# Patient Record
Sex: Male | Born: 1985 | Race: Black or African American | Hispanic: No | Marital: Single | Smoking: Former smoker
Health system: Southern US, Community
[De-identification: ages and names within clinical notes are randomized; demographics above are authoritative.]

## PROBLEM LIST (undated history)

## (undated) DIAGNOSIS — Z992 Dependence on renal dialysis: Secondary | ICD-10-CM

## (undated) DIAGNOSIS — J810 Acute pulmonary edema: Secondary | ICD-10-CM

## (undated) DIAGNOSIS — J189 Pneumonia, unspecified organism: Secondary | ICD-10-CM

## (undated) DIAGNOSIS — G9341 Metabolic encephalopathy: Secondary | ICD-10-CM

## (undated) DIAGNOSIS — I10 Essential (primary) hypertension: Secondary | ICD-10-CM

## (undated) DIAGNOSIS — D631 Anemia in chronic kidney disease: Secondary | ICD-10-CM

## (undated) DIAGNOSIS — F329 Major depressive disorder, single episode, unspecified: Secondary | ICD-10-CM

## (undated) DIAGNOSIS — K5790 Diverticulosis of intestine, part unspecified, without perforation or abscess without bleeding: Secondary | ICD-10-CM

## (undated) DIAGNOSIS — N62 Hypertrophy of breast: Secondary | ICD-10-CM

## (undated) DIAGNOSIS — G9340 Encephalopathy, unspecified: Secondary | ICD-10-CM

## (undated) DIAGNOSIS — K219 Gastro-esophageal reflux disease without esophagitis: Secondary | ICD-10-CM

## (undated) DIAGNOSIS — R161 Splenomegaly, not elsewhere classified: Secondary | ICD-10-CM

## (undated) DIAGNOSIS — K449 Diaphragmatic hernia without obstruction or gangrene: Secondary | ICD-10-CM

## (undated) DIAGNOSIS — Z91199 Patient's noncompliance with other medical treatment and regimen due to unspecified reason: Secondary | ICD-10-CM

## (undated) DIAGNOSIS — I509 Heart failure, unspecified: Secondary | ICD-10-CM

## (undated) DIAGNOSIS — Z720 Tobacco use: Secondary | ICD-10-CM

## (undated) DIAGNOSIS — R0600 Dyspnea, unspecified: Secondary | ICD-10-CM

## (undated) DIAGNOSIS — J90 Pleural effusion, not elsewhere classified: Secondary | ICD-10-CM

## (undated) DIAGNOSIS — I5189 Other ill-defined heart diseases: Secondary | ICD-10-CM

## (undated) DIAGNOSIS — F1290 Cannabis use, unspecified, uncomplicated: Secondary | ICD-10-CM

## (undated) DIAGNOSIS — D649 Anemia, unspecified: Secondary | ICD-10-CM

## (undated) DIAGNOSIS — R0609 Other forms of dyspnea: Secondary | ICD-10-CM

## (undated) DIAGNOSIS — J9601 Acute respiratory failure with hypoxia: Secondary | ICD-10-CM

## (undated) DIAGNOSIS — T8859XA Other complications of anesthesia, initial encounter: Secondary | ICD-10-CM

## (undated) DIAGNOSIS — Z9981 Dependence on supplemental oxygen: Secondary | ICD-10-CM

## (undated) DIAGNOSIS — I5030 Unspecified diastolic (congestive) heart failure: Secondary | ICD-10-CM

## (undated) DIAGNOSIS — Z789 Other specified health status: Secondary | ICD-10-CM

## (undated) DIAGNOSIS — N186 End stage renal disease: Secondary | ICD-10-CM

## (undated) HISTORY — DX: Heart failure, unspecified: I50.9

## (undated) HISTORY — PX: AV FISTULA PLACEMENT: SHX1204

---

## 2020-09-16 ENCOUNTER — Emergency Department: Payer: BC Managed Care – PPO

## 2020-09-16 ENCOUNTER — Observation Stay
Admission: EM | Admit: 2020-09-16 | Discharge: 2020-09-16 | Disposition: A | Discharge status: Home / Self Care | Payer: BC Managed Care – PPO | Attending: Internal Medicine | Admitting: Internal Medicine

## 2020-09-16 ENCOUNTER — Encounter: Payer: Self-pay | Admitting: Emergency Medicine

## 2020-09-16 ENCOUNTER — Other Ambulatory Visit: Payer: Self-pay

## 2020-09-16 DIAGNOSIS — J9601 Acute respiratory failure with hypoxia: Secondary | ICD-10-CM

## 2020-09-16 DIAGNOSIS — I161 Hypertensive emergency: Secondary | ICD-10-CM | POA: Diagnosis not present

## 2020-09-16 DIAGNOSIS — R778 Other specified abnormalities of plasma proteins: Secondary | ICD-10-CM

## 2020-09-16 DIAGNOSIS — R7402 Elevation of levels of lactic acid dehydrogenase (LDH): Secondary | ICD-10-CM | POA: Insufficient documentation

## 2020-09-16 DIAGNOSIS — J9621 Acute and chronic respiratory failure with hypoxia: Secondary | ICD-10-CM | POA: Diagnosis not present

## 2020-09-16 DIAGNOSIS — R748 Abnormal levels of other serum enzymes: Secondary | ICD-10-CM | POA: Diagnosis not present

## 2020-09-16 DIAGNOSIS — I10 Essential (primary) hypertension: Secondary | ICD-10-CM

## 2020-09-16 DIAGNOSIS — I120 Hypertensive chronic kidney disease with stage 5 chronic kidney disease or end stage renal disease: Secondary | ICD-10-CM | POA: Insufficient documentation

## 2020-09-16 DIAGNOSIS — N186 End stage renal disease: Secondary | ICD-10-CM | POA: Insufficient documentation

## 2020-09-16 DIAGNOSIS — Z79899 Other long term (current) drug therapy: Secondary | ICD-10-CM | POA: Insufficient documentation

## 2020-09-16 DIAGNOSIS — Z20822 Contact with and (suspected) exposure to covid-19: Secondary | ICD-10-CM | POA: Diagnosis not present

## 2020-09-16 DIAGNOSIS — Z5329 Procedure and treatment not carried out because of patient's decision for other reasons: Secondary | ICD-10-CM | POA: Diagnosis not present

## 2020-09-16 DIAGNOSIS — Z992 Dependence on renal dialysis: Secondary | ICD-10-CM | POA: Insufficient documentation

## 2020-09-16 DIAGNOSIS — F17210 Nicotine dependence, cigarettes, uncomplicated: Secondary | ICD-10-CM | POA: Insufficient documentation

## 2020-09-16 DIAGNOSIS — R7989 Other specified abnormal findings of blood chemistry: Secondary | ICD-10-CM

## 2020-09-16 DIAGNOSIS — J810 Acute pulmonary edema: Secondary | ICD-10-CM | POA: Diagnosis not present

## 2020-09-16 DIAGNOSIS — I248 Other forms of acute ischemic heart disease: Secondary | ICD-10-CM | POA: Diagnosis not present

## 2020-09-16 DIAGNOSIS — R0603 Acute respiratory distress: Secondary | ICD-10-CM | POA: Diagnosis present

## 2020-09-16 HISTORY — DX: Essential (primary) hypertension: I10

## 2020-09-16 HISTORY — DX: Dependence on renal dialysis: N18.6

## 2020-09-16 HISTORY — DX: End stage renal disease: Z99.2

## 2020-09-16 LAB — BLOOD GAS, VENOUS
Acid-base deficit: 4.7 mmol/L — ABNORMAL HIGH (ref 0.0–2.0)
Bicarbonate: 22.1 mmol/L (ref 20.0–28.0)
Delivery systems: POSITIVE
FIO2: 100
O2 Saturation: 81.3 %
Patient temperature: 37
pCO2, Ven: 47 mmHg (ref 44.0–60.0)
pH, Ven: 7.28 (ref 7.250–7.430)
pO2, Ven: 52 mmHg — ABNORMAL HIGH (ref 32.0–45.0)

## 2020-09-16 LAB — COMPREHENSIVE METABOLIC PANEL
ALT: 11 U/L (ref 0–44)
AST: 21 U/L (ref 15–41)
Albumin: 4.1 g/dL (ref 3.5–5.0)
Alkaline Phosphatase: 54 U/L (ref 38–126)
Anion gap: 17 — ABNORMAL HIGH (ref 5–15)
BUN: 64 mg/dL — ABNORMAL HIGH (ref 6–20)
CO2: 22 mmol/L (ref 22–32)
Calcium: 8.5 mg/dL — ABNORMAL LOW (ref 8.9–10.3)
Chloride: 101 mmol/L (ref 98–111)
Creatinine, Ser: 14.23 mg/dL — ABNORMAL HIGH (ref 0.61–1.24)
GFR, Estimated: 4 mL/min — ABNORMAL LOW (ref >60)
Glucose, Bld: 105 mg/dL — ABNORMAL HIGH (ref 70–99)
Potassium: 4.3 mmol/L (ref 3.5–5.1)
Sodium: 140 mmol/L (ref 135–145)
Total Bilirubin: 0.9 mg/dL (ref 0.3–1.2)
Total Protein: 8.3 g/dL — ABNORMAL HIGH (ref 6.5–8.1)

## 2020-09-16 LAB — CBC WITH DIFFERENTIAL/PLATELET
Abs Immature Granulocytes: 0.05 10*3/uL (ref 0.00–0.07)
Basophils Absolute: 0.1 10*3/uL (ref 0.0–0.1)
Basophils Relative: 1 %
Eosinophils Absolute: 0.4 10*3/uL (ref 0.0–0.5)
Eosinophils Relative: 3 %
HCT: 37.8 % — ABNORMAL LOW (ref 39.0–52.0)
Hemoglobin: 12.2 g/dL — ABNORMAL LOW (ref 13.0–17.0)
Immature Granulocytes: 0 %
Lymphocytes Relative: 9 %
Lymphs Abs: 1.3 10*3/uL (ref 0.7–4.0)
MCH: 30.8 pg (ref 26.0–34.0)
MCHC: 32.3 g/dL (ref 30.0–36.0)
MCV: 95.5 fL (ref 80.0–100.0)
Monocytes Absolute: 0.9 10*3/uL (ref 0.1–1.0)
Monocytes Relative: 6 %
Neutro Abs: 11.1 10*3/uL — ABNORMAL HIGH (ref 1.7–7.7)
Neutrophils Relative %: 81 %
Platelets: 241 10*3/uL (ref 150–400)
RBC: 3.96 MIL/uL — ABNORMAL LOW (ref 4.22–5.81)
RDW: 14.3 % (ref 11.5–15.5)
WBC: 13.9 10*3/uL — ABNORMAL HIGH (ref 4.0–10.5)
nRBC: 0 % (ref 0.0–0.2)

## 2020-09-16 LAB — PROTIME-INR
INR: 1.1 (ref 0.8–1.2)
Prothrombin Time: 14.1 seconds (ref 11.4–15.2)

## 2020-09-16 LAB — BRAIN NATRIURETIC PEPTIDE: B Natriuretic Peptide: >4500 pg/mL — ABNORMAL HIGH (ref 0.0–100.0)

## 2020-09-16 LAB — LIPASE, BLOOD: Lipase: 36 U/L (ref 11–51)

## 2020-09-16 LAB — RESP PANEL BY RT-PCR (FLU A&B, COVID) ARPGX2
Influenza A by PCR: NEGATIVE
Influenza B by PCR: NEGATIVE
SARS Coronavirus 2 by RT PCR: NEGATIVE

## 2020-09-16 LAB — TROPONIN I (HIGH SENSITIVITY): Troponin I (High Sensitivity): 196 ng/L (ref <18)

## 2020-09-16 LAB — MAGNESIUM: Magnesium: 2.3 mg/dL (ref 1.7–2.4)

## 2020-09-16 LAB — LACTIC ACID, PLASMA: Lactic Acid, Venous: 2.5 mmol/L (ref 0.5–1.9)

## 2020-09-16 MED ORDER — NITROGLYCERIN IN D5W 200-5 MCG/ML-% IV SOLN
0.0000 ug/min | INTRAVENOUS | Status: DC
  Administered 2020-09-16: 100 ug/min via INTRAVENOUS
  Filled 2020-09-16: qty 250

## 2020-09-16 MED ORDER — AMLODIPINE BESYLATE 5 MG PO TABS: 10.0000 mg | ORAL_TABLET | Freq: Every day | ORAL | Status: DC

## 2020-09-16 MED ORDER — HYDRALAZINE HCL 50 MG PO TABS
100.0000 mg | ORAL_TABLET | Freq: Three times a day (TID) | ORAL | Status: DC
  Administered 2020-09-16: 100 mg via ORAL
  Filled 2020-09-16: qty 2

## 2020-09-16 MED ORDER — HEPARIN SODIUM (PORCINE) 5000 UNIT/ML IJ SOLN: 5000.0000 [IU] | Freq: Three times a day (TID) | INTRAMUSCULAR | Status: DC

## 2020-09-16 MED ORDER — HEPARIN SODIUM (PORCINE) 1000 UNIT/ML IJ SOLN
1000.0000 [IU] | Freq: Once | INTRAMUSCULAR | Status: AC
  Administered 2020-09-16: 1000 [IU] via INTRAVENOUS
  Filled 2020-09-16: qty 1

## 2020-09-16 MED ORDER — CARVEDILOL 6.25 MG PO TABS: 6.2500 mg | ORAL_TABLET | Freq: Two times a day (BID) | ORAL | Status: DC

## 2020-09-16 MED ORDER — ACETAMINOPHEN 650 MG RE SUPP: 650.0000 mg | Freq: Four times a day (QID) | RECTAL | Status: DC | PRN

## 2020-09-16 MED ORDER — ACETAMINOPHEN 325 MG PO TABS
650.0000 mg | ORAL_TABLET | Freq: Four times a day (QID) | ORAL | Status: DC | PRN
  Administered 2020-09-16: 650 mg via ORAL
  Filled 2020-09-16: qty 2

## 2020-09-16 MED ORDER — ONDANSETRON HCL 4 MG PO TABS: 4.0000 mg | ORAL_TABLET | Freq: Four times a day (QID) | ORAL | Status: DC | PRN

## 2020-09-16 MED ORDER — ONDANSETRON HCL 4 MG/2ML IJ SOLN: 4.0000 mg | Freq: Four times a day (QID) | INTRAMUSCULAR | Status: DC | PRN

## 2020-09-16 MED ORDER — CALCIUM ACETATE (PHOS BINDER) 667 MG PO CAPS
1334.0000 mg | ORAL_CAPSULE | Freq: Three times a day (TID) | ORAL | Status: DC
  Filled 2020-09-16 (×3): qty 2

## 2020-09-16 MED ORDER — LOSARTAN POTASSIUM 50 MG PO TABS: 100.0000 mg | ORAL_TABLET | Freq: Every day | ORAL | Status: DC

## 2020-09-16 MED ORDER — CHLORHEXIDINE GLUCONATE CLOTH 2 % EX PADS
6.0000 | MEDICATED_PAD | Freq: Every day | CUTANEOUS | Status: DC
  Filled 2020-09-16 (×2): qty 6

## 2020-09-16 MED ORDER — HEPARIN SODIUM (PORCINE) 1000 UNIT/ML DIALYSIS
1000.0000 [IU] | INTRAMUSCULAR | Status: DC | PRN
  Filled 2020-09-16: qty 4
  Filled 2020-09-16 (×2): qty 1

## 2020-09-16 MED ORDER — NICARDIPINE HCL IN NACL 20-0.86 MG/200ML-% IV SOLN
3.0000 mg/h | INTRAVENOUS | Status: DC
  Administered 2020-09-16: 5 mg/h via INTRAVENOUS
  Filled 2020-09-16 (×2): qty 200

## 2020-09-16 NOTE — Progress Notes (Signed)
Pre HD RN assessment 

## 2020-09-16 NOTE — Progress Notes (Signed)
Hd start 

## 2020-09-16 NOTE — ED Triage Notes (Addendum)
Pt presents to the ER in Respiratory Distress. Per EMS, pt reporting SOB prior to dialysis. Pt Room air 77%. Pt arrival diaphoretic, tachypneic and tachycardiac.

## 2020-09-16 NOTE — ED Notes (Signed)
To room to replace monitor and BP - pt asking when he is going upstairs, pt informed that there are no available beds.  PT states "then I'll just go home."  When pt reminded that he is on a medication by IV to control his BP, she states "I can go to another hospital for that."  Pt informed that most hospitals are in the same situation.  Pt requesting paper and pen.  Informed that I would provide for him as soon as I could.

## 2020-09-16 NOTE — ED Notes (Signed)
Report given to Jennifer, RN

## 2020-09-16 NOTE — H&P (Addendum)
History and Physical   Travis Long [id number] DOB: 1986-02-19 DOA: 09/16/2020  PCP: Vivi Barrack, MD  Outpatient Specialists: Dr. Serafina Royals, [facility] Patient coming from: home   I have personally briefly reviewed patient's old medical records in [place].  Chief Concern: Shortness of breath  HPI: Travis Long is a 35 y.o. male with medical history significant for ESRD, hypertension, presents to [facility] ED for chief concern of shortness of breath.   Patient reports that the shortness of breath and respiratory distress woke him up at approximately 2 AM.  He reports that he still managed to attempt to go to dialysis.  However at dialysis, he was in so much severe distress that they called 911.  Upon arrival, it was noted that patient had SPO2 of 77% on room air.  Per documentation, he was in distress, tripoding, panicking, very diaphoretic, and reporting chest pain at the time.  He was placed on nonrebreather with with improvement to 99%.  He was not able to initially communicate with ED provider.  ED provider initiated BiPAP. Per documentation, he improved significantly after BiPAP and no longer endorsing chest pain.  At bedside he was able to mumble his name and his age. He reports that he is compliant with dialysis.  Social history: lives with mother. He works Theatre manager a Neurosurgeon. He denies tobacco, etoh, recreational drug use.   Vaccinations: he is not vaccinated for covid 19  ROS: Unable to complete due to patient acuity.  ED Course: Discussed with ED provider.  Vitals in the emergency department was remarkable for 97.6, increased respiration rate, 29, heart rate 148, blood pressure 138/96, elevated blood pressure two 210/146, SPO2 100% on BiPAP.  Assessment/Plan  Active Problems:   Acute hypoxemic respiratory failure (HCC)    Acute hypoxemic respiratory failure Chest pain - Suspect secondary to volume overload in setting of end-stage renal disease  on hemodialysis.  However as patient endorsed chest pain, ACS cannot be excluded - Echo ordered -Continue BiPAP - Nephrology was consulted and patient will get dialysis same day -CBC, BMP in the a.m.  Patient left AGAINST MEDICAL ADVICE.  When I got to the room to evaluate and discussed with him regarding his risk of leaving [place], he was already gone.  Chart reviewed.   DVT prophylaxis: Heparin 5000 units every 8 hours Code Status: full code Diet: Renal/carb modified Family Communication: No Disposition Plan: Pending clinical course Consults called: Nephrology Admission status: stepdown, cardiac monitoring  Past Medical History:  Diagnosis Date   ESRD on hemodialysis ([place])    Hypertension    Past Surgical History:  Procedure Laterality Date   AV FISTULA PLACEMENT Left    Social History:  reports that he has been smoking cigarettes. He has never used smokeless tobacco. No history on file for alcohol use and drug use.  No Known Allergies History reviewed. No pertinent family history. Family history: Family history reviewed and not pertinent  Prior to Admission medications   Medication Sig Start Date End Date Taking? Authorizing Provider  amLODipine (NORVASC) 10 MG tablet Take 10 mg by mouth daily. 10/14/19   [provider]  calcium acetate (PHOSLO) 667 MG capsule Take 1,334 mg by mouth 3 (three) times daily. 06/01/20   [provider]  carvedilol (COREG) 6.25 MG tablet Take 6.25 mg by mouth in the morning and at bedtime. 06/22/20   [provider]  losartan (COZAAR) 100 MG tablet Take 100 mg by mouth daily. 08/26/20  [provider]   Physical Exam: Vitals:   09/16/20 0639 09/16/20 0654 09/16/20 0700 09/16/20 0719  BP: (!) 211/153 (!) 209/151 (!) 206/150 (!) 210/146  Pulse: (!) 112 (!) 110 (!) 108 (!) 105  Resp: (!) 40  (!) 42   Temp:      TempSrc:      SpO2: 98%  100% 100%  Weight:      Height:       Constitutional:  appears age-appropriate, NAD, calm, comfortable Eyes: PERRL, lids and conjunctivae normal ENMT: Mucous membranes are moist. Posterior pharynx clear of any exudate or lesions. Age-appropriate dentition. Hearing appropriate Neck: normal, supple, no masses, no thyromegaly Respiratory: clear to auscultation bilaterally, no wheezing, no crackles. Normal respiratory effort. No accessory muscle use.  Cardiovascular: Regular rate and rhythm, no murmurs / rubs / gallops. No extremity edema. 2+ pedal pulses. No carotid bruits.  Abdomen: no tenderness, no masses palpated, no hepatosplenomegaly. Bowel sounds positive.  Musculoskeletal: no clubbing / cyanosis. No joint deformity upper and lower extremities. Good ROM, no contractures, no atrophy. Normal muscle tone.  Skin: no rashes, lesions, ulcers. No induration Neurologic: Sensation intact. Strength 5/5 in all 4.  Psychiatric: Normal judgment and insight. Alert and oriented x 3. Normal mood.   EKG: independently reviewed, showing sinus tachycardia with rate of 123, QTc 471 ST depression in V5, V6  Chest x-ray on Admission: I personally reviewed and I agree with radiologist reading as below.  DG Chest Portable 1 View  Result Date: 09/16/2020 CLINICAL DATA:  Acute severe dyspnea. EXAM: PORTABLE CHEST 1 VIEW COMPARISON:  None. FINDINGS: Right chest wall dialysis catheter with tip overlying the right atrium. Poorly visualized likely enlarged cardiac silhouette. Increased interstitial markings. Airspace opacities within the lower lungs. At least small volume bilateral pleural effusions, left greater than right. No pneumothorax. No acute osseous abnormality. IMPRESSION: Findings suggestive of pulmonary edema/ARDS with at least small volume bilateral pleural effusions, left greater than right. Superimposed infection/inflammation not excluded. Electronically Signed   By: Iven Finn M.D.   On: 09/16/2020 06:37    Labs on Admission: I have personally reviewed  following labs  CBC: Recent Labs  Lab 09/16/20 0609  WBC 13.9*  NEUTROABS 11.1*  HGB 12.2*  HCT 37.8*  MCV 95.5  PLT [id number]   Basic Metabolic Panel: No results for input(s): NA, K, CL, CO2, GLUCOSE, BUN, CREATININE, CALCIUM, MG, PHOS in the last 168 hours.  GFR: CrCl cannot be calculated (No successful lab value found.).  Coagulation Profile: Recent Labs  Lab 09/16/20 0609  INR 1.1   CRITICAL CARE Performed by: Travis Long  Total critical care time: 35 minutes  Critical care time was exclusive of separately billable procedures and treating other patients.  Critical care was necessary to treat or prevent imminent or life-threatening deterioration.  Acute hypoxemic respiratory failure.  Circulatory failure.  Critical care was time spent personally by me on the following activities: development of treatment plan with patient and/or surrogate as well as nursing, discussions with consultants, evaluation of patient's response to treatment, examination of patient, obtaining history from patient or surrogate, ordering and performing treatments and interventions, ordering and review of laboratory studies, ordering and review of radiographic studies, pulse oximetry and re-evaluation of patient's condition.  Dr. Tobie Poet [facility]  If 7PM-7AM, please contact overnight-coverage provider If 7AM-7PM, please contact day coverage provider [email or web address]  09/16/2020, 7:49 AM

## 2020-09-16 NOTE — ED Notes (Signed)
Lab to bedside for blood work, pt refusing at this time, Dr. Tobie Poet aware.

## 2020-09-16 NOTE — ED Notes (Signed)
Pt removed bipap, states he would like to d/c so he can eat breakfast. Will consult MD and RT.

## 2020-09-16 NOTE — ED Notes (Signed)
MD and RT at bedside upon arrival.

## 2020-09-16 NOTE — ED Notes (Signed)
ED Provider at bedside. 

## 2020-09-16 NOTE — ED Notes (Signed)
AMA formed signed by pt and myself and RN Anderson Malta

## 2020-09-16 NOTE — ED Notes (Signed)
Dialysis MD at bedside, state he will be contacting Dr. Tobie Poet to reduce pt's nitro drip. Awaiting orders.

## 2020-09-16 NOTE — ED Provider Notes (Signed)
[facility] Emergency Department Provider Note  ____________________________________________   Event Date/Time   First MD Initiated Contact with Patient 09/16/20 [phone]     (approximate)  I have reviewed the triage vital signs and the nursing notes.   HISTORY  Chief Complaint Respiratory Distress  Level 5 caveat:  history/ROS limited by acute/critical illness  HPI Travis Long is a 35 y.o. male with end-stage renal disease on hemodialysis Mondays, Wednesdays, and Fridays who presents by EMS with severe respiratory distress.  He says that he has not missed any dialysis sessions.  He woke up this morning at about 2 AM and felt short of breath.  This worsened gradually until he got to dialysis and he was in enough distress of dialysis that they immediately called 911.  He was noted to be 77% SPO2 on room air when first responders arrived.  He was in severe distress, tripoding, panicking, very diaphoretic, and reporting chest pain.  They placed him on a nonrebreather which brought him up to 99% but he was still in severe distress upon arrival, unable to speak more than a few words, continue to be very diaphoretic and reporting chest pain and shortness of breath.  No recent fever and he denies missing any dialysis.     Past Medical History:  Diagnosis Date   ESRD on hemodialysis [facility])    Hypertension     Patient Active Problem List   Diagnosis Date Noted   Acute hypoxemic respiratory failure ([place]) 09/16/2020     Past Surgical History:  Procedure Laterality Date   AV FISTULA PLACEMENT Left     Prior to Admission medications   Medication Sig Start Date End Date Taking? Authorizing Provider  amLODipine (NORVASC) 10 MG tablet Take 10 mg by mouth daily. 10/14/19   [provider]  calcium acetate (PHOSLO) 667 MG capsule Take 1,334 mg by mouth 3 (three) times daily. 06/01/20   [provider]  carvedilol (COREG) 6.25 MG tablet Take 6.25 mg by  mouth in the morning and at bedtime. 06/22/20   [provider]  losartan (COZAAR) 100 MG tablet Take 100 mg by mouth daily. 08/26/20   [provider]    Allergies Patient has no known allergies.  History reviewed. No pertinent family history.  Social History Social History   Tobacco Use   Smoking status: Every Day    Pack years: 0.00    Types: Cigarettes   Smokeless tobacco: Never    Review of Systems Level 5 caveat:  history/ROS limited by acute/critical illness   ____________________________________________   PHYSICAL EXAM:  VITAL SIGNS: ED Triage Vitals  Enc Vitals Group     BP 09/16/20 0558 (!) 138/96     Pulse Rate 09/16/20 0558 (!) 148     Resp 09/16/20 0558 (!) 60     Temp 09/16/20 0613 97.6 F (36.4 C)     Temp Source 09/16/20 0613 Axillary     SpO2 09/16/20 0558 100 %     Weight 09/16/20 0611 90.7 kg (200 lb)     Height 09/16/20 0611 1.727 m ('5\' 8"'$ )     Head Circumference --      Peak Flow --      Pain Score --      Pain Loc --      Pain Edu? --      Excl. in [place]? --     Constitutional: Alert and oriented.  Severe respiratory distress. Eyes: Conjunctivae are normal.  Head:  Atraumatic. Nose: No congestion/rhinnorhea. Mouth/Throat: Patient is wearing a mask. Neck: No stridor.  No meningeal signs.   Cardiovascular: Tachycardia, regular rhythm. Good peripheral circulation. Respiratory: Tachypnea, coarse breath sounds throughout. Gastrointestinal: Soft and nontender. No distention.  Musculoskeletal: No lower extremity tenderness nor edema. No gross deformities of extremities. Neurologic:  No gross focal neurologic deficits are appreciated.  Moving all 4 extremities without any difficulty.  Difficult to participate in neurological exam given respiratory distress. Skin:  Skin is warm, heavily diaphoretic, and intact.   ____________________________________________   LABS (all labs ordered are listed, but only abnormal results are  displayed)  Labs Reviewed  BRAIN NATRIURETIC PEPTIDE - Abnormal; Notable for the following components:      Result Value   B Natriuretic Peptide >4,500.0 (*)    All other components within normal limits  CBC WITH DIFFERENTIAL/PLATELET - Abnormal; Notable for the following components:   WBC 13.9 (*)    RBC 3.96 (*)    Hemoglobin 12.2 (*)    HCT 37.8 (*)    Neutro Abs 11.1 (*)    All other components within normal limits  BLOOD GAS, VENOUS - Abnormal; Notable for the following components:   pO2, Ven 52.0 (*)    Acid-base deficit 4.7 (*)    All other components within normal limits  LACTIC ACID, PLASMA - Abnormal; Notable for the following components:   Lactic Acid, Venous 2.5 (*)    All other components within normal limits  COMPREHENSIVE METABOLIC PANEL - Abnormal; Notable for the following components:   Glucose, Bld 105 (*)    BUN 64 (*)    Creatinine, Ser 14.23 (*)    Calcium 8.5 (*)    Total Protein 8.3 (*)    GFR, Estimated 4 (*)    Anion gap 17 (*)    All other components within normal limits  TROPONIN I (HIGH SENSITIVITY) - Abnormal; Notable for the following components:   Troponin I (High Sensitivity) 196 (*)    All other components within normal limits  RESP PANEL BY RT-PCR (FLU A&B, COVID) ARPGX2  PROTIME-INR  LIPASE, BLOOD  MAGNESIUM  LACTIC ACID, PLASMA  HIV ANTIBODY (ROUTINE TESTING W REFLEX)  HEPATITIS B SURFACE ANTIBODY,QUALITATIVE  HEPATITIS B SURFACE ANTIGEN  TROPONIN I (HIGH SENSITIVITY)   ____________________________________________  EKG  ED ECG REPORT I, Hinda Kehr, the attending physician, personally viewed and interpreted this ECG.  Date: 09/16/2020 EKG Time: 6:05 AM Rate: 123 Rhythm: Sinus tachycardia QRS Axis: normal Intervals: normal ST/T Wave abnormalities: Some ST depression laterally with possible ST elevation in the anterior leads but this also could be J-point elevation. Narrative Interpretation: Probable demand ischemia secondary  to respiratory failure, does not meet STEMI criteria. ____________________________________________  RADIOLOGY I, Hinda Kehr, personally viewed and evaluated these images (plain radiographs) as part of my medical decision making, as well as reviewing the written report by the radiologist.  ED MD interpretation: Acute pulmonary edema  Official radiology report(s): DG Chest Portable 1 View  Result Date: 09/16/2020 CLINICAL DATA:  Acute severe dyspnea. EXAM: PORTABLE CHEST 1 VIEW COMPARISON:  None. FINDINGS: Right chest wall dialysis catheter with tip overlying the right atrium. Poorly visualized likely enlarged cardiac silhouette. Increased interstitial markings. Airspace opacities within the lower lungs. At least small volume bilateral pleural effusions, left greater than right. No pneumothorax. No acute osseous abnormality. IMPRESSION: Findings suggestive of pulmonary edema/ARDS with at least small volume bilateral pleural effusions, left greater than right. Superimposed infection/inflammation not excluded. Electronically Signed   By:  Iven Finn M.D.   On: 09/16/2020 06:37    ____________________________________________   PROCEDURES   Procedure(s) performed (including Critical Care):  .Critical Care  Date/Time: 09/16/2020 7:23 AM Performed by: Hinda Kehr, MD Authorized by: Hinda Kehr, MD   Critical care provider statement:    Critical care time (minutes):  60   Critical care time was exclusive of:  Separately billable procedures and treating other patients   Critical care was necessary to treat or prevent imminent or life-threatening deterioration of the following conditions:  Cardiac failure and respiratory failure   Critical care was time spent personally by me on the following activities:  Development of treatment plan with patient or surrogate, discussions with consultants, evaluation of patient's response to treatment, examination of patient, obtaining history from  patient or surrogate, ordering and performing treatments and interventions, ordering and review of laboratory studies, ordering and review of radiographic studies, pulse oximetry, re-evaluation of patient's condition and review of old charts .1-3 Lead EKG Interpretation  Date/Time: 09/16/2020 7:23 AM Performed by: Hinda Kehr, MD Authorized by: Hinda Kehr, MD     Interpretation: abnormal     ECG rate:  110   ECG rate assessment: tachycardic     Rhythm: sinus rhythm     Ectopy: none     Conduction: normal     ____________________________________________   INITIAL IMPRESSION / MDM / ASSESSMENT AND PLAN / ED COURSE  As part of my medical decision making, I reviewed the following data within the [place] notes reviewed and incorporated, Labs reviewed , EKG interpreted , Old chart reviewed, Radiograph reviewed , Discussed with admitting physician , Discussed with nephrologist (Dr. Juleen China), and Notes from prior ED visits   Differential diagnosis includes, but is not limited to, malignant hypertension, flash pulmonary edema, electrolyte or metabolic abnormality, acute infection, ACS, PE.  The patient is on the cardiac monitor to evaluate for evidence of arrhythmia and/or significant heart rate changes.  Anticipate elevated blood pressure but the initial measurement was normal.  We will recheck to make sure we have accurate settings.  Patient in severe distress upon arrival and I gave the order to put him on BiPAP.  He tolerated it well and his breathing has already improved, decreased work of breathing and he is also visibly trying to calm himself down and take slower breaths when encouraged to do so.  Doubt infectious process as well as ACS or PE.  Patient is presenting as classic flash pulmonary edema in the setting of end-stage renal disease.  Hold off until I see the chest x-ray before starting any diuretics and I will need to see an accurate blood pressure  before I start medication such as nitroglycerin, but I anticipate will be necessary.  COVID swab pending as well.  Patient is coming down significantly on BiPAP and does not need to be intubated at this time.     Clinical Course as of 09/16/20 [id number]  Fri Sep 16, 2020  [id number] Patient has improved substantially on BiPAP, he is no longer panicking, he is satting 99 to 100% and is starting to slow his breathing.  He is no longer reporting pain. [CF]  0627 Believe that the initial normal blood pressure that was obtained was erroneous.  We have rechecked multiple blood pressure since then and they are all in the 240/160 range which is consistent with his presentation.  I am going to initiate a nitroglycerin infusion at a rate of 100 mcg/min to try  to drop his preload. [CF]  0627 VBG generally reassuring other than hypoxia, no CO2 retention, essentially normal pH [CF]  0628 DG Chest Portable 1 View I personally reviewed the chest x-ray and I believe it is consistent with pulmonary edema.  I am awaiting the official word from radiology. [CF]  [id number] CBC with Differential(!) CBC essentially normal, mild leukocytosis of unclear clinical significance. [CF]  0708 Patient continues to do well on BiPAP.  Blood pressure coming down slowly on nitro drip, but is still elevated and we will continue to titrate.  COVID test negative.  Demand ischemia with positive troponin of 196.  BNP greater than 4500.  I am calling nephrology to let them know he will need urgent if not emergent dialysis.  Radiology confirms the pulmonary edema diagnosis I saw previously.   [CF]  0712 Lactic Acid, Venous(!!): 2.5 The patient is noted to have a lactate>2. With the current information available to me, I don't think the patient is in septic shock. The lactate>4, is related to respiratory distress/ respiratory failure  [CF]  [phone] I called by phone and discussed the case with Dr. Juleen China and he knows the need for urgent dialysis.  I then  consulted the hospitalist because I believe the patient is appropriate for the hospitalist service on stepdown level of care since the definitive treatment will be dialysis. [CF]  [id number] I discussed the case by phone with Dr. Rupert Stacks.  She agrees with the plan to admit to the hospitalist service. [CF]    Clinical Course User Index [CF] Hinda Kehr, MD     ____________________________________________  FINAL CLINICAL IMPRESSION(S) / ED DIAGNOSES  Final diagnoses:  Acute respiratory failure with hypoxemia ([place])  Acute pulmonary edema ([place])  Hypertensive emergency  ESRD on hemodialysis ([place])  Demand ischemia ([place])  Elevated troponin level  Elevated lactic acid level     MEDICATIONS GIVEN DURING THIS VISIT:  Medications  nitroGLYCERIN 50 mg in dextrose 5 % 250 mL (0.2 mg/mL) infusion (150 mcg/min Intravenous Rate/Dose Change 09/16/20 0720)  amLODipine (NORVASC) tablet 10 mg (has no administration in time range)  carvedilol (COREG) tablet 6.25 mg (has no administration in time range)  losartan (COZAAR) tablet 100 mg (has no administration in time range)  calcium acetate (PHOSLO) capsule 1,334 mg (has no administration in time range)  acetaminophen (TYLENOL) tablet 650 mg (has no administration in time range)    Or  acetaminophen (TYLENOL) suppository 650 mg (has no administration in time range)  ondansetron (ZOFRAN) tablet 4 mg (has no administration in time range)    Or  ondansetron (ZOFRAN) injection 4 mg (has no administration in time range)  heparin injection 5,000 Units (has no administration in time range)  Chlorhexidine Gluconate Cloth 2 % PADS 6 each (has no administration in time range)     ED Discharge Orders     None        Note:  This document was prepared using Dragon voice recognition software and may include unintentional dictation errors.   Hinda Kehr, MD 09/16/20 [phone]

## 2020-09-16 NOTE — ED Notes (Signed)
Attempted Blood draw, unsuccessful , Pt refused further blood draw.  RN Anderson Malta advised

## 2020-09-16 NOTE — Progress Notes (Signed)
Pt HD tx end, alert, diaphoretic, c/o of being hot and mild headache, 2.9L fluid removed. Tx ended 1 minute early d/t leg cramps. Report to primary ED RN, pt catheter ports locked w/ 1.11m heparin each. Pt  currently eating lunch and has no further c/o of leg cramps.

## 2020-09-16 NOTE — ED Notes (Signed)
Pt refused last set of vital signs

## 2020-09-16 NOTE — Progress Notes (Signed)
[facility]  ROUNDING NOTE   Subjective:   Travis Long is a 35 y.o. male with past medical history of hypertension and ESRD on dialysis. He presents to ED this morning after waking with shortness of breath. He presented to his dialysis center, who sent him to ED for this compliant.   Patient was seen this morning on Bipap. Unable to answer questions. HD RN at bedside to perform dialysis. Patient seen later with Bipap off. Denies missed dialysis treatments. Admits to not taking hypertensive medications as prescribed. Feels it wasn't working. Denies nausea, vomiting and diarrhea.    Objective:  Vital signs in last 24 hours:  Temp:  [97.6 F (36.4 C)-98.3 F (36.8 C)] 98.3 F (36.8 C) (06/24 1243) Pulse Rate:  [68-148] 68 (06/24 1330) Resp:  [17-60] 32 (06/24 1330) BP: (138-240)/(96-163) 184/106 (06/24 1332) SpO2:  [84 %-100 %] 84 % (06/24 1330) Weight:  [90.7 kg] 90.7 kg (06/24 0611)  Weight change:  Filed Weights   09/16/20 0611  Weight: 90.7 kg    Intake/Output: No intake/output data recorded.   Intake/Output this shift:  Total I/O In: 201.7 [I.V.:201.7] Out: 2934 [Other:2934]  Physical Exam: General: NAD,  sitting up in bed  Head: Normocephalic, atraumatic. Moist oral mucosal membranes  Eyes: Anicteric  Lungs:  Clear to auscultation, O2 4L  Heart: Regular rate and rhythm  Abdomen:  Soft, nontender  Extremities:  trace peripheral edema.  Neurologic: Nonfocal, moving all four extremities  Skin: No lesions  Access: Rt IJ permcath    Basic Metabolic Panel: Recent Labs  Lab 09/16/20 0658  NA 140  K 4.3  CL 101  CO2 22  GLUCOSE 105*  BUN 64*  CREATININE 14.23*  CALCIUM 8.5*  MG 2.3    Liver Function Tests: Recent Labs  Lab 09/16/20 0658  AST 21  ALT 11  ALKPHOS 54  BILITOT 0.9  PROT 8.3*  ALBUMIN 4.1   Recent Labs  Lab 09/16/20 0658  LIPASE 36   No results for input(s): AMMONIA in the last 168 hours.  CBC: Recent Labs  Lab  09/16/20 0609  WBC 13.9*  NEUTROABS 11.1*  HGB 12.2*  HCT 37.8*  MCV 95.5  PLT 241    Cardiac Enzymes: No results for input(s): CKTOTAL, CKMB, CKMBINDEX, TROPONINI in the last 168 hours.  BNP: Invalid input(s): POCBNP  CBG: No results for input(s): GLUCAP in the last 168 hours.  Microbiology: Results for orders placed or performed during the hospital encounter of 09/16/20  Resp Panel by RT-PCR (Flu A&B, Covid) Nasopharyngeal Swab     Status: None   Collection Time: 09/16/20  6:09 AM   Specimen: Nasopharyngeal Swab; Nasopharyngeal(NP) swabs in vial transport medium  Result Value Ref Range Status   SARS Coronavirus 2 by RT PCR NEGATIVE NEGATIVE Final    Comment: (NOTE) SARS-CoV-2 target nucleic acids are NOT DETECTED.  The SARS-CoV-2 RNA is generally detectable in upper respiratory specimens during the acute phase of infection. The lowest concentration of SARS-CoV-2 viral copies this assay can detect is 138 copies/mL. A negative result does not preclude SARS-Cov-2 infection and should not be used as the sole basis for treatment or other patient management decisions. A negative result may occur with  improper specimen collection/handling, submission of specimen other than nasopharyngeal swab, presence of viral mutation(s) within the areas targeted by this assay, and inadequate number of viral copies(<138 copies/mL). A negative result must be combined with clinical observations, patient history, and epidemiological information. The expected result  is Negative.  Fact Sheet for Patients:  EntrepreneurPulse.com.au  Fact Sheet for Healthcare Providers:  IncredibleEmployment.be  This test is no t yet approved or cleared by the [place] FDA and  has been authorized for detection and/or diagnosis of SARS-CoV-2 by FDA under an Emergency Use Authorization (EUA). This EUA will remain  in effect (meaning this test can be used) for the  duration of the COVID-19 declaration under Section 564(b)(1) of the Act, 21 U.S.C.section 360bbb-3(b)(1), unless the authorization is terminated  or revoked sooner.       Influenza A by PCR NEGATIVE NEGATIVE Final   Influenza B by PCR NEGATIVE NEGATIVE Final    Comment: (NOTE) The Xpert Xpress SARS-CoV-2/FLU/RSV plus assay is intended as an aid in the diagnosis of influenza from Nasopharyngeal swab specimens and should not be used as a sole basis for treatment. Nasal washings and aspirates are unacceptable for Xpert Xpress SARS-CoV-2/FLU/RSV testing.  Fact Sheet for Patients: EntrepreneurPulse.com.au  Fact Sheet for Healthcare Providers: IncredibleEmployment.be  This test is not yet approved or cleared by the [place] FDA and has been authorized for detection and/or diagnosis of SARS-CoV-2 by FDA under an Emergency Use Authorization (EUA). This EUA will remain in effect (meaning this test can be used) for the duration of the COVID-19 declaration under Section 564(b)(1) of the Act, 21 U.S.C. section 360bbb-3(b)(1), unless the authorization is terminated or revoked.  Performed at [facility], [address] [place], [place] [address]     Coagulation Studies: Recent Labs    09/16/20 0609  LABPROT 14.1  INR 1.1    Urinalysis: No results for input(s): COLORURINE, LABSPEC, PHURINE, GLUCOSEU, HGBUR, BILIRUBINUR, KETONESUR, PROTEINUR, UROBILINOGEN, NITRITE, LEUKOCYTESUR in the last 72 hours.  Invalid input(s): APPERANCEUR    Imaging: DG Chest Portable 1 View  Result Date: 09/16/2020 CLINICAL DATA:  Acute severe dyspnea. EXAM: PORTABLE CHEST 1 VIEW COMPARISON:  None. FINDINGS: Right chest wall dialysis catheter with tip overlying the right atrium. Poorly visualized likely enlarged cardiac silhouette. Increased interstitial markings. Airspace opacities within the lower lungs. At least small volume bilateral pleural  effusions, left greater than right. No pneumothorax. No acute osseous abnormality. IMPRESSION: Findings suggestive of pulmonary edema/ARDS with at least small volume bilateral pleural effusions, left greater than right. Superimposed infection/inflammation not excluded. Electronically Signed   By: Iven Finn M.D.   On: 09/16/2020 06:37     Medications:    niCARDipine 10 mg/hr (09/16/20 1315)    amLODipine  10 mg Oral Daily   calcium acetate  1,334 mg Oral TID with meals   carvedilol  6.25 mg Oral BID WC   Chlorhexidine Gluconate Cloth  6 each Topical Q0600   heparin  5,000 Units Subcutaneous Q8H   hydrALAZINE  100 mg Oral TID   losartan  100 mg Oral Daily   acetaminophen **OR** acetaminophen, heparin, ondansetron **OR** ondansetron (ZOFRAN) IV  Assessment/ Plan:  [name] is a 35 y.o.  male with past medical history of hypertension and ESRD on dialysis. He presents to ED this morning after waking with shortness of breath. He presented to his dialysis center, who sent him to ED for this compliant.    Hypertension with chronic kidney disease:Not controlled. Home regimen consists of Amlodipine, carvedilol, losartan and hydralazine.  Currently on amlodipine, carvedilol, hydralazine, and losartan.  Was on Nitroglycerin drip but complaining of headaches. Discontinued this and started nicardipine drip.  Will increase Hydralazine to '100mg'$  TID  2. End stage renal disease on dialysis  Received dialysis today UF 2.9 removed Complained of cramping at end of treatment.  Next treatment will be Monday       LOS: 0   6/24/20223:02 PM

## 2020-09-16 NOTE — ED Notes (Signed)
XR at bedside

## 2020-09-16 NOTE — ED Notes (Signed)
Pt Refused further medical care, Pt Alert and Oriented x 4 , pt advised of possible complications and risk factors regarding leaving against medical advice. Pt became upset and demanding to have his IV removed and be discharged. I advised the  admitted physician was coming to speak with him, he advised he no longer wanted to be treated at this facility, and that his ride was on the way. Pt was able to ambulat in the room and get dressed without any assistance. RN Anderson Malta advised of pt leaving against medical care.

## 2020-09-16 NOTE — Progress Notes (Signed)
Pre HD info 

## 2020-09-17 LAB — TROPONIN I (HIGH SENSITIVITY): Troponin I (High Sensitivity): 185 ng/L (ref <18)

## 2020-11-30 ENCOUNTER — Emergency Department: Payer: Medicare Other

## 2020-11-30 ENCOUNTER — Other Ambulatory Visit: Payer: Self-pay

## 2020-11-30 ENCOUNTER — Observation Stay: Payer: Medicare Other

## 2020-11-30 ENCOUNTER — Inpatient Hospital Stay
Admission: EM | Admit: 2020-11-30 | Discharge: 2020-12-02 | DRG: 291 | Disposition: A | Discharge status: Home / Self Care | Payer: Medicare Other | Attending: Internal Medicine | Admitting: Internal Medicine

## 2020-11-30 DIAGNOSIS — J9601 Acute respiratory failure with hypoxia: Secondary | ICD-10-CM | POA: Diagnosis present

## 2020-11-30 DIAGNOSIS — J918 Pleural effusion in other conditions classified elsewhere: Secondary | ICD-10-CM | POA: Diagnosis present

## 2020-11-30 DIAGNOSIS — I10 Essential (primary) hypertension: Secondary | ICD-10-CM | POA: Diagnosis present

## 2020-11-30 DIAGNOSIS — Z20822 Contact with and (suspected) exposure to covid-19: Secondary | ICD-10-CM | POA: Diagnosis present

## 2020-11-30 DIAGNOSIS — I5033 Acute on chronic diastolic (congestive) heart failure: Secondary | ICD-10-CM | POA: Diagnosis present

## 2020-11-30 DIAGNOSIS — Z833 Family history of diabetes mellitus: Secondary | ICD-10-CM

## 2020-11-30 DIAGNOSIS — Z992 Dependence on renal dialysis: Secondary | ICD-10-CM

## 2020-11-30 DIAGNOSIS — T465X6A Underdosing of other antihypertensive drugs, initial encounter: Secondary | ICD-10-CM | POA: Diagnosis present

## 2020-11-30 DIAGNOSIS — J810 Acute pulmonary edema: Secondary | ICD-10-CM

## 2020-11-30 DIAGNOSIS — N186 End stage renal disease: Secondary | ICD-10-CM | POA: Diagnosis present

## 2020-11-30 DIAGNOSIS — R0602 Shortness of breath: Secondary | ICD-10-CM

## 2020-11-30 DIAGNOSIS — D631 Anemia in chronic kidney disease: Secondary | ICD-10-CM | POA: Diagnosis present

## 2020-11-30 DIAGNOSIS — I160 Hypertensive urgency: Secondary | ICD-10-CM | POA: Diagnosis not present

## 2020-11-30 DIAGNOSIS — I132 Hypertensive heart and chronic kidney disease with heart failure and with stage 5 chronic kidney disease, or end stage renal disease: Secondary | ICD-10-CM | POA: Diagnosis not present

## 2020-11-30 DIAGNOSIS — Z91128 Patient's intentional underdosing of medication regimen for other reason: Secondary | ICD-10-CM

## 2020-11-30 DIAGNOSIS — Z720 Tobacco use: Secondary | ICD-10-CM | POA: Diagnosis present

## 2020-11-30 DIAGNOSIS — I161 Hypertensive emergency: Secondary | ICD-10-CM | POA: Diagnosis present

## 2020-11-30 DIAGNOSIS — Z79899 Other long term (current) drug therapy: Secondary | ICD-10-CM

## 2020-11-30 DIAGNOSIS — Z9889 Other specified postprocedural states: Secondary | ICD-10-CM

## 2020-11-30 DIAGNOSIS — Z8249 Family history of ischemic heart disease and other diseases of the circulatory system: Secondary | ICD-10-CM | POA: Diagnosis not present

## 2020-11-30 DIAGNOSIS — J90 Pleural effusion, not elsewhere classified: Secondary | ICD-10-CM | POA: Diagnosis not present

## 2020-11-30 DIAGNOSIS — F17210 Nicotine dependence, cigarettes, uncomplicated: Secondary | ICD-10-CM | POA: Diagnosis present

## 2020-11-30 LAB — CBC
HCT: 27.5 % — ABNORMAL LOW (ref 39.0–52.0)
Hemoglobin: 9.5 g/dL — ABNORMAL LOW (ref 13.0–17.0)
MCH: 34.3 pg — ABNORMAL HIGH (ref 26.0–34.0)
MCHC: 34.5 g/dL (ref 30.0–36.0)
MCV: 99.3 fL (ref 80.0–100.0)
Platelets: 222 10*3/uL (ref 150–400)
RBC: 2.77 MIL/uL — ABNORMAL LOW (ref 4.22–5.81)
RDW: 15.2 % (ref 11.5–15.5)
WBC: 8.1 10*3/uL (ref 4.0–10.5)
nRBC: 0 % (ref 0.0–0.2)

## 2020-11-30 LAB — BASIC METABOLIC PANEL
Anion gap: 15 (ref 5–15)
BUN: 42 mg/dL — ABNORMAL HIGH (ref 6–20)
CO2: 25 mmol/L (ref 22–32)
Calcium: 9.2 mg/dL (ref 8.9–10.3)
Chloride: 95 mmol/L — ABNORMAL LOW (ref 98–111)
Creatinine, Ser: 7.79 mg/dL — ABNORMAL HIGH (ref 0.61–1.24)
GFR, Estimated: 9 mL/min — ABNORMAL LOW (ref >60)
Glucose, Bld: 102 mg/dL — ABNORMAL HIGH (ref 70–99)
Potassium: 4.1 mmol/L (ref 3.5–5.1)
Sodium: 135 mmol/L (ref 135–145)

## 2020-11-30 LAB — BLOOD GAS, VENOUS
Acid-Base Excess: 7.4 mmol/L — ABNORMAL HIGH (ref 0.0–2.0)
Bicarbonate: 31.2 mmol/L — ABNORMAL HIGH (ref 20.0–28.0)
O2 Saturation: 79.2 %
Patient temperature: 37
pCO2, Ven: 40 mmHg — ABNORMAL LOW (ref 44.0–60.0)
pH, Ven: 7.5 — ABNORMAL HIGH (ref 7.250–7.430)
pO2, Ven: 39 mmHg (ref 32.0–45.0)

## 2020-11-30 LAB — RESP PANEL BY RT-PCR (FLU A&B, COVID) ARPGX2
Influenza A by PCR: NEGATIVE
Influenza B by PCR: NEGATIVE
SARS Coronavirus 2 by RT PCR: NEGATIVE

## 2020-11-30 MED ORDER — HYDRALAZINE HCL 20 MG/ML IJ SOLN
10.0000 mg | INTRAMUSCULAR | Status: DC | PRN
  Administered 2020-11-30 – 2020-12-01 (×4): 10 mg via INTRAVENOUS
  Filled 2020-11-30 (×4): qty 1

## 2020-11-30 MED ORDER — GUAIFENESIN ER 600 MG PO TB12: 600.0000 mg | ORAL_TABLET | Freq: Two times a day (BID) | ORAL | Status: DC | PRN

## 2020-11-30 MED ORDER — ACETAMINOPHEN 325 MG PO TABS
650.0000 mg | ORAL_TABLET | Freq: Four times a day (QID) | ORAL | Status: DC | PRN
Start: 2020-11-30 — End: 2020-12-02
  Administered 2020-11-30 – 2020-12-01 (×3): 650 mg via ORAL
  Filled 2020-11-30 (×4): qty 2

## 2020-11-30 MED ORDER — NITROGLYCERIN 2 % TD OINT
1.0000 [in_us] | TOPICAL_OINTMENT | Freq: Four times a day (QID) | TRANSDERMAL | Status: DC
  Administered 2020-11-30: 1 [in_us] via TOPICAL
  Filled 2020-11-30: qty 1

## 2020-11-30 MED ORDER — ALBUTEROL SULFATE HFA 108 (90 BASE) MCG/ACT IN AERS: 2.0000 | INHALATION_SPRAY | RESPIRATORY_TRACT | Status: DC | PRN

## 2020-11-30 MED ORDER — ENALAPRILAT 1.25 MG/ML IV SOLN
1.2500 mg | Freq: Once | INTRAVENOUS | Status: AC
  Administered 2020-11-30: 1.25 mg via INTRAVENOUS
  Filled 2020-11-30: qty 2

## 2020-11-30 MED ORDER — NITROGLYCERIN IN D5W 200-5 MCG/ML-% IV SOLN
30.0000 ug/min | INTRAVENOUS | Status: DC
  Administered 2020-11-30: 30 ug/min via INTRAVENOUS
  Filled 2020-11-30: qty 250

## 2020-11-30 MED ORDER — AMLODIPINE BESYLATE 10 MG PO TABS
10.0000 mg | ORAL_TABLET | Freq: Every day | ORAL | Status: DC
  Administered 2020-11-30 – 2020-12-02 (×3): 10 mg via ORAL
  Filled 2020-11-30 (×2): qty 1
  Filled 2020-11-30: qty 2

## 2020-11-30 MED ORDER — ALBUTEROL SULFATE (2.5 MG/3ML) 0.083% IN NEBU: 2.5000 mg | INHALATION_SOLUTION | RESPIRATORY_TRACT | Status: DC | PRN

## 2020-11-30 NOTE — ED Provider Notes (Signed)
[facility] Emergency Department Provider Note  ____________________________________________  Time seen: Approximately 12:01 PM  I have reviewed the triage vital signs and the nursing notes.   HISTORY  Chief Complaint Shortness of Breath    HPI [name] is a 35 y.o. male with a history of hypertension, end-stage renal disease on hemodialysis who was sent to the ED after his dialysis session this morning due to shortness of breath.  He had 3800 mL of ultrafiltration during dialysis this morning and still was 80% on room air afterward.  He was placed on 6 L nasal cannula, given 1 nitroglycerin tablet prior to arrival.  Has a history of uncontrolled hypertension and pleural effusions.  Symptoms are constant worsening and severe  Patient denies chest pain. Been on dialysis for over a year.  Does not make urine.   Past Medical History:  Diagnosis Date   ESRD on hemodialysis [facility])    Hypertension      Patient Active Problem List   Diagnosis Date Noted   Hypertensive urgency 11/30/2020   Acute hypoxemic respiratory failure ([place]) 09/16/2020     Past Surgical History:  Procedure Laterality Date   AV FISTULA PLACEMENT Left      Prior to Admission medications   Medication Sig Start Date End Date Taking? Authorizing Provider  amLODipine (NORVASC) 10 MG tablet Take 10 mg by mouth daily. 10/14/19   [provider]  calcium acetate (PHOSLO) 667 MG capsule Take 1,334 mg by mouth 3 (three) times daily. 06/01/20   [provider]  carvedilol (COREG) 6.25 MG tablet Take 6.25 mg by mouth in the morning and at bedtime. 06/22/20   [provider]  lidocaine-prilocaine (EMLA) cream Apply 1 application topically as needed (for dialysis).    [provider]  losartan (COZAAR) 100 MG tablet Take 100 mg by mouth daily. 08/26/20   [provider]     Allergies Patient has no known allergies.   No family history on  file.  Social History Social History   Tobacco Use   Smoking status: Every Day    Types: Cigarettes   Smokeless tobacco: Never    Review of Systems  Constitutional:   No fever or chills.  ENT:   No sore throat. No rhinorrhea. Cardiovascular:   No chest pain or syncope. Respiratory:   Positive shortness of breath without cough. Gastrointestinal:   Negative for abdominal pain, vomiting and diarrhea.  Musculoskeletal:   Negative for focal pain or swelling All other systems reviewed and are negative except as documented above in ROS and HPI.  ____________________________________________   PHYSICAL EXAM:  VITAL SIGNS: ED Triage Vitals  Enc Vitals Group     BP 11/30/20 1000 (!) 219/145     Pulse Rate 11/30/20 0957 (!) 108     Resp 11/30/20 0957 (!) 30     Temp 11/30/20 0957 98.3 F (36.8 C)     Temp Source 11/30/20 0957 Oral     SpO2 11/30/20 0957 100 %     Weight 11/30/20 0955 187 lb 14.4 oz (85.2 kg)     Height --      Head Circumference --      Peak Flow --      Pain Score 11/30/20 0955 0     Pain Loc --      Pain Edu? --      Excl. in [place]? --     Vital signs reviewed, nursing assessments reviewed.   Constitutional:  Alert and oriented.  Mild respiratory distress Eyes:   Conjunctivae are normal. EOMI. PERRL. ENT      Head:   Normocephalic and atraumatic.      Nose:   Normal      Mouth/Throat:   Normal      Neck:   No meningismus. Full ROM. Hematological/Lymphatic/Immunilogical:   No cervical lymphadenopathy. Cardiovascular:   RRR. Symmetric bilateral radial and DP pulses.  No murmurs. Cap refill less than 2 seconds. Respiratory:   Tachypnea.  Right basilar crackles.  Diminished left basilar breath sounds. Gastrointestinal:   Soft and nontender. Non distended. There is no CVA tenderness.  No rebound, rigidity, or guarding. Genitourinary:   deferred Musculoskeletal:   Normal range of motion in all extremities. No joint effusions.  No lower extremity tenderness.   No edema. Neurologic:   Normal speech and language.  Motor grossly intact. No acute focal neurologic deficits are appreciated.  Skin:    Skin is warm, dry and intact. No rash noted.  No petechiae, purpura, or bullae.  ____________________________________________    LABS (pertinent positives/negatives) (all labs ordered are listed, but only abnormal results are displayed) Labs Reviewed  CBC - Abnormal; Notable for the following components:      Result Value   RBC 2.77 (*)    Hemoglobin 9.5 (*)    HCT 27.5 (*)    MCH 34.3 (*)    All other components within normal limits  BASIC METABOLIC PANEL - Abnormal; Notable for the following components:   Chloride 95 (*)    Glucose, Bld 102 (*)    BUN 42 (*)    Creatinine, Ser 7.79 (*)    GFR, Estimated 9 (*)    All other components within normal limits  BLOOD GAS, VENOUS - Abnormal; Notable for the following components:   pH, Ven 7.50 (*)    pCO2, Ven 40 (*)    Bicarbonate 31.2 (*)    Acid-Base Excess 7.4 (*)    All other components within normal limits  RESP PANEL BY RT-PCR (FLU A&B, COVID) [id number]   ____________________________________________   EKG  Interpreted by me Sinus tachycardia rate 108.  Normal axis.  Prolonged QTC of 523 ms.  Normal QRS ST segments and T waves.  No ischemic changes.  ____________________________________________    [id number]  DG Chest Port 1 View  Result Date: 11/30/2020 CLINICAL DATA:  Shortness of breath for 1 week. EXAM: PORTABLE CHEST 1 VIEW COMPARISON:  09/16/2020 FINDINGS: The right IJ dialysis catheter is stable. The cardiac silhouette, mediastinal and hilar contours are within normal limits and stable. Moderate vascular congestion, interstitial pulmonary edema and bilateral pleural effusions, left larger than right. Overlying atelectasis is noted. IMPRESSION: Pulmonary edema and pleural effusions, left larger than right. Electronically Signed   By: Marijo Sanes M.D.   On: 11/30/2020 10:31     ____________________________________________   PROCEDURES .Critical Care  Date/Time: 11/30/2020 12:06 PM Performed by: Carrie Mew, MD Authorized by: Carrie Mew, MD   Critical care provider statement:    Critical care time (minutes):  33   Critical care time was exclusive of:  Separately billable procedures and treating other patients   Critical care was necessary to treat or prevent imminent or life-threatening deterioration of the following conditions:  Respiratory failure, renal failure and circulatory failure   Critical care was time spent personally by me on the following activities:  Development of treatment plan with patient or surrogate, discussions with consultants, evaluation of patient's response to treatment,  examination of patient, obtaining history from patient or surrogate, ordering and performing treatments and interventions, ordering and review of laboratory studies, ordering and review of radiographic studies, pulse oximetry, re-evaluation of patient's condition and review of old charts  ____________________________________________  DIFFERENTIAL DIAGNOSIS   Pulmonary edema, pleural effusion, pneumothorax, pulmonary embolism, pneumonia, hypertensive urgency  CLINICAL IMPRESSION / ASSESSMENT AND PLAN / ED COURSE  Medications ordered in the ED: Medications  hydrALAZINE (APRESOLINE) injection 10 mg (has no administration in time range)  nitroGLYCERIN 50 mg in dextrose 5 % 250 mL (0.2 mg/mL) infusion (has no administration in time range)  enalaprilat (VASOTEC) injection 1.25 mg (1.25 mg Intravenous Given 11/30/20 1025)    Pertinent labs & imaging results that were available during my care of the patient were reviewed by me and considered in my medical decision making (see chart for details).  Wladimir Aerni was evaluated in Emergency Department on 11/30/2020 for the symptoms described in the history of present illness. He was evaluated in the context of the  global COVID-19 pandemic, which necessitated consideration that the patient might be at risk for infection with the SARS-CoV-2 virus that causes COVID-19. Institutional protocols and algorithms that pertain to the evaluation of patients at risk for COVID-19 are in a state of rapid change based on information released by regulatory bodies including the CDC and federal and state organizations. These policies and algorithms were followed during the patient's care in the ED.   Patient presents with acute shortness of breath and hypoxia despite nearly 4 L of fluid removal and dialysis.  Chest x-ray viewed and interpreted by me, shows sizable left pleural effusion as well as some pulmonary edema.  Labs unremarkable.  Blood pressure severely elevated at 220/145.  Discussed with nephrology who will plan to do dialysis tomorrow.  In the meantime, needs thoracentesis and blood pressure management.  Start nitroglycerin drip, IV enalapril, plan to admit.      ____________________________________________   FINAL CLINICAL IMPRESSION(S) / ED DIAGNOSES    Final diagnoses:  Shortness of breath  Acute respiratory failure with hypoxia ([place])  ESRD on hemodialysis ([place])  Uncontrolled hypertension  Pleural effusion     ED Discharge Orders     None       Portions of this note were generated with dragon dictation software. Dictation errors may occur despite best attempts at proofreading.    Carrie Mew, MD 11/30/20 [phone]

## 2020-11-30 NOTE — Plan of Care (Signed)

## 2020-11-30 NOTE — Procedures (Signed)
PROCEDURE SUMMARY:  Successful US guided left thoracentesis. Yielded 1.2 Liters of dark bloody fluid. Pt tolerated procedure well. No immediate complications.  Specimen was not sent for labs. CXR ordered.  EBL < 5 mL  Hedy Jacob PA-C 11/30/2020 12:02 PM

## 2020-11-30 NOTE — H&P (Signed)
History and Physical    Travis Long [id number] DOB: 04-13-1985 DOA: 11/30/2020  Referring MD/NP/PA:   PCP: Pcp, No   Patient coming from:  The patient is coming from home.  At baseline, pt is independent for most of ADL.        Chief Complaint: SOB  HPI: Travis Long is a 35 y.o. male with medical history significant of ESRD-HD (MWF), hypertension, tobacco abuse, anemia, who presents with shortness of breath.  Patient states that he developed shortness of breath since yesterday, which has been progressively worsening today.  He has cough with streaks of bloody mucus production.  He has mild pressure-like chest pain which is located in the front chest, constant, nonradiating.  No fever or chills.  Denies nausea vomiting, diarrhea or abdominal pain.  No symptoms of UTI.  His blood pressure is significantly elevated at 238/120 in ED. patient states that he has not been taking his blood pressure medications recently.  ED Course: pt was found to have WBC 8.1, negative COVID PCR, potassium 4.1, bicarbonate 25, creatinine 7.79, BUN 42, temperature normal, heart rate 103, RR 32, oxygen saturation 100% on 4 L oxygen.  Chest x-ray showed pulmonary edema and bilateral pleural effusion (L>R).  Patient is admitted to progressive bed as inpatient.  Dr. Holley Raring of renal is consulted.  Review of Systems:   General: no fevers, chills, no body weight gain, has fatigue HEENT: no blurry vision, hearing changes or sore throat Respiratory: has dyspnea, coughing, no wheezing CV: has chest pain, no palpitations GI: no nausea, vomiting, abdominal pain, diarrhea, constipation GU: no dysuria, burning on urination, increased urinary frequency, hematuria  Ext: has mild leg edema Neuro: no unilateral weakness, numbness, or tingling, no vision change or hearing loss Skin: no rash, no skin tear. MSK: No muscle spasm, no deformity, no limitation of range of movement in spin Heme: No easy bruising.  Travel  history: No recent long distant travel.  Allergy: No Known Allergies  Past Medical History:  Diagnosis Date   ESRD on hemodialysis ([place])    Hypertension     Past Surgical History:  Procedure Laterality Date   AV FISTULA PLACEMENT Left     Social History:  reports that he has been smoking cigarettes. He has never used smokeless tobacco. He reports that he does not currently use alcohol. He reports that he does not use drugs.  Family History:  Family History  Problem Relation Age of Onset   Hypertension Mother    Diabetes Mellitus II Mother      Prior to Admission medications   Medication Sig Start Date End Date Taking? Authorizing Provider  amLODipine (NORVASC) 10 MG tablet Take 10 mg by mouth daily. 10/14/19   [provider]  calcium acetate (PHOSLO) 667 MG capsule Take 1,334 mg by mouth 3 (three) times daily. 06/01/20   [provider]  carvedilol (COREG) 6.25 MG tablet Take 6.25 mg by mouth in the morning and at bedtime. 06/22/20   [provider]  lidocaine-prilocaine (EMLA) cream Apply 1 application topically as needed (for dialysis).    [provider]  losartan (COZAAR) 100 MG tablet Take 100 mg by mouth daily. 08/26/20   [provider]    Physical Exam: Vitals:   11/30/20 1615 11/30/20 1630 11/30/20 1700 11/30/20 1813  BP:  (!) 184/116 (!) 174/111 (!) 159/93  Pulse: 92  (!) 106 (!) 101  Resp:      Temp:    97.9 F (36.6 C)  TempSrc:      SpO2:  93% 97% 96%  Weight:       General: Not in acute distress HEENT:       Eyes: PERRL, EOMI, no scleral icterus.       ENT: No discharge from the ears and nose, no pharynx injection, no tonsillar enlargement.        Neck: positive JVD, no bruit, no mass felt. Heme: No neck lymph node enlargement. Cardiac: S1/S2, RRR, No murmurs, No gallops or rubs. Respiratory: Has fine crackles bilaterally GI: Soft, nondistended, nontender, no rebound pain, no organomegaly, BS present. GU: No  hematuria Ext: Has trace leg edema bilaterally. 1+DP/PT pulse bilaterally. Musculoskeletal: No joint deformities, No joint redness or warmth, no limitation of ROM in spin. Skin: No rashes.  Neuro: Alert, oriented X3, cranial nerves II-XII grossly intact, moves all extremities normally.  Psych: Patient is not psychotic, no suicidal or hemocidal ideation.  Labs on Admission: I have personally reviewed following labs and imaging studies  CBC: Recent Labs  Lab 11/30/20 1001  WBC 8.1  HGB 9.5*  HCT 27.5*  MCV 99.3  PLT [id number]   Basic Metabolic Panel: Recent Labs  Lab 11/30/20 1001  NA 135  K 4.1  CL 95*  CO2 25  GLUCOSE 102*  BUN 42*  CREATININE 7.79*  CALCIUM 9.2   GFR: Estimated Creatinine Clearance: 14.3 mL/min (A) (by C-G formula based on SCr of 7.79 mg/dL (H)). Liver Function Tests: No results for input(s): AST, ALT, ALKPHOS, BILITOT, PROT, ALBUMIN in the last 168 hours. No results for input(s): LIPASE, AMYLASE in the last 168 hours. No results for input(s): AMMONIA in the last 168 hours. Coagulation Profile: No results for input(s): INR, PROTIME in the last 168 hours. Cardiac Enzymes: No results for input(s): CKTOTAL, CKMB, CKMBINDEX, TROPONINI in the last 168 hours. BNP (last 3 results) No results for input(s): PROBNP in the last 8760 hours. HbA1C: No results for input(s): HGBA1C in the last 72 hours. CBG: No results for input(s): GLUCAP in the last 168 hours. Lipid Profile: No results for input(s): CHOL, HDL, LDLCALC, TRIG, CHOLHDL, LDLDIRECT in the last 72 hours. Thyroid Function Tests: No results for input(s): TSH, T4TOTAL, FREET4, T3FREE, THYROIDAB in the last 72 hours. Anemia Panel: No results for input(s): VITAMINB12, FOLATE, FERRITIN, TIBC, IRON, RETICCTPCT in the last 72 hours. Urine analysis: No results found for: COLORURINE, APPEARANCEUR, LABSPEC, PHURINE, GLUCOSEU, HGBUR, BILIRUBINUR, KETONESUR, PROTEINUR, UROBILINOGEN, NITRITE, LEUKOCYTESUR Sepsis  Labs: '@LABRCNTIP'$ (procalcitonin:4,lacticidven:4) ) Recent Results (from the past 240 hour(s))  Resp Panel by RT-PCR (Flu A&B, Covid) Nasopharyngeal Swab     Status: None   Collection Time: 11/30/20  9:56 AM   Specimen: Nasopharyngeal Swab; Nasopharyngeal(NP) swabs in vial transport medium  Result Value Ref Range Status   SARS Coronavirus 2 by RT PCR NEGATIVE NEGATIVE Final    Comment: (NOTE) SARS-CoV-2 target nucleic acids are NOT DETECTED.  The SARS-CoV-2 RNA is generally detectable in upper respiratory specimens during the acute phase of infection. The lowest concentration of SARS-CoV-2 viral copies this assay can detect is 138 copies/mL. A negative result does not preclude SARS-Cov-2 infection and should not be used as the sole basis for treatment or other patient management decisions. A negative result may occur with  improper specimen collection/handling, submission of specimen other than nasopharyngeal swab, presence of viral mutation(s) within the areas targeted by this assay, and inadequate number of viral copies(<138 copies/mL). A negative result must be combined with clinical observations, patient history, and epidemiological  information. The expected result is Negative.  Fact Sheet for Patients:  EntrepreneurPulse.com.au  Fact Sheet for Healthcare Providers:  IncredibleEmployment.be  This test is no t yet approved or cleared by the [place] FDA and  has been authorized for detection and/or diagnosis of SARS-CoV-2 by FDA under an Emergency Use Authorization (EUA). This EUA will remain  in effect (meaning this test can be used) for the duration of the COVID-19 declaration under Section 564(b)(1) of the Act, 21 U.S.C.section 360bbb-3(b)(1), unless the authorization is terminated  or revoked sooner.       Influenza A by PCR NEGATIVE NEGATIVE Final   Influenza B by PCR NEGATIVE NEGATIVE Final    Comment: (NOTE) The Xpert Xpress  SARS-CoV-2/FLU/RSV plus assay is intended as an aid in the diagnosis of influenza from Nasopharyngeal swab specimens and should not be used as a sole basis for treatment. Nasal washings and aspirates are unacceptable for Xpert Xpress SARS-CoV-2/FLU/RSV testing.  Fact Sheet for Patients: EntrepreneurPulse.com.au  Fact Sheet for Healthcare Providers: IncredibleEmployment.be  This test is not yet approved or cleared by the [place] FDA and has been authorized for detection and/or diagnosis of SARS-CoV-2 by FDA under an Emergency Use Authorization (EUA). This EUA will remain in effect (meaning this test can be used) for the duration of the COVID-19 declaration under Section 564(b)(1) of the Act, 21 U.S.C. section 360bbb-3(b)(1), unless the authorization is terminated or revoked.  Performed at [facility], [address] [place], [place] [address]      Radiological Exams on Admission: DG Chest [facility] 1 View  Result Date: 11/30/2020 CLINICAL DATA:  Status post left thoracentesis. EXAM: PORTABLE CHEST 1 VIEW COMPARISON:  Chest x-ray earlier today at 1000 hours FINDINGS: Stable heart size and appearance of dialysis catheter. Reduction in left pleural fluid volume with residual fluid remaining which is likely partially loculated. Smaller right pleural effusion. Bilateral lower lobe atelectasis and probable component of pulmonary edema. No pneumothorax. IMPRESSION: Reduction in left pleural fluid volume with residual fluid remaining that is likely partially loculated. No pneumothorax. Small right pleural effusion. Probable component of pulmonary edema. Electronically Signed   By: Aletta Edouard M.D.   On: 11/30/2020 12:36   DG Chest Port 1 View  Result Date: 11/30/2020 CLINICAL DATA:  Shortness of breath for 1 week. EXAM: PORTABLE CHEST 1 VIEW COMPARISON:  09/16/2020 FINDINGS: The right IJ dialysis catheter is stable. The cardiac silhouette,  mediastinal and hilar contours are within normal limits and stable. Moderate vascular congestion, interstitial pulmonary edema and bilateral pleural effusions, left larger than right. Overlying atelectasis is noted. IMPRESSION: Pulmonary edema and pleural effusions, left larger than right. Electronically Signed   By: Marijo Sanes M.D.   On: 11/30/2020 10:31   US THORACENTESIS ASP PLEURAL SPACE W/IMG GUIDE  Result Date: 11/30/2020 INDICATION: Bilateral pleural effusions request received for therapeutic thoracentesis. EXAM: ULTRASOUND GUIDED LEFT THORACENTESIS MEDICATIONS: Local 1% lidocaine only. COMPLICATIONS: None immediate. PROCEDURE: An ultrasound guided thoracentesis was thoroughly discussed with the patient and questions answered. The benefits, risks, alternatives and complications were also discussed. The patient understands and wishes to proceed with the procedure. Written consent was obtained. Ultrasound was performed to localize and mark an adequate pocket of fluid in the left chest. The area was then prepped and draped in the normal sterile fashion. 1% Lidocaine was used for local anesthesia. Under ultrasound guidance a 19 gauge, 7-cm, Yueh catheter was introduced. Thoracentesis was performed. The catheter was removed and a dressing applied. FINDINGS: A  total of approximately 1.2 L of dark bloody fluid was removed. IMPRESSION: Successful ultrasound guided left thoracentesis yielding 1.2 L of pleural fluid. Read By: Tsosie Billing PA-C Electronically Signed   By: Aletta Edouard M.D.   On: 11/30/2020 12:09     EKG: I have personally reviewed.  Sinus rhythm, QTC 523, LAE, poor R wave progression, poor quality of EKG strips  Assessment/Plan Principal Problem:   Acute pulmonary edema (HCC) Active Problems:   Acute respiratory failure with hypoxia (HCC)   Hypertensive emergency   ESRD on hemodialysis ([place])   Hypertension   Anemia in ESRD (end-stage renal disease) ([place])   Tobacco abuse    Bilateral pleural effusion   Acute respiratory failure with hypoxia due to acute pulmonary edema: Chest x-ray showed pulmonary edema and bilateral pleural effusion.  His bilateral pleural effusion may have also contributed to shortness of breath.  -Admitted to progressive bed as inpatient -Nitroglycerin drip -As needed albuterol -Blood pressure control as below -Thoracentesis per IR  Hypertensive emergency and HTN: Initial blood pressure 238/120, most likely due to medication noncompliance.  Patient was given enalapril 1.25 mg in ED, blood pressure slightly improved to 209/138. -Started nitroglycerin drip -Resume home amlodipine 10 mg daily, coreg 6.25 mg twice daily and Cozaar 100 mg daily -As needed IV hydralazine  ESRD on hemodialysis (MWF) -Dr. Holley Raring of renal is consulted for dialysis  Anemia in ESRD (end-stage renal disease) ([place]): Hemoglobin 12.2 on 09/16/2020, he is hemoglobin is 9.5 today, no active bleeding -Follow-up with CBC  Tobacco abuse -Nicotine patch  Bilateral pleural effusion (L>R): IR did left thoracentesis today per ED physician's order, but did not send out fluid analysis test.  They will do right thoracentesis tomorrow -Ordered thoracentesis is again -f/u fluid analysis    DVT ppx: SCD Code Status: Full code Family Communication: not done, no family member is at bed side.   Disposition Plan:  Anticipate discharge back to previous environment Consults called: Dr. Holley Raring of renal Admission status and Level of care: Progressive Cardiac:   as inpt        Status is: Inpatient  Remains inpatient appropriate because:Inpatient level of care appropriate due to severity of illness  Dispo: The patient is from: Home              Anticipated d/c is to: Home              Patient currently is not medically stable to d/c.   Difficult to place patient No           Date of Service 11/30/2020    Ivor Costa [facility]   If 7PM-7AM, please contact  night-coverage [email or web address] 11/30/2020, 6:25 PM

## 2020-11-30 NOTE — ED Notes (Signed)
Pt being transported to 237 at this time by [employer]

## 2020-11-30 NOTE — ED Triage Notes (Signed)
Pt to ED [facility] from [facility] for [facility] x1 week. 80% on RA, 100% on 6L [place]. 3800 removed during dialysis today. Hypertensive 238/120, 1 nitro given PTA.  Denies pain.  No O2 use at home.

## 2020-12-01 ENCOUNTER — Inpatient Hospital Stay: Payer: Medicare Other

## 2020-12-01 LAB — HEPATITIS B SURFACE ANTIGEN: Hepatitis B Surface Ag: NONREACTIVE

## 2020-12-01 LAB — BODY FLUID CELL COUNT WITH DIFFERENTIAL
Eos, Fluid: 2 %
Lymphs, Fluid: 9 %
Monocyte-Macrophage-Serous Fluid: 66 %
Neutrophil Count, Fluid: 23 %
Total Nucleated Cell Count, Fluid: 5252 cu mm

## 2020-12-01 LAB — APTT: aPTT: 35 seconds (ref 24–36)

## 2020-12-01 LAB — BASIC METABOLIC PANEL
Anion gap: 17 — ABNORMAL HIGH (ref 5–15)
BUN: 67 mg/dL — ABNORMAL HIGH (ref 6–20)
CO2: 24 mmol/L (ref 22–32)
Calcium: 8.5 mg/dL — ABNORMAL LOW (ref 8.9–10.3)
Chloride: 91 mmol/L — ABNORMAL LOW (ref 98–111)
Creatinine, Ser: 11.34 mg/dL — ABNORMAL HIGH (ref 0.61–1.24)
GFR, Estimated: 6 mL/min — ABNORMAL LOW (ref >60)
Glucose, Bld: 108 mg/dL — ABNORMAL HIGH (ref 70–99)
Potassium: 4.3 mmol/L (ref 3.5–5.1)
Sodium: 132 mmol/L — ABNORMAL LOW (ref 135–145)

## 2020-12-01 LAB — LACTATE DEHYDROGENASE, PLEURAL OR PERITONEAL FLUID: LD, Fluid: 255 U/L — ABNORMAL HIGH (ref 3–23)

## 2020-12-01 LAB — PROTIME-INR
INR: 1.2 (ref 0.8–1.2)
Prothrombin Time: 15.6 seconds — ABNORMAL HIGH (ref 11.4–15.2)

## 2020-12-01 LAB — PROTEIN, PLEURAL OR PERITONEAL FLUID: Total protein, fluid: 4.4 g/dL

## 2020-12-01 LAB — HIV ANTIBODY (ROUTINE TESTING W REFLEX): HIV Screen 4th Generation wRfx: NONREACTIVE

## 2020-12-01 MED ORDER — SODIUM CHLORIDE 0.9 % IV SOLN: 100.0000 mL | INTRAVENOUS | Status: DC | PRN

## 2020-12-01 MED ORDER — CALCIUM ACETATE (PHOS BINDER) 667 MG PO CAPS
1334.0000 mg | ORAL_CAPSULE | Freq: Three times a day (TID) | ORAL | Status: DC
  Administered 2020-12-01 – 2020-12-02 (×4): 1334 mg via ORAL
  Filled 2020-12-01 (×6): qty 2

## 2020-12-01 MED ORDER — CARVEDILOL 6.25 MG PO TABS
6.2500 mg | ORAL_TABLET | Freq: Two times a day (BID) | ORAL | Status: DC
  Administered 2020-12-01 – 2020-12-02 (×3): 6.25 mg via ORAL
  Filled 2020-12-01 (×3): qty 1

## 2020-12-01 MED ORDER — CLONIDINE HCL 0.2 MG/24HR TD PTWK
0.2000 mg | MEDICATED_PATCH | TRANSDERMAL | Status: DC
  Administered 2020-12-01: 0.2 mg via TRANSDERMAL
  Filled 2020-12-01 (×2): qty 1

## 2020-12-01 MED ORDER — ONDANSETRON HCL 4 MG/2ML IJ SOLN: 4.0000 mg | Freq: Four times a day (QID) | INTRAMUSCULAR | Status: DC | PRN

## 2020-12-01 MED ORDER — OXYCODONE-ACETAMINOPHEN 5-325 MG PO TABS: 1.0000 | ORAL_TABLET | ORAL | Status: DC | PRN

## 2020-12-01 MED ORDER — LIDOCAINE-PRILOCAINE 2.5-2.5 % EX CREA
1.0000 "application " | TOPICAL_CREAM | CUTANEOUS | Status: DC | PRN
  Filled 2020-12-01: qty 5

## 2020-12-01 MED ORDER — ALTEPLASE 2 MG IJ SOLR: 2.0000 mg | Freq: Once | INTRAMUSCULAR | Status: DC | PRN

## 2020-12-01 MED ORDER — CHLORHEXIDINE GLUCONATE CLOTH 2 % EX PADS
6.0000 | MEDICATED_PAD | Freq: Every day | CUTANEOUS | Status: DC
  Administered 2020-12-01: 6 via TOPICAL

## 2020-12-01 MED ORDER — PENTAFLUOROPROP-TETRAFLUOROETH EX AERO
1.0000 "application " | INHALATION_SPRAY | CUTANEOUS | Status: DC | PRN
  Filled 2020-12-01: qty 30

## 2020-12-01 MED ORDER — HEPARIN SODIUM (PORCINE) 1000 UNIT/ML DIALYSIS
1000.0000 [IU] | INTRAMUSCULAR | Status: DC | PRN
  Filled 2020-12-01: qty 1

## 2020-12-01 MED ORDER — LOSARTAN POTASSIUM 50 MG PO TABS
100.0000 mg | ORAL_TABLET | Freq: Every day | ORAL | Status: DC
  Administered 2020-12-01 – 2020-12-02 (×2): 100 mg via ORAL
  Filled 2020-12-01 (×2): qty 2

## 2020-12-01 MED ORDER — MINOXIDIL 2.5 MG PO TABS
5.0000 mg | ORAL_TABLET | Freq: Every day | ORAL | Status: DC
  Administered 2020-12-01 – 2020-12-02 (×2): 5 mg via ORAL
  Filled 2020-12-01 (×2): qty 2

## 2020-12-01 MED ORDER — LIDOCAINE HCL (PF) 1 % IJ SOLN
5.0000 mL | INTRAMUSCULAR | Status: DC | PRN
  Filled 2020-12-01: qty 5

## 2020-12-01 NOTE — Progress Notes (Signed)
PROGRESS NOTE    Kwabena Achee  [id number] DOB: 08/06/1985 DOA: 11/30/2020 PCP: Pcp, No   Chief complaint shortness of breath. Brief Narrative:  Jovin Melka is a 35 y.o. male with medical history significant of ESRD-HD (MWF), hypertension, tobacco abuse, anemia, who presents with shortness of breath. Patient has not been compliant with his blood pressure medicines, he has not been taking them for a year.  He came back to the hospital with a severe volume overload and hypertension emergency.  He was emergently dialyzed yesterday and also removed 1.4 L of pleural fluid from the left.   Assessment & Plan:   Principal Problem:   Acute pulmonary edema (HCC) Active Problems:   Acute respiratory failure with hypoxia (HCC)   Hypertensive emergency   ESRD on hemodialysis ([place])   Hypertension   Anemia in ESRD (end-stage renal disease) (HCC)   Tobacco abuse   Bilateral pleural effusion  #1.  Acute respiratory failure with hypoxemia secondary to acute pulmonary edema. Acute on chronic diastolic congestive heart failure. Hypertension emergency. End-stage renal disease. Patient condition appears to be secondary to hypertensive emergency.  He has not been compliant with his blood pressure medicines.  He states that he has not been taking it for a year due to the fact that he does not believe the medicines are working.  He was initially placed on nitroglycerin drip.  He developed severe headaches. I will continue prior medicines of blood for blood pressure, also added clonidine and minoxidil. G, patient be dialyzed today and tomorrow.  Most likely will be discharged home tomorrow.   #2.  Bilateral pleural effusions. Patient had thoracentesis on both side.      DVT prophylaxis: SCDs Code Status: full Family Communication:  Disposition Plan:    Status is: Inpatient  Remains inpatient appropriate because:Inpatient level of care appropriate due to severity of illness  Dispo: The  patient is from: Home              Anticipated d/c is to: Home              Patient currently is not medically stable to d/c.   Difficult to place patient No        No intake/output data recorded. Total I/O In: 120 [P.O.:120] Out: -      Consultants:  nephrology  Procedures: HD  Antimicrobials: None  Subjective: Patient is complaining of severe headaches today, no nausea vomiting. Denies any abdominal pain or diarrhea. No short of breath today, off oxygen. No dysuria hematuria. No fever or chills.  Objective: Vitals:   12/01/20 0400 12/01/20 0600 12/01/20 0808 12/01/20 1105  BP: (!) 154/103 (!) 165/98 (!) 166/109   Pulse:   99   Resp: '15 19 17   '$ Temp: 98.1 F (36.7 C) 98 F (36.7 C) 98.1 F (36.7 C)   TempSrc: Oral  Oral   SpO2: 96%  100% 97%  Weight:      Height:        Intake/Output Summary (Last 24 hours) at 12/01/2020 1208 Last data filed at 12/01/2020 1016 Gross per 24 hour  Intake 120 ml  Output --  Net 120 ml   Filed Weights   11/30/20 0955 11/30/20 1845  Weight: 85.2 kg 85.2 kg    Examination:  General exam: Appears calm and comfortable  Respiratory system: Clear to auscultation. Respiratory effort normal. Cardiovascular system: S1 & S2 heard, RRR. No JVD, murmurs, rubs, gallops or clicks. No pedal edema. Gastrointestinal system: Abdomen is  nondistended, soft and nontender. No organomegaly or masses felt. Normal bowel sounds heard. Central nervous system: Alert and oriented. No focal neurological deficits. Extremities: Symmetric 5 x 5 power. Skin: No rashes, lesions or ulcers Psychiatry: Judgement and insight appear normal. Mood & affect appropriate.     Data Reviewed: I have personally reviewed following labs and imaging studies  CBC: Recent Labs  Lab 11/30/20 1001  WBC 8.1  HGB 9.5*  HCT 27.5*  MCV 99.3  PLT [id number]   Basic Metabolic Panel: Recent Labs  Lab 11/30/20 1001 12/01/20 0353  NA 135 132*  K 4.1 4.3  CL 95* 91*   CO2 25 24  GLUCOSE 102* 108*  BUN 42* 67*  CREATININE 7.79* 11.34*  CALCIUM 9.2 8.5*   GFR: Estimated Creatinine Clearance: 10 mL/min (A) (by C-G formula based on SCr of 11.34 mg/dL (H)). Liver Function Tests: No results for input(s): AST, ALT, ALKPHOS, BILITOT, PROT, ALBUMIN in the last 168 hours. No results for input(s): LIPASE, AMYLASE in the last 168 hours. No results for input(s): AMMONIA in the last 168 hours. Coagulation Profile: Recent Labs  Lab 12/01/20 0353  INR 1.2   Cardiac Enzymes: No results for input(s): CKTOTAL, CKMB, CKMBINDEX, TROPONINI in the last 168 hours. BNP (last 3 results) No results for input(s): PROBNP in the last 8760 hours. HbA1C: No results for input(s): HGBA1C in the last 72 hours. CBG: No results for input(s): GLUCAP in the last 168 hours. Lipid Profile: No results for input(s): CHOL, HDL, LDLCALC, TRIG, CHOLHDL, LDLDIRECT in the last 72 hours. Thyroid Function Tests: No results for input(s): TSH, T4TOTAL, FREET4, T3FREE, THYROIDAB in the last 72 hours. Anemia Panel: No results for input(s): VITAMINB12, FOLATE, FERRITIN, TIBC, IRON, RETICCTPCT in the last 72 hours. Sepsis Labs: No results for input(s): PROCALCITON, LATICACIDVEN in the last 168 hours.  Recent Results (from the past 240 hour(s))  Resp Panel by RT-PCR (Flu A&B, Covid) Nasopharyngeal Swab     Status: None   Collection Time: 11/30/20  9:56 AM   Specimen: Nasopharyngeal Swab; Nasopharyngeal(NP) swabs in vial transport medium  Result Value Ref Range Status   SARS Coronavirus 2 by RT PCR NEGATIVE NEGATIVE Final    Comment: (NOTE) SARS-CoV-2 target nucleic acids are NOT DETECTED.  The SARS-CoV-2 RNA is generally detectable in upper respiratory specimens during the acute phase of infection. The lowest concentration of SARS-CoV-2 viral copies this assay can detect is 138 copies/mL. A negative result does not preclude SARS-Cov-2 infection and should not be used as the sole basis  for treatment or other patient management decisions. A negative result may occur with  improper specimen collection/handling, submission of specimen other than nasopharyngeal swab, presence of viral mutation(s) within the areas targeted by this assay, and inadequate number of viral copies(<138 copies/mL). A negative result must be combined with clinical observations, patient history, and epidemiological information. The expected result is Negative.  Fact Sheet for Patients:  EntrepreneurPulse.com.au  Fact Sheet for Healthcare Providers:  IncredibleEmployment.be  This test is no t yet approved or cleared by the [place] FDA and  has been authorized for detection and/or diagnosis of SARS-CoV-2 by FDA under an Emergency Use Authorization (EUA). This EUA will remain  in effect (meaning this test can be used) for the duration of the COVID-19 declaration under Section 564(b)(1) of the Act, 21 U.S.C.section 360bbb-3(b)(1), unless the authorization is terminated  or revoked sooner.       Influenza A by PCR NEGATIVE NEGATIVE Final   Influenza  B by PCR NEGATIVE NEGATIVE Final    Comment: (NOTE) The Xpert Xpress SARS-CoV-2/FLU/RSV plus assay is intended as an aid in the diagnosis of influenza from Nasopharyngeal swab specimens and should not be used as a sole basis for treatment. Nasal washings and aspirates are unacceptable for Xpert Xpress SARS-CoV-2/FLU/RSV testing.  Fact Sheet for Patients: EntrepreneurPulse.com.au  Fact Sheet for Healthcare Providers: IncredibleEmployment.be  This test is not yet approved or cleared by the [place] FDA and has been authorized for detection and/or diagnosis of SARS-CoV-2 by FDA under an Emergency Use Authorization (EUA). This EUA will remain in effect (meaning this test can be used) for the duration of the COVID-19 declaration under Section 564(b)(1) of the Act, 21  U.S.C. section 360bbb-3(b)(1), unless the authorization is terminated or revoked.  Performed at [facility], [address], [place], [place] [address]          Radiology Studies: [facility] Chest [place] 1 View  Result Date: 12/01/2020 CLINICAL DATA:  Status post right thoracentesis. EXAM: PORTABLE CHEST 1 VIEW COMPARISON:  11/30/2020 FINDINGS: Small right pleural effusion. Moderate left pleural effusion persists. Patchy basilar collapse/consolidation noted bilaterally. No evidence for pneumothorax. Right IJ central line remains in place. Telemetry leads overlie the chest. IMPRESSION: No evidence for right-sided pneumothorax status post thoracentesis. Electronically Signed   By: Misty Stanley M.D.   On: 12/01/2020 12:04   DG Chest Port 1 View  Result Date: 11/30/2020 CLINICAL DATA:  Status post left thoracentesis. EXAM: PORTABLE CHEST 1 VIEW COMPARISON:  Chest x-ray earlier today at 1000 hours FINDINGS: Stable heart size and appearance of dialysis catheter. Reduction in left pleural fluid volume with residual fluid remaining which is likely partially loculated. Smaller right pleural effusion. Bilateral lower lobe atelectasis and probable component of pulmonary edema. No pneumothorax. IMPRESSION: Reduction in left pleural fluid volume with residual fluid remaining that is likely partially loculated. No pneumothorax. Small right pleural effusion. Probable component of pulmonary edema. Electronically Signed   By: Aletta Edouard M.D.   On: 11/30/2020 12:36   DG Chest Port 1 View  Result Date: 11/30/2020 CLINICAL DATA:  Shortness of breath for 1 week. EXAM: PORTABLE CHEST 1 VIEW COMPARISON:  09/16/2020 FINDINGS: The right IJ dialysis catheter is stable. The cardiac silhouette, mediastinal and hilar contours are within normal limits and stable. Moderate vascular congestion, interstitial pulmonary edema and bilateral pleural effusions, left larger than right. Overlying atelectasis is noted.  IMPRESSION: Pulmonary edema and pleural effusions, left larger than right. Electronically Signed   By: Marijo Sanes M.D.   On: 11/30/2020 10:31   US THORACENTESIS ASP PLEURAL SPACE W/IMG GUIDE  Result Date: 11/30/2020 INDICATION: Bilateral pleural effusions request received for therapeutic thoracentesis. EXAM: ULTRASOUND GUIDED LEFT THORACENTESIS MEDICATIONS: Local 1% lidocaine only. COMPLICATIONS: None immediate. PROCEDURE: An ultrasound guided thoracentesis was thoroughly discussed with the patient and questions answered. The benefits, risks, alternatives and complications were also discussed. The patient understands and wishes to proceed with the procedure. Written consent was obtained. Ultrasound was performed to localize and mark an adequate pocket of fluid in the left chest. The area was then prepped and draped in the normal sterile fashion. 1% Lidocaine was used for local anesthesia. Under ultrasound guidance a 19 gauge, 7-cm, Yueh catheter was introduced. Thoracentesis was performed. The catheter was removed and a dressing applied. FINDINGS: A total of approximately 1.2 L of dark bloody fluid was removed. IMPRESSION: Successful ultrasound guided left thoracentesis yielding 1.2 L of pleural fluid. Read By: Tsosie Billing  PA-C Electronically Signed   By: Aletta Edouard M.D.   On: 11/30/2020 12:09        Scheduled Meds:  amLODipine  10 mg Oral Daily   calcium acetate  1,334 mg Oral TID with meals   carvedilol  6.25 mg Oral BID WC   Chlorhexidine Gluconate Cloth  6 each Topical Q0600   cloNIDine  0.2 mg Transdermal Weekly   losartan  100 mg Oral Daily   minoxidil  5 mg Oral Daily   Continuous Infusions:  sodium chloride     sodium chloride       LOS: 1 day    Time spent: 27 minutes    Sharen Hones, MD [facility]   To contact the attending provider between 7A-7P or the covering provider during after hours 7P-7A, please log into the web site [email or web address] and access using  universal [place] password for that web site. If you do not have the password, please call the hospital operator.  12/01/2020, 12:08 PM

## 2020-12-01 NOTE — Progress Notes (Addendum)
[facility]  ROUNDING NOTE   Subjective:   Travis Long is a 35 y.o. male with past medical history of hypertension, anemia and end stage renal disease on dialysis.  He presents to the emergency room after receiving outpatient dialysis treatment with complaints of shortness of breath.  Patient is admitted for Shortness of breath [R06.02] Acute pulmonary edema (HCC) [J81.0] Pleural effusion [J90] S/P thoracentesis [Z98.890] Bilateral pleural effusion [J90] Pleural effusion on right [J90] Hypertensive urgency [I16.0] Acute respiratory failure with hypoxia ([place]) [J96.01] Uncontrolled hypertension [I10] ESRD on hemodialysis ([place]) [N18.6, Z99.2]  Patient known to our practice and receives dialysis at [facility] supervised by Dr Holley Raring.  He states he became short of breath yesterday, went to his treatment but progressively got worse after treatment.  Dialysis staff encouraged him to come to the emergency room to be checked out.  Reports having cough with streaks of blood.  Reported some chest pain that was described as constant, nonradiating yesterday.  Denies chest pain today. Denies nausea, vomiting and diarrhea. On ED arrival, BP 238/120, reports he has not taken his BP medications. Chest X ray shows pulmonary edema and bilateral effusion.   We have been consulted to manage dialysis and assist with severe hypertension during this admission.   Objective:  Vital signs in last 24 hours:  Temp:  [97.9 F (36.6 C)-98.5 F (36.9 C)] 98.5 F (36.9 C) (09/08 1400) Pulse Rate:  [58-106] 88 (09/08 1415) Resp:  [15-35] 25 (09/08 1415) BP: (123-184)/(65-116) 134/92 (09/08 1415) SpO2:  [90 %-100 %] 97 % (09/08 1232) Weight:  [85.2 kg] 85.2 kg (09/07 1845)  Weight change:  Filed Weights   11/30/20 0955 11/30/20 1845  Weight: 85.2 kg 85.2 kg    Intake/Output: No intake/output data recorded.   Intake/Output this shift:  Total I/O In: 480 [P.O.:480] Out: -   Physical  Exam: General: NAD, laying in bed  Head: Normocephalic, atraumatic. Moist oral mucosal membranes  Eyes: Anicteric  Lungs:  Clear to auscultation, normal effort  Heart: Regular rate and rhythm  Abdomen:  Soft, nontender  Extremities:  no peripheral edema.  Neurologic: Nonfocal, moving all four extremities  Skin: No lesions  Access: Rt IJ Permcath    Basic Metabolic Panel: Recent Labs  Lab 11/30/20 1001 12/01/20 0353  NA 135 132*  K 4.1 4.3  CL 95* 91*  CO2 25 24  GLUCOSE 102* 108*  BUN 42* 67*  CREATININE 7.79* 11.34*  CALCIUM 9.2 8.5*    Liver Function Tests: No results for input(s): AST, ALT, ALKPHOS, BILITOT, PROT, ALBUMIN in the last 168 hours. No results for input(s): LIPASE, AMYLASE in the last 168 hours. No results for input(s): AMMONIA in the last 168 hours.  CBC: Recent Labs  Lab 11/30/20 1001  WBC 8.1  HGB 9.5*  HCT 27.5*  MCV 99.3  PLT 222    Cardiac Enzymes: No results for input(s): CKTOTAL, CKMB, CKMBINDEX, TROPONINI in the last 168 hours.  BNP: Invalid input(s): POCBNP  CBG: No results for input(s): GLUCAP in the last 168 hours.  Microbiology: Results for orders placed or performed during the hospital encounter of 11/30/20  Resp Panel by RT-PCR (Flu A&B, Covid) Nasopharyngeal Swab     Status: None   Collection Time: 11/30/20  9:56 AM   Specimen: Nasopharyngeal Swab; Nasopharyngeal(NP) swabs in vial transport medium  Result Value Ref Range Status   SARS Coronavirus 2 by RT PCR NEGATIVE NEGATIVE Final    Comment: (NOTE) SARS-CoV-2 target nucleic acids  are NOT DETECTED.  The SARS-CoV-2 RNA is generally detectable in upper respiratory specimens during the acute phase of infection. The lowest concentration of SARS-CoV-2 viral copies this assay can detect is 138 copies/mL. A negative result does not preclude SARS-Cov-2 infection and should not be used as the sole basis for treatment or other patient management decisions. A negative result may  occur with  improper specimen collection/handling, submission of specimen other than nasopharyngeal swab, presence of viral mutation(s) within the areas targeted by this assay, and inadequate number of viral copies(<138 copies/mL). A negative result must be combined with clinical observations, patient history, and epidemiological information. The expected result is Negative.  Fact Sheet for Patients:  EntrepreneurPulse.com.au  Fact Sheet for Healthcare Providers:  IncredibleEmployment.be  This test is no t yet approved or cleared by the [place] FDA and  has been authorized for detection and/or diagnosis of SARS-CoV-2 by FDA under an Emergency Use Authorization (EUA). This EUA will remain  in effect (meaning this test can be used) for the duration of the COVID-19 declaration under Section 564(b)(1) of the Act, 21 U.S.C.section 360bbb-3(b)(1), unless the authorization is terminated  or revoked sooner.       Influenza A by PCR NEGATIVE NEGATIVE Final   Influenza B by PCR NEGATIVE NEGATIVE Final    Comment: (NOTE) The Xpert Xpress SARS-CoV-2/FLU/RSV plus assay is intended as an aid in the diagnosis of influenza from Nasopharyngeal swab specimens and should not be used as a sole basis for treatment. Nasal washings and aspirates are unacceptable for Xpert Xpress SARS-CoV-2/FLU/RSV testing.  Fact Sheet for Patients: EntrepreneurPulse.com.au  Fact Sheet for Healthcare Providers: IncredibleEmployment.be  This test is not yet approved or cleared by the [place] FDA and has been authorized for detection and/or diagnosis of SARS-CoV-2 by FDA under an Emergency Use Authorization (EUA). This EUA will remain in effect (meaning this test can be used) for the duration of the COVID-19 declaration under Section 564(b)(1) of the Act, 21 U.S.C. section 360bbb-3(b)(1), unless the authorization is terminated  or revoked.  Performed at [facility], [address] [place], [place] [address]     Coagulation Studies: Recent Labs    12/01/20 0353  LABPROT 15.6*  INR 1.2    Urinalysis: No results for input(s): COLORURINE, LABSPEC, PHURINE, GLUCOSEU, HGBUR, BILIRUBINUR, KETONESUR, PROTEINUR, UROBILINOGEN, NITRITE, LEUKOCYTESUR in the last 72 hours.  Invalid input(s): APPERANCEUR    Imaging: DG Chest Port 1 View  Result Date: 12/01/2020 CLINICAL DATA:  Status post right thoracentesis. EXAM: PORTABLE CHEST 1 VIEW COMPARISON:  11/30/2020 FINDINGS: Small right pleural effusion. Moderate left pleural effusion persists. Patchy basilar collapse/consolidation noted bilaterally. No evidence for pneumothorax. Right IJ central line remains in place. Telemetry leads overlie the chest. IMPRESSION: No evidence for right-sided pneumothorax status post thoracentesis. Electronically Signed   By: Misty Stanley M.D.   On: 12/01/2020 12:04   DG Chest Port 1 View  Result Date: 11/30/2020 CLINICAL DATA:  Status post left thoracentesis. EXAM: PORTABLE CHEST 1 VIEW COMPARISON:  Chest x-ray earlier today at 1000 hours FINDINGS: Stable heart size and appearance of dialysis catheter. Reduction in left pleural fluid volume with residual fluid remaining which is likely partially loculated. Smaller right pleural effusion. Bilateral lower lobe atelectasis and probable component of pulmonary edema. No pneumothorax. IMPRESSION: Reduction in left pleural fluid volume with residual fluid remaining that is likely partially loculated. No pneumothorax. Small right pleural effusion. Probable component of pulmonary edema. Electronically Signed   By: Aletta Edouard  M.D.   On: 11/30/2020 12:36   DG Chest Port 1 View  Result Date: 11/30/2020 CLINICAL DATA:  Shortness of breath for 1 week. EXAM: PORTABLE CHEST 1 VIEW COMPARISON:  09/16/2020 FINDINGS: The right IJ dialysis catheter is stable. The cardiac silhouette, mediastinal  and hilar contours are within normal limits and stable. Moderate vascular congestion, interstitial pulmonary edema and bilateral pleural effusions, left larger than right. Overlying atelectasis is noted. IMPRESSION: Pulmonary edema and pleural effusions, left larger than right. Electronically Signed   By: Marijo Sanes M.D.   On: 11/30/2020 10:31   US THORACENTESIS ASP PLEURAL SPACE W/IMG GUIDE  Result Date: 12/01/2020 INDICATION: Right pleural effusion request received for diagnostic and therapeutic paracentesis. EXAM: ULTRASOUND GUIDED RIGHT THORACENTESIS MEDICATIONS: Local 1% lidocaine only. COMPLICATIONS: None immediate. PROCEDURE: An ultrasound guided thoracentesis was thoroughly discussed with the patient and questions answered. The benefits, risks, alternatives and complications were also discussed. The patient understands and wishes to proceed with the procedure. Written consent was obtained. Ultrasound was performed to localize and mark an adequate pocket of fluid in the right chest. The area was then prepped and draped in the normal sterile fashion. 1% Lidocaine was used for local anesthesia. Under ultrasound guidance a 19 gauge, 7-cm, Yueh catheter was introduced. Thoracentesis was performed. The catheter was removed and a dressing applied. FINDINGS: A total of approximately 1.2 L of amber colored fluid was removed. Samples were sent to the laboratory as requested by the clinical team. IMPRESSION: Successful ultrasound guided right thoracentesis yielding 1.2 L of pleural fluid. Read By: Tsosie Billing PA-C Electronically Signed   By: Sandi Mariscal M.D.   On: 12/01/2020 11:13   US THORACENTESIS ASP PLEURAL SPACE W/IMG GUIDE  Result Date: 11/30/2020 INDICATION: Bilateral pleural effusions request received for therapeutic thoracentesis. EXAM: ULTRASOUND GUIDED LEFT THORACENTESIS MEDICATIONS: Local 1% lidocaine only. COMPLICATIONS: None immediate. PROCEDURE: An ultrasound guided thoracentesis was  thoroughly discussed with the patient and questions answered. The benefits, risks, alternatives and complications were also discussed. The patient understands and wishes to proceed with the procedure. Written consent was obtained. Ultrasound was performed to localize and mark an adequate pocket of fluid in the left chest. The area was then prepped and draped in the normal sterile fashion. 1% Lidocaine was used for local anesthesia. Under ultrasound guidance a 19 gauge, 7-cm, Yueh catheter was introduced. Thoracentesis was performed. The catheter was removed and a dressing applied. FINDINGS: A total of approximately 1.2 L of dark bloody fluid was removed. IMPRESSION: Successful ultrasound guided left thoracentesis yielding 1.2 L of pleural fluid. Read By: Tsosie Billing PA-C Electronically Signed   By: Aletta Edouard M.D.   On: 11/30/2020 12:09     Medications:    sodium chloride     sodium chloride      amLODipine  10 mg Oral Daily   calcium acetate  1,334 mg Oral TID with meals   carvedilol  6.25 mg Oral BID WC   Chlorhexidine Gluconate Cloth  6 each Topical Q0600   cloNIDine  0.2 mg Transdermal Weekly   losartan  100 mg Oral Daily   minoxidil  5 mg Oral Daily   sodium chloride, sodium chloride, acetaminophen, albuterol, alteplase, guaiFENesin, heparin, hydrALAZINE, lidocaine (PF), lidocaine-prilocaine, ondansetron (ZOFRAN) IV, oxyCODONE-acetaminophen, pentafluoroprop-tetrafluoroeth  Assessment/ Plan:  Mr. Corell Orloff is a 36 y.o.  male with past medical history of hypertension, anemia and end stage renal disease on dialysis.  He presents to the emergency room after receiving outpatient dialysis treatment with  complaints of shortness of breath.  Patient is admitted for Shortness of breath [R06.02] Acute pulmonary edema (HCC) [J81.0] Pleural effusion [J90] S/P thoracentesis [Z98.890] Bilateral pleural effusion [J90] Pleural effusion on right [J90] Hypertensive urgency [I16.0] Acute  respiratory failure with hypoxia (HCC) [J96.01] Uncontrolled hypertension [I10] ESRD on hemodialysis (HCC) [N18.6, Z99.2]  [facility]/MWF/Rt Permcath  End stage renal disease with fluid overload on dialysis: Will maintain outpatient schedule, if possible.  Thoracentesis yesterday removed 1.2L fluid. We will plan for dialysis today to help manage fluid volume and hypertension.  We will also plan to dialyze tomorrow, to maintain outpatient schedule.  Anemia of chronic kidney disease Lab Results  Component Value Date   HGB 9.5 (L) 11/30/2020  Hgb at goal, will monitor  3. Secondary Hyperparathyroidism:  Lab Results  Component Value Date   CALCIUM 8.5 (L) 12/01/2020   Will monitor bone minerals during admission  4. Hypertension with chronic kidney disease:  Not controlled. Home regimen consists of Amlodipine, carvedilol, and losartan. Currently on amlodipine, carvedilol, and losartan.  We will add a clonidine patch 0.2 mg for improved control.  Primary team to include minoxidil 5 mg daily.  Hydralazine available as needed.  Educated patient on medication adherence and adjustments to medications are common while attempting to reach the desired blood pressure.  We will continue to monitor and make adjustments as needed    LOS: 1 [place] 9/8/20222:28 PM

## 2020-12-01 NOTE — Progress Notes (Signed)
Tx initiated without complication, cvc dressing change was preformed, will attempt to pull 1.5kg.

## 2020-12-01 NOTE — Progress Notes (Signed)
Tolerated 3 hours of HD treatment, UF goal of 1.5L achieved, report given to primary nurse

## 2020-12-01 NOTE — Procedures (Signed)
PROCEDURE SUMMARY:  Successful US guided right thoracentesis. Yielded 1.2 Liters of amber colored fluid. Pt tolerated procedure well. No immediate complications.  Specimen was sent for labs. CXR ordered.  EBL < 5 mL  Tsosie Billing D PA-C 12/01/2020 11:10 AM

## 2020-12-02 LAB — CBC
HCT: 28.2 % — ABNORMAL LOW (ref 39.0–52.0)
Hemoglobin: 9.5 g/dL — ABNORMAL LOW (ref 13.0–17.0)
MCH: 33.9 pg (ref 26.0–34.0)
MCHC: 33.7 g/dL (ref 30.0–36.0)
MCV: 100.7 fL — ABNORMAL HIGH (ref 80.0–100.0)
Platelets: 214 10*3/uL (ref 150–400)
RBC: 2.8 MIL/uL — ABNORMAL LOW (ref 4.22–5.81)
RDW: 14.8 % (ref 11.5–15.5)
WBC: 7.1 10*3/uL (ref 4.0–10.5)
nRBC: 0 % (ref 0.0–0.2)

## 2020-12-02 LAB — RENAL FUNCTION PANEL
Albumin: 3.2 g/dL — ABNORMAL LOW (ref 3.5–5.0)
Anion gap: 10 (ref 5–15)
BUN: 34 mg/dL — ABNORMAL HIGH (ref 6–20)
CO2: 28 mmol/L (ref 22–32)
Calcium: 8.3 mg/dL — ABNORMAL LOW (ref 8.9–10.3)
Chloride: 94 mmol/L — ABNORMAL LOW (ref 98–111)
Creatinine, Ser: 6.56 mg/dL — ABNORMAL HIGH (ref 0.61–1.24)
GFR, Estimated: 11 mL/min — ABNORMAL LOW (ref >60)
Glucose, Bld: 130 mg/dL — ABNORMAL HIGH (ref 70–99)
Phosphorus: 4.2 mg/dL (ref 2.5–4.6)
Potassium: 3.6 mmol/L (ref 3.5–5.1)
Sodium: 132 mmol/L — ABNORMAL LOW (ref 135–145)

## 2020-12-02 LAB — CYTOLOGY - NON PAP

## 2020-12-02 LAB — HEPATITIS B SURFACE ANTIBODY, QUANTITATIVE: Hep B S AB Quant (Post): 12.1 m[IU]/mL (ref >9.9)

## 2020-12-02 MED ORDER — CALCIUM ACETATE (PHOS BINDER) 667 MG PO CAPS: 1334.0000 mg | ORAL_CAPSULE | Freq: Three times a day (TID) | ORAL | 0 refills | Status: DC

## 2020-12-02 MED ORDER — LOSARTAN POTASSIUM 100 MG PO TABS: 100.0000 mg | ORAL_TABLET | Freq: Every day | ORAL | 0 refills | Status: DC

## 2020-12-02 MED ORDER — AMLODIPINE BESYLATE 10 MG PO TABS: 10.0000 mg | ORAL_TABLET | Freq: Every day | ORAL | 0 refills | Status: AC

## 2020-12-02 MED ORDER — MINOXIDIL 2.5 MG PO TABS: 5.0000 mg | ORAL_TABLET | Freq: Every day | ORAL | 0 refills | Status: AC

## 2020-12-02 MED ORDER — CARVEDILOL 6.25 MG PO TABS: 6.2500 mg | ORAL_TABLET | Freq: Two times a day (BID) | ORAL | 0 refills | Status: DC

## 2020-12-02 MED ORDER — CLONIDINE 0.2 MG/24HR TD PTWK: 0.2000 mg | MEDICATED_PATCH | TRANSDERMAL | 0 refills | Status: DC

## 2020-12-02 NOTE — Progress Notes (Signed)
[facility]  ROUNDING NOTE   Subjective:   Travis Long is a 35 y.o. male with past medical history of hypertension, anemia and end stage renal disease on dialysis.  He presents to the emergency room after receiving outpatient dialysis treatment with complaints of shortness of breath.  Patient is admitted for Shortness of breath [R06.02] Acute pulmonary edema (HCC) [J81.0] Pleural effusion [J90] S/P thoracentesis [Z98.890] Bilateral pleural effusion [J90] Pleural effusion on right [J90] Hypertensive urgency [I16.0] Acute respiratory failure with hypoxia ([place]) [J96.01] Uncontrolled hypertension [I10] ESRD on hemodialysis ([place]) [N18.6, Z99.2]  Patient known to our practice and receives dialysis at [facility] supervised by Dr Holley Raring.    Patient seen and evaluated during dialysis   HEMODIALYSIS FLOWSHEET:  Blood Flow Rate (mL/min): 200 mL/min Arterial Pressure (mmHg): -80 mmHg Venous Pressure (mmHg): 70 mmHg Transmembrane Pressure (mmHg): 30 mmHg Ultrafiltration Rate (mL/min): 70 mL/min Dialysate Flow Rate (mL/min): 500 ml/min Conductivity: Machine : 13.7 Conductivity: Machine : 13.7 Dialysis Fluid Bolus: Normal Saline Bolus Amount (mL): 250 mL  States he feels well today No other complaints at this time  Objective:  Vital signs in last 24 hours:  Temp:  [97.6 F (36.4 C)-99.3 F (37.4 C)] 99.3 F (37.4 C) (09/09 1253) Pulse Rate:  [79-93] 88 (09/09 1253) Resp:  [18-30] 18 (09/09 1253) BP: (83-155)/(54-108) 99/66 (09/09 1400) SpO2:  [96 %-100 %] 99 % (09/09 1253) Weight:  [82 kg] 82 kg (09/09 0105)  Weight change: -3.221 kg Filed Weights   11/30/20 0955 11/30/20 1845 12/02/20 0105  Weight: 85.2 kg 85.2 kg 82 kg    Intake/Output: I/O last 3 completed shifts: In: 720 [P.O.:720] Out: 1500 [Other:1500]   Intake/Output this shift:  Total I/O In: 480 [P.O.:480] Out: 1163 [Other:1163]  Physical Exam: General: NAD, laying in bed  Head:  Normocephalic, atraumatic. Moist oral mucosal membranes  Eyes: Anicteric  Lungs:  Clear to auscultation, normal effort  Heart: Regular rate and rhythm  Abdomen:  Soft, nontender  Extremities:  no peripheral edema.  Neurologic: Nonfocal, moving all four extremities  Skin: No lesions  Access: Rt IJ Permcath    Basic Metabolic Panel: Recent Labs  Lab 11/30/20 1001 12/01/20 0353 12/02/20 0834  NA 135 132* 132*  K 4.1 4.3 3.6  CL 95* 91* 94*  CO2 '25 24 28  '$ GLUCOSE 102* 108* 130*  BUN 42* 67* 34*  CREATININE 7.79* 11.34* 6.56*  CALCIUM 9.2 8.5* 8.3*  PHOS  --   --  4.2     Liver Function Tests: Recent Labs  Lab 12/02/20 0834  ALBUMIN 3.2*   No results for input(s): LIPASE, AMYLASE in the last 168 hours. No results for input(s): AMMONIA in the last 168 hours.  CBC: Recent Labs  Lab 11/30/20 1001 12/02/20 0834  WBC 8.1 7.1  HGB 9.5* 9.5*  HCT 27.5* 28.2*  MCV 99.3 100.7*  PLT 222 214     Cardiac Enzymes: No results for input(s): CKTOTAL, CKMB, CKMBINDEX, TROPONINI in the last 168 hours.  BNP: Invalid input(s): POCBNP  CBG: No results for input(s): GLUCAP in the last 168 hours.  Microbiology: Results for orders placed or performed during the hospital encounter of 11/30/20  Resp Panel by RT-PCR (Flu A&B, Covid) Nasopharyngeal Swab     Status: None   Collection Time: 11/30/20  9:56 AM   Specimen: Nasopharyngeal Swab; Nasopharyngeal(NP) swabs in vial transport medium  Result Value Ref Range Status   SARS Coronavirus 2 by RT PCR NEGATIVE NEGATIVE Final  Comment: (NOTE) SARS-CoV-2 target nucleic acids are NOT DETECTED.  The SARS-CoV-2 RNA is generally detectable in upper respiratory specimens during the acute phase of infection. The lowest concentration of SARS-CoV-2 viral copies this assay can detect is 138 copies/mL. A negative result does not preclude SARS-Cov-2 infection and should not be used as the sole basis for treatment or other patient  management decisions. A negative result may occur with  improper specimen collection/handling, submission of specimen other than nasopharyngeal swab, presence of viral mutation(s) within the areas targeted by this assay, and inadequate number of viral copies(<138 copies/mL). A negative result must be combined with clinical observations, patient history, and epidemiological information. The expected result is Negative.  Fact Sheet for Patients:  EntrepreneurPulse.com.au  Fact Sheet for Healthcare Providers:  IncredibleEmployment.be  This test is no t yet approved or cleared by the [place] FDA and  has been authorized for detection and/or diagnosis of SARS-CoV-2 by FDA under an Emergency Use Authorization (EUA). This EUA will remain  in effect (meaning this test can be used) for the duration of the COVID-19 declaration under Section 564(b)(1) of the Act, 21 U.S.C.section 360bbb-3(b)(1), unless the authorization is terminated  or revoked sooner.       Influenza A by PCR NEGATIVE NEGATIVE Final   Influenza B by PCR NEGATIVE NEGATIVE Final    Comment: (NOTE) The Xpert Xpress SARS-CoV-2/FLU/RSV plus assay is intended as an aid in the diagnosis of influenza from Nasopharyngeal swab specimens and should not be used as a sole basis for treatment. Nasal washings and aspirates are unacceptable for Xpert Xpress SARS-CoV-2/FLU/RSV testing.  Fact Sheet for Patients: EntrepreneurPulse.com.au  Fact Sheet for Healthcare Providers: IncredibleEmployment.be  This test is not yet approved or cleared by the [place] FDA and has been authorized for detection and/or diagnosis of SARS-CoV-2 by FDA under an Emergency Use Authorization (EUA). This EUA will remain in effect (meaning this test can be used) for the duration of the COVID-19 declaration under Section 564(b)(1) of the Act, 21 U.S.C. section 360bbb-3(b)(1),  unless the authorization is terminated or revoked.  Performed at [facility], [address] [place], [place] [address]     Coagulation Studies: Recent Labs    12/01/20 0353  LABPROT 15.6*  INR 1.2     Urinalysis: No results for input(s): COLORURINE, LABSPEC, PHURINE, GLUCOSEU, HGBUR, BILIRUBINUR, KETONESUR, PROTEINUR, UROBILINOGEN, NITRITE, LEUKOCYTESUR in the last 72 hours.  Invalid input(s): APPERANCEUR    Imaging: DG Chest Port 1 View  Result Date: 12/01/2020 CLINICAL DATA:  Status post right thoracentesis. EXAM: PORTABLE CHEST 1 VIEW COMPARISON:  11/30/2020 FINDINGS: Small right pleural effusion. Moderate left pleural effusion persists. Patchy basilar collapse/consolidation noted bilaterally. No evidence for pneumothorax. Right IJ central line remains in place. Telemetry leads overlie the chest. IMPRESSION: No evidence for right-sided pneumothorax status post thoracentesis. Electronically Signed   By: Misty Stanley M.D.   On: 12/01/2020 12:04   US THORACENTESIS ASP PLEURAL SPACE W/IMG GUIDE  Result Date: 12/01/2020 INDICATION: Right pleural effusion request received for diagnostic and therapeutic paracentesis. EXAM: ULTRASOUND GUIDED RIGHT THORACENTESIS MEDICATIONS: Local 1% lidocaine only. COMPLICATIONS: None immediate. PROCEDURE: An ultrasound guided thoracentesis was thoroughly discussed with the patient and questions answered. The benefits, risks, alternatives and complications were also discussed. The patient understands and wishes to proceed with the procedure. Written consent was obtained. Ultrasound was performed to localize and mark an adequate pocket of fluid in the right chest. The area was then prepped and draped in the normal  sterile fashion. 1% Lidocaine was used for local anesthesia. Under ultrasound guidance a 19 gauge, 7-cm, Yueh catheter was introduced. Thoracentesis was performed. The catheter was removed and a dressing applied. FINDINGS: A total of  approximately 1.2 L of amber colored fluid was removed. Samples were sent to the laboratory as requested by the clinical team. IMPRESSION: Successful ultrasound guided right thoracentesis yielding 1.2 L of pleural fluid. Read By: Tsosie Billing PA-C Electronically Signed   By: Sandi Mariscal M.D.   On: 12/01/2020 11:13     Medications:      amLODipine  10 mg Oral Daily   calcium acetate  1,334 mg Oral TID with meals   carvedilol  6.25 mg Oral BID WC   Chlorhexidine Gluconate Cloth  6 each Topical Q0600   losartan  100 mg Oral Daily   minoxidil  5 mg Oral Daily   acetaminophen, albuterol, guaiFENesin, hydrALAZINE, ondansetron (ZOFRAN) IV, oxyCODONE-acetaminophen  Assessment/ Plan:  Travis Long is a 35 y.o.  male with past medical history of hypertension, anemia and end stage renal disease on dialysis.  He presents to the emergency room after receiving outpatient dialysis treatment with complaints of shortness of breath.  Patient is admitted for Shortness of breath [R06.02] Acute pulmonary edema (HCC) [J81.0] Pleural effusion [J90] S/P thoracentesis [Z98.890] Bilateral pleural effusion [J90] Pleural effusion on right [J90] Hypertensive urgency [I16.0] Acute respiratory failure with hypoxia (HCC) [J96.01] Uncontrolled hypertension [I10] ESRD on hemodialysis (HCC) [N18.6, Z99.2]  [facility]/MWF/Rt Permcath  End stage renal disease with fluid overload on dialysis: Received dialysis yesterday, UF goal 1.5L achieved. Received dialysis today, UF goal 1.5L decreased due to hypotension. UF 1.1L achieved. Next treatment scheduled for Monday. Cleared to discharge from renal stance and can resume outpatient treatment at clinic  Anemia of chronic kidney disease Lab Results  Component Value Date   HGB 9.5 (L) 12/02/2020  Hgb at goal  3. Secondary Hyperparathyroidism:  Lab Results  Component Value Date   CALCIUM 8.3 (L) 12/02/2020   PHOS 4.2 12/02/2020   Will monitor bone  minerals during admission Phosphorus at goal  4. Hypertension with chronic kidney disease:  Not controlled. Home regimen consists of Amlodipine, carvedilol, and losartan. Currently on amlodipine, carvedilol, and losartan.  Improved control with prescribed medications. BP 118/76 Clonidine patch 0.2 mg applied yesterday along with minoxidil 5 mg daily.  Hydralazine available as needed. We encouraged patient to adhere to medication regimen.      LOS: 2 [place] 9/9/20222:15 PM

## 2020-12-02 NOTE — Progress Notes (Signed)
BP dropped UF  off and NS given , RN and doctor aware.

## 2020-12-02 NOTE — Progress Notes (Signed)
Patient reporting lightheadedness with return from HD. Clonidine patch removed. BP down to 99/66; 118/76 rechecked while lying down. Pt chronically hypertensive previously.  Patient already with discharge orders and ride on their way. Provider notified of patient's condition and encouraged patient to stay. However, he did not want his ride to have to wait for him and was confident he could "sleep it off" at home. Provider d/c'd clonidine patch for home and verbally told the patient to take half of the minoxidil as ordered. No other changes or parameters given.  The patient does have a BP cuff at home and will take pressures before taking new meds. Pressures to be recorded as well as any medications skipped.  Remainder of discharge paperwork reviewed. Medications sent to home pharmacy. Patient to follow up with PCP. PIV removed and patient left via wheelchair to meet his ride downstairs.

## 2020-12-02 NOTE — Progress Notes (Signed)
Spoke with Verdis Frederickson in central telemetry at [facility] to transfer patient from room 237 to [facility].

## 2020-12-02 NOTE — Discharge Summary (Addendum)
Physician Discharge Summary  Patient ID: Travis Long MRN: [id number] DOB/AGE: 1985/05/06 35 y.o.  Admit date: 11/30/2020 Discharge date: 12/02/2020  Admission Diagnoses:  Discharge Diagnoses:  Principal Problem:   Acute pulmonary edema ([place]) Active Problems:   Acute respiratory failure with hypoxia ([place])   Hypertensive emergency   ESRD on hemodialysis (Travis Long)   Hypertension   Anemia in ESRD (end-stage renal disease) ([place])   Tobacco abuse   Bilateral pleural effusion   Discharged Condition: good  Hospital Course:  Travis Long is a 35 y.o. male with medical history significant of ESRD-HD (MWF), hypertension, tobacco abuse, anemia, who presents with shortness of breath. Patient has not been compliant with his blood pressure medicines, he has not been taking them for a year.  He came back to the hospital with a severe volume overload and hypertension emergency.  He was emergently dialyzed and also removed 1.4 L of pleural fluid from the left, 1.2 L on the right.  #1.  Acute respiratory failure with hypoxemia secondary to acute pulmonary edema. Acute on chronic diastolic congestive heart failure. Hypertension emergency. End-stage renal disease. Patient condition appears to be secondary to hypertensive emergency.  He has not been compliant with his blood pressure medicines.  He states that he has not been taking it for a year due to the fact that he does not believe the medicines are working.  His blood pressure did not decrease at all when he was taking his home medicines. After admission to the hospital, we continued prior medicines of blood for blood pressure, also added clonidine and minoxidil. Patient is also dialyzed x3 days.  Blood pressure is better, but still high.  At this point, we will continue home medicine as well as clonidine and minoxidil. I have asked  social work to set up a PCP follow-up in the near future. I have prescribed all blood pressure medicines and sent to the  pharmacy   #2.  Bilateral pleural effusions. Patient had thoracentesis on both side.  Patient has increased WBC in the pleural fluid, majority monocytes-macrophage.  Not consistent with infection.  Cytology is sent out, please to follow-up with results.   Addendum: Bp is lower, discontinued clonidine patch. I have also called pharmacy to cancel.    Consults: nephrology  Significant Diagnostic Studies:   Treatments: HD, clonidine, minoxidil  Discharge Exam: Blood pressure (!) 155/97, pulse 85, temperature 98.7 F (37.1 C), resp. rate 18, height '5\' 9"'$  (1.753 m), weight 82 kg, SpO2 100 %. General appearance: alert and cooperative Resp: clear to auscultation bilaterally Cardio: regular rate and rhythm, S1, S2 normal, no murmur, click, rub or gallop GI: soft, non-tender; bowel sounds normal; no masses,  no organomegaly Extremities: extremities normal, atraumatic, no cyanosis or edema  Disposition: Discharge disposition: 01-Home or Self Care       Discharge Instructions     Diet general   Complete by: As directed    Renal diet   Increase activity slowly   Complete by: As directed       Allergies as of 12/02/2020   No Known Allergies      Medication List     TAKE these medications    amLODipine 10 MG tablet Commonly known as: NORVASC Take 1 tablet (10 mg total) by mouth daily.   calcium acetate 667 MG capsule Commonly known as: PHOSLO Take 1,334 mg by mouth 3 (three) times daily.   carvedilol 6.25 MG tablet Commonly known as: COREG Take 1 tablet (6.25 mg total) by  mouth in the morning and at bedtime.   cloNIDine 0.2 mg/24hr patch Commonly known as: CATAPRES - Dosed in mg/24 hr Place 1 patch (0.2 mg total) onto the skin once a week. Start taking on: December 08, 2020   lidocaine-prilocaine cream Commonly known as: EMLA Apply 1 application topically as needed (for dialysis).   losartan 100 MG tablet Commonly known as: COZAAR Take 1 tablet (100 mg total)  by mouth daily.   minoxidil 2.5 MG tablet Commonly known as: LONITEN Take 2 tablets (5 mg total) by mouth daily. Start taking on: December 03, 2020        34 minutes Signed: Sharen Hones 12/02/2020, 10:19 AM

## 2020-12-02 NOTE — Progress Notes (Signed)
Patient requested to come off machine, stating he did not feel good with a remainder of 30 minutes left. Patient was rinsed back with NS with a completed net goal of 1163, RN and  doctor aware.

## 2020-12-13 LAB — MISC LABCORP TEST (SEND OUT): Labcorp test code: 9985

## 2021-03-06 ENCOUNTER — Inpatient Hospital Stay
Admission: EM | Admit: 2021-03-06 | Discharge: 2021-03-11 | DRG: 291 | Disposition: A | Discharge status: Other Acute Inpatient Hospital | Payer: Medicare Other | Source: Ambulatory Visit | Attending: Internal Medicine | Admitting: Internal Medicine

## 2021-03-06 ENCOUNTER — Emergency Department: Payer: Medicare Other

## 2021-03-06 ENCOUNTER — Other Ambulatory Visit: Payer: Self-pay

## 2021-03-06 ENCOUNTER — Inpatient Hospital Stay: Payer: Medicare Other

## 2021-03-06 ENCOUNTER — Encounter: Payer: Self-pay | Admitting: Emergency Medicine

## 2021-03-06 DIAGNOSIS — Z833 Family history of diabetes mellitus: Secondary | ICD-10-CM

## 2021-03-06 DIAGNOSIS — F430 Acute stress reaction: Secondary | ICD-10-CM | POA: Diagnosis not present

## 2021-03-06 DIAGNOSIS — F4320 Adjustment disorder, unspecified: Secondary | ICD-10-CM | POA: Diagnosis not present

## 2021-03-06 DIAGNOSIS — Z87891 Personal history of nicotine dependence: Secondary | ICD-10-CM

## 2021-03-06 DIAGNOSIS — I5033 Acute on chronic diastolic (congestive) heart failure: Secondary | ICD-10-CM | POA: Diagnosis present

## 2021-03-06 DIAGNOSIS — J9601 Acute respiratory failure with hypoxia: Secondary | ICD-10-CM | POA: Diagnosis present

## 2021-03-06 DIAGNOSIS — I132 Hypertensive heart and chronic kidney disease with heart failure and with stage 5 chronic kidney disease, or end stage renal disease: Secondary | ICD-10-CM | POA: Diagnosis not present

## 2021-03-06 DIAGNOSIS — D631 Anemia in chronic kidney disease: Secondary | ICD-10-CM | POA: Diagnosis present

## 2021-03-06 DIAGNOSIS — F4325 Adjustment disorder with mixed disturbance of emotions and conduct: Secondary | ICD-10-CM | POA: Diagnosis present

## 2021-03-06 DIAGNOSIS — N2581 Secondary hyperparathyroidism of renal origin: Secondary | ICD-10-CM | POA: Diagnosis present

## 2021-03-06 DIAGNOSIS — I161 Hypertensive emergency: Secondary | ICD-10-CM | POA: Diagnosis present

## 2021-03-06 DIAGNOSIS — Z91128 Patient's intentional underdosing of medication regimen for other reason: Secondary | ICD-10-CM

## 2021-03-06 DIAGNOSIS — J90 Pleural effusion, not elsewhere classified: Secondary | ICD-10-CM | POA: Diagnosis not present

## 2021-03-06 DIAGNOSIS — I509 Heart failure, unspecified: Secondary | ICD-10-CM

## 2021-03-06 DIAGNOSIS — J869 Pyothorax without fistula: Secondary | ICD-10-CM

## 2021-03-06 DIAGNOSIS — T465X6A Underdosing of other antihypertensive drugs, initial encounter: Secondary | ICD-10-CM | POA: Diagnosis present

## 2021-03-06 DIAGNOSIS — Z8249 Family history of ischemic heart disease and other diseases of the circulatory system: Secondary | ICD-10-CM | POA: Diagnosis not present

## 2021-03-06 DIAGNOSIS — J189 Pneumonia, unspecified organism: Secondary | ICD-10-CM | POA: Diagnosis present

## 2021-03-06 DIAGNOSIS — R0602 Shortness of breath: Secondary | ICD-10-CM | POA: Diagnosis present

## 2021-03-06 DIAGNOSIS — J948 Other specified pleural conditions: Secondary | ICD-10-CM | POA: Diagnosis not present

## 2021-03-06 DIAGNOSIS — D649 Anemia, unspecified: Secondary | ICD-10-CM | POA: Diagnosis not present

## 2021-03-06 DIAGNOSIS — J918 Pleural effusion in other conditions classified elsewhere: Secondary | ICD-10-CM | POA: Diagnosis present

## 2021-03-06 DIAGNOSIS — Z992 Dependence on renal dialysis: Secondary | ICD-10-CM

## 2021-03-06 DIAGNOSIS — N186 End stage renal disease: Secondary | ICD-10-CM | POA: Diagnosis present

## 2021-03-06 DIAGNOSIS — Z720 Tobacco use: Secondary | ICD-10-CM | POA: Diagnosis present

## 2021-03-06 DIAGNOSIS — I160 Hypertensive urgency: Secondary | ICD-10-CM | POA: Diagnosis present

## 2021-03-06 DIAGNOSIS — Z79899 Other long term (current) drug therapy: Secondary | ICD-10-CM

## 2021-03-06 DIAGNOSIS — T82898A Other specified complication of vascular prosthetic devices, implants and grafts, initial encounter: Secondary | ICD-10-CM | POA: Diagnosis not present

## 2021-03-06 DIAGNOSIS — G9340 Encephalopathy, unspecified: Secondary | ICD-10-CM | POA: Diagnosis not present

## 2021-03-06 DIAGNOSIS — Z20822 Contact with and (suspected) exposure to covid-19: Secondary | ICD-10-CM | POA: Diagnosis present

## 2021-03-06 DIAGNOSIS — R0902 Hypoxemia: Secondary | ICD-10-CM

## 2021-03-06 DIAGNOSIS — J9819 Other pulmonary collapse: Secondary | ICD-10-CM | POA: Diagnosis not present

## 2021-03-06 DIAGNOSIS — Z91199 Patient's noncompliance with other medical treatment and regimen due to unspecified reason: Secondary | ICD-10-CM

## 2021-03-06 DIAGNOSIS — I5031 Acute diastolic (congestive) heart failure: Secondary | ICD-10-CM | POA: Diagnosis not present

## 2021-03-06 LAB — BODY FLUID CELL COUNT WITH DIFFERENTIAL
Eos, Fluid: 4 %
Lymphs, Fluid: 64 %
Monocyte-Macrophage-Serous Fluid: 11 %
Neutrophil Count, Fluid: 20 %
Other Cells, Fluid: 1 %
Total Nucleated Cell Count, Fluid: 421 cu mm

## 2021-03-06 LAB — COMPREHENSIVE METABOLIC PANEL
ALT: 10 U/L (ref 0–44)
AST: 18 U/L (ref 15–41)
Albumin: 3.8 g/dL (ref 3.5–5.0)
Alkaline Phosphatase: 57 U/L (ref 38–126)
Anion gap: 14 (ref 5–15)
BUN: 74 mg/dL — ABNORMAL HIGH (ref 6–20)
CO2: 23 mmol/L (ref 22–32)
Calcium: 8.8 mg/dL — ABNORMAL LOW (ref 8.9–10.3)
Chloride: 96 mmol/L — ABNORMAL LOW (ref 98–111)
Creatinine, Ser: 18.63 mg/dL — ABNORMAL HIGH (ref 0.61–1.24)
GFR, Estimated: 3 mL/min — ABNORMAL LOW (ref >60)
Glucose, Bld: 137 mg/dL — ABNORMAL HIGH (ref 70–99)
Potassium: 4.7 mmol/L (ref 3.5–5.1)
Sodium: 133 mmol/L — ABNORMAL LOW (ref 135–145)
Total Bilirubin: 0.9 mg/dL (ref 0.3–1.2)
Total Protein: 8.9 g/dL — ABNORMAL HIGH (ref 6.5–8.1)

## 2021-03-06 LAB — CBC WITH DIFFERENTIAL/PLATELET
Abs Immature Granulocytes: 0.04 10*3/uL (ref 0.00–0.07)
Basophils Absolute: 0 10*3/uL (ref 0.0–0.1)
Basophils Relative: 0 %
Eosinophils Absolute: 0.6 10*3/uL — ABNORMAL HIGH (ref 0.0–0.5)
Eosinophils Relative: 6 %
HCT: 21.6 % — ABNORMAL LOW (ref 39.0–52.0)
Hemoglobin: 6.9 g/dL — ABNORMAL LOW (ref 13.0–17.0)
Immature Granulocytes: 0 %
Lymphocytes Relative: 9 %
Lymphs Abs: 0.9 10*3/uL (ref 0.7–4.0)
MCH: 29.2 pg (ref 26.0–34.0)
MCHC: 31.9 g/dL (ref 30.0–36.0)
MCV: 91.5 fL (ref 80.0–100.0)
Monocytes Absolute: 0.6 10*3/uL (ref 0.1–1.0)
Monocytes Relative: 6 %
Neutro Abs: 7.3 10*3/uL (ref 1.7–7.7)
Neutrophils Relative %: 79 %
Platelets: 213 10*3/uL (ref 150–400)
RBC: 2.36 MIL/uL — ABNORMAL LOW (ref 4.22–5.81)
RDW: 14.8 % (ref 11.5–15.5)
WBC: 9.5 10*3/uL (ref 4.0–10.5)
nRBC: 0 % (ref 0.0–0.2)

## 2021-03-06 LAB — ALBUMIN, PLEURAL OR PERITONEAL FLUID: Albumin, Fluid: 2.1 g/dL

## 2021-03-06 LAB — LACTATE DEHYDROGENASE, PLEURAL OR PERITONEAL FLUID: LD, Fluid: 170 U/L — ABNORMAL HIGH (ref 3–23)

## 2021-03-06 LAB — RESP PANEL BY RT-PCR (FLU A&B, COVID) ARPGX2
Influenza A by PCR: NEGATIVE
Influenza B by PCR: NEGATIVE
SARS Coronavirus 2 by RT PCR: NEGATIVE

## 2021-03-06 LAB — ABO/RH: ABO/RH(D): O POS

## 2021-03-06 LAB — PREPARE RBC (CROSSMATCH)

## 2021-03-06 LAB — TROPONIN I (HIGH SENSITIVITY)
Troponin I (High Sensitivity): 79 ng/L — ABNORMAL HIGH (ref <18)
Troponin I (High Sensitivity): 97 ng/L — ABNORMAL HIGH (ref <18)

## 2021-03-06 LAB — HEPATITIS B SURFACE ANTIGEN: Hepatitis B Surface Ag: NONREACTIVE

## 2021-03-06 LAB — PROTEIN, PLEURAL OR PERITONEAL FLUID: Total protein, fluid: 4.5 g/dL

## 2021-03-06 LAB — GLUCOSE, PLEURAL OR PERITONEAL FLUID: Glucose, Fluid: 70 mg/dL

## 2021-03-06 LAB — BRAIN NATRIURETIC PEPTIDE: B Natriuretic Peptide: 1079.2 pg/mL — ABNORMAL HIGH (ref 0.0–100.0)

## 2021-03-06 MED ORDER — ONDANSETRON HCL 4 MG PO TABS: 4.0000 mg | ORAL_TABLET | Freq: Four times a day (QID) | ORAL | Status: DC | PRN

## 2021-03-06 MED ORDER — SODIUM CHLORIDE 0.9% FLUSH
3.0000 mL | Freq: Two times a day (BID) | INTRAVENOUS | Status: DC
  Administered 2021-03-06 – 2021-03-09 (×6): 3 mL via INTRAVENOUS

## 2021-03-06 MED ORDER — SODIUM CHLORIDE 0.9% IV SOLUTION
Freq: Once | INTRAVENOUS | Status: DC
  Filled 2021-03-06: qty 250

## 2021-03-06 MED ORDER — SODIUM CHLORIDE 0.9% FLUSH: 3.0000 mL | INTRAVENOUS | Status: DC | PRN

## 2021-03-06 MED ORDER — MINOXIDIL 2.5 MG PO TABS
5.0000 mg | ORAL_TABLET | Freq: Every day | ORAL | Status: DC
  Administered 2021-03-06 – 2021-03-09 (×2): 5 mg via ORAL
  Filled 2021-03-06 (×7): qty 2

## 2021-03-06 MED ORDER — CLONIDINE HCL 0.1 MG PO TABS
0.2000 mg | ORAL_TABLET | Freq: Once | ORAL | Status: AC
  Administered 2021-03-06: 0.2 mg via ORAL
  Filled 2021-03-06: qty 2

## 2021-03-06 MED ORDER — SODIUM CHLORIDE 0.9 % IV SOLN
2.0000 g | Freq: Every day | INTRAVENOUS | Status: AC
  Administered 2021-03-06: 2 g via INTRAVENOUS
  Filled 2021-03-06: qty 20

## 2021-03-06 MED ORDER — PENTAFLUOROPROP-TETRAFLUOROETH EX AERO
1.0000 "application " | INHALATION_SPRAY | CUTANEOUS | Status: DC | PRN
  Filled 2021-03-06: qty 30

## 2021-03-06 MED ORDER — ACETAMINOPHEN 325 MG PO TABS: 650.0000 mg | ORAL_TABLET | Freq: Four times a day (QID) | ORAL | Status: DC | PRN

## 2021-03-06 MED ORDER — SODIUM CHLORIDE 0.9 % IV SOLN
3.0000 g | Freq: Two times a day (BID) | INTRAVENOUS | Status: DC
  Administered 2021-03-06 – 2021-03-10 (×8): 3 g via INTRAVENOUS
  Filled 2021-03-06 (×3): qty 3
  Filled 2021-03-06: qty 8
  Filled 2021-03-06: qty 3
  Filled 2021-03-06 (×2): qty 8
  Filled 2021-03-06 (×3): qty 3

## 2021-03-06 MED ORDER — HEPARIN SODIUM (PORCINE) 1000 UNIT/ML IJ SOLN
INTRAMUSCULAR | Status: AC
  Administered 2021-03-06: 3200 [IU]
  Filled 2021-03-06: qty 10

## 2021-03-06 MED ORDER — ACETAMINOPHEN 650 MG RE SUPP
650.0000 mg | Freq: Four times a day (QID) | RECTAL | Status: DC | PRN
  Filled 2021-03-06: qty 1

## 2021-03-06 MED ORDER — CLONIDINE HCL 0.1 MG PO TABS
ORAL_TABLET | ORAL | Status: AC
  Filled 2021-03-06: qty 2

## 2021-03-06 MED ORDER — LABETALOL HCL 5 MG/ML IV SOLN
10.0000 mg | Freq: Once | INTRAVENOUS | Status: AC
  Administered 2021-03-06: 10 mg via INTRAVENOUS
  Filled 2021-03-06: qty 4

## 2021-03-06 MED ORDER — SODIUM CHLORIDE 0.9 % IV SOLN
250.0000 mL | INTRAVENOUS | Status: DC | PRN
  Administered 2021-03-09 – 2021-03-10 (×3): 250 mL via INTRAVENOUS

## 2021-03-06 MED ORDER — LIDOCAINE HCL (PF) 1 % IJ SOLN
5.0000 mL | INTRAMUSCULAR | Status: DC | PRN
  Filled 2021-03-06: qty 5

## 2021-03-06 MED ORDER — LIDOCAINE-PRILOCAINE 2.5-2.5 % EX CREA: 1.0000 "application " | TOPICAL_CREAM | CUTANEOUS | Status: DC | PRN

## 2021-03-06 MED ORDER — ALTEPLASE 2 MG IJ SOLR
2.0000 mg | Freq: Once | INTRAMUSCULAR | Status: DC | PRN
  Filled 2021-03-06: qty 2

## 2021-03-06 MED ORDER — SODIUM CHLORIDE 0.9 % IV SOLN
500.0000 mg | Freq: Every day | INTRAVENOUS | Status: AC
  Administered 2021-03-06: 500 mg via INTRAVENOUS
  Filled 2021-03-06: qty 5

## 2021-03-06 MED ORDER — LOSARTAN POTASSIUM 50 MG PO TABS
100.0000 mg | ORAL_TABLET | Freq: Every day | ORAL | Status: DC
  Administered 2021-03-06 – 2021-03-09 (×3): 100 mg via ORAL
  Filled 2021-03-06 (×4): qty 2

## 2021-03-06 MED ORDER — CLONIDINE HCL 0.1 MG PO TABS
0.2000 mg | ORAL_TABLET | Freq: Once | ORAL | Status: AC
  Administered 2021-03-06: 0.2 mg via ORAL

## 2021-03-06 MED ORDER — SODIUM CHLORIDE 0.9 % IV SOLN: 100.0000 mL | INTRAVENOUS | Status: DC | PRN

## 2021-03-06 MED ORDER — CHLORHEXIDINE GLUCONATE CLOTH 2 % EX PADS
6.0000 | MEDICATED_PAD | Freq: Every day | CUTANEOUS | Status: DC
  Filled 2021-03-06 (×2): qty 6

## 2021-03-06 MED ORDER — CALCIUM ACETATE (PHOS BINDER) 667 MG PO CAPS
1334.0000 mg | ORAL_CAPSULE | Freq: Three times a day (TID) | ORAL | Status: DC
  Administered 2021-03-07 – 2021-03-09 (×5): 1334 mg via ORAL
  Filled 2021-03-06 (×12): qty 2

## 2021-03-06 MED ORDER — AMLODIPINE BESYLATE 10 MG PO TABS
10.0000 mg | ORAL_TABLET | Freq: Every day | ORAL | Status: DC
  Administered 2021-03-06 – 2021-03-09 (×3): 10 mg via ORAL
  Filled 2021-03-06: qty 1
  Filled 2021-03-06: qty 2
  Filled 2021-03-06 (×2): qty 1

## 2021-03-06 MED ORDER — HYDROMORPHONE HCL 1 MG/ML IJ SOLN
0.5000 mg | Freq: Once | INTRAMUSCULAR | Status: AC
  Administered 2021-03-06: 0.5 mg via INTRAVENOUS
  Filled 2021-03-06: qty 1

## 2021-03-06 MED ORDER — ONDANSETRON HCL 4 MG/2ML IJ SOLN
4.0000 mg | Freq: Four times a day (QID) | INTRAMUSCULAR | Status: DC | PRN
  Administered 2021-03-08 – 2021-03-09 (×2): 4 mg via INTRAVENOUS
  Filled 2021-03-06 (×2): qty 2

## 2021-03-06 MED ORDER — CARVEDILOL 6.25 MG PO TABS
6.2500 mg | ORAL_TABLET | Freq: Two times a day (BID) | ORAL | Status: DC
  Administered 2021-03-06 – 2021-03-07 (×2): 6.25 mg via ORAL
  Filled 2021-03-06 (×3): qty 1

## 2021-03-06 NOTE — ED Notes (Signed)
Pt sats on RA - 86-87% - pt piaced on 3 LNC

## 2021-03-06 NOTE — Progress Notes (Signed)
[facility]  ROUNDING NOTE   Subjective:   Travis Long is a 35 y.o. male with a past medical history significant for hypertension and end-stage renal disease on dialysis.  Patient presents to the emergency department from his outpatient dialysis clinic with complaints of worsening shortness of breath and elevated blood pressure.  He has been admitted for Hypertensive urgency [I16.0]  Patient is known to our clinic and receives outpatient dialysis treatments at [facility], supervised by Dr. Holley Raring.  He reports his last dialysis treatment on this past Friday, but missed a treatment last week.  Patient states he has been noncompliant with blood pressure medications.  Reports shortness of breath was initially on exertion but now occurs during rest also.  He denies chest pain and lower extremity edema.  He states he continues to maintain adequate dietary and fluid restrictions due to dialysis.  Denies nausea, vomiting, and diarrhea.  Also denies cough.  Blood pressure on arrival to the emergency department was 207/129.  Other notable labs on arrival include sodium 133, potassium 4.7, glucose 137, BUN 74, creatinine 18.63 with GFR 3, BNP greater than 1,000 and hemoglobin 6.9.  CT chest shows lower left lobe consolidation consistent with acute pneumonia, small right pleural  effusion and scattered small mediastinal lymph nodes.  We have been consulted to manage dialysis needs during this admission.  Objective:  Vital signs in last 24 hours:  Temp:  [97.9 F (36.6 C)] 97.9 F (36.6 C) (12/12 0634) Pulse Rate:  [88-112] 92 (12/12 1300) Resp:  [18-37] 36 (12/12 1300) BP: (150-221)/(22-155) 150/108 (12/12 1300) SpO2:  [92 %-100 %] 94 % (12/12 1300) Weight:  [81.6 kg] 81.6 kg (12/12 0636)  Weight change:  Filed Weights   03/06/21 0636  Weight: 81.6 kg    Intake/Output: No intake/output data recorded.   Intake/Output this shift:  No intake/output data  recorded.  Physical Exam: General: NAD, appears uncomfortable, ill-appearing  Head: Normocephalic, atraumatic. Moist oral mucosal membranes  Eyes: Anicteric  Lungs:  Basilar crackles, normal effort, 2 L nasal cannula  Heart: Regular rate and rhythm  Abdomen:  Soft, nontender, nondistended  Extremities: No peripheral edema.  Neurologic: Nonfocal, moving all four extremities  Skin: No lesions  Access: Right chest PermCath, LUE aVF    Basic Metabolic Panel: Recent Labs  Lab 03/06/21 0643  NA 133*  K 4.7  CL 96*  CO2 23  GLUCOSE 137*  BUN 74*  CREATININE 18.63*  CALCIUM 8.8*    Liver Function Tests: Recent Labs  Lab 03/06/21 0643  AST 18  ALT 10  ALKPHOS 57  BILITOT 0.9  PROT 8.9*  ALBUMIN 3.8   No results for input(s): LIPASE, AMYLASE in the last 168 hours. No results for input(s): AMMONIA in the last 168 hours.  CBC: Recent Labs  Lab 03/06/21 0643  WBC 9.5  NEUTROABS 7.3  HGB 6.9*  HCT 21.6*  MCV 91.5  PLT 213    Cardiac Enzymes: No results for input(s): CKTOTAL, CKMB, CKMBINDEX, TROPONINI in the last 168 hours.  BNP: Invalid input(s): POCBNP  CBG: No results for input(s): GLUCAP in the last 168 hours.  Microbiology: Results for orders placed or performed during the hospital encounter of 03/06/21  Resp Panel by RT-PCR (Flu A&B, Covid) Nasopharyngeal Swab     Status: None   Collection Time: 03/06/21  6:45 AM   Specimen: Nasopharyngeal Swab; Nasopharyngeal(NP) swabs in vial transport medium  Result Value Ref Range Status   SARS Coronavirus 2 by RT  PCR NEGATIVE NEGATIVE Final    Comment: (NOTE) SARS-CoV-2 target nucleic acids are NOT DETECTED.  The SARS-CoV-2 RNA is generally detectable in upper respiratory specimens during the acute phase of infection. The lowest concentration of SARS-CoV-2 viral copies this assay can detect is 138 copies/mL. A negative result does not preclude SARS-Cov-2 infection and should not be used as the sole basis for  treatment or other patient management decisions. A negative result may occur with  improper specimen collection/handling, submission of specimen other than nasopharyngeal swab, presence of viral mutation(s) within the areas targeted by this assay, and inadequate number of viral copies(<138 copies/mL). A negative result must be combined with clinical observations, patient history, and epidemiological information. The expected result is Negative.  Fact Sheet for Patients:  EntrepreneurPulse.com.au  Fact Sheet for Healthcare Providers:  IncredibleEmployment.be  This test is no t yet approved or cleared by the [place] FDA and  has been authorized for detection and/or diagnosis of SARS-CoV-2 by FDA under an Emergency Use Authorization (EUA). This EUA will remain  in effect (meaning this test can be used) for the duration of the COVID-19 declaration under Section 564(b)(1) of the Act, 21 U.S.C.section 360bbb-3(b)(1), unless the authorization is terminated  or revoked sooner.       Influenza A by PCR NEGATIVE NEGATIVE Final   Influenza B by PCR NEGATIVE NEGATIVE Final    Comment: (NOTE) The Xpert Xpress SARS-CoV-2/FLU/RSV plus assay is intended as an aid in the diagnosis of influenza from Nasopharyngeal swab specimens and should not be used as a sole basis for treatment. Nasal washings and aspirates are unacceptable for Xpert Xpress SARS-CoV-2/FLU/RSV testing.  Fact Sheet for Patients: EntrepreneurPulse.com.au  Fact Sheet for Healthcare Providers: IncredibleEmployment.be  This test is not yet approved or cleared by the [place] FDA and has been authorized for detection and/or diagnosis of SARS-CoV-2 by FDA under an Emergency Use Authorization (EUA). This EUA will remain in effect (meaning this test can be used) for the duration of the COVID-19 declaration under Section 564(b)(1) of the Act, 21  U.S.C. section 360bbb-3(b)(1), unless the authorization is terminated or revoked.  Performed at [facility], [address] [place], [place] [address]   Body fluid culture w Gram Stain     Status: None (Preliminary result)   Collection Time: 03/06/21  8:40 AM   Specimen: PATH Cytology Pleural fluid  Result Value Ref Range Status   Specimen Description   Final    PLEURAL Performed at [facility], [address], [place], [place] [address]    Special Requests   Final    NONE Performed at [facility], [address] [place], [place] [address]    Gram Stain   Final    RARE WBC PRESENT, PREDOMINANTLY MONONUCLEAR NO ORGANISMS SEEN Performed at [facility], [place] [address], [place], [place] [address]    Culture PENDING  Incomplete   Report Status PENDING  Incomplete    Coagulation Studies: No results for input(s): LABPROT, INR in the last 72 hours.  Urinalysis: No results for input(s): COLORURINE, LABSPEC, PHURINE, GLUCOSEU, HGBUR, BILIRUBINUR, KETONESUR, PROTEINUR, UROBILINOGEN, NITRITE, LEUKOCYTESUR in the last 72 hours.  Invalid input(s): APPERANCEUR    Imaging: CT CHEST WO CONTRAST  Result Date: 03/06/2021 CLINICAL DATA:  Pneumonia, left pleural effusion and status post left thoracentesis with removal of 750 mL of fluid. Post thoracentesis chest x-ray shows a small amount new air in the pleural space towards the apex. EXAM: CT CHEST  WITHOUT CONTRAST TECHNIQUE: Multidetector CT imaging of the chest was performed following the standard protocol without IV contrast. COMPARISON:  Chest x-ray earlier today. FINDINGS: Cardiovascular: The heart size is within normal limits. No pericardial fluid identified. The thoracic aorta and central pulmonary arteries are normal in caliber. A tunneled dialysis catheter is present inserted via the lower right internal jugular vein and extending to the SVC/RA junction. Mediastinum/Nodes: Multiple scattered  mediastinal lymph nodes are visualized. The largest in the right lower paratracheal region measure approximately 12 mm in maximum short axis diameter. Lymph nodes are most likely reactive given acute pulmonary findings. The thyroid gland appears unremarkable. Lungs/Pleura: There is consolidation of the left lower lobe likely consistent with acute pneumonia. Associated loculated parapneumonic pleural fluid on the left may be consistent with empyema. A small amount of air is present at the apex posteriorly in the pleural space likely introduced at the time of the thoracentesis procedure. This does not represent significant pneumothorax. There is a small right pleural effusion. Potential component of subtle pneumonia at the right lung base. Upper Abdomen: No acute abnormality. Musculoskeletal: No chest wall mass or suspicious bone lesions identified. IMPRESSION: 1. Left lower lobe consolidation consistent with acute pneumonia. Associated loculated parapneumonic pleural fluid may be consistent with developing empyema. A small amount of pleural air at the apex may have been introduced at the time of the thoracentesis and does not represent a significant pneumothorax. 2. Small right pleural effusion with possible component of subtle pneumonia at the right lung base. 3. Multiple scattered small mediastinal lymph nodes which may be reactive given pulmonary findings. Electronically Signed   By: Aletta Edouard M.D.   On: 03/06/2021 10:02   DG Chest Port 1 View  Result Date: 03/06/2021 CLINICAL DATA:  Post thoracentesis on the left. EXAM: PORTABLE CHEST 1 VIEW COMPARISON:  Same day chest radiograph. FINDINGS: No substantial change in size of a left pleural effusion, which is likely loculated. New small area of gas within the superior aspect of the left pleural effusion with air-fluid level. similar small right pleural effusion. Similar left lung opacification. Cardiac silhouette is largely obscured. Similar positioning  of a right IJ dialysis catheter. IMPRESSION: 1. No substantial change in size of a left pleural effusion, which is likely loculated. New small area of gas within the superior aspect of the left pleural effusion with air-fluid level, probably introduced during interval thoracentesis. Recommend follow-up chest radiograph two ensure stability/resolution and exclude developing pneumothorax. 2. Similar left lung opacification, probably compressive atelectasis. Pneumonia is not excluded. 3. Similar small right pleural effusion. Findings discussed with the patient's nurse [place] who will inform the physician. Electronically Signed   By: Margaretha Sheffield M.D.   On: 03/06/2021 09:15   DG Chest Portable 1 View  Result Date: 03/06/2021 CLINICAL DATA:  Chest pain EXAM: PORTABLE CHEST 1 VIEW COMPARISON:  12/01/2020 FINDINGS: Increased opacification of the left hemithorax likely due to large pleural effusion and associated atelectasis. Small right pleural effusion is also present with adjacent atelectasis. Cardiomediastinal contours are obscured. Right dialysis catheter is present. IMPRESSION: Increased opacification of left hemithorax likely due to large pleural effusion and associated atelectasis. There is also a small right pleural effusion with adjacent atelectasis. Electronically Signed   By: Macy Mis M.D.   On: 03/06/2021 07:54   US THORACENTESIS ASP PLEURAL SPACE W/IMG GUIDE  Result Date: 03/06/2021 INDICATION: Patient with shortness of breath and left pleural effusion request received for thoracentesis. EXAM: ULTRASOUND GUIDED LEFT THORACENTESIS MEDICATIONS: Local  1% lidocaine only. COMPLICATIONS: None immediate. PROCEDURE: An ultrasound guided thoracentesis was thoroughly discussed with the patient and questions answered. The benefits, risks, alternatives and complications were also discussed. The patient understands and wishes to proceed with the procedure. Written consent was obtained. Ultrasound was  performed to localize and mark an adequate pocket of fluid in the left chest. The area was then prepped and draped in the normal sterile fashion. 1% Lidocaine was used for local anesthesia. Under ultrasound guidance a 19 gauge, 7-cm, Yueh catheter was introduced. Thoracentesis was performed. The catheter was removed and a dressing applied. Procedure was stopped early secondary to chest pain with remaining small left pleural effusion seen postprocedure. FINDINGS: A total of approximately 750 mL of amber colored fluid was removed. Samples were sent to the laboratory as requested by the clinical team. IMPRESSION: Successful ultrasound guided left thoracentesis yielding 750 mL of pleural fluid. This exam was performed by Tsosie Billing PA-C, and was supervised and interpreted by Dr. Kathlene Cote. Electronically Signed   By: Aletta Edouard M.D.   On: 03/06/2021 09:39     Medications:    sodium chloride     sodium chloride     sodium chloride     ampicillin-sulbactam (UNASYN) IV      sodium chloride   Intravenous Once   amLODipine  10 mg Oral Daily   calcium acetate  1,334 mg Oral TID WC   carvedilol  6.25 mg Oral BID WC   Chlorhexidine Gluconate Cloth  6 each Topical Q0600   losartan  100 mg Oral Daily   minoxidil  5 mg Oral Daily   sodium chloride flush  3 mL Intravenous Q12H   sodium chloride, sodium chloride, sodium chloride, acetaminophen **OR** acetaminophen, alteplase, lidocaine (PF), lidocaine-prilocaine, ondansetron **OR** ondansetron (ZOFRAN) IV, pentafluoroprop-tetrafluoroeth, sodium chloride flush  Assessment/ Plan:  [name] is a 35 y.o.  male with a past medical history significant for hypertension and end-stage renal disease on dialysis.  Patient presents to the emergency department from his outpatient dialysis clinic with complaints of worsening shortness of breath and elevated blood pressure.  He has been admitted for Hypertensive urgency [I16.0]  [facility] /MWF/Rt  Permcath-LUE AVF/ 82kg  End stage renal disease on dialysis: Will maintain outpatient scheduling, if possible.  Plan to dialyze patient today with minimal UF.  Next treatment scheduled for Wednesday.  2. Anemia of chronic kidney disease Lab Results  Component Value Date   HGB 6.9 (L) 03/06/2021  Hemoglobin below target, patient ordered 1 unit packed red blood cells by primary team. Will order EPO with dialysis treatments.  3. Secondary Hyperparathyroidism:   Lab Results  Component Value Date   CALCIUM 8.8 (L) 03/06/2021   PHOS 4.2 12/02/2020    Calcium and phosphorus within acceptable range.  We will continue to monitor bone mineral metabolism during this admission.  4.  Hypertension with chronic renal disease.  Home regimen includes amlodipine, carvedilol, losartan, and minoxidil.  Currently receiving amlodipine these medications along with IV labetalol and a one-time dose of clonidine 0.2 mg.  BP currently 140/105.  5.  Community-acquired pneumonia, seen on chest CT.  Patient received thoracentesis which 750 mL of amber-colored pleural fluid removed.  Patient started on empirical antibiotic therapy, Unasyn azithromycin, ceftriaxone.    LOS: 0   12/12/20222:15 PM

## 2021-03-06 NOTE — ED Notes (Signed)
Attending Agbata notified of pt VS, no new orders at this time, will continue to monitor.

## 2021-03-06 NOTE — Consult Note (Addendum)
Pulmonary Medicine          Date: 03/06/2021,   MRN# [id number] Travis Long 15-Feb-1986     AdmissionWeight: 81.6 kg                 CurrentWeight: 81.6 kg      CHIEF COMPLAINT:   Bilateral pleural effusion   HISTORY OF PRESENT ILLNESS   This is a 68 yr af am male, asked to see for recurrent vs persistent  bilateral effusions. He has ESRD on dialysis. Radiographic data noted below. S/p thoracentesis as recent as 9/22. At present he has had a thoracentesis here, feels less sob.   The does he have empyema or a complicated effusion warranting an acute placement of chest tube. W/u in progress.   PAST MEDICAL HISTORY   Past Medical History:  Diagnosis Date   ESRD on hemodialysis ([place])    Hypertension      SURGICAL HISTORY   Past Surgical History:  Procedure Laterality Date   AV FISTULA PLACEMENT Left      FAMILY HISTORY   Family History  Problem Relation Age of Onset   Hypertension Mother    Diabetes Mellitus II Mother      SOCIAL HISTORY   Social History   Tobacco Use   Smoking status: Former    Types: Cigarettes   Smokeless tobacco: Never  Substance Use Topics   Alcohol use: Not Currently   Drug use: Never     MEDICATIONS    Home Medication:  Current Outpatient Rx   Order #: [id number] Class: Normal   Order #: [id number] Class: Normal   Order #: [id number] Class: Normal   Order #: [id number] Class: Historical Med   Order #: [id number] Class: Normal   Order #: [id number] Class: Normal    Current Medication:  Current Facility-Administered Medications:    0.9 %  sodium chloride infusion (Manually program via Guardrails IV Fluids), , Intravenous, Once, Agbata, Tochukwu, MD   0.9 %  sodium chloride infusion, 250 mL, Intravenous, PRN, Agbata, Tochukwu, MD   acetaminophen (TYLENOL) tablet 650 mg, 650 mg, Oral, Q6H PRN **OR** acetaminophen (TYLENOL) suppository 650 mg, 650 mg, Rectal, Q6H PRN, Agbata, Tochukwu, MD   amLODipine (NORVASC) tablet  10 mg, 10 mg, Oral, Daily, Agbata, Tochukwu, MD, 10 mg at 03/06/21 1021   Ampicillin-Sulbactam (UNASYN) 3 g in sodium chloride 0.9 % 100 mL IVPB, 3 g, Intravenous, Q12H, Agbata, Tochukwu, MD   azithromycin (ZITHROMAX) 500 mg in sodium chloride 0.9 % 250 mL IVPB, 500 mg, Intravenous, Daily, Benita Gutter, RPH, Last Rate: 250 mL/hr at 03/06/21 1229, 500 mg at 03/06/21 1229   calcium acetate (PHOSLO) capsule 1,334 mg, 1,334 mg, Oral, TID WC, Agbata, Tochukwu, MD   carvedilol (COREG) tablet 6.25 mg, 6.25 mg, Oral, BID WC, Agbata, Tochukwu, MD, 6.25 mg at 03/06/21 1021   Chlorhexidine Gluconate Cloth 2 % PADS 6 each, 6 each, Topical, Q0600, Breeze, Shantelle, NP   losartan (COZAAR) tablet 100 mg, 100 mg, Oral, Daily, Agbata, Tochukwu, MD, 100 mg at 03/06/21 1021   minoxidil (LONITEN) tablet 5 mg, 5 mg, Oral, Daily, Agbata, Tochukwu, MD, 5 mg at 03/06/21 1023   ondansetron (ZOFRAN) tablet 4 mg, 4 mg, Oral, Q6H PRN **OR** ondansetron (ZOFRAN) injection 4 mg, 4 mg, Intravenous, Q6H PRN, Agbata, Tochukwu, MD   sodium chloride flush (NS) 0.9 % injection 3 mL, 3 mL, Intravenous, Q12H, Agbata, Tochukwu, MD, 3 mL at 03/06/21 1024   sodium chloride flush (NS) 0.9 % injection 3 mL,  3 mL, Intravenous, PRN, Agbata, Tochukwu, MD  Current Outpatient Medications:    amLODipine (NORVASC) 10 MG tablet, Take 1 tablet (10 mg total) by mouth daily., Disp: 30 tablet, Rfl: 0   calcium acetate (PHOSLO) 667 MG capsule, Take 2 capsules (1,334 mg total) by mouth 3 (three) times daily., Disp: 100 capsule, Rfl: 0   carvedilol (COREG) 6.25 MG tablet, Take 1 tablet (6.25 mg total) by mouth in the morning and at bedtime., Disp: 60 tablet, Rfl: 0   lidocaine-prilocaine (EMLA) cream, Apply 1 application topically as needed (for dialysis). (Patient not taking: Reported on 11/30/2020), Disp: , Rfl:    losartan (COZAAR) 100 MG tablet, Take 1 tablet (100 mg total) by mouth daily., Disp: 60 tablet, Rfl: 0   minoxidil (LONITEN) 2.5 MG  tablet, Take 2 tablets (5 mg total) by mouth daily., Disp: 30 tablet, Rfl: 0    ALLERGIES   Patient has no known allergies.     REVIEW OF SYSTEMS    Review of Systems:  Gen:  Denies  fever, sweats, chills weigh loss  HEENT: Denies blurred vision, double vision, ear pain, eye pain, hearing loss, nose bleeds, sore throat Cardiac:  No dizziness, chest pain or heaviness, chest tightness,edema Resp:   Denies cough or sputum porduction, shortness of breath,wheezing, hemoptysis,  Gi: Denies swallowing difficulty, stomach pain, nausea or vomiting, diarrhea, constipation, bowel incontinence Gu:  Denies bladder incontinence, burning urine Ext:   Denies Joint pain, stiffness or swelling Skin: Denies  skin rash, easy bruising or bleeding or hives Endoc:  Denies polyuria, polydipsia , polyphagia or weight change Psych:   Denies depression, insomnia or hallucinations   Other:  All other systems negative   VS: BP (!) 167/155   Pulse 88   Temp 97.9 F (36.6 C) (Oral)   Resp (!) 25   Ht 5\' 9"  (1.753 m)   Wt 81.6 kg   SpO2 99%   BMI 26.58 kg/m      PHYSICAL EXAM    GENERAL:NAD, no fevers, chills, no weakness no fatigue HEAD: Normocephalic, atraumatic.  EYES: Pupils equal, round, reactive to light. Extraocular muscles intact. No scleral icterus.  MOUTH: Moist mucosal membrane. Dentition intact. No abscess noted.  EAR, NOSE, THROAT: Clear without exudates. No external lesions.  NECK: Supple. No thyromegaly. No nodules. No JVD.  PULMONARY: basal dullness left > right  CARDIOVASCULAR: S1 and S2. Regular rate and rhythm. No murmurs, rubs, or gallops. No edema. Pedal pulses 2+ bilaterally.  GASTROINTESTINAL: Soft, nontender, nondistended. No masses. Positive bowel sounds. No hepatosplenomegaly.  MUSCULOSKELETAL: No swelling, clubbing, or edema. Range of motion full in all extremities.  NEUROLOGIC: Cranial nerves II through XII are intact. No gross focal neurological deficits.  Sensation intact. Reflexes intact.  SKIN: No ulceration, lesions, rashes, or cyanosis. Skin warm and dry. Turgor intact.  PSYCHIATRIC: Mood, affect within normal limits. The patient is awake, alert and oriented x 3. Insight, judgment intact.       IMAGING    CT CHEST WO CONTRAST  Result Date: 03/06/2021 CLINICAL DATA:  Pneumonia, left pleural effusion and status post left thoracentesis with removal of 750 mL of fluid. Post thoracentesis chest x-ray shows a small amount new air in the pleural space towards the apex. EXAM: CT CHEST WITHOUT CONTRAST TECHNIQUE: Multidetector CT imaging of the chest was performed following the standard protocol without IV contrast. COMPARISON:  Chest x-ray earlier today. FINDINGS: Cardiovascular: The heart size is within normal limits. No pericardial fluid identified. The thoracic aorta  and central pulmonary arteries are normal in caliber. A tunneled dialysis catheter is present inserted via the lower right internal jugular vein and extending to the SVC/RA junction. Mediastinum/Nodes: Multiple scattered mediastinal lymph nodes are visualized. The largest in the right lower paratracheal region measure approximately 12 mm in maximum short axis diameter. Lymph nodes are most likely reactive given acute pulmonary findings. The thyroid gland appears unremarkable. Lungs/Pleura: There is consolidation of the left lower lobe likely consistent with acute pneumonia. Associated loculated parapneumonic pleural fluid on the left may be consistent with empyema. A small amount of air is present at the apex posteriorly in the pleural space likely introduced at the time of the thoracentesis procedure. This does not represent significant pneumothorax. There is a small right pleural effusion. Potential component of subtle pneumonia at the right lung base. Upper Abdomen: No acute abnormality. Musculoskeletal: No chest wall mass or suspicious bone lesions identified. IMPRESSION: 1. Left lower  lobe consolidation consistent with acute pneumonia. Associated loculated parapneumonic pleural fluid may be consistent with developing empyema. A small amount of pleural air at the apex may have been introduced at the time of the thoracentesis and does not represent a significant pneumothorax. 2. Small right pleural effusion with possible component of subtle pneumonia at the right lung base. 3. Multiple scattered small mediastinal lymph nodes which may be reactive given pulmonary findings. Electronically Signed   By: Aletta Edouard M.D.   On: 03/06/2021 10:02   DG Chest Port 1 View  Result Date: 03/06/2021 CLINICAL DATA:  Post thoracentesis on the left. EXAM: PORTABLE CHEST 1 VIEW COMPARISON:  Same day chest radiograph. FINDINGS: No substantial change in size of a left pleural effusion, which is likely loculated. New small area of gas within the superior aspect of the left pleural effusion with air-fluid level. similar small right pleural effusion. Similar left lung opacification. Cardiac silhouette is largely obscured. Similar positioning of a right IJ dialysis catheter. IMPRESSION: 1. No substantial change in size of a left pleural effusion, which is likely loculated. New small area of gas within the superior aspect of the left pleural effusion with air-fluid level, probably introduced during interval thoracentesis. Recommend follow-up chest radiograph two ensure stability/resolution and exclude developing pneumothorax. 2. Similar left lung opacification, probably compressive atelectasis. Pneumonia is not excluded. 3. Similar small right pleural effusion. Findings discussed with the patient's nurse [place] who will inform the physician. Electronically Signed   By: Margaretha Sheffield M.D.   On: 03/06/2021 09:15   DG Chest Portable 1 View  Result Date: 03/06/2021 CLINICAL DATA:  Chest pain EXAM: PORTABLE CHEST 1 VIEW COMPARISON:  12/01/2020 FINDINGS: Increased opacification of the left hemithorax likely due  to large pleural effusion and associated atelectasis. Small right pleural effusion is also present with adjacent atelectasis. Cardiomediastinal contours are obscured. Right dialysis catheter is present. IMPRESSION: Increased opacification of left hemithorax likely due to large pleural effusion and associated atelectasis. There is also a small right pleural effusion with adjacent atelectasis. Electronically Signed   By: Macy Mis M.D.   On: 03/06/2021 07:54   US THORACENTESIS ASP PLEURAL SPACE W/IMG GUIDE  Result Date: 03/06/2021 INDICATION: Patient with shortness of breath and left pleural effusion request received for thoracentesis. EXAM: ULTRASOUND GUIDED LEFT THORACENTESIS MEDICATIONS: Local 1% lidocaine only. COMPLICATIONS: None immediate. PROCEDURE: An ultrasound guided thoracentesis was thoroughly discussed with the patient and questions answered. The benefits, risks, alternatives and complications were also discussed. The patient understands and wishes to proceed with the procedure. Written  consent was obtained. Ultrasound was performed to localize and mark an adequate pocket of fluid in the left chest. The area was then prepped and draped in the normal sterile fashion. 1% Lidocaine was used for local anesthesia. Under ultrasound guidance a 19 gauge, 7-cm, Yueh catheter was introduced. Thoracentesis was performed. The catheter was removed and a dressing applied. Procedure was stopped early secondary to chest pain with remaining small left pleural effusion seen postprocedure. FINDINGS: A total of approximately 750 mL of amber colored fluid was removed. Samples were sent to the laboratory as requested by the clinical team. IMPRESSION: Successful ultrasound guided left thoracentesis yielding 750 mL of pleural fluid. This exam was performed by Tsosie Billing PA-C, and was supervised and interpreted by Dr. Kathlene Cote. Electronically Signed   By: Aletta Edouard M.D.   On: 03/06/2021 09:39    Pleural  effusion: predominant lymphycytic, neut 28. Wbc present, glucose 170, ldh , t. Prot 4.5.   Color amber  ASSESSMENT/PLAN   Bilateral effusion. C/w his hx of chf/renal failure and fluid over load. The parameters of the fluid does not point to an empyema. However, he may have a pneumonia with reactive mediastinal nodes. . Agree to treating as such. The chemistries suggest a pseudo exudate vs an exudute (present 9//22). The former may be due to hx of diuresis. Not complicated effusion LDH < 200 But chronically loculated. I doubt  chest tube and tpa will fully resolve the issue. I would recommend speaking with t-surg for possible thorascopic debridement and releasing what look like a trap lung.  - will hold on the chest tube, ans clarify course of action over night - send pleural effusion for gm stain and culture - treat for hcap and anearobic coverage - adust antibitics per above data - continue his home regimen - dilaysis, ( take off extra fluid) - further oders per above     Thank you for allowing me to participate in the care of this patient.   Patient/Family are satisfied with care plan and all questions have been answered.  This document was prepared using Dragon voice recognition software and may include unintentional dictation errors.     Wallene Huh, M.D.  Division of [place]

## 2021-03-06 NOTE — ED Notes (Signed)
BBID [id number] applied to this pt's L wrist below ID band at this time. Blood consent form signed, withnessed, and placed in this pt's chart at this time.

## 2021-03-06 NOTE — Progress Notes (Signed)
pt had Blood transfusion during tx. (See Blood administration flowsheet)  Pt had high BP throughout HD tx but Pt is asymptomatic. High BP was relayed to Dr Candiss Norse and he ordered to give 0.2 clonidine. (See MAR). Hd completed, tolerated well. Pt asymptomatic and has no complaints.

## 2021-03-06 NOTE — Procedures (Addendum)
PROCEDURE SUMMARY:  Successful US guided left thoracentesis. Yielded 750 mL of amber colored fluid. Pt tolerated procedure well. No immediate complications. Procedure was stopped early secondary to chest pain. Small remaining effusion is seen post procedure.  Specimen was sent for labs. CXR ordered.  EBL < 1 mL  Rockney Ghee 03/06/2021 8:56 AM  Post procedure CXR and CT reviewed with Dr. Kathlene Cote today and no concern for PTX, air likely secondary to procedure and CT concerning for pneumonia and possible empyema. Await full CT report. These findings were discussed with the primary physician.

## 2021-03-06 NOTE — H&P (Addendum)
History and Physical    Travis Long SWN:[id number] DOB: 04/22/1985 DOA: 03/06/2021  PCP: Pcp, No   Patient coming from: Home  I have personally briefly reviewed respiratory viral panel is negative old medical records in [place]  Chief Complaint: Shortness of breath  HPI: Travis Long is a 35 y.o. male with medical history significant for end-stage renal disease on hemodialysis (dialysis days are M/W/F) and hypertension who presents to the ER by EMS for evaluation of worsening shortness of breath for 2 weeks. Patient states that he has been compliant to his dialysis sessions but missed one treatment last week.  Shortness of breath was initially with exertion but now because at rest.  He denies having any chest pain or lower extremity swelling.  Patient states that he has been noncompliant with his blood pressure medications because he had been unable to climb the stairs due to shortness of breath. His dry weight is about 82 kg and he denies any significant weight gain. Upon arrival to the ER his blood pressure was 207/129 and he received a dose of IV labetalol. He denies having any headache, no blurred vision, no abdominal pain, no nausea, no vomiting, no dizziness, no lightheadedness, no fever, no chills, no cough, no blurred vision no focal deficit. Sodium 133, potassium 4.7, chloride 96, bicarb 23, glucose 137, BUN 14, creatinine 18, calcium 8.8, alkaline phosphatase 57, albumin 3.8, AST 18, ALT 10, total protein 8.9, BNP 1079, troponin 79, white count 9.5, hemoglobin 6.9 down from 9.5, hematocrit 21.6, MCV 91.5, RDW 14.8, platelet count 213 Respiratory viral panel is negative Chest x-ray reviewed by me shows increased opacification of left hemithorax likely due to large pleural effusion and associated atelectasis.  There is a small right pleural effusion. Twelve-lead EKG reviewed by me shows sinus tachycardia with nonspecific T wave changes.    ED Course: Patient is a  35 year old male who was sent to the ED from the dialysis center for evaluation of worsening shortness of breath. Patient was noted to be hypertensive and admits to not taking his medications for the last 2 days due to the exertional shortness of breath and inability to climb up the stairs. Imaging showed increased opacification of left hemithorax likely due to a large pleural effusion. Patient is status post ultrasound-guided thoracentesis and will be admitted to the hospital for further evaluation.   Review of Systems: As per HPI otherwise all other systems reviewed and negative.    Past Medical History:  Diagnosis Date   ESRD on hemodialysis ([place])    Hypertension     Past Surgical History:  Procedure Laterality Date   AV FISTULA PLACEMENT Left      reports that he has quit smoking. His smoking use included cigarettes. He has never used smokeless tobacco. He reports that he does not currently use alcohol. He reports that he does not use drugs.  No Known Allergies  Family History  Problem Relation Age of Onset   Hypertension Mother    Diabetes Mellitus II Mother       Prior to Admission medications   Medication Sig Start Date End Date Taking? Authorizing Provider  amLODipine (NORVASC) 10 MG tablet Take 1 tablet (10 mg total) by mouth daily. 12/02/20   Sharen Hones, MD  calcium acetate (PHOSLO) 667 MG capsule Take 2 capsules (1,334 mg total) by mouth 3 (three) times daily. 12/02/20   Sharen Hones, MD  carvedilol (COREG) 6.25 MG tablet Take 1 tablet (6.25 mg total) by mouth  in the morning and at bedtime. 12/02/20   Sharen Hones, MD  lidocaine-prilocaine (EMLA) cream Apply 1 application topically as needed (for dialysis). Patient not taking: Reported on 11/30/2020    [provider]  losartan (COZAAR) 100 MG tablet Take 1 tablet (100 mg total) by mouth daily. 12/02/20   Sharen Hones, MD  minoxidil (LONITEN) 2.5 MG tablet Take 2 tablets (5 mg total) by mouth daily. 12/03/20   Sharen Hones, MD    Physical Exam: Vitals:   03/06/21 0800 03/06/21 0822 03/06/21 0849 03/06/21 0956  BP: (!) 190/114 (!) 192/22 (!) 166/111 (!) 201/131  Pulse: 95 91 90 95  Resp: (!) 21   18  Temp:      TempSrc:      SpO2: 96% 95% 97% 92%  Weight:      Height:         Vitals:   03/06/21 0800 03/06/21 0822 03/06/21 0849 03/06/21 0956  BP: (!) 190/114 (!) 192/22 (!) 166/111 (!) 201/131  Pulse: 95 91 90 95  Resp: (!) 21   18  Temp:      TempSrc:      SpO2: 96% 95% 97% 92%  Weight:      Height:          Constitutional: Alert and oriented x 3 .  Appears to be in mild respiratory distress.  Has pursed breathing HEENT:      Head: Normocephalic and atraumatic.         Eyes: PERLA, EOMI, Conjunctivae are normal. Sclera is non-icteric.       Mouth/Throat: Mucous membranes are moist.       Neck: Supple with no signs of meningismus. Cardiovascular: Regular rate and rhythm. No murmurs, gallops, or rubs. 2+ symmetrical distal pulses are present . No JVD. No LE edema Respiratory: Respiratory effort normal .  Decreased breath sounds over left lung zones. No wheezes or rhonchi.  Gastrointestinal: Soft, non tender, and non distended with positive bowel sounds.  Genitourinary: No CVA tenderness. Musculoskeletal: Nontender with normal range of motion in all extremities. No cyanosis, or erythema of extremities. Neurologic:  Face is symmetric. Moving all extremities. No gross focal neurologic deficits . Skin: Skin is warm, dry.  No rash or ulcers Psychiatric: Mood and affect are normal    Labs on Admission: I have personally reviewed following labs and imaging studies  CBC: Recent Labs  Lab 03/06/21 0643  WBC 9.5  NEUTROABS 7.3  HGB 6.9*  HCT 21.6*  MCV 91.5  PLT 833   Basic Metabolic Panel: Recent Labs  Lab 03/06/21 0643  NA 133*  K 4.7  CL 96*  CO2 23  GLUCOSE 137*  BUN 74*  CREATININE 18.63*  CALCIUM 8.8*   GFR: Estimated Creatinine Clearance: 5.6 mL/min (A) (by C-G  formula based on SCr of 18.63 mg/dL (H)). Liver Function Tests: Recent Labs  Lab 03/06/21 0643  AST 18  ALT 10  ALKPHOS 57  BILITOT 0.9  PROT 8.9*  ALBUMIN 3.8   No results for input(s): LIPASE, AMYLASE in the last 168 hours. No results for input(s): AMMONIA in the last 168 hours. Coagulation Profile: No results for input(s): INR, PROTIME in the last 168 hours. Cardiac Enzymes: No results for input(s): CKTOTAL, CKMB, CKMBINDEX, TROPONINI in the last 168 hours. BNP (last 3 results) No results for input(s): PROBNP in the last 8760 hours. HbA1C: No results for input(s): HGBA1C in the last 72 hours. CBG: No results for input(s): GLUCAP in the  last 168 hours. Lipid Profile: No results for input(s): CHOL, HDL, LDLCALC, TRIG, CHOLHDL, LDLDIRECT in the last 72 hours. Thyroid Function Tests: No results for input(s): TSH, T4TOTAL, FREET4, T3FREE, THYROIDAB in the last 72 hours. Anemia Panel: No results for input(s): VITAMINB12, FOLATE, FERRITIN, TIBC, IRON, RETICCTPCT in the last 72 hours. Urine analysis: No results found for: COLORURINE, APPEARANCEUR, LABSPEC, [place], GLUCOSEU, [place], [place], [place], PROTEINUR, UROBILINOGEN, NITRITE, LEUKOCYTESUR  Radiological Exams on Admission: CT CHEST WO CONTRAST  Result Date: 03/06/2021 CLINICAL DATA:  Pneumonia, left pleural effusion and status post left thoracentesis with removal of 750 mL of fluid. Post thoracentesis chest x-ray shows a small amount new air in the pleural space towards the apex. EXAM: CT CHEST WITHOUT CONTRAST TECHNIQUE: Multidetector CT imaging of the chest was performed following the standard protocol without IV contrast. COMPARISON:  Chest x-ray earlier today. FINDINGS: Cardiovascular: The heart size is within normal limits. No pericardial fluid identified. The thoracic aorta and central pulmonary arteries are normal in caliber. A tunneled dialysis catheter is present inserted via the lower right internal jugular vein  and extending to the SVC/RA junction. Mediastinum/Nodes: Multiple scattered mediastinal lymph nodes are visualized. The largest in the right lower paratracheal region measure approximately 12 mm in maximum short axis diameter. Lymph nodes are most likely reactive given acute pulmonary findings. The thyroid gland appears unremarkable. Lungs/Pleura: There is consolidation of the left lower lobe likely consistent with acute pneumonia. Associated loculated parapneumonic pleural fluid on the left may be consistent with empyema. A small amount of air is present at the apex posteriorly in the pleural space likely introduced at the time of the thoracentesis procedure. This does not represent significant pneumothorax. There is a small right pleural effusion. Potential component of subtle pneumonia at the right lung base. Upper Abdomen: No acute abnormality. Musculoskeletal: No chest wall mass or suspicious bone lesions identified. IMPRESSION: 1. Left lower lobe consolidation consistent with acute pneumonia. Associated loculated parapneumonic pleural fluid may be consistent with developing empyema. A small amount of pleural air at the apex may have been introduced at the time of the thoracentesis and does not represent a significant pneumothorax. 2. Small right pleural effusion with possible component of subtle pneumonia at the right lung base. 3. Multiple scattered small mediastinal lymph nodes which may be reactive given pulmonary findings. Electronically Signed   By: Aletta Edouard M.D.   On: 03/06/2021 10:02   DG Chest Port 1 View  Result Date: 03/06/2021 CLINICAL DATA:  Post thoracentesis on the left. EXAM: PORTABLE CHEST 1 VIEW COMPARISON:  Same day chest radiograph. FINDINGS: No substantial change in size of a left pleural effusion, which is likely loculated. New small area of gas within the superior aspect of the left pleural effusion with air-fluid level. similar small right pleural effusion. Similar left lung  opacification. Cardiac silhouette is largely obscured. Similar positioning of a right IJ dialysis catheter. IMPRESSION: 1. No substantial change in size of a left pleural effusion, which is likely loculated. New small area of gas within the superior aspect of the left pleural effusion with air-fluid level, probably introduced during interval thoracentesis. Recommend follow-up chest radiograph two ensure stability/resolution and exclude developing pneumothorax. 2. Similar left lung opacification, probably compressive atelectasis. Pneumonia is not excluded. 3. Similar small right pleural effusion. Findings discussed with the patient's nurse [place] who will inform the physician. Electronically Signed   By: Margaretha Sheffield M.D.   On: 03/06/2021 09:15   DG Chest Portable 1 View  Result  Date: 03/06/2021 CLINICAL DATA:  Chest pain EXAM: PORTABLE CHEST 1 VIEW COMPARISON:  12/01/2020 FINDINGS: Increased opacification of the left hemithorax likely due to large pleural effusion and associated atelectasis. Small right pleural effusion is also present with adjacent atelectasis. Cardiomediastinal contours are obscured. Right dialysis catheter is present. IMPRESSION: Increased opacification of left hemithorax likely due to large pleural effusion and associated atelectasis. There is also a small right pleural effusion with adjacent atelectasis. Electronically Signed   By: Macy Mis M.D.   On: 03/06/2021 07:54   US THORACENTESIS ASP PLEURAL SPACE W/IMG GUIDE  Result Date: 03/06/2021 INDICATION: Patient with shortness of breath and left pleural effusion request received for thoracentesis. EXAM: ULTRASOUND GUIDED LEFT THORACENTESIS MEDICATIONS: Local 1% lidocaine only. COMPLICATIONS: None immediate. PROCEDURE: An ultrasound guided thoracentesis was thoroughly discussed with the patient and questions answered. The benefits, risks, alternatives and complications were also discussed. The patient understands and wishes to  proceed with the procedure. Written consent was obtained. Ultrasound was performed to localize and mark an adequate pocket of fluid in the left chest. The area was then prepped and draped in the normal sterile fashion. 1% Lidocaine was used for local anesthesia. Under ultrasound guidance a 19 gauge, 7-cm, Yueh catheter was introduced. Thoracentesis was performed. The catheter was removed and a dressing applied. Procedure was stopped early secondary to chest pain with remaining small left pleural effusion seen postprocedure. FINDINGS: A total of approximately 750 mL of amber colored fluid was removed. Samples were sent to the laboratory as requested by the clinical team. IMPRESSION: Successful ultrasound guided left thoracentesis yielding 750 mL of pleural fluid. This exam was performed by Tsosie Billing PA-C, and was supervised and interpreted by Dr. Kathlene Cote. Electronically Signed   By: Aletta Edouard M.D.   On: 03/06/2021 09:39     Assessment/Plan Principal Problem:   Acute CHF (congestive heart failure) (HCC) Active Problems:   ESRD on hemodialysis (HCC)   Anemia in ESRD (end-stage renal disease) (HCC)   Tobacco abuse   Pleural effusion on left   Hypertensive urgency   CAP (community acquired pneumonia)     Patient is a 35 year old male who presents to the ER for evaluation of worsening shortness of breath and is found to have significantly elevated blood pressure    Acute CHF Unclear etiology ??  Diastolic dysfunction related to hypertensive heart disease Obtain 2D echocardiogram to assess LVEF Patient does not void and so will not benefit from diuretics Nephrology consult for renal replacement therapy Optimize blood pressure control      Hypertensive urgency Secondary to medication noncompliance Patient received 2 doses of IV labetalol with some improvement in his blood pressure Will resume amlodipine, carvedilol, losartan and minoxidil.     Community-acquired  pneumonia Patient noted to have a left pleural effusion on chest x-ray and is status postthoracentesis with drainage of about 750 cc of pleural fluid CT scan of the chest shows left lower lobe consolidation consistent with acute pneumonia.  Associated loculated parapneumonic pleural fluid may be consistent with developing empyema. Will start patient empirically on antibiotic therapy for presumed community-acquired pneumonia. We will request pulmonology consult.      Anemia and end-stage renal disease Symptomatic as evidenced by worsening shortness of breath Noted to have a hemoglobin of 6.9 g/dl compared to 9.5g/dl 3 months ago Will transfuse 1 unit of packed RBC during dialysis   DVT prophylaxis: SCD  Code Status: full code  Family Communication: Greater than 50% of time was spent discussing  patient's condition and plan of care with him at the bedside.  All questions and concerns have been addressed.  He verbalizes understanding and agrees to plan. Disposition Plan: Back to previous home environment Consults called: Nephrology  Status:At the time of admission, it appears that the appropriate admission status for this patient is inpatient. This is judged to be reasonable and necessary to provide the required intensity of service to ensure the patient's safety given the presenting symptoms, physical exam findings, and initial radiographic and laboratory data in the context of their comorbid conditions. Patient requires inpatient status due to high intensity of service, high risk for further deterioration and high frequency of surveillance required.     Collier Bullock MD [facility]     03/06/2021, 10:38 AM

## 2021-03-06 NOTE — ED Triage Notes (Signed)
Pt arrives via [facility] from [facility] dialysis waiting room.  [facility] called EMS d/t pt [facility].  Pt has dialysis M/W/F and missed last Wednesday.  Per EMS, BP 207/129, HR 110, Sat 96 RA, pt on 5 cardiac meds and has not had them in 3 days d/t them being upstairs and he can not get upstairs d/t [facility], lives at home alone.

## 2021-03-06 NOTE — Consult Note (Signed)
Pharmacy Antibiotic Note  Travis Long is a 35 y.o. male with medical history including ESRD on HD and HTN admitted on 03/06/2021 with pneumonia complicated by parapneumonic pleural effusion / empyema / fluid overload in setting of missed dialysis. IR performed thoracentesis 12/12 and removed 750 mL amber colored fluid. Cytology shows 421 WBC mostly lymphocyte predominant. Light's criteria met for exudative pleural effusion. Pharmacy has been consulted for Unasyn dosing.  Plan:  Unasyn 3 g IV q12h  Height: _0  (175.3 cm) Weight: 81.6 kg (180 lb) IBW/kg (Calculated) : 70.7  Temp (24hrs), Avg:97.9 F (36.6 C), Min:97.9 F (36.6 C), Max:97.9 F (36.6 C)  Recent Labs  Lab 03/06/21 0643  WBC 9.5  CREATININE 18.63*    Estimated Creatinine Clearance: 5.6 mL/min (A) (by C-G formula based on SCr of 18.63 mg/dL (H)).    No Known Allergies  Antimicrobials this admission: Ceftriaxone 12/12 x 1 Azithromycin 12/12 x 1 Unasyn 12/12 >>   Dose adjustments this admission: N/A  Microbiology results: 12/12 Pleural fluid: pending 12/12 MRSA PCR: pending  Thank you for allowing pharmacy to be a part of this patient's care.  Benita Gutter 03/06/2021 12:47 PM

## 2021-03-06 NOTE — ED Provider Notes (Signed)
CXR reviewed, remarkable for large L sided pleural effusion. Suspect recurrent effusion 2/2 ESRD, hypervolemia. Pt also noted to have acute on chronic anemia. Dr. Candiss Norse with Nephrology consulted for HD and likely transfusion with session. WIll c/s IR for thoracentesis as well, admit to medicine.    Duffy Bruce, MD 03/06/21 [phone]

## 2021-03-06 NOTE — ED Provider Notes (Signed)
[facility]  ____________________________________________   Event Date/Time   First MD Initiated Contact with Patient 03/06/21 [phone]     (approximate)  I have reviewed the triage vital signs and the nursing notes.   HISTORY  Chief Complaint Shortness of Breath    HPI Ismar Besecker is a 35 y.o. male past medical history of end-stage renal disease on dialysis Monday Wednesday Friday, anemia, hypertension, history of pleural effusion who presents with shortness of breath.  Patient notes that for the last 2 weeks he has had increasing exertional dyspnea.  He has had significant difficulty getting up the stairs and has not been taking his blood pressure medications because they are in his room which is 17 stairs from the bottom floor.  He denies significant chest pain.  Has had a mild cough no fevers chills.  He was in dialysis today when he was short of breath and they sent him to the emergency department.  He did not get dialysis today.  Patient tells me that he is currently at his dry weight which is about 82 kg.         Past Medical History:  Diagnosis Date   ESRD on hemodialysis [facility])    Hypertension     Patient Active Problem List   Diagnosis Date Noted   Hypertensive emergency 11/30/2020   ESRD on hemodialysis [facility])    Hypertension    Acute pulmonary edema (HCC)    Anemia in ESRD (end-stage renal disease) (HCC)    Tobacco abuse    Bilateral pleural effusion    Acute respiratory failure with hypoxia ([place]) 09/16/2020    Past Surgical History:  Procedure Laterality Date   AV FISTULA PLACEMENT Left     Prior to Admission medications   Medication Sig Start Date End Date Taking? Authorizing Provider  amLODipine (NORVASC) 10 MG tablet Take 1 tablet (10 mg total) by mouth daily. 12/02/20   Sharen Hones, MD  calcium acetate (PHOSLO) 667 MG capsule Take 2 capsules (1,334 mg total) by mouth 3 (three) times daily. 12/02/20   Sharen Hones, MD  carvedilol  (COREG) 6.25 MG tablet Take 1 tablet (6.25 mg total) by mouth in the morning and at bedtime. 12/02/20   Sharen Hones, MD  lidocaine-prilocaine (EMLA) cream Apply 1 application topically as needed (for dialysis). Patient not taking: Reported on 11/30/2020    [provider]  losartan (COZAAR) 100 MG tablet Take 1 tablet (100 mg total) by mouth daily. 12/02/20   Sharen Hones, MD  minoxidil (LONITEN) 2.5 MG tablet Take 2 tablets (5 mg total) by mouth daily. 12/03/20   Sharen Hones, MD    Allergies Patient has no known allergies.  Family History  Problem Relation Age of Onset   Hypertension Mother    Diabetes Mellitus II Mother     Social History Social History   Tobacco Use   Smoking status: Former    Types: Cigarettes   Smokeless tobacco: Never  Substance Use Topics   Alcohol use: Not Currently   Drug use: Never    Review of Systems   Review of Systems  Constitutional:  Negative for chills and fever.  Respiratory:  Positive for shortness of breath. Negative for chest tightness.   Cardiovascular:  Positive for leg swelling. Negative for chest pain.  Gastrointestinal:  Negative for abdominal pain, nausea and vomiting.  All other systems reviewed and are negative.  Physical Exam Updated Vital Signs BP (!) 198/124   Pulse 89  Temp 97.9 F (36.6 C) (Oral)   Resp (!) 21   Ht 5\' 9"  (1.753 m)   Wt 81.6 kg   SpO2 100%   BMI 26.58 kg/m   Physical Exam Vitals and nursing note reviewed.  Constitutional:      General: He is not in acute distress.    Appearance: Normal appearance.  HENT:     Head: Normocephalic and atraumatic.  Eyes:     General: No scleral icterus.    Conjunctiva/sclera: Conjunctivae normal.  Cardiovascular:     Rate and Rhythm: Normal rate and regular rhythm.  Pulmonary:     Effort: Pulmonary effort is normal. No respiratory distress.     Breath sounds: Normal breath sounds. No wheezing.  Abdominal:     Palpations: Abdomen is soft.      Tenderness: There is no abdominal tenderness. There is no guarding.  Musculoskeletal:        General: No deformity or signs of injury.     Cervical back: Normal range of motion.     Right lower leg: No edema.     Left lower leg: No edema.  Skin:    Coloration: Skin is not jaundiced or pale.  Neurological:     General: No focal deficit present.     Mental Status: He is alert and oriented to person, place, and time. Mental status is at baseline.  Psychiatric:        Mood and Affect: Mood normal.        Behavior: Behavior normal.     LABS (all labs ordered are listed, but only abnormal results are displayed)  Labs Reviewed  CBC WITH DIFFERENTIAL/PLATELET - Abnormal; Notable for the following components:      Result Value   RBC 2.36 (*)    Hemoglobin 6.9 (*)    HCT 21.6 (*)    Eosinophils Absolute 0.6 (*)    All other components within normal limits  RESP PANEL BY RT-PCR (FLU A&B, COVID) ARPGX2  BRAIN NATRIURETIC PEPTIDE  COMPREHENSIVE METABOLIC PANEL  TROPONIN I (HIGH SENSITIVITY)   ____________________________________________  EKG  Normal sinus sinus tachycardia, LVH, normal axis, normal intervals, no acute ischemic changes ____________________________________________  RADIOLOGY Almeta Monas, personally viewed and evaluated these images (plain radiographs) as part of my medical decision making, as well as reviewing the written report by the radiologist.  ED MD interpretation:  n/a    ____________________________________________   PROCEDURES  Procedure(s) performed (including Critical Care):  Procedures   ____________________________________________   INITIAL IMPRESSION / [place] / ED COURSE     Patient is a 35 year old male history of end-stage renal disease who presents with progressive exertional dyspnea.  Was at dialysis this morning and was ultimately sent to the emergency department so did not get dialyzed.  Otherwise has not  missed any dialysis sessions.  He has no chest pain or infectious symptoms.  Has not been compliant with his antihypertensives because has had difficulty getting up the stairs.  He is significantly hypertensive on arrival, blood pressure around 220/120 he overall appears well is not distressed does not appear grossly volume overloaded.  Will check chest x-ray and blood work and EKG and give a dose of labetalol now.  I anticipate admission.  His EKG shows LVH but no acute ischemic changes.  At the time of signout he is pending his labs and reassessment.   ____________________________________________   FINAL CLINICAL IMPRESSION(S) / ED DIAGNOSES  Final diagnoses:  Shortness of breath  ED Discharge Orders     None        Note:  This document was prepared using Dragon voice recognition software and may include unintentional dictation errors.    Rada Hay, MD 03/06/21 [phone]

## 2021-03-06 NOTE — ED Notes (Signed)
Pt given warm blanket. X-ray at bedside at this time.

## 2021-03-06 NOTE — Progress Notes (Signed)
BFR decreased  from 400 to 300 then to 250 d/t to arterial pressure bottoming down. Despite BFR at 250 machine keeps alarming so lines were reversed and Arterial pressures improved. BFR maintained at 300

## 2021-03-06 NOTE — Progress Notes (Signed)
Patient refusing telemetry; on-call Provider, A. Cox, MD, notified via secure chat of patient's refusal. Barbaraann Faster, RN 11:24 PM 03/06/2021

## 2021-03-06 NOTE — ED Notes (Signed)
Pt to HD

## 2021-03-07 ENCOUNTER — Inpatient Hospital Stay: Payer: Medicare Other

## 2021-03-07 ENCOUNTER — Inpatient Hospital Stay
Admit: 2021-03-07 | Discharge: 2021-03-07 | Disposition: A | Discharge status: Home / Self Care | Payer: Medicare Other | Attending: Internal Medicine | Admitting: Internal Medicine

## 2021-03-07 DIAGNOSIS — J189 Pneumonia, unspecified organism: Secondary | ICD-10-CM

## 2021-03-07 DIAGNOSIS — J90 Pleural effusion, not elsewhere classified: Secondary | ICD-10-CM

## 2021-03-07 DIAGNOSIS — I160 Hypertensive urgency: Secondary | ICD-10-CM

## 2021-03-07 DIAGNOSIS — R0602 Shortness of breath: Secondary | ICD-10-CM

## 2021-03-07 LAB — TYPE AND SCREEN
ABO/RH(D): O POS
Antibody Screen: NEGATIVE
Unit division: 0

## 2021-03-07 LAB — CYTOLOGY - NON PAP

## 2021-03-07 LAB — HEPATITIS B SURFACE ANTIBODY, QUANTITATIVE: Hep B S AB Quant (Post): 15.7 m[IU]/mL (ref >9.9)

## 2021-03-07 LAB — CBC
HCT: 21.3 % — ABNORMAL LOW (ref 39.0–52.0)
Hemoglobin: 7.2 g/dL — ABNORMAL LOW (ref 13.0–17.0)
MCH: 30.1 pg (ref 26.0–34.0)
MCHC: 33.8 g/dL (ref 30.0–36.0)
MCV: 89.1 fL (ref 80.0–100.0)
Platelets: 222 10*3/uL (ref 150–400)
RBC: 2.39 MIL/uL — ABNORMAL LOW (ref 4.22–5.81)
RDW: 14.7 % (ref 11.5–15.5)
WBC: 8.6 10*3/uL (ref 4.0–10.5)
nRBC: 0 % (ref 0.0–0.2)

## 2021-03-07 LAB — BPAM RBC
Blood Product Expiration Date: 202301022359
ISSUE DATE / TIME: 202212121634
Unit Type and Rh: 5100

## 2021-03-07 LAB — BASIC METABOLIC PANEL
Anion gap: 9 (ref 5–15)
BUN: 47 mg/dL — ABNORMAL HIGH (ref 6–20)
CO2: 28 mmol/L (ref 22–32)
Calcium: 8.4 mg/dL — ABNORMAL LOW (ref 8.9–10.3)
Chloride: 98 mmol/L (ref 98–111)
Creatinine, Ser: 12.44 mg/dL — ABNORMAL HIGH (ref 0.61–1.24)
GFR, Estimated: 5 mL/min — ABNORMAL LOW (ref >60)
Glucose, Bld: 94 mg/dL (ref 70–99)
Potassium: 5.1 mmol/L (ref 3.5–5.1)
Sodium: 135 mmol/L (ref 135–145)

## 2021-03-07 LAB — MRSA NEXT GEN BY PCR, NASAL: MRSA by PCR Next Gen: NOT DETECTED

## 2021-03-07 LAB — HEPATITIS B SURFACE ANTIBODY,QUALITATIVE: Hep B S Ab: REACTIVE — AB

## 2021-03-07 LAB — PROTIME-INR
INR: 1.2 (ref 0.8–1.2)
Prothrombin Time: 14.9 seconds (ref 11.4–15.2)

## 2021-03-07 MED ORDER — LORAZEPAM 2 MG/ML IJ SOLN: 0.5000 mg | Freq: Once | INTRAMUSCULAR | Status: DC | PRN

## 2021-03-07 MED ORDER — CARVEDILOL 12.5 MG PO TABS
12.5000 mg | ORAL_TABLET | Freq: Two times a day (BID) | ORAL | Status: DC
  Administered 2021-03-07 – 2021-03-09 (×4): 12.5 mg via ORAL
  Filled 2021-03-07 (×4): qty 1

## 2021-03-07 MED ORDER — HYDRALAZINE HCL 10 MG PO TABS
10.0000 mg | ORAL_TABLET | Freq: Three times a day (TID) | ORAL | Status: DC
  Administered 2021-03-07 – 2021-03-08 (×5): 10 mg via ORAL
  Filled 2021-03-07 (×9): qty 1

## 2021-03-07 MED ORDER — SODIUM CHLORIDE (PF) 0.9 % IJ SOLN
Freq: Once | INTRAMUSCULAR | Status: DC
  Filled 2021-03-07: qty 10

## 2021-03-07 MED ORDER — HYDROXYZINE HCL 10 MG PO TABS
10.0000 mg | ORAL_TABLET | Freq: Three times a day (TID) | ORAL | Status: DC | PRN
  Filled 2021-03-07: qty 1

## 2021-03-07 MED ORDER — LORAZEPAM 1 MG PO TABS: 0.5000 mg | ORAL_TABLET | Freq: Once | ORAL | Status: DC | PRN

## 2021-03-07 NOTE — Progress Notes (Signed)
[facility]  ROUNDING NOTE   Subjective:   Travis Long is a 35 y.o. male with a past medical history significant for hypertension and end-stage renal disease on dialysis.  Patient presents to the emergency department from his outpatient dialysis clinic with complaints of worsening shortness of breath and elevated blood pressure.  He has been admitted for Shortness of breath [R06.02] Pleural effusion [J90] Pleural effusion, left [J90] Hypertensive urgency [I16.0]  Patient is known to our clinic and receives outpatient dialysis treatments at [facility] supervised by Dr. Holley Raring.    Patient seen sitting at bedside, fully clothed, states he is cold. Patient voices frustration with medical treatment and medical team.  Feels that his concerns were not being heard.  States he is only currently willing to continue dialysis and treatments that remove the fluid from his "back ".  He states that he will rely on God to treat his high blood pressure.   Objective:  Vital signs in last 24 hours:  Temp:  [97.9 F (36.6 C)-99.6 F (37.6 C)] 98.6 F (37 C) (12/13 0805) Pulse Rate:  [85-104] 99 (12/13 0805) Resp:  [13-36] 16 (12/13 0805) BP: (140-198)/(89-155) 192/126 (12/13 0805) SpO2:  [93 %-100 %] 98 % (12/13 0805)  Weight change:  Filed Weights   03/06/21 0636  Weight: 81.6 kg    Intake/Output: I/O last 3 completed shifts: In: 627.5 [P.O.:240; Blood:283; IV Piggyback:104.5] Out: 938 [Other:791]   Intake/Output this shift:  No intake/output data recorded.  Physical Exam: General: NAD, irritable  Head: Normocephalic, atraumatic. Moist oral mucosal membranes  Eyes: Anicteric  Lungs:  Basilar crackles, normal effort, 2 L nasal cannula, cough  Heart: Regular rate and rhythm  Abdomen:  Soft, nontender, nondistended  Extremities: No peripheral edema.  Neurologic: Nonfocal, moving all four extremities  Skin: No lesions  Access: Right chest PermCath, LUE aVF     Basic Metabolic Panel: Recent Labs  Lab 03/06/21 0643 03/07/21 0624  NA 133* 135  K 4.7 5.1  CL 96* 98  CO2 23 28  GLUCOSE 137* 94  BUN 74* 47*  CREATININE 18.63* 12.44*  CALCIUM 8.8* 8.4*     Liver Function Tests: Recent Labs  Lab 03/06/21 0643  AST 18  ALT 10  ALKPHOS 57  BILITOT 0.9  PROT 8.9*  ALBUMIN 3.8    No results for input(s): LIPASE, AMYLASE in the last 168 hours. No results for input(s): AMMONIA in the last 168 hours.  CBC: Recent Labs  Lab 03/06/21 0643 03/07/21 0624  WBC 9.5 8.6  NEUTROABS 7.3  --   HGB 6.9* 7.2*  HCT 21.6* 21.3*  MCV 91.5 89.1  PLT 213 222     Cardiac Enzymes: No results for input(s): CKTOTAL, CKMB, CKMBINDEX, TROPONINI in the last 168 hours.  BNP: Invalid input(s): POCBNP  CBG: No results for input(s): GLUCAP in the last 168 hours.  Microbiology: Results for orders placed or performed during the hospital encounter of 03/06/21  Resp Panel by RT-PCR (Flu A&B, Covid) Nasopharyngeal Swab     Status: None   Collection Time: 03/06/21  6:45 AM   Specimen: Nasopharyngeal Swab; Nasopharyngeal(NP) swabs in vial transport medium  Result Value Ref Range Status   SARS Coronavirus 2 by RT PCR NEGATIVE NEGATIVE Final    Comment: (NOTE) SARS-CoV-2 target nucleic acids are NOT DETECTED.  The SARS-CoV-2 RNA is generally detectable in upper respiratory specimens during the acute phase of infection. The lowest concentration of SARS-CoV-2 viral copies this assay can detect  is 138 copies/mL. A negative result does not preclude SARS-Cov-2 infection and should not be used as the sole basis for treatment or other patient management decisions. A negative result may occur with  improper specimen collection/handling, submission of specimen other than nasopharyngeal swab, presence of viral mutation(s) within the areas targeted by this assay, and inadequate number of viral copies(<138 copies/mL). A negative result must be combined  with clinical observations, patient history, and epidemiological information. The expected result is Negative.  Fact Sheet for Patients:  EntrepreneurPulse.com.au  Fact Sheet for Healthcare Providers:  IncredibleEmployment.be  This test is no t yet approved or cleared by the [place] FDA and  has been authorized for detection and/or diagnosis of SARS-CoV-2 by FDA under an Emergency Use Authorization (EUA). This EUA will remain  in effect (meaning this test can be used) for the duration of the COVID-19 declaration under Section 564(b)(1) of the Act, 21 U.S.C.section 360bbb-3(b)(1), unless the authorization is terminated  or revoked sooner.       Influenza A by PCR NEGATIVE NEGATIVE Final   Influenza B by PCR NEGATIVE NEGATIVE Final    Comment: (NOTE) The Xpert Xpress SARS-CoV-2/FLU/RSV plus assay is intended as an aid in the diagnosis of influenza from Nasopharyngeal swab specimens and should not be used as a sole basis for treatment. Nasal washings and aspirates are unacceptable for Xpert Xpress SARS-CoV-2/FLU/RSV testing.  Fact Sheet for Patients: EntrepreneurPulse.com.au  Fact Sheet for Healthcare Providers: IncredibleEmployment.be  This test is not yet approved or cleared by the [place] FDA and has been authorized for detection and/or diagnosis of SARS-CoV-2 by FDA under an Emergency Use Authorization (EUA). This EUA will remain in effect (meaning this test can be used) for the duration of the COVID-19 declaration under Section 564(b)(1) of the Act, 21 U.S.C. section 360bbb-3(b)(1), unless the authorization is terminated or revoked.  Performed at [facility], [address] [place], [place] [address]   Body fluid culture w Gram Stain     Status: None (Preliminary result)   Collection Time: 03/06/21  8:40 AM   Specimen: PATH Cytology Pleural fluid  Result Value Ref Range  Status   Specimen Description   Final    PLEURAL Performed at [facility], [address] [place], [place] [address]    Special Requests   Final    NONE Performed at [facility], [address] [place], [place] [address]    Gram Stain   Final    RARE WBC PRESENT, PREDOMINANTLY MONONUCLEAR NO ORGANISMS SEEN    Culture   Final    NO GROWTH < 24 HOURS Performed at [facility], [place] [address] [place], [place] [address]    Report Status PENDING  Incomplete  MRSA Next Gen by PCR, Nasal     Status: None   Collection Time: 03/07/21  5:31 AM   Specimen: Nasal Mucosa; Nasal Swab  Result Value Ref Range Status   MRSA by PCR Next Gen NOT DETECTED NOT DETECTED Final    Comment: (NOTE) The GeneXpert MRSA Assay (FDA approved for NASAL specimens only), is one component of a comprehensive MRSA colonization surveillance program. It is not intended to diagnose MRSA infection nor to guide or monitor treatment for MRSA infections. Test performance is not FDA approved in patients less than 50 years old. Performed at [facility], [address] [place], [place] [address]     Coagulation Studies: Recent Labs    03/07/21 5027  LABPROT  14.9  INR 1.2    Urinalysis: No results for input(s): COLORURINE, LABSPEC, PHURINE, GLUCOSEU, HGBUR, BILIRUBINUR, KETONESUR, PROTEINUR, UROBILINOGEN, NITRITE, LEUKOCYTESUR in the last 72 hours.  Invalid input(s): APPERANCEUR    Imaging: CT CHEST WO CONTRAST  Result Date: 03/06/2021 CLINICAL DATA:  Pneumonia, left pleural effusion and status post left thoracentesis with removal of 750 mL of fluid. Post thoracentesis chest x-ray shows a small amount new air in the pleural space towards the apex. EXAM: CT CHEST WITHOUT CONTRAST TECHNIQUE: Multidetector CT imaging of the chest was performed following the standard protocol without IV contrast. COMPARISON:  Chest x-ray earlier today. FINDINGS: Cardiovascular: The heart  size is within normal limits. No pericardial fluid identified. The thoracic aorta and central pulmonary arteries are normal in caliber. A tunneled dialysis catheter is present inserted via the lower right internal jugular vein and extending to the SVC/RA junction. Mediastinum/Nodes: Multiple scattered mediastinal lymph nodes are visualized. The largest in the right lower paratracheal region measure approximately 12 mm in maximum short axis diameter. Lymph nodes are most likely reactive given acute pulmonary findings. The thyroid gland appears unremarkable. Lungs/Pleura: There is consolidation of the left lower lobe likely consistent with acute pneumonia. Associated loculated parapneumonic pleural fluid on the left may be consistent with empyema. A small amount of air is present at the apex posteriorly in the pleural space likely introduced at the time of the thoracentesis procedure. This does not represent significant pneumothorax. There is a small right pleural effusion. Potential component of subtle pneumonia at the right lung base. Upper Abdomen: No acute abnormality. Musculoskeletal: No chest wall mass or suspicious bone lesions identified. IMPRESSION: 1. Left lower lobe consolidation consistent with acute pneumonia. Associated loculated parapneumonic pleural fluid may be consistent with developing empyema. A small amount of pleural air at the apex may have been introduced at the time of the thoracentesis and does not represent a significant pneumothorax. 2. Small right pleural effusion with possible component of subtle pneumonia at the right lung base. 3. Multiple scattered small mediastinal lymph nodes which may be reactive given pulmonary findings. Electronically Signed   By: Aletta Edouard M.D.   On: 03/06/2021 10:02   DG Chest Port 1 View  Result Date: 03/06/2021 CLINICAL DATA:  Post thoracentesis on the left. EXAM: PORTABLE CHEST 1 VIEW COMPARISON:  Same day chest radiograph. FINDINGS: No substantial  change in size of a left pleural effusion, which is likely loculated. New small area of gas within the superior aspect of the left pleural effusion with air-fluid level. similar small right pleural effusion. Similar left lung opacification. Cardiac silhouette is largely obscured. Similar positioning of a right IJ dialysis catheter. IMPRESSION: 1. No substantial change in size of a left pleural effusion, which is likely loculated. New small area of gas within the superior aspect of the left pleural effusion with air-fluid level, probably introduced during interval thoracentesis. Recommend follow-up chest radiograph two ensure stability/resolution and exclude developing pneumothorax. 2. Similar left lung opacification, probably compressive atelectasis. Pneumonia is not excluded. 3. Similar small right pleural effusion. Findings discussed with the patient's nurse [place] who will inform the physician. Electronically Signed   By: Margaretha Sheffield M.D.   On: 03/06/2021 09:15   DG Chest Portable 1 View  Result Date: 03/06/2021 CLINICAL DATA:  Chest pain EXAM: PORTABLE CHEST 1 VIEW COMPARISON:  12/01/2020 FINDINGS: Increased opacification of the left hemithorax likely due to large pleural effusion and associated atelectasis. Small right pleural effusion is also present with adjacent atelectasis.  Cardiomediastinal contours are obscured. Right dialysis catheter is present. IMPRESSION: Increased opacification of left hemithorax likely due to large pleural effusion and associated atelectasis. There is also a small right pleural effusion with adjacent atelectasis. Electronically Signed   By: Macy Mis M.D.   On: 03/06/2021 07:54   US THORACENTESIS ASP PLEURAL SPACE W/IMG GUIDE  Result Date: 03/06/2021 INDICATION: Patient with shortness of breath and left pleural effusion request received for thoracentesis. EXAM: ULTRASOUND GUIDED LEFT THORACENTESIS MEDICATIONS: Local 1% lidocaine only. COMPLICATIONS: None  immediate. PROCEDURE: An ultrasound guided thoracentesis was thoroughly discussed with the patient and questions answered. The benefits, risks, alternatives and complications were also discussed. The patient understands and wishes to proceed with the procedure. Written consent was obtained. Ultrasound was performed to localize and mark an adequate pocket of fluid in the left chest. The area was then prepped and draped in the normal sterile fashion. 1% Lidocaine was used for local anesthesia. Under ultrasound guidance a 19 gauge, 7-cm, Yueh catheter was introduced. Thoracentesis was performed. The catheter was removed and a dressing applied. Procedure was stopped early secondary to chest pain with remaining small left pleural effusion seen postprocedure. FINDINGS: A total of approximately 750 mL of amber colored fluid was removed. Samples were sent to the laboratory as requested by the clinical team. IMPRESSION: Successful ultrasound guided left thoracentesis yielding 750 mL of pleural fluid. This exam was performed by Tsosie Billing PA-C, and was supervised and interpreted by Dr. Kathlene Cote. Electronically Signed   By: Aletta Edouard M.D.   On: 03/06/2021 09:39     Medications:    sodium chloride     sodium chloride     sodium chloride     ampicillin-sulbactam (UNASYN) IV 200 mL/hr at 03/06/21 2337    sodium chloride   Intravenous Once   amLODipine  10 mg Oral Daily   calcium acetate  1,334 mg Oral TID WC   carvedilol  6.25 mg Oral BID WC   Chlorhexidine Gluconate Cloth  6 each Topical Q0600   losartan  100 mg Oral Daily   minoxidil  5 mg Oral Daily   sodium chloride flush  3 mL Intravenous Q12H   sodium chloride, sodium chloride, sodium chloride, acetaminophen **OR** acetaminophen, alteplase, lidocaine (PF), lidocaine-prilocaine, ondansetron **OR** ondansetron (ZOFRAN) IV, pentafluoroprop-tetrafluoroeth, sodium chloride flush  Assessment/ Plan:  [name] is a 35 y.o.  male with a past  medical history significant for hypertension and end-stage renal disease on dialysis.  Patient presents to the emergency department from his outpatient dialysis clinic with complaints of worsening shortness of breath and elevated blood pressure.  He has been admitted for Shortness of breath [R06.02] Pleural effusion [J90] Pleural effusion, left [J90] Hypertensive urgency [I16.0]  [facility]/MWF/Rt Permcath-LUE AVF/ 82kg  End stage renal disease on dialysis: Will maintain outpatient scheduling, if possible.  Received dialysis yesterday, tolerated well.  UF goal 791 mL removed.  Next treatment scheduled for Wednesday.  2. Anemia of chronic kidney disease Lab Results  Component Value Date   HGB 7.2 (L) 03/07/2021  Patient received 1 unit blood transfusion during dialysis yesterday. Will order EPO with dialysis treatments.  3. Secondary Hyperparathyroidism:   Lab Results  Component Value Date   CALCIUM 8.4 (L) 03/07/2021   PHOS 4.2 12/02/2020    We will continue to monitor bone mineral metabolism during this admission.  4.  Hypertension with chronic renal disease.  Home regimen includes amlodipine, carvedilol, losartan, and minoxidil.  Currently receiving amlodipine these  medications along with IV labetalol. Blood pressure remains elevated at 192/126.  5.  Community-acquired pneumonia, seen on chest CT.  Patient received thoracentesis which 750 mL of amber-colored pleural fluid removed on 03/06/21. Empirical antibiotic therapy, Unasyn azithromycin, ceftriaxone.    LOS: 1   12/13/202211:16 AM

## 2021-03-07 NOTE — Progress Notes (Signed)
Patient is very eager to know the plan. He did refuse a.m. meds but did take them later in the day after talking with the [facility]. He refused the ECHO. BP did come down some after taking a.m. meds but is still high. The MD ordered hydralazine but patient refused to take the hydralazine. Patient wants to see the MD. I explained that she is waiting on the pulmonary Dr to do his consult. Patient does not want to see another Dr. He thinks we "are going around the situation" and trying to fix other things that don't need fixing. The only thing he wants addressed is having the fluid drained like he did yesterday during the thoracentesis. I have sent a message to Dr Fletcher Anon to see if he can see this patient today

## 2021-03-07 NOTE — Care Management (Signed)
Amanda Morris dialysis liaison notified of admission.    

## 2021-03-07 NOTE — Progress Notes (Signed)
Patient is very upset about being here. He will not let nursing or MD explain his medical issues. He says, "I came here to fix a flat but you all want to work on my engine". He does not understand that the fluid is in his chest. He thinks the fluid from the thoracentesis done yesterday was pulled from "his back". He is refusing to take any meds including unasyn. Dr Ouida Sills is aware. Patient refusing to wear telemetry. He said that he wants them to "fix what he came here for".

## 2021-03-07 NOTE — Progress Notes (Signed)
PROGRESS NOTE  Travis Long    DOB: 04-19-1985, 35 y.o.  GUR:[id number]  PCP: Pcp, No   Code Status: Full Code   DOA: 03/06/2021   LOS: 1  Brief Narrative of Current Hospitalization  Travis Long is a 36 y.o. male with a PMH significant for HTN, ESRD on HD MWF. They presented from home to the ED on 03/06/2021 with shortness of breath x 14 days. In the ED, it was found that they had not been adherent with blood pressure medications or dialysis as he missed 1 session last week. They were treated with scheduled dialysis and IV blood pressure medication.  Chest x-ray showed left-sided large pleural effusion with atelectasis.  Thoracentesis was performed.  Pulmonology and nephrology were consulted. Patient was admitted to medicine service for further workup and management of shortness of breath as outlined in detail below.  03/07/21 -stable on room air  Assessment & Plan  Principal Problem:   Acute CHF (congestive heart failure) (HCC) Active Problems:   ESRD on hemodialysis ([place])   Anemia in ESRD (end-stage renal disease) (HCC)   Tobacco abuse   Pleural effusion on left   Hypertensive urgency   CAP (community acquired pneumonia)  Shortness of breath  hypertensive emergency- s/p thoracentesis which removed 750 mLs.  Currently on room air.  Patient refuses to describe his current symptoms or status of his breathing -Repeat chest x-ray to evaluate effusion -Pulmonology following, appreciate recommendations -Continue antibiotics -Follow-up thoracentesis labs - echo pending  ESRD-electrolytes stable -Nephrology managing, appreciate your care -RFP on dialysis days  Poorly controlled HTN- -Continue home medications  - increased carvedilol  - adding on hydralazine   DVT prophylaxis: SCDs Start: 03/06/21 0945   Diet:  Diet Orders (From admission, onward)     Start     Ordered   03/07/21 0001  Diet NPO time specified Except for: Sips with Meds  Diet effective midnight        Question:  Except for  Answer:  Travis Long with Meds   03/06/21 1133            Subjective 03/07/21    Pt complains that we are not doing anything for him. States that we have to take more "fluid out of my back". Patient becomes aggressive both verbally and in physical posturing toward me when I state that the fluid was taken from his chest cavity, not his back. He essentially refused to talk with me after that point so I was not able to obtain further history.   Disposition Plan & Communication  Patient status: Inpatient  Admitted From: Home Disposition: Home Anticipated discharge date: TBD  Family Communication: none  Consults, Procedures, Significant Events  Consultants:  IR Pulmonology   Procedures/significant events:  Thoracentesis 12/12  Antimicrobials:  Anti-infectives (From admission, onward)    Start     Dose/Rate Route Frequency Ordered Stop   03/06/21 2200  Ampicillin-Sulbactam (UNASYN) 3 g in sodium chloride 0.9 % 100 mL IVPB        3 g 200 mL/hr over 30 Minutes Intravenous Every 12 hours 03/06/21 1245     03/06/21 1045  cefTRIAXone (ROCEPHIN) 2 g in sodium chloride 0.9 % 100 mL IVPB        2 g 200 mL/hr over 30 Minutes Intravenous Daily 03/06/21 1040 03/06/21 1211   03/06/21 1045  azithromycin (ZITHROMAX) 500 mg in sodium chloride 0.9 % 250 mL IVPB        500 mg 250 mL/hr over 60 Minutes  Intravenous Daily 03/06/21 1040 03/06/21 1330       Objective   Vitals:   03/06/21 1930 03/06/21 2140 03/06/21 2203 03/07/21 0526  BP: (!) 184/100 (!) 169/108 (!) 172/101 (!) 151/108  Pulse:  (!) 101 (!) 101 94  Resp: (!) 30 (!) 24 (!) 24 (!) 24  Temp:  99.6 F (37.6 C)  98.6 F (37 C)  TempSrc:  Oral  Oral  SpO2: 97% 93% 93% 100%  Weight:      Height:        Intake/Output Summary (Last 24 hours) at 03/07/2021 0742 Last data filed at 03/07/2021 0258 Gross per 24 hour  Intake 627.5 ml  Output 791 ml  Net -163.5 ml   Filed Weights   03/06/21 0636  Weight:  81.6 kg    Patient BMI: Body mass index is 26.58 kg/m.   Physical Exam:  General: awake, alert, NAD Respiratory: normal respiratory effort. Nervous: Alert and conversational Extremities: moves all equally Skin: dry, intact, normal temperature, normal color. No rashes, lesions or ulcers on exposed skin Psychiatry: agitated and aggressive  Labs   I have personally reviewed following labs and imaging studies Admission on 03/06/2021  Component Date Value Ref Range Status   B Natriuretic Peptide 03/06/2021 1,079.2 (H)  0.0 - 100.0 pg/mL Final   Sodium 03/06/2021 133 (L)  135 - 145 mmol/L Final   Potassium 03/06/2021 4.7  3.5 - 5.1 mmol/L Final   Chloride 03/06/2021 96 (L)  98 - 111 mmol/L Final   CO2 03/06/2021 23  22 - 32 mmol/L Final   Glucose, Bld 03/06/2021 137 (H)  70 - 99 mg/dL Final   BUN 03/06/2021 74 (H)  6 - 20 mg/dL Final   Creatinine, Ser 03/06/2021 18.63 (H)  0.61 - 1.24 mg/dL Final   Calcium 03/06/2021 8.8 (L)  8.9 - 10.3 mg/dL Final   Total Protein 03/06/2021 8.9 (H)  6.5 - 8.1 g/dL Final   Albumin 03/06/2021 3.8  3.5 - 5.0 g/dL Final   AST 03/06/2021 18  15 - 41 U/L Final   ALT 03/06/2021 10  0 - 44 U/L Final   Alkaline Phosphatase 03/06/2021 57  38 - 126 U/L Final   Total Bilirubin 03/06/2021 0.9  0.3 - 1.2 mg/dL Final   GFR, Estimated 03/06/2021 3 (L)  >60 mL/min Final   Anion gap 03/06/2021 14  5 - 15 Final   Troponin I (High Sensitivity) 03/06/2021 79 (H)  <18 ng/L Final   WBC 03/06/2021 9.5  4.0 - 10.5 K/uL Final   RBC 03/06/2021 2.36 (L)  4.22 - 5.81 MIL/uL Final   Hemoglobin 03/06/2021 6.9 (L)  13.0 - 17.0 g/dL Final   HCT 03/06/2021 21.6 (L)  39.0 - 52.0 % Final   MCV 03/06/2021 91.5  80.0 - 100.0 fL Final   MCH 03/06/2021 29.2  26.0 - 34.0 pg Final   MCHC 03/06/2021 31.9  30.0 - 36.0 g/dL Final   RDW 03/06/2021 14.8  11.5 - 15.5 % Final   Platelets 03/06/2021 213  150 - 400 K/uL Final   nRBC 03/06/2021 0.0  0.0 - 0.2 % Final   Neutrophils Relative %  03/06/2021 79  % Final   Neutro Abs 03/06/2021 7.3  1.7 - 7.7 K/uL Final   Lymphocytes Relative 03/06/2021 9  % Final   Lymphs Abs 03/06/2021 0.9  0.7 - 4.0 K/uL Final   Monocytes Relative 03/06/2021 6  % Final   Monocytes Absolute 03/06/2021 0.6  0.1 -  1.0 K/uL Final   Eosinophils Relative 03/06/2021 6  % Final   Eosinophils Absolute 03/06/2021 0.6 (H)  0.0 - 0.5 K/uL Final   Basophils Relative 03/06/2021 0  % Final   Basophils Absolute 03/06/2021 0.0  0.0 - 0.1 K/uL Final   Immature Granulocytes 03/06/2021 0  % Final   Abs Immature Granulocytes 03/06/2021 0.04  0.00 - 0.07 K/uL Final   SARS Coronavirus 2 by RT PCR 03/06/2021 NEGATIVE  NEGATIVE Final   Influenza A by PCR 03/06/2021 NEGATIVE  NEGATIVE Final   Influenza B by PCR 03/06/2021 NEGATIVE  NEGATIVE Final   ABO/RH(D) 03/06/2021 O POS   Final   Antibody Screen 03/06/2021 NEG   Final   Sample Expiration 03/06/2021 03/09/2021,2359   Final   Unit Number 03/06/2021 [id number]   Final   Blood Component Type 03/06/2021 RBC LR PHER2   Final   Unit division 03/06/2021 00   Final   Status of Unit 03/06/2021 ISSUED   Final   Transfusion Status 03/06/2021 OK TO TRANSFUSE   Final   Crossmatch Result 03/06/2021    Final                   Value:Compatible Performed at [facility], [place], [place] [address]    Troponin I (High Sensitivity) 03/06/2021 97 (H)  <18 ng/L Final   Fluid Type-FCT 03/06/2021 PLEURAL   Corrected   Color, Fluid 03/06/2021 ORANGE (A)  YELLOW Final   Appearance, Fluid 03/06/2021 CLOUDY (A)  CLEAR Final   Total Nucleated Cell Count, Fluid 03/06/2021 421  cu mm Final   Neutrophil Count, Fluid 03/06/2021 20  % Final   Lymphs, Fluid 03/06/2021 64  % Final   Monocyte-Macrophage-Serous Fluid 03/06/2021 11  % Final   Eos, Fluid 03/06/2021 4  % Final   Other Cells, Fluid 03/06/2021 1 BASOPHIL  % Final   Total protein, fluid 03/06/2021 4.5  g/dL Final   Fluid Type-FTP 03/06/2021 PLEURAL    Corrected   Glucose, Fluid 03/06/2021 70  mg/dL Final   Fluid Type-FGLU 03/06/2021 PLEURAL   Corrected   Specimen Description 03/06/2021    Final                   Value:PLEURAL Performed at [facility], [address], [place], [place] [address]    Special Requests 03/06/2021    Final                   Value:NONE Performed at [facility], [address] [place], [place] [address]    Gram Stain 03/06/2021    Final                   Value:RARE WBC PRESENT, PREDOMINANTLY MONONUCLEAR NO ORGANISMS SEEN Performed at [facility], [place] [address] [place], [place] [address]    Culture 03/06/2021 PENDING   Incomplete   Report Status 03/06/2021 PENDING   Incomplete   LD, Fluid 03/06/2021 170 (H)  3 - 23 U/L Final   Fluid Type-FLDH 03/06/2021 PLEURAL   Corrected   Albumin, Fluid 03/06/2021 2.1  g/dL Final   Fluid Type-FALB 03/06/2021 PLEURAL   Corrected   Order Confirmation 03/06/2021    Final                   Value:ORDER PROCESSED BY BLOOD BANK Performed at [facility], [address] [place], [place] [address]    MRSA by  PCR Next Gen 03/07/2021 NOT DETECTED  NOT DETECTED Final   Hepatitis B Surface Ag 03/06/2021 NON REACTIVE  NON REACTIVE Final   Hep B S Ab 03/06/2021 Reactive (A)  NON REACTIVE Final   ABO/RH(D) 03/06/2021    Final                   Value:O POS Performed at [facility], [address] [place], [place] [address]    Frank / TIME 03/06/2021 [id number]   Final   Blood Product Unit Number 03/06/2021 [id number]   Final   PRODUCT CODE 03/06/2021 [id number]   Final   Unit Type and Rh 03/06/2021 5100   Final   Blood Product Expiration Date 03/06/2021 [id number]   Final   WBC 03/07/2021 8.6  4.0 - 10.5 K/uL Final   RBC 03/07/2021 2.39 (L)  4.22 - 5.81 MIL/uL Final   Hemoglobin 03/07/2021 7.2 (L)  13.0 - 17.0 g/dL Final   HCT 03/07/2021 21.3 (L)  39.0 - 52.0 % Final   MCV 03/07/2021 89.1  80.0 - 100.0 fL Final    MCH 03/07/2021 30.1  26.0 - 34.0 pg Final   MCHC 03/07/2021 33.8  30.0 - 36.0 g/dL Final   RDW 03/07/2021 14.7  11.5 - 15.5 % Final   Platelets 03/07/2021 222  150 - 400 K/uL Final   nRBC 03/07/2021 0.0  0.0 - 0.2 % Final   Sodium 03/07/2021 135  135 - 145 mmol/L Final   Potassium 03/07/2021 5.1  3.5 - 5.1 mmol/L Final   Chloride 03/07/2021 98  98 - 111 mmol/L Final   CO2 03/07/2021 28  22 - 32 mmol/L Final   Glucose, Bld 03/07/2021 94  70 - 99 mg/dL Final   BUN 03/07/2021 47 (H)  6 - 20 mg/dL Final   Creatinine, Ser 03/07/2021 12.44 (H)  0.61 - 1.24 mg/dL Final   Calcium 03/07/2021 8.4 (L)  8.9 - 10.3 mg/dL Final   GFR, Estimated 03/07/2021 5 (L)  >60 mL/min Final   Anion gap 03/07/2021 9  5 - 15 Final   Prothrombin Time 03/07/2021 14.9  11.4 - 15.2 seconds Final   INR 03/07/2021 1.2  0.8 - 1.2 Final    Imaging Studies  CT CHEST WO CONTRAST  Result Date: 03/06/2021 CLINICAL DATA:  Pneumonia, left pleural effusion and status post left thoracentesis with removal of 750 mL of fluid. Post thoracentesis chest x-ray shows a small amount new air in the pleural space towards the apex. EXAM: CT CHEST WITHOUT CONTRAST TECHNIQUE: Multidetector CT imaging of the chest was performed following the standard protocol without IV contrast. COMPARISON:  Chest x-ray earlier today. FINDINGS: Cardiovascular: The heart size is within normal limits. No pericardial fluid identified. The thoracic aorta and central pulmonary arteries are normal in caliber. A tunneled dialysis catheter is present inserted via the lower right internal jugular vein and extending to the SVC/RA junction. Mediastinum/Nodes: Multiple scattered mediastinal lymph nodes are visualized. The largest in the right lower paratracheal region measure approximately 12 mm in maximum short axis diameter. Lymph nodes are most likely reactive given acute pulmonary findings. The thyroid gland appears unremarkable. Lungs/Pleura: There is consolidation of the  left lower lobe likely consistent with acute pneumonia. Associated loculated parapneumonic pleural fluid on the left may be consistent with empyema. A small amount of air is present at the apex posteriorly in the pleural space likely introduced at the time of the thoracentesis procedure. This does not represent significant pneumothorax.  There is a small right pleural effusion. Potential component of subtle pneumonia at the right lung base. Upper Abdomen: No acute abnormality. Musculoskeletal: No chest wall mass or suspicious bone lesions identified. IMPRESSION: 1. Left lower lobe consolidation consistent with acute pneumonia. Associated loculated parapneumonic pleural fluid may be consistent with developing empyema. A small amount of pleural air at the apex may have been introduced at the time of the thoracentesis and does not represent a significant pneumothorax. 2. Small right pleural effusion with possible component of subtle pneumonia at the right lung base. 3. Multiple scattered small mediastinal lymph nodes which may be reactive given pulmonary findings. Electronically Signed   By: Aletta Edouard M.D.   On: 03/06/2021 10:02   DG Chest Port 1 View  Result Date: 03/06/2021 CLINICAL DATA:  Post thoracentesis on the left. EXAM: PORTABLE CHEST 1 VIEW COMPARISON:  Same day chest radiograph. FINDINGS: No substantial change in size of a left pleural effusion, which is likely loculated. New small area of gas within the superior aspect of the left pleural effusion with air-fluid level. similar small right pleural effusion. Similar left lung opacification. Cardiac silhouette is largely obscured. Similar positioning of a right IJ dialysis catheter. IMPRESSION: 1. No substantial change in size of a left pleural effusion, which is likely loculated. New small area of gas within the superior aspect of the left pleural effusion with air-fluid level, probably introduced during interval thoracentesis. Recommend follow-up  chest radiograph two ensure stability/resolution and exclude developing pneumothorax. 2. Similar left lung opacification, probably compressive atelectasis. Pneumonia is not excluded. 3. Similar small right pleural effusion. Findings discussed with the patient's nurse [place] who will inform the physician. Electronically Signed   By: Margaretha [place] M.D.   On: 03/06/2021 09:15   DG Chest Portable 1 View  Result Date: 03/06/2021 CLINICAL DATA:  Chest pain EXAM: PORTABLE CHEST 1 VIEW COMPARISON:  12/01/2020 FINDINGS: Increased opacification of the left hemithorax likely due to large pleural effusion and associated atelectasis. Small right pleural effusion is also present with adjacent atelectasis. Cardiomediastinal contours are obscured. Right dialysis catheter is present. IMPRESSION: Increased opacification of left hemithorax likely due to large pleural effusion and associated atelectasis. There is also a small right pleural effusion with adjacent atelectasis. Electronically Signed   By: Macy Mis M.D.   On: 03/06/2021 07:54   US THORACENTESIS ASP PLEURAL SPACE W/IMG GUIDE  Result Date: 03/06/2021 INDICATION: Patient with shortness of breath and left pleural effusion request received for thoracentesis. EXAM: ULTRASOUND GUIDED LEFT THORACENTESIS MEDICATIONS: Local 1% lidocaine only. COMPLICATIONS: None immediate. PROCEDURE: An ultrasound guided thoracentesis was thoroughly discussed with the patient and questions answered. The benefits, risks, alternatives and complications were also discussed. The patient understands and wishes to proceed with the procedure. Written consent was obtained. Ultrasound was performed to localize and mark an adequate pocket of fluid in the left chest. The area was then prepped and draped in the normal sterile fashion. 1% Lidocaine was used for local anesthesia. Under ultrasound guidance a 19 gauge, 7-cm, Yueh catheter was introduced. Thoracentesis was performed. The catheter  was removed and a dressing applied. Procedure was stopped early secondary to chest pain with remaining small left pleural effusion seen postprocedure. FINDINGS: A total of approximately 750 mL of amber colored fluid was removed. Samples were sent to the laboratory as requested by the clinical team. IMPRESSION: Successful ultrasound guided left thoracentesis yielding 750 mL of pleural fluid. This exam was performed by Tsosie Billing PA-C, and was supervised and  interpreted by Dr. Kathlene Cote. Electronically Signed   By: Aletta Edouard M.D.   On: 03/06/2021 09:39    Medications   Scheduled Meds:  sodium chloride   Intravenous Once   amLODipine  10 mg Oral Daily   calcium acetate  1,334 mg Oral TID WC   carvedilol  6.25 mg Oral BID WC   Chlorhexidine Gluconate Cloth  6 each Topical Q0600   losartan  100 mg Oral Daily   minoxidil  5 mg Oral Daily   sodium chloride flush  3 mL Intravenous Q12H   No recently discontinued medications to reconcile  LOS: 1 day   Time spent: >5min  Fumiye Lubben L Maciej Schweitzer, DO [facility] 03/07/2021, 7:42 AM   Available by Epic secure chat 7AM-7PM. If 7PM-7AM, please contact night-coverage Refer to [email or web address] to contact the [facility] Attending or Consulting provider for this pt

## 2021-03-07 NOTE — Progress Notes (Signed)
Order received from Dr Ouida Sills to change diet to renal with fluid restriction.

## 2021-03-07 NOTE — TOC Initial Note (Signed)
Transition of Care [facility]) - Initial/Assessment Note    Patient Details  Name: Travis Long MRN: [id number] Date of Birth: Mar 20, 1986  [facility] CM/SW Contact:    Beverly Sessions, RN Phone Number: 03/07/2021, 3:41 PM  Clinical Narrative:                 Admitted PIR:JJOAC CHF Admitted from:Home PCP: No PCP.  Patient agreeable to new PCP appointment.  Request in [place], not with a Development worker, international aid. New patient appointment made with Dr Shan Levans at [facility].  3/20. This is the earliest appointment I was able to obtain    Patient states that he transports himself from home in [place] to HD in [place]  Expected Discharge Plan: Home/Self Care Barriers to Discharge: Continued Medical Work up   Patient Goals and CMS Choice        Expected Discharge Plan and Services Expected Discharge Plan: Home/Self Care                                              Prior Living Arrangements/Services   Lives with:: Self Patient language and need for interpreter reviewed:: Yes Do you feel safe going back to the place where you live?: Yes            Criminal Activity/Legal Involvement Pertinent to Current Situation/Hospitalization: No - Comment as needed  Activities of Daily Living      Permission Sought/Granted                  Emotional Assessment       Orientation: : Oriented to Self, Oriented to Place, Oriented to  Time, Oriented to Situation Alcohol / Substance Use: Not Applicable Psych Involvement: No (comment)  Admission diagnosis:  Shortness of breath [R06.02] Pleural effusion [J90] Pleural effusion, left [J90] Hypertensive urgency [I16.0] Patient Active Problem List   Diagnosis Date Noted   SOB (shortness of breath)    Hypertensive urgency 03/06/2021   Acute CHF (congestive heart failure) ([place]) 03/06/2021   CAP (community acquired pneumonia) 03/06/2021   Hypertensive emergency 11/30/2020   ESRD on hemodialysis ([place])     Hypertension    Acute pulmonary edema ([place])    Anemia in ESRD (end-stage renal disease) ([place])    Tobacco abuse    Pleural effusion on left    Acute respiratory failure with hypoxia ([place]) 09/16/2020   PCP:  Pcp, No Pharmacy:   [id number] [address] [place], [place] [address] Phone: [phone] Fax: [phone]     Social Determinants of Health (SDOH) Interventions    Readmission Risk Interventions No flowsheet data found.

## 2021-03-07 NOTE — Consult Note (Signed)
[facility] Consultation      Date: 03/07/2021,   MRN# [id number] Travis Long 1986/03/10    CHIEF COMPLAINT:   SOB, abnormal CT Chest   HISTORY OF PRESENT ILLNESS    35 y.o. male with medical history significant for end-stage renal disease on hemodialysis (dialysis days are M/W/F) and hypertension who presents to the ER by EMS for evaluation of worsening shortness of breath for 2 weeks.  Chest x-ray reviewed by me shows increased opacification of left hemithorax likely due to large pleural effusion and associated atelectasis.  01/2020 thoracentesis at [facility] reported as straw colored fluid 11/2020 Thoracentesis evidence of exudative process 12/12 thoracentesis evidence of transudative process  Ct chest reviewed in detail with patient 12 /12/22 Extensive  left sided opacity with effusions loculated with rind of inflammation  Patient states that he has been compliant to his dialysis sessions but missed one treatment last week.    Shortness of breath was initially with exertion but now because at rest.   He denies having any chest pain or lower extremity swelling.   Patient states that he has been noncompliant with his blood pressure medications because he had been unable to climb the stairs due to shortness of breath.  His dry weight is about 82 kg and he denies any significant weight gain. Upon arrival to the ER his blood pressure was 207/129 and he received a dose of IV labetalol.  Respiratory viral panel is negative  No evidence of infection, no fevers, no cough, NL WBC  CBC    Component Value Date/Time   WBC 8.6 03/07/2021 0624   RBC 2.39 (L) 03/07/2021 0624   HGB 7.2 (L) 03/07/2021 0624   HCT 21.3 (L) 03/07/2021 0624   PLT 222 03/07/2021 0624   MCV 89.1 03/07/2021 0624   MCH 30.1 03/07/2021 0624   MCHC 33.8 03/07/2021 0624   RDW 14.7 03/07/2021 0624   LYMPHSABS 0.9 03/06/2021 0643   MONOABS 0.6 03/06/2021 0643   EOSABS 0.6 (H)  03/06/2021 0643   BASOSABS 0.0 03/06/2021 0643     PAST MEDICAL HISTORY   Past Medical History:  Diagnosis Date   ESRD on hemodialysis ([place])    Hypertension      SURGICAL HISTORY   Past Surgical History:  Procedure Laterality Date   AV FISTULA PLACEMENT Left      FAMILY HISTORY   Family History  Problem Relation Age of Onset   Hypertension Mother    Diabetes Mellitus II Mother      SOCIAL HISTORY   Social History   Tobacco Use   Smoking status: Former    Types: Cigarettes   Smokeless tobacco: Never  Substance Use Topics   Alcohol use: Not Currently   Drug use: Never     MEDICATIONS    Home Medication:    Current Medication:  Current Facility-Administered Medications:    0.9 %  sodium chloride infusion (Manually program via Guardrails IV Fluids), , Intravenous, Once, Agbata, Tochukwu, MD   0.9 %  sodium chloride infusion, 250 mL, Intravenous, PRN, Agbata, Tochukwu, MD   0.9 %  sodium chloride infusion, 100 mL, Intravenous, PRN, Breeze, Shantelle, NP   0.9 %  sodium chloride infusion, 100 mL, Intravenous, PRN, Gwyneth Revels, Shantelle, NP   acetaminophen (TYLENOL) tablet 650 mg, 650 mg, Oral, Q6H PRN **OR** acetaminophen (TYLENOL) suppository 650 mg, 650 mg, Rectal, Q6H PRN, Agbata, Tochukwu, MD   alteplase (CATHFLO ACTIVASE) injection 2 mg, 2 mg, Intracatheter, Once PRN, Breeze,  [place], NP   alteplase (tPA) 10mg  in NS 77mL for Dr.Oaks (intrapleural administration/[facility]), , Intrapleural, Once, Flora Lipps, MD   amLODipine (NORVASC) tablet 10 mg, 10 mg, Oral, Daily, Agbata, Tochukwu, MD, 10 mg at 03/07/21 1226   Ampicillin-Sulbactam (UNASYN) 3 g in sodium chloride 0.9 % 100 mL IVPB, 3 g, Intravenous, Q12H, Agbata, Tochukwu, MD, Last Rate: 200 mL/hr at 03/07/21 1318, 3 g at 03/07/21 1318   calcium acetate (PHOSLO) capsule 1,334 mg, 1,334 mg, Oral, TID WC, Agbata, Tochukwu, MD, 1,334 mg at 03/07/21 1224   carvedilol (COREG) tablet 12.5 mg, 12.5 mg, Oral, BID WC,  Richarda Osmond, MD   Chlorhexidine Gluconate Cloth 2 % PADS 6 each, 6 each, Topical, Q0600, Breeze, Shantelle, NP   hydrALAZINE (APRESOLINE) tablet 10 mg, 10 mg, Oral, Q8H, Richarda Osmond, MD   hydrOXYzine (ATARAX) tablet 10 mg, 10 mg, Oral, TID PRN, Richarda Osmond, MD   lidocaine (PF) (XYLOCAINE) 1 % injection 5 mL, 5 mL, Intradermal, PRN, Breeze, Shantelle, NP   lidocaine-prilocaine (EMLA) cream 1 application, 1 application, Topical, PRN, Breeze, Shantelle, NP   LORazepam (ATIVAN) tablet 0.5 mg, 0.5 mg, Oral, Once PRN **OR** LORazepam (ATIVAN) injection 0.5 mg, 0.5 mg, Intramuscular, Once PRN, Richarda Osmond, MD   losartan (COZAAR) tablet 100 mg, 100 mg, Oral, Daily, Agbata, Tochukwu, MD, 100 mg at 03/07/21 1224   minoxidil (LONITEN) tablet 5 mg, 5 mg, Oral, Daily, Agbata, Tochukwu, MD, 5 mg at 03/06/21 1023   ondansetron (ZOFRAN) tablet 4 mg, 4 mg, Oral, Q6H PRN **OR** ondansetron (ZOFRAN) injection 4 mg, 4 mg, Intravenous, Q6H PRN, Agbata, Tochukwu, MD   pentafluoroprop-tetrafluoroeth (GEBAUERS) aerosol 1 application, 1 application, Topical, PRN, Breeze, Shantelle, NP   sodium chloride flush (NS) 0.9 % injection 3 mL, 3 mL, Intravenous, Q12H, Agbata, Tochukwu, MD, 3 mL at 03/07/21 1322   sodium chloride flush (NS) 0.9 % injection 3 mL, 3 mL, Intravenous, PRN, Agbata, Tochukwu, MD    ALLERGIES   Patient has no known allergies.     REVIEW OF SYSTEMS    Review of Systems:  Gen:  Denies  fever, sweats, chills weigh loss  HEENT: Denies blurred vision, double vision, ear pain, eye pain, hearing loss, nose bleeds, sore throat Cardiac:  No dizziness, chest pain or heaviness, chest tightness,edema Resp:   +shortness of breath,+DOE Gi: Denies swallowing difficulty, stomach pain, nausea or vomiting, diarrhea, constipation, bowel incontinence Gu:  Denies bladder incontinence, burning urine Ext:   Denies Joint pain, stiffness or swelling Skin: Denies  skin rash, easy  bruising or bleeding or hives Endoc:  Denies polyuria, polydipsia , polyphagia or weight change Psych:   Denies depression, insomnia or hallucinations   Other:  All other systems negative   VS: BP (!) 178/103 (BP Location: Right Arm)    Pulse 93    Temp 98.5 F (36.9 C)    Resp 16    Ht 5\' 9"  (1.753 m)    Wt 81.6 kg    SpO2 100%    BMI 26.58 kg/m      PHYSICAL EXAM  General Appearance: No distress  EYES PERRLA, EOM intact.   NECK Supple, No JVD Pulmonary: no air movement on left lung CardiovascularNormal S1,S2.  No m/r/g.   Abdomen: Benign, Soft, non-tender. Skin:   warm, no rashes, no ecchymosis  Extremities: normal, no cyanosis, clubbing. Neuro:without focal findings,  speech normal  PSYCHIATRIC: Mood, affect within normal limits.   ALL OTHER ROS ARE NEGATIVE  IMAGING    CT CHEST WO CONTRAST  Result Date: 03/06/2021 CLINICAL DATA:  Pneumonia, left pleural effusion and status post left thoracentesis with removal of 750 mL of fluid. Post thoracentesis chest x-ray shows a small amount new air in the pleural space towards the apex. EXAM: CT CHEST WITHOUT CONTRAST TECHNIQUE: Multidetector CT imaging of the chest was performed following the standard protocol without IV contrast. COMPARISON:  Chest x-ray earlier today. FINDINGS: Cardiovascular: The heart size is within normal limits. No pericardial fluid identified. The thoracic aorta and central pulmonary arteries are normal in caliber. A tunneled dialysis catheter is present inserted via the lower right internal jugular vein and extending to the SVC/RA junction. Mediastinum/Nodes: Multiple scattered mediastinal lymph nodes are visualized. The largest in the right lower paratracheal region measure approximately 12 mm in maximum short axis diameter. Lymph nodes are most likely reactive given acute pulmonary findings. The thyroid gland appears unremarkable. Lungs/Pleura: There is consolidation of the left lower lobe likely  consistent with acute pneumonia. Associated loculated parapneumonic pleural fluid on the left may be consistent with empyema. A small amount of air is present at the apex posteriorly in the pleural space likely introduced at the time of the thoracentesis procedure. This does not represent significant pneumothorax. There is a small right pleural effusion. Potential component of subtle pneumonia at the right lung base. Upper Abdomen: No acute abnormality. Musculoskeletal: No chest wall mass or suspicious bone lesions identified. IMPRESSION: 1. Left lower lobe consolidation consistent with acute pneumonia. Associated loculated parapneumonic pleural fluid may be consistent with developing empyema. A small amount of pleural air at the apex may have been introduced at the time of the thoracentesis and does not represent a significant pneumothorax. 2. Small right pleural effusion with possible component of subtle pneumonia at the right lung base. 3. Multiple scattered small mediastinal lymph nodes which may be reactive given pulmonary findings. Electronically Signed   By: Aletta Edouard M.D.   On: 03/06/2021 10:02   DG Chest Port 1 View  Result Date: 03/07/2021 CLINICAL DATA:  Lucas's/pneumonia EXAM: PORTABLE CHEST 1 VIEW COMPARISON:  Previous studies including the examination of 03/06/2022 FINDINGS: Transverse diameter of heart is increased. Moderate to large left pleural effusion is present. There is small pocket of air in the left apical region with possible slight decrease. There is no evidence of right pneumothorax. Central pulmonary vessels are prominent. There is prominence of interstitial markings in the right parahilar region and right lower lung fields, possibly suggesting interstitial edema. Evaluation of left lung for infiltrates or pulmonary edema is limited due to overlying large left pleural effusion. Tip of dialysis catheter is seen in the right atrium close to its junction with superior vena cava.  IMPRESSION: Moderate to large partly loculated left pleural effusion with no significant interval change. Small right pleural effusion. There are linear densities in the left mid and both lower lung fields suggesting atelectasis/pneumonia. Cardiomegaly. Central pulmonary vessels are prominent suggesting CHF. Electronically Signed   By: Elmer Picker M.D.   On: 03/07/2021 13:42   DG Chest Port 1 View  Result Date: 03/06/2021 CLINICAL DATA:  Post thoracentesis on the left. EXAM: PORTABLE CHEST 1 VIEW COMPARISON:  Same day chest radiograph. FINDINGS: No substantial change in size of a left pleural effusion, which is likely loculated. New small area of gas within the superior aspect of the left pleural effusion with air-fluid level. similar small right pleural effusion. Similar left lung opacification. Cardiac silhouette is largely obscured. Similar  positioning of a right IJ dialysis catheter. IMPRESSION: 1. No substantial change in size of a left pleural effusion, which is likely loculated. New small area of gas within the superior aspect of the left pleural effusion with air-fluid level, probably introduced during interval thoracentesis. Recommend follow-up chest radiograph two ensure stability/resolution and exclude developing pneumothorax. 2. Similar left lung opacification, probably compressive atelectasis. Pneumonia is not excluded. 3. Similar small right pleural effusion. Findings discussed with the patient's nurse Cameron Park who will inform the physician. Electronically Signed   By: Margaretha Sheffield M.D.   On: 03/06/2021 09:15   DG Chest Portable 1 View  Result Date: 03/06/2021 CLINICAL DATA:  Chest pain EXAM: PORTABLE CHEST 1 VIEW COMPARISON:  12/01/2020 FINDINGS: Increased opacification of the left hemithorax likely due to large pleural effusion and associated atelectasis. Small right pleural effusion is also present with adjacent atelectasis. Cardiomediastinal contours are obscured. Right dialysis  catheter is present. IMPRESSION: Increased opacification of left hemithorax likely due to large pleural effusion and associated atelectasis. There is also a small right pleural effusion with adjacent atelectasis. Electronically Signed   By: Macy Mis M.D.   On: 03/06/2021 07:54   US THORACENTESIS ASP PLEURAL SPACE W/IMG GUIDE  Result Date: 03/06/2021 INDICATION: Patient with shortness of breath and left pleural effusion request received for thoracentesis. EXAM: ULTRASOUND GUIDED LEFT THORACENTESIS MEDICATIONS: Local 1% lidocaine only. COMPLICATIONS: None immediate. PROCEDURE: An ultrasound guided thoracentesis was thoroughly discussed with the patient and questions answered. The benefits, risks, alternatives and complications were also discussed. The patient understands and wishes to proceed with the procedure. Written consent was obtained. Ultrasound was performed to localize and mark an adequate pocket of fluid in the left chest. The area was then prepped and draped in the normal sterile fashion. 1% Lidocaine was used for local anesthesia. Under ultrasound guidance a 19 gauge, 7-cm, Yueh catheter was introduced. Thoracentesis was performed. The catheter was removed and a dressing applied. Procedure was stopped early secondary to chest pain with remaining small left pleural effusion seen postprocedure. FINDINGS: A total of approximately 750 mL of amber colored fluid was removed. Samples were sent to the laboratory as requested by the clinical team. IMPRESSION: Successful ultrasound guided left thoracentesis yielding 750 mL of pleural fluid. This exam was performed by Tsosie Billing PA-C, and was supervised and interpreted by Dr. Kathlene Cote. Electronically Signed   By: Aletta Edouard M.D.   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  [facility]  Medical Director [place] Director [place] Department

## 2021-03-08 ENCOUNTER — Encounter: Payer: Self-pay | Admitting: Internal Medicine

## 2021-03-08 ENCOUNTER — Inpatient Hospital Stay: Payer: Medicare Other

## 2021-03-08 DIAGNOSIS — Z992 Dependence on renal dialysis: Secondary | ICD-10-CM

## 2021-03-08 DIAGNOSIS — N186 End stage renal disease: Secondary | ICD-10-CM

## 2021-03-08 DIAGNOSIS — I509 Heart failure, unspecified: Secondary | ICD-10-CM

## 2021-03-08 DIAGNOSIS — Z91199 Patient's noncompliance with other medical treatment and regimen due to unspecified reason: Secondary | ICD-10-CM

## 2021-03-08 DIAGNOSIS — D631 Anemia in chronic kidney disease: Secondary | ICD-10-CM

## 2021-03-08 LAB — RENAL FUNCTION PANEL
Albumin: 3 g/dL — ABNORMAL LOW (ref 3.5–5.0)
Anion gap: 13 (ref 5–15)
BUN: 41 mg/dL — ABNORMAL HIGH (ref 6–20)
CO2: 26 mmol/L (ref 22–32)
Calcium: 8 mg/dL — ABNORMAL LOW (ref 8.9–10.3)
Chloride: 95 mmol/L — ABNORMAL LOW (ref 98–111)
Creatinine, Ser: 9.65 mg/dL — ABNORMAL HIGH (ref 0.61–1.24)
GFR, Estimated: 7 mL/min — ABNORMAL LOW (ref >60)
Glucose, Bld: 86 mg/dL (ref 70–99)
Phosphorus: 3.8 mg/dL (ref 2.5–4.6)
Potassium: 3.8 mmol/L (ref 3.5–5.1)
Sodium: 134 mmol/L — ABNORMAL LOW (ref 135–145)

## 2021-03-08 LAB — CBC
HCT: 21.1 % — ABNORMAL LOW (ref 39.0–52.0)
Hemoglobin: 6.9 g/dL — ABNORMAL LOW (ref 13.0–17.0)
MCH: 29.5 pg (ref 26.0–34.0)
MCHC: 32.7 g/dL (ref 30.0–36.0)
MCV: 90.2 fL (ref 80.0–100.0)
Platelets: 215 10*3/uL (ref 150–400)
RBC: 2.34 MIL/uL — ABNORMAL LOW (ref 4.22–5.81)
RDW: 14.6 % (ref 11.5–15.5)
WBC: 7.2 10*3/uL (ref 4.0–10.5)
nRBC: 0 % (ref 0.0–0.2)

## 2021-03-08 MED ORDER — HYDROCODONE-ACETAMINOPHEN 5-325 MG PO TABS
1.0000 | ORAL_TABLET | ORAL | Status: DC | PRN
  Administered 2021-03-08 – 2021-03-09 (×6): 2 via ORAL
  Filled 2021-03-08 (×6): qty 2

## 2021-03-08 MED ORDER — MIDAZOLAM HCL 2 MG/2ML IJ SOLN
INTRAMUSCULAR | Status: AC | PRN
  Administered 2021-03-08 (×2): 1 mg via INTRAVENOUS

## 2021-03-08 MED ORDER — HEPARIN SODIUM (PORCINE) 1000 UNIT/ML IJ SOLN
3200.0000 [IU] | Freq: Once | INTRAMUSCULAR | Status: AC
Start: 2021-03-08 — End: 2021-03-08

## 2021-03-08 MED ORDER — FENTANYL CITRATE (PF) 100 MCG/2ML IJ SOLN
INTRAMUSCULAR | Status: AC | PRN
  Administered 2021-03-08 (×2): 50 ug via INTRAVENOUS

## 2021-03-08 MED ORDER — SODIUM CHLORIDE (PF) 0.9 % IJ SOLN
Freq: Once | INTRAMUSCULAR | Status: AC
  Filled 2021-03-08: qty 10

## 2021-03-08 MED ORDER — HEPARIN SODIUM (PORCINE) 1000 UNIT/ML IJ SOLN
INTRAMUSCULAR | Status: AC
  Administered 2021-03-08: 3200 [IU]
  Filled 2021-03-08: qty 10

## 2021-03-08 MED ORDER — EPOETIN ALFA 4000 UNIT/ML IJ SOLN
INTRAMUSCULAR | Status: AC
  Filled 2021-03-08: qty 1

## 2021-03-08 MED ORDER — EPOETIN ALFA 10000 UNIT/ML IJ SOLN
4000.0000 [IU] | Freq: Once | INTRAMUSCULAR | Status: AC
  Administered 2021-03-08: 4000 [IU] via INTRAVENOUS

## 2021-03-08 MED ORDER — FENTANYL CITRATE (PF) 100 MCG/2ML IJ SOLN
INTRAMUSCULAR | Status: AC
  Filled 2021-03-08: qty 2

## 2021-03-08 MED ORDER — MIDAZOLAM HCL 2 MG/2ML IJ SOLN
INTRAMUSCULAR | Status: AC
  Filled 2021-03-08: qty 2

## 2021-03-08 NOTE — Assessment & Plan Note (Signed)
Present on admission, due to noncompliance with antihypertensives.  Home regimen includes amlodipine, Coreg, losartan, minoxidil. BP improved with dialysis. -- Continue antihypertensives -- Monitor BP closely

## 2021-03-08 NOTE — Progress Notes (Addendum)
[facility] Consultation     Date: 03/08/2021,   MRN# [id number] Travis Long Oct 25, 1985 Code Status:         CHIEF COMPLAINT:  SOB    SYNOPSIS  35 y.o. male with medical history significant for end-stage renal disease on hemodialysis (dialysis days are M/W/F) and hypertension who presents to the ER by EMS for evaluation of worsening shortness of breath for 2 weeks.   Chest x-ray reviewed by me shows increased opacification of left hemithorax likely due to large pleural effusion and associated atelectasis.   01/2020 thoracentesis at [facility] reported as straw colored fluid 11/2020 Thoracentesis evidence of exudative process 12/12 thoracentesis evidence of transudative process 12/14 PIGTAIL CHEST TUBE PLACED BY CT IR, 850 Cc's removed, BLOODY FLUID 12/14 40 MG TPA given through PIG-TAIL CHEST TUBE, AFTER CLAMPING TUBES   Plan for 2 hrs of patient rolling around in bed, while tubes clamped Instructions provided to bedside nurse to un-clamp after 2 hrs of rolling around in bed.  Patient tolerated procedure well    MEDICATIONS    Home Medication:    Current Medication:   Current Facility-Administered Medications:    0.9 %  sodium chloride infusion (Manually program via Guardrails IV Fluids), , Intravenous, Once, Agbata, Tochukwu, MD   0.9 %  sodium chloride infusion, 250 mL, Intravenous, PRN, Agbata, Tochukwu, MD   acetaminophen (TYLENOL) tablet 650 mg, 650 mg, Oral, Q6H PRN **OR** acetaminophen (TYLENOL) suppository 650 mg, 650 mg, Rectal, Q6H PRN, Agbata, Tochukwu, MD   amLODipine (NORVASC) tablet 10 mg, 10 mg, Oral, Daily, Agbata, Tochukwu, MD, 10 mg at 03/07/21 1226   Ampicillin-Sulbactam (UNASYN) 3 g in sodium chloride 0.9 % 100 mL IVPB, 3 g, Intravenous, Q12H, Agbata, Tochukwu, MD, Stopped at 03/07/21 2208   calcium acetate (PHOSLO) capsule 1,334 mg, 1,334 mg, Oral, TID WC, Agbata, Tochukwu, MD, 1,334 mg at 03/08/21 1753   carvedilol  (COREG) tablet 12.5 mg, 12.5 mg, Oral, BID WC, Doristine Mango L, MD, 12.5 mg at 03/08/21 1752   Chlorhexidine Gluconate Cloth 2 % PADS 6 each, 6 each, Topical, Q0600, Breeze, Shantelle, NP   epoetin alfa (EPOGEN) 4000 UNIT/ML injection, , , ,    fentaNYL (SUBLIMAZE) 100 MCG/2ML injection, , , ,    hydrALAZINE (APRESOLINE) tablet 10 mg, 10 mg, Oral, Q8H, Richarda Osmond, MD, 10 mg at 03/08/21 1752   HYDROcodone-acetaminophen (NORCO/VICODIN) 5-325 MG per tablet 1-2 tablet, 1-2 tablet, Oral, Q4H PRN, Arne Cleveland, MD, 2 tablet at 03/08/21 1816   hydrOXYzine (ATARAX) tablet 10 mg, 10 mg, Oral, TID PRN, Richarda Osmond, MD   LORazepam (ATIVAN) tablet 0.5 mg, 0.5 mg, Oral, Once PRN **OR** LORazepam (ATIVAN) injection 0.5 mg, 0.5 mg, Intramuscular, Once PRN, Richarda Osmond, MD   losartan (COZAAR) tablet 100 mg, 100 mg, Oral, Daily, Agbata, Tochukwu, MD, 100 mg at 03/07/21 1224   minoxidil (LONITEN) tablet 5 mg, 5 mg, Oral, Daily, Agbata, Tochukwu, MD, 5 mg at 03/06/21 1023   ondansetron (ZOFRAN) tablet 4 mg, 4 mg, Oral, Q6H PRN **OR** ondansetron (ZOFRAN) injection 4 mg, 4 mg, Intravenous, Q6H PRN, Agbata, Tochukwu, MD   sodium chloride flush (NS) 0.9 % injection 3 mL, 3 mL, Intravenous, Q12H, Agbata, Tochukwu, MD, 3 mL at 03/07/21 2122   sodium chloride flush (NS) 0.9 % injection 3 mL, 3 mL, Intravenous, PRN, Agbata, Tochukwu, MD     ALLERGIES   Patient has no known allergies.     REVIEW OF SYSTEMS  VS: BP (!) 195/124    Pulse (!) 116    Temp 98.6 F (37 C) (Oral)    Resp (!) 22    Ht 5\' 9"  (1.753 m)    Wt 87.6 kg Comment: bedscale   SpO2 90%    BMI 28.52 kg/m      Review of Systems:  Gen:  Denies  fever, sweats, chills weight loss  HEENT: Denies blurred vision, double vision, ear pain, eye pain, hearing loss, nose bleeds, sore throat Cardiac:  No dizziness, chest pain or heaviness, chest tightness,edema, No JVD Resp:   No cough, -sputum production, +shortness of  breath,-wheezing, -hemoptysis,  Other:  All other systems negative  PHYSICAL EXAM     Physical Examination:   General Appearance: No distress  EYES PERRLA, EOM intact.   NECK Supple, No JVD Pulmonary: normal breath sounds, No wheezing.  Chest tube in place(left side) CardiovascularNormal S1,S2.  No m/r/g.   PSYCHIATRIC: Mood, affect within normal limits.   ALL OTHER ROS ARE NEGATIVE    LABS    Recent Labs    03/06/21 0643 03/07/21 0624 03/08/21 0930  HGB 6.9* 7.2* 6.9*  HCT 21.6* 21.3* 21.1*  MCV 91.5 89.1 90.2  WBC 9.5 8.6 7.2  BUN 74* 47* 41*  CREATININE 18.63* 12.44* 9.65*  GLUCOSE 137* 94 86  CALCIUM 8.8* 8.4* 8.0*  INR  --  1.2  --   ,    No results for input(s): PH in the last 72 hours.  Invalid input(s): PCO2, PO2, BASEEXCESS, BASEDEFICITE, TFT    CULTURE RESULTS   Recent Results (from the past 240 hour(s))  Resp Panel by RT-PCR (Flu A&B, Covid) Nasopharyngeal Swab     Status: None   Collection Time: 03/06/21  6:45 AM   Specimen: Nasopharyngeal Swab; Nasopharyngeal(NP) swabs in vial transport medium  Result Value Ref Range Status   SARS Coronavirus 2 by RT PCR NEGATIVE NEGATIVE Final    Comment: (NOTE) SARS-CoV-2 target nucleic acids are NOT DETECTED.  The SARS-CoV-2 RNA is generally detectable in upper respiratory specimens during the acute phase of infection. The lowest concentration of SARS-CoV-2 viral copies this assay can detect is 138 copies/mL. A negative result does not preclude SARS-Cov-2 infection and should not be used as the sole basis for treatment or other patient management decisions. A negative result may occur with  improper specimen collection/handling, submission of specimen other than nasopharyngeal swab, presence of viral mutation(s) within the areas targeted by this assay, and inadequate number of viral copies(<138 copies/mL). A negative result must be combined with clinical observations, patient history, and  epidemiological information. The expected result is Negative.  Fact Sheet for Patients:  EntrepreneurPulse.com.au  Fact Sheet for Healthcare Providers:  IncredibleEmployment.be  This test is no t yet approved or cleared by the [place] FDA and  has been authorized for detection and/or diagnosis of SARS-CoV-2 by FDA under an Emergency Use Authorization (EUA). This EUA will remain  in effect (meaning this test can be used) for the duration of the COVID-19 declaration under Section 564(b)(1) of the Act, 21 U.S.C.section 360bbb-3(b)(1), unless the authorization is terminated  or revoked sooner.       Influenza A by PCR NEGATIVE NEGATIVE Final   Influenza B by PCR NEGATIVE NEGATIVE Final    Comment: (NOTE) The Xpert Xpress SARS-CoV-2/FLU/RSV plus assay is intended as an aid in the diagnosis of influenza from Nasopharyngeal swab specimens and should not be used as a sole basis for treatment. Nasal washings  and aspirates are unacceptable for Xpert Xpress SARS-CoV-2/FLU/RSV testing.  Fact Sheet for Patients: EntrepreneurPulse.com.au  Fact Sheet for Healthcare Providers: IncredibleEmployment.be  This test is not yet approved or cleared by the [place] FDA and has been authorized for detection and/or diagnosis of SARS-CoV-2 by FDA under an Emergency Use Authorization (EUA). This EUA will remain in effect (meaning this test can be used) for the duration of the COVID-19 declaration under Section 564(b)(1) of the Act, 21 U.S.C. section 360bbb-3(b)(1), unless the authorization is terminated or revoked.  Performed at [facility], [address] [place], [place] [address]   Body fluid culture w Gram Stain     Status: None (Preliminary result)   Collection Time: 03/06/21  8:40 AM   Specimen: PATH Cytology Pleural fluid  Result Value Ref Range Status   Specimen Description   Final     PLEURAL Performed at [facility], [address], [place], [place] [address]    Special Requests   Final    NONE Performed at [facility], [address] [place], [place] [address]    Gram Stain   Final    RARE WBC PRESENT, PREDOMINANTLY MONONUCLEAR NO ORGANISMS SEEN    Culture   Final    NO GROWTH 2 DAYS Performed at [facility], [place] [address], [place], [place] [address]    Report Status PENDING  Incomplete  MRSA Next Gen by PCR, Nasal     Status: None   Collection Time: 03/07/21  5:31 AM   Specimen: Nasal Mucosa; Nasal Swab  Result Value Ref Range Status   MRSA by PCR Next Gen NOT DETECTED NOT DETECTED Final    Comment: (NOTE) The GeneXpert MRSA Assay (FDA approved for NASAL specimens only), is one component of a comprehensive MRSA colonization surveillance program. It is not intended to diagnose MRSA infection nor to guide or monitor treatment for MRSA infections. Test performance is not FDA approved in patients less than 77 years old. Performed at [facility], 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 All questions  answered   Corrin Parker, M.D.  [facility]  Medical Director [place] Director [facility] Cardio-Pulmonary Department

## 2021-03-08 NOTE — Hospital Course (Addendum)
Travis Long is a 35 y.o. male with a PMH significant for HTN, ESRD on HD MWF who presented from home to the ED on 03/06/2021 with shortness of breath x 14 days. In the ED, it was found that they had not been adherent with blood pressure medications or dialysis as he missed 1 session last week. They were treated with scheduled dialysis and IV blood pressure medication.  Chest x-ray showed left-sided large pleural effusion with atelectasis.  Thoracentesis was performed.    Pulmonology and nephrology consulted. Patient was admitted to medicine service for further workup and management of shortness of breath as outlined in detail below.

## 2021-03-08 NOTE — Assessment & Plan Note (Signed)
Status postthoracentesis on 03/06/2021 with 750 cc fluid removed.  Fluid studies consistent with mixed exudative and transudative processes.  Suspect due to ESRD, uncontrolled hypertension and cardiomyopathy. --Pulmonology following. --IR consulted for chest tube placement today. --Plan is for 3 days tPA administration due to loculations. --Continue empiric antibiotics as outlined

## 2021-03-08 NOTE — Progress Notes (Signed)
Patient removed HD dressing and refused to have it replaced stating that he is sleeping and doesn't want to be disturbed.  He also refused blood work.   Will continue to monitor.   Travis Long  03/08/2021  6:44 AM

## 2021-03-08 NOTE — Progress Notes (Signed)
[facility]  ROUNDING NOTE   Subjective:   Travis Long is a 35 y.o. male with a past medical history significant for hypertension and end-stage renal disease on dialysis.  Patient presents to the emergency department from his outpatient dialysis clinic with complaints of worsening shortness of breath and elevated blood pressure.  He has been admitted for Shortness of breath [R06.02] Pleural effusion [J90] Pleural effusion, left [J90] Hypertensive urgency [I16.0]  Patient is known to our clinic and receives outpatient dialysis treatments at [facility], supervised by Dr. Holley Raring.    Patient seen and evaluated during dialysis   HEMODIALYSIS FLOWSHEET:  Blood Flow Rate (mL/min): 300 mL/min Arterial Pressure (mmHg): -130 mmHg Venous Pressure (mmHg): 100 mmHg Transmembrane Pressure (mmHg): 80 mmHg Ultrafiltration Rate (mL/min): 330 mL/min Dialysate Flow Rate (mL/min): 500 ml/min Conductivity: Machine : 13.5 Conductivity: Machine : 13.5 Dialysis Fluid Bolus: Normal Saline Bolus Amount (mL): 250 mL  Feels dialysis isn't making him feel as well as it used to Continues to be agreeable to procedures today to manage pneumonia   Objective:  Vital signs in last 24 hours:  Temp:  [98.4 F (36.9 C)-98.6 F (37 C)] 98.5 F (36.9 C) (12/14 0806) Pulse Rate:  [92-111] 106 (12/14 1138) Resp:  [16-45] 40 (12/14 1138) BP: (143-184)/(84-117) 181/98 (12/14 1138) SpO2:  [96 %-100 %] 100 % (12/14 1138) Weight:  [88.6 kg] 88.6 kg (12/14 0806)  Weight change:  Filed Weights   03/06/21 0636 03/08/21 0806  Weight: 81.6 kg 88.6 kg    Intake/Output: I/O last 3 completed shifts: In: 361.6 [P.O.:240; IV Piggyback:121.6] Out: -    Intake/Output this shift:  Total I/O In: 2.3 [IV Piggyback:2.3] Out: -   Physical Exam: General: NAD, resting quietly  Head: Normocephalic, atraumatic. Moist oral mucosal membranes  Eyes: Anicteric  Lungs:  Basilar crackles and coarse,  normal effort, 2 L nasal cannula, cough  Heart: Regular rate and rhythm  Abdomen:  Soft, nontender, nondistended  Extremities: No peripheral edema.  Neurologic: Nonfocal, moving all four extremities  Skin: No lesions  Access: Right chest PermCath, LUE aVF    Basic Metabolic Panel: Recent Labs  Lab 03/06/21 0643 03/07/21 0624 03/08/21 0930  NA 133* 135 134*  K 4.7 5.1 3.8  CL 96* 98 95*  CO2 23 28 26   GLUCOSE 137* 94 86  BUN 74* 47* 41*  CREATININE 18.63* 12.44* 9.65*  CALCIUM 8.8* 8.4* 8.0*  PHOS  --   --  3.8     Liver Function Tests: Recent Labs  Lab 03/06/21 0643 03/08/21 0930  AST 18  --   ALT 10  --   ALKPHOS 57  --   BILITOT 0.9  --   PROT 8.9*  --   ALBUMIN 3.8 3.0*    No results for input(s): LIPASE, AMYLASE in the last 168 hours. No results for input(s): AMMONIA in the last 168 hours.  CBC: Recent Labs  Lab 03/06/21 0643 03/07/21 0624 03/08/21 0930  WBC 9.5 8.6 7.2  NEUTROABS 7.3  --   --   HGB 6.9* 7.2* 6.9*  HCT 21.6* 21.3* 21.1*  MCV 91.5 89.1 90.2  PLT 213 222 215     Cardiac Enzymes: No results for input(s): CKTOTAL, CKMB, CKMBINDEX, TROPONINI in the last 168 hours.  BNP: Invalid input(s): POCBNP  CBG: No results for input(s): GLUCAP in the last 168 hours.  Microbiology: Results for orders placed or performed during the hospital encounter of 03/06/21  Resp Panel by RT-PCR (Flu  A&B, Covid) Nasopharyngeal Swab     Status: None   Collection Time: 03/06/21  6:45 AM   Specimen: Nasopharyngeal Swab; Nasopharyngeal(NP) swabs in vial transport medium  Result Value Ref Range Status   SARS Coronavirus 2 by RT PCR NEGATIVE NEGATIVE Final    Comment: (NOTE) SARS-CoV-2 target nucleic acids are NOT DETECTED.  The SARS-CoV-2 RNA is generally detectable in upper respiratory specimens during the acute phase of infection. The lowest concentration of SARS-CoV-2 viral copies this assay can detect is 138 copies/mL. A negative result does not  preclude SARS-Cov-2 infection and should not be used as the sole basis for treatment or other patient management decisions. A negative result may occur with  improper specimen collection/handling, submission of specimen other than nasopharyngeal swab, presence of viral mutation(s) within the areas targeted by this assay, and inadequate number of viral copies(<138 copies/mL). A negative result must be combined with clinical observations, patient history, and epidemiological information. The expected result is Negative.  Fact Sheet for Patients:  EntrepreneurPulse.com.au  Fact Sheet for Healthcare Providers:  IncredibleEmployment.be  This test is no t yet approved or cleared by the [place] FDA and  has been authorized for detection and/or diagnosis of SARS-CoV-2 by FDA under an Emergency Use Authorization (EUA). This EUA will remain  in effect (meaning this test can be used) for the duration of the COVID-19 declaration under Section 564(b)(1) of the Act, 21 U.S.C.section 360bbb-3(b)(1), unless the authorization is terminated  or revoked sooner.       Influenza A by PCR NEGATIVE NEGATIVE Final   Influenza B by PCR NEGATIVE NEGATIVE Final    Comment: (NOTE) The Xpert Xpress SARS-CoV-2/FLU/RSV plus assay is intended as an aid in the diagnosis of influenza from Nasopharyngeal swab specimens and should not be used as a sole basis for treatment. Nasal washings and aspirates are unacceptable for Xpert Xpress SARS-CoV-2/FLU/RSV testing.  Fact Sheet for Patients: EntrepreneurPulse.com.au  Fact Sheet for Healthcare Providers: IncredibleEmployment.be  This test is not yet approved or cleared by the [place] FDA and has been authorized for detection and/or diagnosis of SARS-CoV-2 by FDA under an Emergency Use Authorization (EUA). This EUA will remain in effect (meaning this test can be used) for the  duration of the COVID-19 declaration under Section 564(b)(1) of the Act, 21 U.S.C. section 360bbb-3(b)(1), unless the authorization is terminated or revoked.  Performed at [facility], [address] [place], [place] [address]   Body fluid culture w Gram Stain     Status: None (Preliminary result)   Collection Time: 03/06/21  8:40 AM   Specimen: PATH Cytology Pleural fluid  Result Value Ref Range Status   Specimen Description   Final    PLEURAL Performed at [facility], [address], [place], [place] [address]    Special Requests   Final    NONE Performed at [facility], [address] [place], [place] [address]    Gram Stain   Final    RARE WBC PRESENT, PREDOMINANTLY MONONUCLEAR NO ORGANISMS SEEN    Culture   Final    NO GROWTH 2 DAYS Performed at [facility], [place] [address] [place], [place] [address]    Report Status PENDING  Incomplete  MRSA Next Gen by PCR, Nasal     Status: None   Collection Time: 03/07/21  5:31 AM   Specimen: Nasal Mucosa; Nasal Swab  Result Value Ref Range Status   MRSA by PCR Next Gen NOT DETECTED  NOT DETECTED Final    Comment: (NOTE) The GeneXpert MRSA Assay (FDA approved for NASAL specimens only), is one component of a comprehensive MRSA colonization surveillance program. It is not intended to diagnose MRSA infection nor to guide or monitor treatment for MRSA infections. Test performance is not FDA approved in patients less than 9 years old. Performed at [facility], [address] [place], [place] [address]     Coagulation Studies: Recent Labs    03/07/21 0624  LABPROT 14.9  INR 1.2     Urinalysis: No results for input(s): COLORURINE, LABSPEC, PHURINE, GLUCOSEU, HGBUR, BILIRUBINUR, KETONESUR, PROTEINUR, UROBILINOGEN, NITRITE, LEUKOCYTESUR in the last 72 hours.  Invalid input(s): APPERANCEUR    Imaging: DG Chest Port 1 View  Result Date: 03/07/2021 CLINICAL DATA:   Lucas's/pneumonia EXAM: PORTABLE CHEST 1 VIEW COMPARISON:  Previous studies including the examination of 03/06/2022 FINDINGS: Transverse diameter of heart is increased. Moderate to large left pleural effusion is present. There is small pocket of air in the left apical region with possible slight decrease. There is no evidence of right pneumothorax. Central pulmonary vessels are prominent. There is prominence of interstitial markings in the right parahilar region and right lower lung fields, possibly suggesting interstitial edema. Evaluation of left lung for infiltrates or pulmonary edema is limited due to overlying large left pleural effusion. Tip of dialysis catheter is seen in the right atrium close to its junction with superior vena cava. IMPRESSION: Moderate to large partly loculated left pleural effusion with no significant interval change. Small right pleural effusion. There are linear densities in the left mid and both lower lung fields suggesting atelectasis/pneumonia. Cardiomegaly. Central pulmonary vessels are prominent suggesting CHF. Electronically Signed   By: Elmer Picker M.D.   On: 03/07/2021 13:42     Medications:    sodium chloride     sodium chloride     sodium chloride     ampicillin-sulbactam (UNASYN) IV Stopped (03/07/21 2208)    sodium chloride   Intravenous Once   alteplase (tPA) 10mg  in NS 15mL for Dr.Oaks (intrapleural administration/[facility])   Intrapleural Once   amLODipine  10 mg Oral Daily   calcium acetate  1,334 mg Oral TID WC   carvedilol  12.5 mg Oral BID WC   Chlorhexidine Gluconate Cloth  6 each Topical Q0600   epoetin alfa       hydrALAZINE  10 mg Oral Q8H   losartan  100 mg Oral Daily   minoxidil  5 mg Oral Daily   sodium chloride flush  3 mL Intravenous Q12H   sodium chloride, sodium chloride, sodium chloride, acetaminophen **OR** acetaminophen, alteplase, hydrOXYzine, lidocaine (PF), lidocaine-prilocaine, LORazepam **OR** LORazepam, ondansetron **OR**  ondansetron (ZOFRAN) IV, pentafluoroprop-tetrafluoroeth, sodium chloride flush  Assessment/ Plan:  [name] is a 35 y.o.  male with a past medical history significant for hypertension and end-stage renal disease on dialysis.  Patient presents to the emergency department from his outpatient dialysis clinic with complaints of worsening shortness of breath and elevated blood pressure.  He has been admitted for Shortness of breath [R06.02] Pleural effusion [J90] Pleural effusion, left [J90] Hypertensive urgency [I16.0]  [facility]/MWF/Rt Permcath-LUE AVF/ 82kg  End stage renal disease on dialysis: Will maintain outpatient scheduling, if possible.  Receiving dialysis, UF goal 0.5 L as tolerated.  Next treatment scheduled for Friday.  2. Anemia of chronic kidney disease Lab Results  Component Value Date   HGB 6.9 (L) 03/08/2021  Hemoglobin remains decreased to 6.9.  Will  defer blood transfusion to primary team. Will order low-dose EPO 4000 units IV with dialysis treatments.  3. Secondary Hyperparathyroidism:   Lab Results  Component Value Date   CALCIUM 8.0 (L) 03/08/2021   PHOS 3.8 03/08/2021    calcium and phosphorus within acceptable range  4.  Hypertension with chronic renal disease.  Home regimen includes amlodipine, carvedilol, losartan, and minoxidil.  Currently receiving amlodipine these medications along with IV labetalol. Blood pressure improved to 155/94 during dialysis  5.  Community-acquired pneumonia, seen on chest CT.  Patient received thoracentesis which 750 mL of amber-colored pleural fluid removed on 03/06/21. Empirical antibiotic therapy, Unasyn azithromycin, ceftriaxone.  Plans for CT-guided chest tube and tPA for 3 days to treat inflammation and loculations.  Appreciate pulmonary recs    LOS: 2   12/14/202211:47 AM

## 2021-03-08 NOTE — Assessment & Plan Note (Signed)
Hemodialysis MWF. Nephrology following. --Dialysis today

## 2021-03-08 NOTE — Progress Notes (Signed)
Progress Note    Travis Long   NAT:[id number]  DOB: [birth year]  DOA: 03/06/2021     2 Date of Service: 03/08/2021   Brief narrative Travis Long is a 35 y.o. male with a PMH significant for HTN, ESRD on HD MWF who presented from home to the ED on 03/06/2021 with shortness of breath x 14 days. In the ED, it was found that they had not been adherent with blood pressure medications or dialysis as he missed 1 session last week. They were treated with scheduled dialysis and IV blood pressure medication.  Chest x-ray showed left-sided large pleural effusion with atelectasis.  Thoracentesis was performed.  P  ulmonology and nephrology were consulted. Patient was admitted to medicine service for further workup and management of shortness of breath as outlined in detail below.    Assessment and Plan * Acute CHF (congestive heart failure) (HCC) Presented with shortness of breath, pleural effusions on chest x-ray in the setting of noncompliance with antihypertensives resulting in hypertensive urgency. -- Volume removal by dialysis -- Pleural effusion management as below -- Follow-up pending echo -- Strict I/O's and daily weights  CAP (community acquired pneumonia) Continue empiric antibiotics (Unasyn). Pleural effusion management as below per pulmonology.  SOB (shortness of breath) Appears due to volume overload in the setting of renal failure and CHF, pleural effusions. Pneumonia may also be contributing. Management as outlined.  Hypertensive urgency Present on admission, due to noncompliance with antihypertensives.  Home regimen includes amlodipine, Coreg, losartan, minoxidil. BP improved with dialysis. -- Continue antihypertensives -- Monitor BP closely  Pleural effusion on left Status postthoracentesis on 03/06/2021 with 750 cc fluid removed.  Fluid studies consistent with mixed exudative and transudative processes.  Suspect due to ESRD, uncontrolled hypertension and  cardiomyopathy. --Pulmonology following. --IR consulted for chest tube placement today. --Plan is for 3 days tPA administration due to loculations. --Continue empiric antibiotics as outlined   ESRD on hemodialysis ([place]) Hemodialysis MWF. Nephrology following. --Dialysis today  Anemia in ESRD (end-stage renal disease) ([place]) Hemoglobin declined to 6.9 on a.m. labs today. No evidence of bleeding. Suspect some dilutional component, having dialysis today with volume removal. -- Repeat H&H at 3 PM -- We will transfuse 1 unit PRBCs if still less than 7 -- Started on EPO with dialysis per nephrology  Tobacco abuse Counseled extensively on the importance of cessation  Medically noncompliant Presented with hypertensive urgency, not taking antihypertensives.  Intermittently declines recommended and routine care during hospital admission.     Subjective:  Patient seen at bedside after dialysis today.  He denies any shortness of breath, chest pain, cough fevers chills or other complaints.  He is n.p.o. awaiting chest tube placement, reports being hungry and asking if he can have food after the procedure.    Objective Vitals:   03/08/21 1146 03/08/21 1149 03/08/21 1200 03/08/21 1243  BP: (!) 155/94  (!) 163/105   Pulse: (!) 107  (!) 104   Resp: 20  (!) 22 20  Temp: 98.4 F (36.9 C)     TempSrc: Oral     SpO2:   100%   Weight:  87.6 kg    Height:       87.6 kg  Vital signs were reviewed and unremarkable except for: Mildly tachycardic BP improved but still elevated after dialysis   Exam General exam: awake, alert, no acute distress HEENT: moist mucus membranes, hearing grossly normal  Respiratory system: CTAB diminished bases left greater than right, no wheezes, rales or rhonchi,  normal respiratory effort, on room air. Cardiovascular system: normal S1/S2, RRR, no peripheral edema.   Central nervous system: A&O x3. no gross focal neurologic deficits, normal  speech Extremities: moves all, no edema, normal tone Psychiatry: normal mood, flat affect, judgement and insight appear normal    Labs / Other Information My review of labs, imaging, notes and other tests is significant for Hemoglobin down from 7.2-6.9,  Sodium 134    Chest x-ray on 12/13: Moderate to large partly loculated left pleural effusion with no significant interval change. Small right pleural effusion. There are linear densities in the left mid and both lower lung fields suggesting atelectasis/pneumonia. Cardiomegaly. Central pulmonary vessels are prominent suggesting CHF.   Disposition Plan: Status is: Inpatient  Remains inpatient appropriate because: Severity of illness, chest tube plan for placement today with for tPA administration for loculated pleural effusion.        Time spent: 30 minutes [facility] 03/08/2021, 3:06 PM

## 2021-03-08 NOTE — Assessment & Plan Note (Signed)
Counseled extensively on the importance of cessation

## 2021-03-08 NOTE — Assessment & Plan Note (Signed)
Appears due to volume overload in the setting of renal failure and CHF, pleural effusions. Pneumonia may also be contributing. Management as outlined.

## 2021-03-08 NOTE — Progress Notes (Signed)
Patient completed 3hours of dialysis with 1liter net removal.patient refused to have catheter limbs dressed with gauze post treatment.Patient requested oxygen to be set at 4liters due to feeling short of breath, despite saturations at 97-100% on 2liters. Patient resting comfortable, playing on cell phone post dialysis. Epogen started with treatment today.

## 2021-03-08 NOTE — Procedures (Signed)
°  Procedure: CT guided R chest tube placement 41f pigtail to PleurEvac -20cmH2O EBL:   minimal Complications:  none immediate  See full dictation in [facility].  Dillard Cannon MD Main # [phone] Pager  [phone] Mobile [phone]

## 2021-03-08 NOTE — H&P (View-Only) (Signed)
[facility] Consult Note  MRN : [id number]  Travis Long is a 35 y.o. (08/15/1985) male who presents with chief complaint of  Chief Complaint  Patient presents with   Shortness of Breath   History of Present Illness:  Travis Long is a 35 year old male with medical history significant for end-stage renal disease on hemodialysis (dialysis days are M/W/F) and hypertension who presents to the ER by EMS for evaluation of worsening shortness of breath for 2 weeks.  Patient states that he has been compliant to his dialysis sessions but missed one treatment last week.  Shortness of breath was initially with exertion but now because at rest.  He denies having any chest pain or lower extremity swelling.  Patient states that he has been noncompliant with his blood pressure medications because he had been unable to climb the stairs due to shortness of breath.  Chest x-ray reviewed by me shows increased opacification of left hemithorax likely due to large pleural effusion and associated atelectasis.  There is a small right pleural effusion.  Patient has been followed by nephrology.  In the setting of possible high output heart failure related to the patient's left upper extremity dialysis access vascular surgery was consulted by Dr. Mortimer Fries for possible intervention.  Current Facility-Administered Medications  Medication Dose Route Frequency Provider Last Rate Last Admin   0.9 %  sodium chloride infusion (Manually program via Guardrails IV Fluids)   Intravenous Once Agbata, Tochukwu, MD       0.9 %  sodium chloride infusion  250 mL Intravenous PRN Agbata, Tochukwu, MD       acetaminophen (TYLENOL) tablet 650 mg  650 mg Oral Q6H PRN Agbata, Tochukwu, MD       Or   acetaminophen (TYLENOL) suppository 650 mg  650 mg Rectal Q6H PRN Agbata, Tochukwu, MD       alteplase (tPA) 10mg  in NS 44mL for Dr.Oaks (intrapleural administration/[facility])   Intrapleural Once Flora Lipps, MD        amLODipine (NORVASC) tablet 10 mg  10 mg Oral Daily Agbata, Tochukwu, MD   10 mg at 03/07/21 1226   Ampicillin-Sulbactam (UNASYN) 3 g in sodium chloride 0.9 % 100 mL IVPB  3 g Intravenous Q12H Agbata, Tochukwu, MD   Stopped at 03/07/21 2208   calcium acetate (PHOSLO) capsule 1,334 mg  1,334 mg Oral TID WC Agbata, Tochukwu, MD   1,334 mg at 03/07/21 1612   carvedilol (COREG) tablet 12.5 mg  12.5 mg Oral BID WC Doristine Mango L, MD   12.5 mg at 03/07/21 1612   Chlorhexidine Gluconate Cloth 2 % PADS 6 each  6 each Topical Q0600 Colon Flattery, NP       epoetin alfa (EPOGEN) 4000 UNIT/ML injection            hydrALAZINE (APRESOLINE) tablet 10 mg  10 mg Oral Q8H Doristine Mango L, MD   10 mg at 03/08/21 0557   hydrOXYzine (ATARAX) tablet 10 mg  10 mg Oral TID PRN Richarda Osmond, MD       LORazepam (ATIVAN) tablet 0.5 mg  0.5 mg Oral Once PRN Richarda Osmond, MD       Or   LORazepam (ATIVAN) injection 0.5 mg  0.5 mg Intramuscular Once PRN Richarda Osmond, MD       losartan (COZAAR) tablet 100 mg  100 mg Oral Daily Agbata, Tochukwu, MD   100 mg at 03/07/21 1224   minoxidil (LONITEN) tablet  5 mg  5 mg Oral Daily Agbata, Tochukwu, MD   5 mg at 03/06/21 1023   ondansetron (ZOFRAN) tablet 4 mg  4 mg Oral Q6H PRN Agbata, Tochukwu, MD       Or   ondansetron (ZOFRAN) injection 4 mg  4 mg Intravenous Q6H PRN Agbata, Tochukwu, MD       sodium chloride flush (NS) 0.9 % injection 3 mL  3 mL Intravenous Q12H Agbata, Tochukwu, MD   3 mL at 03/07/21 2122   sodium chloride flush (NS) 0.9 % injection 3 mL  3 mL Intravenous PRN Agbata, Tochukwu, MD       Past Medical History:  Diagnosis Date   ESRD on hemodialysis ([place])    Hypertension    Past Surgical History:  Procedure Laterality Date   AV FISTULA PLACEMENT Left    Social History Social History   Tobacco Use   Smoking status: Former    Types: Cigarettes   Smokeless tobacco: Never  Substance Use Topics   Alcohol use: Not  Currently   Drug use: Never   Family History Family History  Problem Relation Age of Onset   Hypertension Mother    Diabetes Mellitus II Mother   Denies family history of peripheral artery disease, venous disease or renal disease.  No Known Allergies  REVIEW OF SYSTEMS (Negative unless checked)  Constitutional: [] Weight loss  [] Fever  [] Chills Cardiac: [] Chest pain   [] Chest pressure   [] Palpitations   [] Shortness of breath when laying flat   [] Shortness of breath at rest   [x] Shortness of breath with exertion. Vascular:  [] Pain in legs with walking   [] Pain in legs at rest   [] Pain in legs when laying flat   [] Claudication   [] Pain in feet when walking  [] Pain in feet at rest  [] Pain in feet when laying flat   [] History of DVT   [] Phlebitis   [] Swelling in legs   [] Varicose veins   [] Non-healing ulcers Pulmonary:   [] Uses home oxygen   [] Productive cough   [] Hemoptysis   [] Wheeze  [] COPD   [] Asthma Neurologic:  [] Dizziness  [] Blackouts   [] Seizures   [] History of stroke   [] History of TIA  [] Aphasia   [] Temporary blindness   [] Dysphagia   [] Weakness or numbness in arms   [] Weakness or numbness in legs Musculoskeletal:  [] Arthritis   [] Joint swelling   [] Joint pain   [] Low back pain Hematologic:  [] Easy bruising  [] Easy bleeding   [] Hypercoagulable state   [x] Anemic  [] Hepatitis Gastrointestinal:  [] Blood in stool   [] Vomiting blood  [] Gastroesophageal reflux/heartburn   [] Difficulty swallowing. Genitourinary:  [x] Chronic kidney disease   [] Difficult urination  [] Frequent urination  [] Burning with urination   [] Blood in urine Skin:  [] Rashes   [] Ulcers   [] Wounds Psychological:  [] History of anxiety   []  History of major depression.  Physical Examination  Vitals:   03/08/21 1146 03/08/21 1149 03/08/21 1200 03/08/21 1243  BP: (!) 155/94  (!) 163/105   Pulse: (!) 107  (!) 104   Resp: 20  (!) 22 20  Temp: 98.4 F (36.9 C)     TempSrc: Oral     SpO2:   100%   Weight:  87.6 kg     Height:       Body mass index is 28.52 kg/m. Gen:  WD/WN, NAD Head: [place]/AT, No temporalis wasting. Prominent temp pulse not noted. Ear/Nose/Throat: Hearing grossly intact, nares w/o erythema or drainage, oropharynx w/o Erythema/Exudate Eyes: Sclera  non-icteric, conjunctiva clear Neck: Trachea midline.  No JVD.  Pulmonary:  Good air movement, respirations not labored, equal bilaterally.  Cardiac: RRR, normal S1, S2. Vascular:  Vessel Right Left  Radial Palpable Palpable  Ulnar Palpable Palpable   Left upper extremity: Extremity is soft.  Extremity is warm.  Fistula with good bruit and thrill.  Palpable radial pulse.  Gastrointestinal: soft, non-tender/non-distended. No guarding/reflex.  Musculoskeletal: M/S 5/5 throughout.  Extremities without ischemic changes.  No deformity or atrophy. No edema. Neurologic: Sensation grossly intact in extremities.  Symmetrical.  Speech is fluent. Motor exam as listed above. Psychiatric: Judgment intact, Mood & affect appropriate for pt's clinical situation. Dermatologic: No rashes or ulcers noted.  No cellulitis or open wounds. Lymph : No Cervical, Axillary, or Inguinal lymphadenopathy.  CBC Lab Results  Component Value Date   WBC 7.2 03/08/2021   HGB 6.9 (L) 03/08/2021   HCT 21.1 (L) 03/08/2021   MCV 90.2 03/08/2021   PLT 215 03/08/2021   BMET    Component Value Date/Time   NA 134 (L) 03/08/2021 0930   K 3.8 03/08/2021 0930   CL 95 (L) 03/08/2021 0930   CO2 26 03/08/2021 0930   GLUCOSE 86 03/08/2021 0930   BUN 41 (H) 03/08/2021 0930   CREATININE 9.65 (H) 03/08/2021 0930   CALCIUM 8.0 (L) 03/08/2021 0930   GFRNONAA 7 (L) 03/08/2021 0930   Estimated Creatinine Clearance: 11.9 mL/min (A) (by C-G formula based on SCr of 9.65 mg/dL (H)).  COAG Lab Results  Component Value Date   INR 1.2 03/07/2021   INR 1.2 12/01/2020   INR 1.1 09/16/2020   Radiology CT CHEST WO CONTRAST  Result Date: 03/06/2021 CLINICAL DATA:  Pneumonia, left  pleural effusion and status post left thoracentesis with removal of 750 mL of fluid. Post thoracentesis chest x-ray shows a small amount new air in the pleural space towards the apex. EXAM: CT CHEST WITHOUT CONTRAST TECHNIQUE: Multidetector CT imaging of the chest was performed following the standard protocol without IV contrast. COMPARISON:  Chest x-ray earlier today. FINDINGS: Cardiovascular: The heart size is within normal limits. No pericardial fluid identified. The thoracic aorta and central pulmonary arteries are normal in caliber. A tunneled dialysis catheter is present inserted via the lower right internal jugular vein and extending to the SVC/RA junction. Mediastinum/Nodes: Multiple scattered mediastinal lymph nodes are visualized. The largest in the right lower paratracheal region measure approximately 12 mm in maximum short axis diameter. Lymph nodes are most likely reactive given acute pulmonary findings. The thyroid gland appears unremarkable. Lungs/Pleura: There is consolidation of the left lower lobe likely consistent with acute pneumonia. Associated loculated parapneumonic pleural fluid on the left may be consistent with empyema. A small amount of air is present at the apex posteriorly in the pleural space likely introduced at the time of the thoracentesis procedure. This does not represent significant pneumothorax. There is a small right pleural effusion. Potential component of subtle pneumonia at the right lung base. Upper Abdomen: No acute abnormality. Musculoskeletal: No chest wall mass or suspicious bone lesions identified. IMPRESSION: 1. Left lower lobe consolidation consistent with acute pneumonia. Associated loculated parapneumonic pleural fluid may be consistent with developing empyema. A small amount of pleural air at the apex may have been introduced at the time of the thoracentesis and does not represent a significant pneumothorax. 2. Small right pleural effusion with possible component  of subtle pneumonia at the right lung base. 3. Multiple scattered small mediastinal lymph nodes which may be  reactive given pulmonary findings. Electronically Signed   By: Aletta Edouard M.D.   On: 03/06/2021 10:02   DG Chest Port 1 View  Result Date: 03/07/2021 CLINICAL DATA:  Lucas's/pneumonia EXAM: PORTABLE CHEST 1 VIEW COMPARISON:  Previous studies including the examination of 03/06/2022 FINDINGS: Transverse diameter of heart is increased. Moderate to large left pleural effusion is present. There is small pocket of air in the left apical region with possible slight decrease. There is no evidence of right pneumothorax. Central pulmonary vessels are prominent. There is prominence of interstitial markings in the right parahilar region and right lower lung fields, possibly suggesting interstitial edema. Evaluation of left lung for infiltrates or pulmonary edema is limited due to overlying large left pleural effusion. Tip of dialysis catheter is seen in the right atrium close to its junction with superior vena cava. IMPRESSION: Moderate to large partly loculated left pleural effusion with no significant interval change. Small right pleural effusion. There are linear densities in the left mid and both lower lung fields suggesting atelectasis/pneumonia. Cardiomegaly. Central pulmonary vessels are prominent suggesting CHF. Electronically Signed   By: Elmer Picker M.D.   On: 03/07/2021 13:42   DG Chest Port 1 View  Result Date: 03/06/2021 CLINICAL DATA:  Post thoracentesis on the left. EXAM: PORTABLE CHEST 1 VIEW COMPARISON:  Same day chest radiograph. FINDINGS: No substantial change in size of a left pleural effusion, which is likely loculated. New small area of gas within the superior aspect of the left pleural effusion with air-fluid level. similar small right pleural effusion. Similar left lung opacification. Cardiac silhouette is largely obscured. Similar positioning of a right IJ dialysis  catheter. IMPRESSION: 1. No substantial change in size of a left pleural effusion, which is likely loculated. New small area of gas within the superior aspect of the left pleural effusion with air-fluid level, probably introduced during interval thoracentesis. Recommend follow-up chest radiograph two ensure stability/resolution and exclude developing pneumothorax. 2. Similar left lung opacification, probably compressive atelectasis. Pneumonia is not excluded. 3. Similar small right pleural effusion. Findings discussed with the patient's nurse [place] who will inform the physician. Electronically Signed   By: Margaretha Sheffield M.D.   On: 03/06/2021 09:15   DG Chest Portable 1 View  Result Date: 03/06/2021 CLINICAL DATA:  Chest pain EXAM: PORTABLE CHEST 1 VIEW COMPARISON:  12/01/2020 FINDINGS: Increased opacification of the left hemithorax likely due to large pleural effusion and associated atelectasis. Small right pleural effusion is also present with adjacent atelectasis. Cardiomediastinal contours are obscured. Right dialysis catheter is present. IMPRESSION: Increased opacification of left hemithorax likely due to large pleural effusion and associated atelectasis. There is also a small right pleural effusion with adjacent atelectasis. Electronically Signed   By: Macy Mis M.D.   On: 03/06/2021 07:54   US THORACENTESIS ASP PLEURAL SPACE W/IMG GUIDE  Result Date: 03/06/2021 INDICATION: Patient with shortness of breath and left pleural effusion request received for thoracentesis. EXAM: ULTRASOUND GUIDED LEFT THORACENTESIS MEDICATIONS: Local 1% lidocaine only. COMPLICATIONS: None immediate. PROCEDURE: An ultrasound guided thoracentesis was thoroughly discussed with the patient and questions answered. The benefits, risks, alternatives and complications were also discussed. The patient understands and wishes to proceed with the procedure. Written consent was obtained. Ultrasound was performed to localize and  mark an adequate pocket of fluid in the left chest. The area was then prepped and draped in the normal sterile fashion. 1% Lidocaine was used for local anesthesia. Under ultrasound guidance a 19 gauge, 7-cm, Yueh catheter was  introduced. Thoracentesis was performed. The catheter was removed and a dressing applied. Procedure was stopped early secondary to chest pain with remaining small left pleural effusion seen postprocedure. FINDINGS: A total of approximately 750 mL of amber colored fluid was removed. Samples were sent to the laboratory as requested by the clinical team. IMPRESSION: Successful ultrasound guided left thoracentesis yielding 750 mL of pleural fluid. This exam was performed by Tsosie Billing PA-C, and was supervised and interpreted by Dr. Kathlene Cote. Electronically Signed   By: Aletta Edouard M.D.   On: 03/06/2021 09:39    Assessment/Plan Sanchez Pfalzgraf is a 35 year old male with medical history significant for end-stage renal disease on hemodialysis (dialysis days are M/W/F) and hypertension who presents to the ER by EMS for evaluation of worsening shortness of breath for 2 weeks.  1.  End-stage renal disease: Patient has a fistula creation to the left upper extremity.  There is some question of the possibility of high-output heart failure related to his access.  Recommend undergoing a left upper extremity fistulogram with possible intervention and attempt to assess his anatomy.  Procedure, risk and benefits were explained to the patient.  All questions were answered.  He wishes to proceed.  We will plan on this tomorrow.  He does understand that if his access is responsible for his heart failure we will need to ligated in the operating room.  2.  Pleural effusion: Seen by interventional radiology Patient will need a chest tube  3.  Tobacco abuse: We had a discussion for approximately three minutes regarding the absolute need for smoking cessation due to the deleterious nature of tobacco on  the vascular system. We discussed the tobacco use would diminish patency of any intervention, and likely significantly worsen progressio of disease.   Discussed with Dr. Mayme Genta, PA-C 03/08/2021 3:05 PM  This note was created with Dragon medical transcription system.  Any error is purely unintentional.

## 2021-03-08 NOTE — Assessment & Plan Note (Signed)
Continue empiric antibiotics (Unasyn). Pleural effusion management as below per pulmonology.

## 2021-03-08 NOTE — Progress Notes (Signed)
Diet ordered and dinner order placed.

## 2021-03-08 NOTE — Consult Note (Signed)
Travis Long  MRN : [id number]  Travis Long is a 35 y.o. (Nov 30, 1985) male who presents with chief complaint of  Chief Complaint  Patient presents with   Shortness of Breath   History of Present Illness:  Travis Long is a 35 year old male with medical history significant for end-stage renal disease on hemodialysis (dialysis days are M/W/F) and hypertension who presents to the ER by EMS for evaluation of worsening shortness of breath for 2 weeks.  Patient states that he has been compliant to his dialysis sessions but missed one treatment last week.  Shortness of breath was initially with exertion but now because at rest.  He denies having any chest pain or lower extremity swelling.  Patient states that he has been noncompliant with his blood pressure medications because he had been unable to climb the stairs due to shortness of breath.  Chest x-ray reviewed by me shows increased opacification of left hemithorax likely due to large pleural effusion and associated atelectasis.  There is a small right pleural effusion.  Patient has been followed by nephrology.  In the setting of possible high output heart failure related to the patient's left upper extremity dialysis access vascular surgery was consulted by Dr. Mortimer Fries for possible intervention.  Current Facility-Administered Medications  Medication Dose Route Frequency Provider Last Rate Last Admin   0.9 %  sodium chloride infusion (Manually program via Guardrails IV Fluids)   Intravenous Once Agbata, Tochukwu, MD       0.9 %  sodium chloride infusion  250 mL Intravenous PRN Agbata, Tochukwu, MD       acetaminophen (TYLENOL) tablet 650 mg  650 mg Oral Q6H PRN Agbata, Tochukwu, MD       Or   acetaminophen (TYLENOL) suppository 650 mg  650 mg Rectal Q6H PRN Agbata, Tochukwu, MD       alteplase (tPA) 10mg  in NS 40mL for Dr.Oaks (intrapleural administration/[facility])   Intrapleural Once Flora Lipps, MD        amLODipine (NORVASC) tablet 10 mg  10 mg Oral Daily Agbata, Tochukwu, MD   10 mg at 03/07/21 1226   Ampicillin-Sulbactam (UNASYN) 3 g in sodium chloride 0.9 % 100 mL IVPB  3 g Intravenous Q12H Agbata, Tochukwu, MD   Stopped at 03/07/21 2208   calcium acetate (PHOSLO) capsule 1,334 mg  1,334 mg Oral TID WC Agbata, Tochukwu, MD   1,334 mg at 03/07/21 1612   carvedilol (COREG) tablet 12.5 mg  12.5 mg Oral BID WC Doristine Mango L, MD   12.5 mg at 03/07/21 1612   Chlorhexidine Gluconate Cloth 2 % PADS 6 each  6 each Topical Q0600 Colon Flattery, NP       epoetin alfa (EPOGEN) 4000 UNIT/ML injection            hydrALAZINE (APRESOLINE) tablet 10 mg  10 mg Oral Q8H Doristine Mango L, MD   10 mg at 03/08/21 0557   hydrOXYzine (ATARAX) tablet 10 mg  10 mg Oral TID PRN Richarda Osmond, MD       LORazepam (ATIVAN) tablet 0.5 mg  0.5 mg Oral Once PRN Richarda Osmond, MD       Or   LORazepam (ATIVAN) injection 0.5 mg  0.5 mg Intramuscular Once PRN Richarda Osmond, MD       losartan (COZAAR) tablet 100 mg  100 mg Oral Daily Agbata, Tochukwu, MD   100 mg at 03/07/21 1224   minoxidil (LONITEN) tablet  5 mg  5 mg Oral Daily Agbata, Tochukwu, MD   5 mg at 03/06/21 1023   ondansetron (ZOFRAN) tablet 4 mg  4 mg Oral Q6H PRN Agbata, Tochukwu, MD       Or   ondansetron (ZOFRAN) injection 4 mg  4 mg Intravenous Q6H PRN Agbata, Tochukwu, MD       sodium chloride flush (NS) 0.9 % injection 3 mL  3 mL Intravenous Q12H Agbata, Tochukwu, MD   3 mL at 03/07/21 2122   sodium chloride flush (NS) 0.9 % injection 3 mL  3 mL Intravenous PRN Agbata, Tochukwu, MD       Past Medical History:  Diagnosis Date   ESRD on hemodialysis ([facility])    Hypertension    Past Surgical History:  Procedure Laterality Date   AV FISTULA PLACEMENT Left    Social History Social History   Tobacco Use   Smoking status: Former    Types: Cigarettes   Smokeless tobacco: Never  Substance Use Topics   Alcohol use: Not  Currently   Drug use: Never   Family History Family History  Problem Relation Age of Onset   Hypertension Mother    Diabetes Mellitus II Mother   Denies family history of peripheral artery disease, venous disease or renal disease.  No Known Allergies  REVIEW OF SYSTEMS (Negative unless checked)  Constitutional: [] Weight loss  [] Fever  [] Chills Cardiac: [] Chest pain   [] Chest pressure   [] Palpitations   [] Shortness of breath when laying flat   [] Shortness of breath at rest   [x] Shortness of breath with exertion. Vascular:  [] Pain in legs with walking   [] Pain in legs at rest   [] Pain in legs when laying flat   [] Claudication   [] Pain in feet when walking  [] Pain in feet at rest  [] Pain in feet when laying flat   [] History of DVT   [] Phlebitis   [] Swelling in legs   [] Varicose veins   [] Non-healing ulcers Pulmonary:   [] Uses home oxygen   [] Productive cough   [] Hemoptysis   [] Wheeze  [] COPD   [] Asthma Neurologic:  [] Dizziness  [] Blackouts   [] Seizures   [] History of stroke   [] History of TIA  [] Aphasia   [] Temporary blindness   [] Dysphagia   [] Weakness or numbness in arms   [] Weakness or numbness in legs Musculoskeletal:  [] Arthritis   [] Joint swelling   [] Joint pain   [] Low back pain Hematologic:  [] Easy bruising  [] Easy bleeding   [] Hypercoagulable state   [x] Anemic  [] Hepatitis Gastrointestinal:  [] Blood in stool   [] Vomiting blood  [] Gastroesophageal reflux/heartburn   [] Difficulty swallowing. Genitourinary:  [x] Chronic kidney disease   [] Difficult urination  [] Frequent urination  [] Burning with urination   [] Blood in urine Skin:  [] Rashes   [] Ulcers   [] Wounds Psychological:  [] History of anxiety   []  History of major depression.  Physical Examination  Vitals:   03/08/21 1146 03/08/21 1149 03/08/21 1200 03/08/21 1243  BP: (!) 155/94  (!) 163/105   Pulse: (!) 107  (!) 104   Resp: 20  (!) 22 20  Temp: 98.4 F (36.9 C)     TempSrc: Oral     SpO2:   100%   Weight:  87.6 kg     Height:       Body mass index is 28.52 kg/m. Gen:  WD/WN, NAD Head: [place]/AT, No temporalis wasting. Prominent temp pulse not noted. Ear/Nose/Throat: Hearing grossly intact, nares w/o erythema or drainage, oropharynx w/o Erythema/Exudate Eyes: Sclera  non-icteric, conjunctiva clear Neck: Trachea midline.  No JVD.  Pulmonary:  Good air movement, respirations not labored, equal bilaterally.  Cardiac: RRR, normal S1, S2. Vascular:  Vessel Right Left  Radial Palpable Palpable  Ulnar Palpable Palpable   Left upper extremity: Extremity is soft.  Extremity is warm.  Fistula with good bruit and thrill.  Palpable radial pulse.  Gastrointestinal: soft, non-tender/non-distended. No guarding/reflex.  Musculoskeletal: M/S 5/5 throughout.  Extremities without ischemic changes.  No deformity or atrophy. No edema. Neurologic: Sensation grossly intact in extremities.  Symmetrical.  Speech is fluent. Motor exam as listed above. Psychiatric: Judgment intact, Mood & affect appropriate for pt's clinical situation. Dermatologic: No rashes or ulcers noted.  No cellulitis or open wounds. Lymph : No Cervical, Axillary, or Inguinal lymphadenopathy.  CBC Lab Results  Component Value Date   WBC 7.2 03/08/2021   HGB 6.9 (L) 03/08/2021   HCT 21.1 (L) 03/08/2021   MCV 90.2 03/08/2021   PLT 215 03/08/2021   BMET    Component Value Date/Time   NA 134 (L) 03/08/2021 0930   K 3.8 03/08/2021 0930   CL 95 (L) 03/08/2021 0930   CO2 26 03/08/2021 0930   GLUCOSE 86 03/08/2021 0930   BUN 41 (H) 03/08/2021 0930   CREATININE 9.65 (H) 03/08/2021 0930   CALCIUM 8.0 (L) 03/08/2021 0930   GFRNONAA 7 (L) 03/08/2021 0930   Estimated Creatinine Clearance: 11.9 mL/min (A) (by C-G formula based on SCr of 9.65 mg/dL (H)).  COAG Lab Results  Component Value Date   INR 1.2 03/07/2021   INR 1.2 12/01/2020   INR 1.1 09/16/2020   Radiology CT CHEST WO CONTRAST  Result Date: 03/06/2021 CLINICAL DATA:  Pneumonia, left  pleural effusion and status post left thoracentesis with removal of 750 mL of fluid. Post thoracentesis chest x-ray shows a small amount new air in the pleural space towards the apex. EXAM: CT CHEST WITHOUT CONTRAST TECHNIQUE: Multidetector CT imaging of the chest was performed following the standard protocol without IV contrast. COMPARISON:  Chest x-ray earlier today. FINDINGS: Cardiovascular: The heart size is within normal limits. No pericardial fluid identified. The thoracic aorta and central pulmonary arteries are normal in caliber. A tunneled dialysis catheter is present inserted via the lower right internal jugular vein and extending to the SVC/RA junction. Mediastinum/Nodes: Multiple scattered mediastinal lymph nodes are visualized. The largest in the right lower paratracheal region measure approximately 12 mm in maximum short axis diameter. Lymph nodes are most likely reactive given acute pulmonary findings. The thyroid gland appears unremarkable. Lungs/Pleura: There is consolidation of the left lower lobe likely consistent with acute pneumonia. Associated loculated parapneumonic pleural fluid on the left may be consistent with empyema. A small amount of air is present at the apex posteriorly in the pleural space likely introduced at the time of the thoracentesis procedure. This does not represent significant pneumothorax. There is a small right pleural effusion. Potential component of subtle pneumonia at the right lung base. Upper Abdomen: No acute abnormality. Musculoskeletal: No chest wall mass or suspicious bone lesions identified. IMPRESSION: 1. Left lower lobe consolidation consistent with acute pneumonia. Associated loculated parapneumonic pleural fluid may be consistent with developing empyema. A small amount of pleural air at the apex may have been introduced at the time of the thoracentesis and does not represent a significant pneumothorax. 2. Small right pleural effusion with possible component  of subtle pneumonia at the right lung base. 3. Multiple scattered small mediastinal lymph nodes which may be  reactive given pulmonary findings. Electronically Signed   By: Aletta Edouard M.D.   On: 03/06/2021 10:02   DG Chest Port 1 View  Result Date: 03/07/2021 CLINICAL DATA:  Travis Long's/pneumonia EXAM: PORTABLE CHEST 1 VIEW COMPARISON:  Previous studies including the examination of 03/06/2022 FINDINGS: Transverse diameter of heart is increased. Moderate to large left pleural effusion is present. There is small pocket of air in the left apical region with possible slight decrease. There is no evidence of right pneumothorax. Central pulmonary vessels are prominent. There is prominence of interstitial markings in the right parahilar region and right lower lung fields, possibly suggesting interstitial edema. Evaluation of left lung for infiltrates or pulmonary edema is limited due to overlying large left pleural effusion. Tip of dialysis catheter is seen in the right atrium close to its junction with superior vena cava. IMPRESSION: Moderate to large partly loculated left pleural effusion with no significant interval change. Small right pleural effusion. There are linear densities in the left mid and both lower lung fields suggesting atelectasis/pneumonia. Cardiomegaly. Central pulmonary vessels are prominent suggesting CHF. Electronically Signed   By: Elmer Picker M.D.   On: 03/07/2021 13:42   DG Chest Port 1 View  Result Date: 03/06/2021 CLINICAL DATA:  Post thoracentesis on the left. EXAM: PORTABLE CHEST 1 VIEW COMPARISON:  Same day chest radiograph. FINDINGS: No substantial change in size of a left pleural effusion, which is likely loculated. New small area of gas within the superior aspect of the left pleural effusion with air-fluid level. similar small right pleural effusion. Similar left lung opacification. Cardiac silhouette is largely obscured. Similar positioning of a right IJ dialysis  catheter. IMPRESSION: 1. No substantial change in size of a left pleural effusion, which is likely loculated. New small area of gas within the superior aspect of the left pleural effusion with air-fluid level, probably introduced during interval thoracentesis. Recommend follow-up chest radiograph two ensure stability/resolution and exclude developing pneumothorax. 2. Similar left lung opacification, probably compressive atelectasis. Pneumonia is not excluded. 3. Similar small right pleural effusion. Findings discussed with the patient's nurse [place] who will inform the physician. Electronically Signed   By: Margaretha Sheffield M.D.   On: 03/06/2021 09:15   DG Chest Portable 1 View  Result Date: 03/06/2021 CLINICAL DATA:  Chest pain EXAM: PORTABLE CHEST 1 VIEW COMPARISON:  12/01/2020 FINDINGS: Increased opacification of the left hemithorax likely due to large pleural effusion and associated atelectasis. Small right pleural effusion is also present with adjacent atelectasis. Cardiomediastinal contours are obscured. Right dialysis catheter is present. IMPRESSION: Increased opacification of left hemithorax likely due to large pleural effusion and associated atelectasis. There is also a small right pleural effusion with adjacent atelectasis. Electronically Signed   By: Macy Mis M.D.   On: 03/06/2021 07:54   US THORACENTESIS ASP PLEURAL SPACE W/IMG GUIDE  Result Date: 03/06/2021 INDICATION: Patient with shortness of breath and left pleural effusion request received for thoracentesis. EXAM: ULTRASOUND GUIDED LEFT THORACENTESIS MEDICATIONS: Local 1% lidocaine only. COMPLICATIONS: None immediate. PROCEDURE: An ultrasound guided thoracentesis was thoroughly discussed with the patient and questions answered. The benefits, risks, alternatives and complications were also discussed. The patient understands and wishes to proceed with the procedure. Written consent was obtained. Ultrasound was performed to localize and  mark an adequate pocket of fluid in the left chest. The area was then prepped and draped in the normal sterile fashion. 1% Lidocaine was used for local anesthesia. Under ultrasound guidance a 19 gauge, 7-cm, Yueh catheter was  introduced. Thoracentesis was performed. The catheter was removed and a dressing applied. Procedure was stopped early secondary to chest pain with remaining small left pleural effusion seen postprocedure. FINDINGS: A total of approximately 750 mL of amber colored fluid was removed. Samples were sent to the laboratory as requested by the clinical team. IMPRESSION: Successful ultrasound guided left thoracentesis yielding 750 mL of pleural fluid. This exam was performed by Tsosie Billing PA-C, and was supervised and interpreted by Dr. Kathlene Cote. Electronically Signed   By: Aletta Edouard M.D.   On: 03/06/2021 09:39    Assessment/Plan Travis Long is a 35 year old male with medical history significant for end-stage renal disease on hemodialysis (dialysis days are M/W/F) and hypertension who presents to the ER by EMS for evaluation of worsening shortness of breath for 2 weeks.  1.  End-stage renal disease: Patient has a fistula creation to the left upper extremity.  There is some question of the possibility of high-output heart failure related to his access.  Recommend undergoing a left upper extremity fistulogram with possible intervention and attempt to assess his anatomy.  Procedure, risk and benefits were explained to the patient.  All questions were answered.  He wishes to proceed.  We will plan on this tomorrow.  He does understand that if his access is responsible for his heart failure we will need to ligated in the operating room.  2.  Pleural effusion: Seen by interventional radiology Patient will need a chest tube  3.  Tobacco abuse: We had a discussion for approximately three minutes regarding the absolute need for smoking cessation due to the deleterious nature of tobacco on  the vascular system. We discussed the tobacco use would diminish patency of any intervention, and likely significantly worsen progressio of disease.   Discussed with Dr. Mayme Genta, PA-C 03/08/2021 3:05 PM  This Long was created with Dragon medical transcription system.  Any error is purely unintentional.

## 2021-03-08 NOTE — Assessment & Plan Note (Signed)
Presented with shortness of breath, pleural effusions on chest x-ray in the setting of noncompliance with antihypertensives resulting in hypertensive urgency. -- Volume removal by dialysis -- Pleural effusion management as below -- Follow-up pending echo -- Strict I/O's and daily weights

## 2021-03-08 NOTE — Consult Note (Signed)
Chief Complaint: Patient was seen in consultation today for left loculated pleural effusion at the request of  Flora Lipps, MD   Referring Physician(s): Flora Lipps, MD   Supervising Physician: Arne Cleveland  Patient Status: [facility] - In-pt  History of Present Illness: Cougar Imel is a 35 y.o. male with PMHx significant for ESRD on HD and HTN with previous pleural effusions s/p multiple thoracentesis. The patient complains of shortness of breath x 2 weeks and presented to ED on 12/12, imaging revealed loculated left pleural effusion s/p successful left thoracentesis yielding 750 mL on 12/12, no significant improvement in symptoms. Follow-up CT 12/12 revealed left lower lobe consolidation consistent with acute pneumonia. Associated loculated parapneumonic pleural fluid may be consistent with developing empyema. A small amount of pleural air at the apex may have been introduced at the time of the thoracentesis and does not represent a significant pneumothorax. Small right pleural effusion with possible component of subtle pneumonia at the right lung base. Pulmonology has seen the patient and request for IR image guided left pleural drainage catheter placement.   The patient complains of ongoing shortness of breath today, has finished HD today during my exam.  He has no known complications to sedation and is willing to proceed with chest tube today.  Past Medical History:  Diagnosis Date   ESRD on hemodialysis ([place])    Hypertension     Past Surgical History:  Procedure Laterality Date   AV FISTULA PLACEMENT Left     Allergies: Patient has no known allergies.  Medications: Prior to Admission medications   Medication Sig Start Date End Date Taking? Authorizing Provider  amLODipine (NORVASC) 10 MG tablet Take 1 tablet (10 mg total) by mouth daily. 12/02/20  Yes Sharen Hones, MD  calcium acetate (PHOSLO) 667 MG capsule Take 2 capsules (1,334 mg total) by mouth 3 (three) times  daily. 12/02/20  Yes Sharen Hones, MD  carvedilol (COREG) 6.25 MG tablet Take 1 tablet (6.25 mg total) by mouth in the morning and at bedtime. 12/02/20  Yes Sharen Hones, MD  furosemide (LASIX) 80 MG tablet Take 80 mg by mouth daily.   Yes [provider]  losartan (COZAAR) 100 MG tablet Take 1 tablet (100 mg total) by mouth daily. 12/02/20  Yes Sharen Hones, MD  minoxidil (LONITEN) 2.5 MG tablet Take 2 tablets (5 mg total) by mouth daily. 12/03/20  Yes Sharen Hones, MD     Family History  Problem Relation Age of Onset   Hypertension Mother    Diabetes Mellitus II Mother     Social History   Socioeconomic History   Marital status: Single    Spouse name: Not on file   Number of children: Not on file   Years of education: Not on file   Highest education level: Not on file  Occupational History   Not on file  Tobacco Use   Smoking status: Former    Types: Cigarettes   Smokeless tobacco: Never  Substance and Sexual Activity   Alcohol use: Not Currently   Drug use: Never   Sexual activity: Not on file  Other Topics Concern   Not on file  Social History Narrative   Not on file   Social Determinants of Health   Financial Resource Strain: Not on file  Food Insecurity: Not on file  Transportation Needs: Not on file  Physical Activity: Not on file  Stress: Not on file  Social Connections: Not on file   Review of Systems: A  12 point ROS discussed and pertinent positives are indicated in the HPI above.  All other systems are negative.  Review of Systems  Vital Signs: BP (!) 163/105    Pulse (!) 104    Temp 98.4 F (36.9 C) (Oral)    Resp 20    Ht 5\' 9"  (1.753 m)    Wt 193 lb 2 oz (87.6 kg) Comment: bedscale   SpO2 100%    BMI 28.52 kg/m   Physical Exam Constitutional:      Appearance: He is well-developed.  HENT:     Head: Normocephalic and atraumatic.  Cardiovascular:     Rate and Rhythm: Regular rhythm. Tachycardia present.  Pulmonary:     Effort: No respiratory  distress.     Comments: Coarse breath sounds Neurological:     Mental Status: He is alert and oriented to person, place, and time.  Right chest HD cath  Imaging: CT CHEST WO CONTRAST  Result Date: 03/06/2021 CLINICAL DATA:  Pneumonia, left pleural effusion and status post left thoracentesis with removal of 750 mL of fluid. Post thoracentesis chest x-ray shows a small amount new air in the pleural space towards the apex. EXAM: CT CHEST WITHOUT CONTRAST TECHNIQUE: Multidetector CT imaging of the chest was performed following the standard protocol without IV contrast. COMPARISON:  Chest x-ray earlier today. FINDINGS: Cardiovascular: The heart size is within normal limits. No pericardial fluid identified. The thoracic aorta and central pulmonary arteries are normal in caliber. A tunneled dialysis catheter is present inserted via the lower right internal jugular vein and extending to the SVC/RA junction. Mediastinum/Nodes: Multiple scattered mediastinal lymph nodes are visualized. The largest in the right lower paratracheal region measure approximately 12 mm in maximum short axis diameter. Lymph nodes are most likely reactive given acute pulmonary findings. The thyroid gland appears unremarkable. Lungs/Pleura: There is consolidation of the left lower lobe likely consistent with acute pneumonia. Associated loculated parapneumonic pleural fluid on the left may be consistent with empyema. A small amount of air is present at the apex posteriorly in the pleural space likely introduced at the time of the thoracentesis procedure. This does not represent significant pneumothorax. There is a small right pleural effusion. Potential component of subtle pneumonia at the right lung base. Upper Abdomen: No acute abnormality. Musculoskeletal: No chest wall mass or suspicious bone lesions identified. IMPRESSION: 1. Left lower lobe consolidation consistent with acute pneumonia. Associated loculated parapneumonic pleural fluid  may be consistent with developing empyema. A small amount of pleural air at the apex may have been introduced at the time of the thoracentesis and does not represent a significant pneumothorax. 2. Small right pleural effusion with possible component of subtle pneumonia at the right lung base. 3. Multiple scattered small mediastinal lymph nodes which may be reactive given pulmonary findings. Electronically Signed   By: Aletta Edouard M.D.   On: 03/06/2021 10:02   DG Chest Port 1 View  Result Date: 03/07/2021 CLINICAL DATA:  Lucas's/pneumonia EXAM: PORTABLE CHEST 1 VIEW COMPARISON:  Previous studies including the examination of 03/06/2022 FINDINGS: Transverse diameter of heart is increased. Moderate to large left pleural effusion is present. There is small pocket of air in the left apical region with possible slight decrease. There is no evidence of right pneumothorax. Central pulmonary vessels are prominent. There is prominence of interstitial markings in the right parahilar region and right lower lung fields, possibly suggesting interstitial edema. Evaluation of left lung for infiltrates or pulmonary edema is limited due to overlying  large left pleural effusion. Tip of dialysis catheter is seen in the right atrium close to its junction with superior vena cava. IMPRESSION: Moderate to large partly loculated left pleural effusion with no significant interval change. Small right pleural effusion. There are linear densities in the left mid and both lower lung fields suggesting atelectasis/pneumonia. Cardiomegaly. Central pulmonary vessels are prominent suggesting CHF. Electronically Signed   By: Elmer Picker M.D.   On: 03/07/2021 13:42   DG Chest Port 1 View  Result Date: 03/06/2021 CLINICAL DATA:  Post thoracentesis on the left. EXAM: PORTABLE CHEST 1 VIEW COMPARISON:  Same day chest radiograph. FINDINGS: No substantial change in size of a left pleural effusion, which is likely loculated. New small  area of gas within the superior aspect of the left pleural effusion with air-fluid level. similar small right pleural effusion. Similar left lung opacification. Cardiac silhouette is largely obscured. Similar positioning of a right IJ dialysis catheter. IMPRESSION: 1. No substantial change in size of a left pleural effusion, which is likely loculated. New small area of gas within the superior aspect of the left pleural effusion with air-fluid level, probably introduced during interval thoracentesis. Recommend follow-up chest radiograph two ensure stability/resolution and exclude developing pneumothorax. 2. Similar left lung opacification, probably compressive atelectasis. Pneumonia is not excluded. 3. Similar small right pleural effusion. Findings discussed with the patient's nurse [place] who will inform the physician. Electronically Signed   By: Margaretha Sheffield M.D.   On: 03/06/2021 09:15   DG Chest Portable 1 View  Result Date: 03/06/2021 CLINICAL DATA:  Chest pain EXAM: PORTABLE CHEST 1 VIEW COMPARISON:  12/01/2020 FINDINGS: Increased opacification of the left hemithorax likely due to large pleural effusion and associated atelectasis. Small right pleural effusion is also present with adjacent atelectasis. Cardiomediastinal contours are obscured. Right dialysis catheter is present. IMPRESSION: Increased opacification of left hemithorax likely due to large pleural effusion and associated atelectasis. There is also a small right pleural effusion with adjacent atelectasis. Electronically Signed   By: Macy Mis M.D.   On: 03/06/2021 07:54   US THORACENTESIS ASP PLEURAL SPACE W/IMG GUIDE  Result Date: 03/06/2021 INDICATION: Patient with shortness of breath and left pleural effusion request received for thoracentesis. EXAM: ULTRASOUND GUIDED LEFT THORACENTESIS MEDICATIONS: Local 1% lidocaine only. COMPLICATIONS: None immediate. PROCEDURE: An ultrasound guided thoracentesis was thoroughly discussed with  the patient and questions answered. The benefits, risks, alternatives and complications were also discussed. The patient understands and wishes to proceed with the procedure. Written consent was obtained. Ultrasound was performed to localize and mark an adequate pocket of fluid in the left chest. The area was then prepped and draped in the normal sterile fashion. 1% Lidocaine was used for local anesthesia. Under ultrasound guidance a 19 gauge, 7-cm, Yueh catheter was introduced. Thoracentesis was performed. The catheter was removed and a dressing applied. Procedure was stopped early secondary to chest pain with remaining small left pleural effusion seen postprocedure. FINDINGS: A total of approximately 750 mL of amber colored fluid was removed. Samples were sent to the laboratory as requested by the clinical team. IMPRESSION: Successful ultrasound guided left thoracentesis yielding 750 mL of pleural fluid. This exam was performed by Tsosie Billing PA-C, and was supervised and interpreted by Dr. Kathlene Cote. Electronically Signed   By: Aletta Edouard M.D.   On: 03/06/2021 09:39    Labs:  CBC: Recent Labs    12/02/20 0834 03/06/21 0643 03/07/21 0624 03/08/21 0930  WBC 7.1 9.5 8.6 7.2  HGB 9.5*  6.9* 7.2* 6.9*  HCT 28.2* 21.6* 21.3* 21.1*  PLT 214 213 222 215    COAGS: Recent Labs    09/16/20 0609 12/01/20 0353 03/07/21 0624  INR 1.1 1.2 1.2  APTT  --  35  --     BMP: Recent Labs    12/02/20 0834 03/06/21 0643 03/07/21 0624 03/08/21 0930  NA 132* 133* 135 134*  K 3.6 4.7 5.1 3.8  CL 94* 96* 98 95*  CO2 28 23 28 26   GLUCOSE 130* 137* 94 86  BUN 34* 74* 47* 41*  CALCIUM 8.3* 8.8* 8.4* 8.0*  CREATININE 6.56* 18.63* 12.44* 9.65*  GFRNONAA 11* 3* 5* 7*    LIVER FUNCTION TESTS: Recent Labs    09/16/20 0658 12/02/20 0834 03/06/21 0643 03/08/21 0930  BILITOT 0.9  --  0.9  --   AST 21  --  18  --   ALT 11  --  10  --   ALKPHOS 54  --  57  --   PROT 8.3*  --  8.9*  --    ALBUMIN 4.1 3.2* 3.8 3.0*    Assessment and Plan: 35 year old male with PMHx significant for ESRD on HD and HTN with previous pleural effusions s/p multiple thoracentesis. The patient complains of shortness of breath x 2 weeks and presented to ED on 12/12, imaging revealed loculated left pleural effusion s/p successful left thoracentesis yielding 750 mL on 12/12, no significant improvement in symptoms. Follow-up CT 12/12 revealed left lower lobe consolidation consistent with acute pneumonia. Associated loculated parapneumonic pleural fluid may be consistent with developing empyema. A small amount of pleural air at the apex may have been introduced at the time of the thoracentesis and does not represent a significant pneumothorax. Small right pleural effusion with possible component of subtle pneumonia at the right lung base. Receiving IV Unasyn, Pulmonology has seen the patient and request for IR image guided left pleural drainage catheter placement.   The patient has been NPO, no blood thinners taken, imaging, labs and vitals have been reviewed.  Risks and benefits of chest tube placement were discussed with the patient including bleeding, infection, damage to adjacent structures, malfunction of the tube requiring additional procedures and sepsis.  All of the patient's questions were answered, patient is agreeable to proceed. Consent signed and in chart.  Thank you for this interesting consult.  I greatly enjoyed meeting Divante Kotch and look forward to participating in their care.  A copy of this report was sent to the requesting provider on this date.  Electronically Signed: Hedy Jacob, PA-C 03/08/2021, 2:40 PM   I spent a total of 20 Minutes in face to face in clinical consultation, greater than 50% of which was counseling/coordinating care for left loculated pleural effusion.

## 2021-03-08 NOTE — Assessment & Plan Note (Signed)
Presented with hypertensive urgency, not taking antihypertensives.  Intermittently declines recommended and routine care during hospital admission.

## 2021-03-08 NOTE — Assessment & Plan Note (Signed)
Hemoglobin declined to 6.9 on a.m. labs today. No evidence of bleeding. Suspect some dilutional component, having dialysis today with volume removal. -- Repeat H&H at 3 PM -- We will transfuse 1 unit PRBCs if still less than 7 -- Started on EPO with dialysis per nephrology

## 2021-03-08 NOTE — Progress Notes (Signed)
Upon entering room for bedside shift report, RN called patient's name and tapped his leg several times before rubbing his back vigorously to wake patient. RN requested to place new HD dressing; patient stated "okay." RN moved fan from patient's bedside table and asked patient to roll over and patient refused, states "you all are so rude coming in here, shaking me, waking me up, you don't know how long I've lived like this." RN attempted to explain importance of dressing for infection control; patient continued to refuse education and dressing placement. Fan placed back on patient at his request. Patient does not have dressing or biopatch in place at this time on HD line. Dr. Arbutus Ped, DO and nephrology aware.

## 2021-03-08 NOTE — Progress Notes (Signed)
Patient arrived to dialysis with no dialysis catheter dressing on. Patient stated he removed it to take a wash up and staff tried to replace, but he would not allow because he was sleeping.Dr.Singh made aware.

## 2021-03-08 NOTE — Progress Notes (Signed)
Chest tube clamped by Dr. Vilinda Flake at [phone] post tPA. To be unclamped at 2045 and returned to -20 of suction

## 2021-03-09 ENCOUNTER — Inpatient Hospital Stay (HOSPITAL_COMMUNITY)
Admit: 2021-03-09 | Discharge: 2021-03-09 | Disposition: A | Discharge status: Home / Self Care | Payer: Medicare Other | Attending: Vascular Surgery | Admitting: Vascular Surgery

## 2021-03-09 ENCOUNTER — Encounter: Payer: Self-pay | Admitting: Vascular Surgery

## 2021-03-09 ENCOUNTER — Encounter
Admission: EM | Disposition: A | Discharge status: Other Acute Inpatient Hospital | Payer: Self-pay | Source: Ambulatory Visit | Attending: Internal Medicine

## 2021-03-09 ENCOUNTER — Inpatient Hospital Stay
Admit: 2021-03-09 | Discharge: 2021-03-09 | Disposition: A | Discharge status: Home / Self Care | Payer: Medicare Other | Attending: Internal Medicine | Admitting: Internal Medicine

## 2021-03-09 ENCOUNTER — Inpatient Hospital Stay: Payer: Medicare Other

## 2021-03-09 ENCOUNTER — Inpatient Hospital Stay: Payer: Medicare Other | Admitting: Radiology

## 2021-03-09 DIAGNOSIS — I5033 Acute on chronic diastolic (congestive) heart failure: Secondary | ICD-10-CM

## 2021-03-09 DIAGNOSIS — I5031 Acute diastolic (congestive) heart failure: Secondary | ICD-10-CM

## 2021-03-09 DIAGNOSIS — T82898A Other specified complication of vascular prosthetic devices, implants and grafts, initial encounter: Secondary | ICD-10-CM

## 2021-03-09 HISTORY — PX: A/V FISTULAGRAM: CATH118298

## 2021-03-09 HISTORY — PX: IR CATHETER TUBE CHANGE: IMG717

## 2021-03-09 LAB — ECHOCARDIOGRAM COMPLETE
AR max vel: 2.73 cm2
AV Area VTI: 3 cm2
AV Area mean vel: 3.08 cm2
AV Mean grad: 4 mmHg
AV Peak grad: 8.5 mmHg
Ao pk vel: 1.46 m/s
Area-P 1/2: 3.65 cm2
Height: 69 in
MV VTI: 2.04 cm2
S' Lateral: 3.1 cm
Weight: 3033.6 oz

## 2021-03-09 LAB — BODY FLUID CULTURE W GRAM STAIN: Culture: NO GROWTH

## 2021-03-09 LAB — CBC
HCT: 22 % — ABNORMAL LOW (ref 39.0–52.0)
Hemoglobin: 7.2 g/dL — ABNORMAL LOW (ref 13.0–17.0)
MCH: 30 pg (ref 26.0–34.0)
MCHC: 32.7 g/dL (ref 30.0–36.0)
MCV: 91.7 fL (ref 80.0–100.0)
Platelets: 230 10*3/uL (ref 150–400)
RBC: 2.4 MIL/uL — ABNORMAL LOW (ref 4.22–5.81)
RDW: 14.7 % (ref 11.5–15.5)
WBC: 10.5 10*3/uL (ref 4.0–10.5)
nRBC: 0 % (ref 0.0–0.2)

## 2021-03-09 LAB — BASIC METABOLIC PANEL
Anion gap: 11 (ref 5–15)
BUN: 49 mg/dL — ABNORMAL HIGH (ref 6–20)
CO2: 31 mmol/L (ref 22–32)
Calcium: 8.3 mg/dL — ABNORMAL LOW (ref 8.9–10.3)
Chloride: 93 mmol/L — ABNORMAL LOW (ref 98–111)
Creatinine, Ser: 12.44 mg/dL — ABNORMAL HIGH (ref 0.61–1.24)
GFR, Estimated: 5 mL/min — ABNORMAL LOW (ref >60)
Glucose, Bld: 127 mg/dL — ABNORMAL HIGH (ref 70–99)
Potassium: 4.7 mmol/L (ref 3.5–5.1)
Sodium: 135 mmol/L (ref 135–145)

## 2021-03-09 SURGERY — A/V FISTULAGRAM
Anesthesia: Moderate Sedation | Laterality: Left

## 2021-03-09 MED ORDER — HEPARIN SODIUM (PORCINE) 1000 UNIT/ML IJ SOLN
INTRAMUSCULAR | Status: AC
  Filled 2021-03-09: qty 10

## 2021-03-09 MED ORDER — MIDAZOLAM HCL 2 MG/2ML IJ SOLN
INTRAMUSCULAR | Status: AC
  Filled 2021-03-09: qty 2

## 2021-03-09 MED ORDER — SODIUM CHLORIDE (PF) 0.9 % IJ SOLN
Freq: Once | INTRAMUSCULAR | Status: DC
  Filled 2021-03-09: qty 10

## 2021-03-09 MED ORDER — FENTANYL CITRATE PF 50 MCG/ML IJ SOSY
PREFILLED_SYRINGE | INTRAMUSCULAR | Status: AC
  Filled 2021-03-09: qty 1

## 2021-03-09 MED ORDER — MIDAZOLAM HCL 2 MG/2ML IJ SOLN
INTRAMUSCULAR | Status: DC | PRN
  Administered 2021-03-09: 2 mg via INTRAVENOUS

## 2021-03-09 MED ORDER — SODIUM CHLORIDE (PF) 0.9 % IJ SOLN
10.0000 mg | Freq: Once | INTRAMUSCULAR | Status: AC
  Administered 2021-03-09: 10 mg via INTRAPLEURAL
  Filled 2021-03-09: qty 10

## 2021-03-09 MED ORDER — HYDRALAZINE HCL 50 MG PO TABS
25.0000 mg | ORAL_TABLET | Freq: Three times a day (TID) | ORAL | Status: DC
  Administered 2021-03-09: 25 mg via ORAL
  Filled 2021-03-09 (×2): qty 1

## 2021-03-09 MED ORDER — HYDRALAZINE HCL 20 MG/ML IJ SOLN
INTRAMUSCULAR | Status: AC
  Filled 2021-03-09: qty 1

## 2021-03-09 MED ORDER — LIDOCAINE HCL 1 % IJ SOLN
INTRAMUSCULAR | Status: AC
  Filled 2021-03-09: qty 20

## 2021-03-09 MED ORDER — HYDRALAZINE HCL 25 MG PO TABS
25.0000 mg | ORAL_TABLET | Freq: Once | ORAL | Status: AC
  Administered 2021-03-09: 25 mg via ORAL
  Filled 2021-03-09: qty 1

## 2021-03-09 MED ORDER — FENTANYL CITRATE (PF) 100 MCG/2ML IJ SOLN
INTRAMUSCULAR | Status: DC | PRN
  Administered 2021-03-09: 50 ug via INTRAVENOUS

## 2021-03-09 MED ORDER — HYDROMORPHONE HCL 1 MG/ML IJ SOLN
0.5000 mg | Freq: Once | INTRAMUSCULAR | Status: AC
  Administered 2021-03-09: 0.5 mg via INTRAVENOUS
  Filled 2021-03-09: qty 0.5

## 2021-03-09 MED ORDER — STERILE WATER FOR INJECTION IJ SOLN
5.0000 mg | Freq: Once | RESPIRATORY_TRACT | Status: AC
  Administered 2021-03-09: 5 mg via INTRAPLEURAL
  Filled 2021-03-09: qty 5

## 2021-03-09 MED ORDER — LIDOCAINE HCL 1 % IJ SOLN
INTRAMUSCULAR | Status: DC | PRN
  Administered 2021-03-09: 6 mL

## 2021-03-09 SURGICAL SUPPLY — 6 items
CANNULA 5F STIFF (CANNULA) ×1 IMPLANT
COVER PROBE U/S 5X48 (MISCELLANEOUS) ×1 IMPLANT
DRAPE BRACHIAL (DRAPES) ×1 IMPLANT
PACK ANGIOGRAPHY (CUSTOM PROCEDURE TRAY) ×2 IMPLANT
SHEATH BRITE TIP 6FRX5.5 (SHEATH) ×1 IMPLANT
SUT MNCRL AB 4-0 PS2 18 (SUTURE) ×1 IMPLANT

## 2021-03-09 NOTE — Assessment & Plan Note (Signed)
Continue empiric antibiotics (Unasyn). Pleural effusion management as below per pulmonology.

## 2021-03-09 NOTE — Assessment & Plan Note (Signed)
Status postthoracentesis on 03/06/2021 with 750 cc fluid removed.  Fluid studies consistent with mixed exudative and transudative processes.  Suspect due to ESRD, uncontrolled hypertension and cardiomyopathy. 12/14 - chest tube placed, 1st TPA  --Pulmonology managing. --12/15 - tube partially removed, IR to replace --Plan is for 3 days tPA administration due to loculations. --Continue empiric antibiotics as outlined

## 2021-03-09 NOTE — Assessment & Plan Note (Addendum)
Presented with shortness of breath, pleural effusions on chest x-ray in the setting of noncompliance with antihypertensives resulting in hypertensive urgency. Echo 12/15: EF 60-65%, G2DD -- Volume removal by dialysis -- Pleural effusion management as below, per pulm -- Follow-up pending echo -- Strict I/O's and daily weights --Vascular surgery consulted to assess fistula for high output heart failure

## 2021-03-09 NOTE — Assessment & Plan Note (Signed)
Appears due to volume overload in the setting of renal failure and CHF, pleural effusions. Pneumonia may also be contributing. Management as outlined.

## 2021-03-09 NOTE — Care Management Important Message (Signed)
Important Message  Patient Details  Name: Ottie Tillery MRN: [id number] Date of Birth: [birth year]   Medicare Important Message Given:  Yes     Dannette Barbara 03/09/2021, 1:53 PM

## 2021-03-09 NOTE — Procedures (Signed)
Pre procedural Dx: Left sided empyema. Post procedural Dx: Same  Successful fluoroscopic guided exchange, up sizing and repositioning of now 16 French left-sided chest tube.   EBL: Trace Complications: None immediate  PLAN:  - Continued chest tube management as per the critical care team.  Ronny Bacon, MD Pager #: [phone]

## 2021-03-09 NOTE — Assessment & Plan Note (Signed)
Hemoglobin improved to 7.2 today. Pt frequently refused labs yesterday afternoon and earlier this AM.   No evidence of bleeding. Suspect some dilutional component, having dialysis today with volume removal. -- Monitor CBC's -- Started on EPO with dialysis per nephrology

## 2021-03-09 NOTE — Progress Notes (Signed)
[facility] Consultation     Date: 03/09/2021,   MRN# [id number] Travis Long 1985-07-11 Code Status:         CHIEF COMPLAINT:  SOB    SYNOPSIS  35 y.o. male with medical history significant for end-stage renal disease on hemodialysis (dialysis days are M/W/F) and hypertension who presents to the ER by EMS for evaluation of worsening shortness of breath for 2 weeks.   Chest x-ray reviewed by me shows increased opacification of left hemithorax likely due to large pleural effusion and associated atelectasis.   01/2020 [facility]acentesis at [facility] reported as straw colored fluid 11/2020 Thoracentesis evidence of exudative process 12/12 thoracentesis evidence of transudative process 12/14 PIGTAIL CHEST TUBE PLACED BY CT IR, 850 Cc's removed, BLOODY FLUID 12/14 40 MG TPA given through PIG-TAIL CHEST TUBE, AFTER CLAMPING TUBES 12/15 Came to evaluate patient, chest tube is half way out, we are UNABLE To provide TPA therapy at this time.   Case discussed with IR Doc to replace Chest tube and aim for placement into the APEX of the left lung. 12/15 plan to give mix of alteplase/TPA Patient tolerated procedure well  Plan for 2 hrs of patient rolling around in bed, while tubes clamped Instructions provided to bedside nurse to un-clamp(remove clamps) after 2 hrs of rolling around in bed.      MEDICATIONS    Home Medication:    Current Medication:   Current Facility-Administered Medications:    0.9 %  sodium chloride infusion (Manually program via Guardrails IV Fluids), , Intravenous, Once, Dew, Erskine Squibb, MD   0.9 %  sodium chloride infusion, 250 mL, Intravenous, PRN, Lucky Cowboy, Erskine Squibb, MD, Last Rate: 10 mL/hr at 03/09/21 1009, 250 mL at 03/09/21 1009   acetaminophen (TYLENOL) tablet 650 mg, 650 mg, Oral, Q6H PRN **OR** acetaminophen (TYLENOL) suppository 650 mg, 650 mg, Rectal, Q6H PRN, Lucky Cowboy, Erskine Squibb, MD   alteplase (CATHFLO ACTIVASE) 10 mg in sodium chloride (PF)  0.9 % 30 mL, 10 mg, Intrapleural, Once **AND** dornase alfa (PULMOZYME) 5 mg in sterile water (preservative free) 30 mL, 5 mg, Intrapleural, Once, Flora Lipps, MD   amLODipine (NORVASC) tablet 10 mg, 10 mg, Oral, Daily, Dew, Erskine Squibb, MD, 10 mg at 03/09/21 0913   Ampicillin-Sulbactam (UNASYN) 3 g in sodium chloride 0.9 % 100 mL IVPB, 3 g, Intravenous, Q12H, Dew, Erskine Squibb, MD, Last Rate: 200 mL/hr at 03/09/21 1159, 3 g at 03/09/21 1159   calcium acetate (PHOSLO) capsule 1,334 mg, 1,334 mg, Oral, TID WC, Algernon Huxley, MD, 1,334 mg at 03/09/21 1252   carvedilol (COREG) tablet 12.5 mg, 12.5 mg, Oral, BID WC, Algernon Huxley, MD, 12.5 mg at 03/09/21 1700   Chlorhexidine Gluconate Cloth 2 % PADS 6 each, 6 each, Topical, Q0600, Dew, Erskine Squibb, MD   fentaNYL (SUBLIMAZE) 50 MCG/ML injection, , , ,    hydrALAZINE (APRESOLINE) tablet 25 mg, 25 mg, Oral, Q8H, Griffith, Kelly A, DO   HYDROcodone-acetaminophen (NORCO/VICODIN) 5-325 MG per tablet 1-2 tablet, 1-2 tablet, Oral, Q4H PRN, Algernon Huxley, MD, 2 tablet at 03/09/21 1700   hydrOXYzine (ATARAX) tablet 10 mg, 10 mg, Oral, TID PRN, Lucky Cowboy, Erskine Squibb, MD   lidocaine (XYLOCAINE) 1 % (with pres) injection, , , ,    lidocaine (XYLOCAINE) 1 % (with pres) injection, , , PRN, Sandi Mariscal, MD, 6 mL at 03/09/21 1600   LORazepam (ATIVAN) tablet 0.5 mg, 0.5 mg, Oral, Once PRN **OR** LORazepam (ATIVAN) injection 0.5 mg, 0.5 mg,  Intramuscular, Once PRN, Lucky Cowboy, Erskine Squibb, MD   losartan (COZAAR) tablet 100 mg, 100 mg, Oral, Daily, Dew, Erskine Squibb, MD, 100 mg at 03/09/21 0913   midazolam (VERSED) 2 MG/2ML injection, , , ,    minoxidil (LONITEN) tablet 5 mg, 5 mg, Oral, Daily, Dew, Erskine Squibb, MD, 5 mg at 03/09/21 1153   ondansetron (ZOFRAN) tablet 4 mg, 4 mg, Oral, Q6H PRN **OR** ondansetron (ZOFRAN) injection 4 mg, 4 mg, Intravenous, Q6H PRN, Algernon Huxley, MD, 4 mg at 03/08/21 2341   sodium chloride flush (NS) 0.9 % injection 3 mL, 3 mL, Intravenous, Q12H, Dew, Erskine Squibb, MD, 3 mL at 03/08/21  2119     ALLERGIES   Patient has no known allergies.     REVIEW OF SYSTEMS    VS: BP (!) 171/104 (BP Location: Right Arm)    Pulse 95    Temp 98.3 F (36.8 C) (Oral)    Resp 20    Ht 5\' 9"  (1.753 m)    Wt 86 kg    SpO2 97%    BMI 28.00 kg/m      Review of Systems: Resp:   No cough, -sputum production, +shortness of breath,-wheezing, -hemoptysis,  Other:  All other systems negative  PHYSICAL EXAM     Physical Examination:   General Appearance: No distress  Pulmonary: normal breath sounds, No wheezing.  Chest tube in place(left side)    ALL OTHER ROS ARE NEGATIVE    LABS    Recent Labs    03/07/21 0624 03/08/21 0930 03/09/21 1319  HGB 7.2* 6.9* 7.2*  HCT 21.3* 21.1* 22.0*  MCV 89.1 90.2 91.7  WBC 8.6 7.2 10.5  BUN 47* 41* 49*  CREATININE 12.44* 9.65* 12.44*  GLUCOSE 94 86 127*  CALCIUM 8.4* 8.0* 8.3*  INR 1.2  --   --    ,    No results for input(s): PH in the last 72 hours.  Invalid input(s): PCO2, PO2, BASEEXCESS, BASEDEFICITE, TFT    CULTURE RESULTS   Recent Results (from the past 240 hour(s))  Resp Panel by RT-PCR (Flu A&B, Covid) Nasopharyngeal Swab     Status: None   Collection Time: 03/06/21  6:45 AM   Specimen: Nasopharyngeal Swab; Nasopharyngeal(NP) swabs in vial transport medium  Result Value Ref Range Status   SARS Coronavirus 2 by RT PCR NEGATIVE NEGATIVE Final    Comment: (NOTE) SARS-CoV-2 target nucleic acids are NOT DETECTED.  The SARS-CoV-2 RNA is generally detectable in upper respiratory specimens during the acute phase of infection. The lowest concentration of SARS-CoV-2 viral copies this assay can detect is 138 copies/mL. A negative result does not preclude SARS-Cov-2 infection and should not be used as the sole basis for treatment or other patient management decisions. A negative result may occur with  improper specimen collection/handling, submission of specimen other than nasopharyngeal swab, presence of viral  mutation(s) within the areas targeted by this assay, and inadequate number of viral copies(<138 copies/mL). A negative result must be combined with clinical observations, patient history, and epidemiological information. The expected result is Negative.  Fact Sheet for Patients:  EntrepreneurPulse.com.au  Fact Sheet for Healthcare Providers:  IncredibleEmployment.be  This test is no t yet approved or cleared by the [place] FDA and  has been authorized for detection and/or diagnosis of SARS-CoV-2 by FDA under an Emergency Use Authorization (EUA). This EUA will remain  in effect (meaning this test can be used) for the duration of the COVID-19 declaration under  Section 564(b)(1) of the Act, 21 U.S.C.section 360bbb-3(b)(1), unless the authorization is terminated  or revoked sooner.       Influenza A by PCR NEGATIVE NEGATIVE Final   Influenza B by PCR NEGATIVE NEGATIVE Final    Comment: (NOTE) The Xpert Xpress SARS-CoV-2/FLU/RSV plus assay is intended as an aid in the diagnosis of influenza from Nasopharyngeal swab specimens and should not be used as a sole basis for treatment. Nasal washings and aspirates are unacceptable for Xpert Xpress SARS-CoV-2/FLU/RSV testing.  Fact Sheet for Patients: EntrepreneurPulse.com.au  Fact Sheet for Healthcare Providers: IncredibleEmployment.be  This test is not yet approved or cleared by the [place] FDA and has been authorized for detection and/or diagnosis of SARS-CoV-2 by FDA under an Emergency Use Authorization (EUA). This EUA will remain in effect (meaning this test can be used) for the duration of the COVID-19 declaration under Section 564(b)(1) of the Act, 21 U.S.C. section 360bbb-3(b)(1), unless the authorization is terminated or revoked.  Performed at [facility], [address] [place], [place] [address]   Body fluid culture w Gram Stain      Status: None   Collection Time: 03/06/21  8:40 AM   Specimen: PATH Cytology Pleural fluid  Result Value Ref Range Status   Specimen Description   Final    PLEURAL Performed at [facility], [address], [place], [place] [address]    Special Requests   Final    NONE Performed at [facility], [address] [place], [place] [address]    Gram Stain   Final    RARE WBC PRESENT, PREDOMINANTLY MONONUCLEAR NO ORGANISMS SEEN    Culture   Final    NO GROWTH 3 DAYS Performed at [facility], [place] [address], [place], [place] [address]    Report Status 03/09/2021 FINAL  Final  MRSA Next Gen by PCR, Nasal     Status: None   Collection Time: 03/07/21  5:31 AM   Specimen: Nasal Mucosa; Nasal Swab  Result Value Ref Range Status   MRSA by PCR Next Gen NOT DETECTED NOT DETECTED Final    Comment: (NOTE) The GeneXpert MRSA Assay (FDA approved for NASAL specimens only), is one component of a comprehensive MRSA colonization surveillance program. It is not intended to diagnose MRSA infection nor to guide or monitor treatment for MRSA infections. Test performance is not FDA approved in patients less than 13 years old. Performed at [facility], [address], [place], [place] [address]           ASSESSMENT/PLAN   35 yo AAM with HTN ESRD on HD with abnormal CT chest with by me shows increased opacification of left hemithorax likely due to large pleural effusion and associated atelectasis with loculations, patient had repeated thoracentesis revealing a  mixed picture of exudative and transudate effusion-likely related to ESRD and HTN cardiomyopathy   Patient also showing signs of diastolic heart failure, will need to also consider high output cardiac failure from [place] for third TPA admin tomorrow and monitor output from chest tube To remove inflammation and loculations(patient receptive to this plan of action)     Patient  very satisfied with Plan of action and management. All questions answered   Corrin Parker, M.D.  [facility]  Medical Director [place] Director [facility] Cardio-Pulmonary Department

## 2021-03-09 NOTE — Assessment & Plan Note (Signed)
Hemodialysis MWF. Nephrology following. --Dialysis tomorrow

## 2021-03-09 NOTE — Interval H&P Note (Signed)
History and Physical Interval Note:  03/09/2021 10:00 AM  Travis Long  has presented today for surgery, with the diagnosis of ESRD.  The various methods of treatment have been discussed with the patient and family. After consideration of risks, benefits and other options for treatment, the patient has consented to  Procedure(s): A/V Fistulagram (Left) as a surgical intervention.  The patient's history has been reviewed, patient examined, no change in status, stable for surgery.  I have reviewed the patient's chart and labs.  Questions were answered to the patient's satisfaction.     [name]

## 2021-03-09 NOTE — Assessment & Plan Note (Signed)
Present on admission, due to noncompliance with antihypertensives.  Home regimen includes amlodipine, Coreg, losartan, minoxidil. BP improved with dialysis. -- Continue antihypertensives -- Increase hydralazine 10 >> 25 mg TID -- Monitor BP closely

## 2021-03-09 NOTE — Assessment & Plan Note (Addendum)
Presented with hypertensive urgency, not taking antihypertensives.  Intermittently declines recommended and routine care during hospital admission.  Declined multiple lab draws to monitor anemia and determine need for possible blood transfusion.

## 2021-03-09 NOTE — Progress Notes (Signed)
Came to evaluate patient, chest tube is half way out, we are UNABLE To provide TPA therapy at this time.   Case discussed with IR Doc to replace Chest tube and aim for placement into the APEX of the left lung.   We will assess patient after replacement of chest tube.

## 2021-03-09 NOTE — Progress Notes (Signed)
Patient to Ir today for CT 16 FR placement as patient had pulled out Ct that was placed yesterday partially. Local anesthetic given only since had eaten earlier. Tolerated well with stable vitals. Report called to care nurse on [place] with plan reviewed.

## 2021-03-09 NOTE — Progress Notes (Signed)
*  PRELIMINARY RESULTS* Echocardiogram 2D Echocardiogram has been performed.  Travis Long 03/09/2021, 2:14 PM

## 2021-03-09 NOTE — Progress Notes (Signed)
Patient is resting comfortably in bed. He is drowsy but arousable. Chest tube dressing noted to have new drainage. MD is already aware. Patient continues to removed O2, MD paged for tele-pulse ox orders.

## 2021-03-09 NOTE — Progress Notes (Signed)
Progress Note    Zoe Nordin   OVZ:[id number]  DOB: 05-25-1985  DOA: 03/06/2021     3 Date of Service: 03/09/2021   Brief Narrative Puneet Masoner is a 35 y.o. male with a PMH significant for HTN, ESRD on HD MWF who presented from home to the ED on 03/06/2021 with shortness of breath x 14 days. In the ED, it was found that they had not been adherent with blood pressure medications or dialysis as he missed 1 session last week. They were treated with scheduled dialysis and IV blood pressure medication.  Chest x-ray showed left-sided large pleural effusion with atelectasis.  Thoracentesis was performed.    Pulmonology and nephrology consulted. Patient was admitted to medicine service for further workup and management of shortness of breath as outlined in detail below.    Assessment and Plan * Acute on chronic diastolic CHF (congestive heart failure) (HCC) Presented with shortness of breath, pleural effusions on chest x-ray in the setting of noncompliance with antihypertensives resulting in hypertensive urgency. Echo 12/15: EF 60-65%, G2DD -- Volume removal by dialysis -- Pleural effusion management as below, per pulm -- Follow-up pending echo -- Strict I/O's and daily weights --Vascular surgery consulted to assess fistula for high output heart failure  CAP (community acquired pneumonia) Continue empiric antibiotics (Unasyn). Pleural effusion management as below per pulmonology.  SOB (shortness of breath) Appears due to volume overload in the setting of renal failure and CHF, pleural effusions. Pneumonia may also be contributing. Management as outlined.  Hypertensive urgency Present on admission, due to noncompliance with antihypertensives.  Home regimen includes amlodipine, Coreg, losartan, minoxidil. BP improved with dialysis. -- Continue antihypertensives -- Increase hydralazine 10 >> 25 mg TID -- Monitor BP closely  Loculated pleural effusion Status postthoracentesis on  03/06/2021 with 750 cc fluid removed.  Fluid studies consistent with mixed exudative and transudative processes.  Suspect due to ESRD, uncontrolled hypertension and cardiomyopathy. 12/14 - chest tube placed, 1st TPA  --Pulmonology managing. --12/15 - tube partially removed, IR to replace --Plan is for 3 days tPA administration due to loculations. --Continue empiric antibiotics as outlined   ESRD on hemodialysis ([place]) Hemodialysis MWF. Nephrology following. --Dialysis tomorrow  Anemia in ESRD (end-stage renal disease) (HCC) Hemoglobin improved to 7.2 today. Pt frequently refused labs yesterday afternoon and earlier this AM.   No evidence of bleeding. Suspect some dilutional component, having dialysis today with volume removal. -- Monitor CBC's -- Started on EPO with dialysis per nephrology  Tobacco abuse Counseled extensively on the importance of cessation  Medically noncompliant Presented with hypertensive urgency, not taking antihypertensives.  Intermittently declines recommended and routine care during hospital admission.  Declined multiple lab draws to monitor anemia and determine need for possible blood transfusion.     Subjective:  Pt seen this afternoon, had just eaten lunch and vomited afterward.  He thinks medication from fistulogram this AM was still in his system.  Reports still feeling short of breath.  No other acute complaints.  Chest tube partially pulled out this afternoon and needs to be replaced.  Pt reluctantly agreed to have labs drawn after I urged this as part of necessary monitor to guide medical decisions.  Objective Vitals:   03/09/21 1413 03/09/21 1505 03/09/21 1548 03/09/21 1555  BP: (!) 145/87 (!) 150/93 (!) 160/98 (!) 160/100  Pulse: 78 89 68 90  Resp: (!) 21 16 20 18   Temp:      TempSrc:      SpO2: 97% 100%  Weight:      Height:       86 kg  Vital signs were reviewed and unremarkable except for: BP remains elevated but improved.   Intermittently tachypneic   Exam General exam: awake, alert, no acute distress Respiratory system: CTAB, diminished bases, chest tube on the left present some bloody drainage on overlying gauze, normal respiratory effort. Cardiovascular system: normal S1/S2, RRR, no pedal edema.   Central nervous system: A&O x3. no gross focal neurologic deficits, normal speech Extremities: moves all, no edema, normal tone Skin: dry, intact, normal temperature Psychiatry: Irritable mood, congruent affect, judgement and insight appear normal   Labs / Other Information My review of labs, imaging, notes and other tests is significant for Glucose 127, BUN 49, creatinine 12.44, calcium 8.3, hemoglobin improved to 7.2    2D echo today showed EF 60 to 65% with grade 2 diastolic dysfunction.  Left upper extremity fistulogram today with typical fistula flow, not excessive that would be expected to be seen with high output heart failure.    Disposition Plan: Status is: Inpatient  Remains inpatient appropriate because: Severity of illness, chest tube remains in place for further tPA administration        Time spent: 30 minutes [facility] 03/09/2021, 4:26 PM

## 2021-03-09 NOTE — Assessment & Plan Note (Signed)
Counseled extensively on the importance of cessation

## 2021-03-09 NOTE — Progress Notes (Signed)
[facility]  ROUNDING NOTE   Subjective:   Travis Long is a 35 y.o. male with a past medical history significant for hypertension and end-stage renal disease on dialysis.  Patient presents to the emergency department from his outpatient dialysis clinic with complaints of worsening shortness of breath and elevated blood pressure.  He has been admitted for Shortness of breath [R06.02] Pleural effusion [J90] Pleural effusion, left [J90] Hypertensive urgency [I16.0]  Patient is known to our clinic and receives outpatient dialysis treatments at [facility], supervised by Dr. Holley Raring.    Patient seen resting in bed Currently NPO for procedure States chest tube insertion went well yesterday. Continues to complain so shortness of breath. Remains on 2L [place] Received dialysis yesterday with UF 1L removed.    Objective:  Vital signs in last 24 hours:  Temp:  [97.5 F (36.4 C)-98.6 F (37 C)] 98.1 F (36.7 C) (12/15 1003) Pulse Rate:  [86-116] 90 (12/15 1115) Resp:  [15-22] 15 (12/15 1115) BP: (155-197)/(82-126) 156/82 (12/15 1115) SpO2:  [83 %-100 %] 96 % (12/15 1115) Weight:  [86 kg] 86 kg (12/15 0500)  Weight change:  Filed Weights   03/08/21 0806 03/08/21 1149 03/09/21 0500  Weight: 88.6 kg 87.6 kg 86 kg    Intake/Output: I/O last 3 completed shifts: In: 139.3 [P.O.:120; IV Piggyback:19.3] Out: 3800 [Emesis/NG output:1500; Other:1000; Chest Tube:1300]   Intake/Output this shift:  No intake/output data recorded.  Physical Exam: General: NAD, resting quietly  Head: Normocephalic, atraumatic. Moist oral mucosal membranes  Eyes: Anicteric  Lungs:  Basilar crackles and coarse, normal effort, 2 L nasal cannula, cough  Heart: Regular rate and rhythm  Abdomen:  Soft, nontender, nondistended  Extremities: No peripheral edema.  Neurologic: Nonfocal, moving all four extremities  Skin: No lesions  Access: Right chest PermCath, LUE aVF    Basic Metabolic  Panel: Recent Labs  Lab 03/06/21 0643 03/07/21 0624 03/08/21 0930 03/09/21 1319  NA 133* 135 134* 135  K 4.7 5.1 3.8 4.7  CL 96* 98 95* 93*  CO2 23 28 26 31   GLUCOSE 137* 94 86 127*  BUN 74* 47* 41* 49*  CREATININE 18.63* 12.44* 9.65* 12.44*  CALCIUM 8.8* 8.4* 8.0* 8.3*  PHOS  --   --  3.8  --      Liver Function Tests: Recent Labs  Lab 03/06/21 0643 03/08/21 0930  AST 18  --   ALT 10  --   ALKPHOS 57  --   BILITOT 0.9  --   PROT 8.9*  --   ALBUMIN 3.8 3.0*    No results for input(s): LIPASE, AMYLASE in the last 168 hours. No results for input(s): AMMONIA in the last 168 hours.  CBC: Recent Labs  Lab 03/06/21 0643 03/07/21 0624 03/08/21 0930 03/09/21 1319  WBC 9.5 8.6 7.2 10.5  NEUTROABS 7.3  --   --   --   HGB 6.9* 7.2* 6.9* 7.2*  HCT 21.6* 21.3* 21.1* 22.0*  MCV 91.5 89.1 90.2 91.7  PLT 213 222 215 230     Cardiac Enzymes: No results for input(s): CKTOTAL, CKMB, CKMBINDEX, TROPONINI in the last 168 hours.  BNP: Invalid input(s): POCBNP  CBG: No results for input(s): GLUCAP in the last 168 hours.  Microbiology: Results for orders placed or performed during the hospital encounter of 03/06/21  Resp Panel by RT-PCR (Flu A&B, Covid) Nasopharyngeal Swab     Status: None   Collection Time: 03/06/21  6:45 AM   Specimen: Nasopharyngeal Swab;  Nasopharyngeal(NP) swabs in vial transport medium  Result Value Ref Range Status   SARS Coronavirus 2 by RT PCR NEGATIVE NEGATIVE Final    Comment: (NOTE) SARS-CoV-2 target nucleic acids are NOT DETECTED.  The SARS-CoV-2 RNA is generally detectable in upper respiratory specimens during the acute phase of infection. The lowest concentration of SARS-CoV-2 viral copies this assay can detect is 138 copies/mL. A negative result does not preclude SARS-Cov-2 infection and should not be used as the sole basis for treatment or other patient management decisions. A negative result may occur with  improper specimen  collection/handling, submission of specimen other than nasopharyngeal swab, presence of viral mutation(s) within the areas targeted by this assay, and inadequate number of viral copies(<138 copies/mL). A negative result must be combined with clinical observations, patient history, and epidemiological information. The expected result is Negative.  Fact Sheet for Patients:  EntrepreneurPulse.com.au  Fact Sheet for Healthcare Providers:  IncredibleEmployment.be  This test is no t yet approved or cleared by the [place] FDA and  has been authorized for detection and/or diagnosis of SARS-CoV-2 by FDA under an Emergency Use Authorization (EUA). This EUA will remain  in effect (meaning this test can be used) for the duration of the COVID-19 declaration under Section 564(b)(1) of the Act, 21 U.S.C.section 360bbb-3(b)(1), unless the authorization is terminated  or revoked sooner.       Influenza A by PCR NEGATIVE NEGATIVE Final   Influenza B by PCR NEGATIVE NEGATIVE Final    Comment: (NOTE) The Xpert Xpress SARS-CoV-2/FLU/RSV plus assay is intended as an aid in the diagnosis of influenza from Nasopharyngeal swab specimens and should not be used as a sole basis for treatment. Nasal washings and aspirates are unacceptable for Xpert Xpress SARS-CoV-2/FLU/RSV testing.  Fact Sheet for Patients: EntrepreneurPulse.com.au  Fact Sheet for Healthcare Providers: IncredibleEmployment.be  This test is not yet approved or cleared by the [place] FDA and has been authorized for detection and/or diagnosis of SARS-CoV-2 by FDA under an Emergency Use Authorization (EUA). This EUA will remain in effect (meaning this test can be used) for the duration of the COVID-19 declaration under Section 564(b)(1) of the Act, 21 U.S.C. section 360bbb-3(b)(1), unless the authorization is terminated or revoked.  Performed at [facility], [address] [place], [place] [address]   Body fluid culture w Gram Stain     Status: None   Collection Time: 03/06/21  8:40 AM   Specimen: PATH Cytology Pleural fluid  Result Value Ref Range Status   Specimen Description   Final    PLEURAL Performed at [facility], [address] [place], [place] [address]    Special Requests   Final    NONE Performed at [facility], [address] [place], [place] [address]    Gram Stain   Final    RARE WBC PRESENT, PREDOMINANTLY MONONUCLEAR NO ORGANISMS SEEN    Culture   Final    NO GROWTH 3 DAYS Performed at [facility], [place] [address], [place], [place] [address]    Report Status 03/09/2021 FINAL  Final  MRSA Next Gen by PCR, Nasal     Status: None   Collection Time: 03/07/21  5:31 AM   Specimen: Nasal Mucosa; Nasal Swab  Result Value Ref Range Status   MRSA by PCR Next Gen NOT DETECTED NOT DETECTED Final    Comment: (NOTE) The GeneXpert MRSA Assay (FDA approved for NASAL specimens only), is one component of a comprehensive  MRSA colonization surveillance program. It is not intended to diagnose MRSA infection nor to guide or monitor treatment for MRSA infections. Test performance is not FDA approved in patients less than 73 years old. Performed at [facility], [address] [place], [place] [address]     Coagulation Studies: Recent Labs    03/07/21 0624  LABPROT 14.9  INR 1.2     Urinalysis: No results for input(s): COLORURINE, LABSPEC, PHURINE, GLUCOSEU, HGBUR, BILIRUBINUR, KETONESUR, PROTEINUR, UROBILINOGEN, NITRITE, LEUKOCYTESUR in the last 72 hours.  Invalid input(s): APPERANCEUR    Imaging: PERIPHERAL VASCULAR CATHETERIZATION  Result Date: 03/09/2021 See surgical note for result.  DG Chest Port 1 View  Result Date: 03/09/2021 CLINICAL DATA:  Pleural effusion EXAM: PORTABLE CHEST 1 VIEW COMPARISON:  Chest x-ray dated December 13th 2022 FINDINGS:  Visualized cardiac and mediastinal contours are unchanged. Small left hydropneumothorax with chest tube in place. Small right pleural effusion. Right chest wall central venous catheter with tip positioned at the superior cavoatrial junction. Bilateral interstitial opacities, likely due to pulmonary edema. IMPRESSION: Small left hydropneumothorax with chest tube in place. Electronically Signed   By: Yetta Glassman M.D.   On: 03/09/2021 13:58     Medications:    sodium chloride 250 mL (03/09/21 1009)   ampicillin-sulbactam (UNASYN) IV 3 g (03/09/21 1159)    sodium chloride   Intravenous Once   alteplase (tPA) 10mg  in NS 62mL for Dr.Oaks (intrapleural administration/[facility])   Intrapleural Once   amLODipine  10 mg Oral Daily   calcium acetate  1,334 mg Oral TID WC   carvedilol  12.5 mg Oral BID WC   Chlorhexidine Gluconate Cloth  6 each Topical Q0600   fentaNYL       hydrALAZINE  10 mg Oral Q8H   losartan  100 mg Oral Daily   midazolam       minoxidil  5 mg Oral Daily   sodium chloride flush  3 mL Intravenous Q12H   sodium chloride, acetaminophen **OR** acetaminophen, HYDROcodone-acetaminophen, hydrOXYzine, LORazepam **OR** LORazepam, ondansetron **OR** ondansetron (ZOFRAN) IV  Assessment/ Plan:  [name] is a 35 y.o.  male with a past medical history significant for hypertension and end-stage renal disease on dialysis.  Patient presents to the emergency department from his outpatient dialysis clinic with complaints of worsening shortness of breath and elevated blood pressure.  He has been admitted for Shortness of breath [R06.02] Pleural effusion [J90] Pleural effusion, left [J90] Hypertensive urgency [I16.0]  [facility]/MWF/Rt Permcath-LUE AVF/ 82kg  End stage renal disease on dialysis: Will maintain outpatient scheduling, if possible. Next treatment scheduled for Friday.  2. Anemia of chronic kidney disease Lab Results  Component Value Date   HGB 7.2 (L)  03/09/2021  Hemoglobin slightly increased to 7.2. Will defer blood transfusion to primary team. Low dose EPO with treatment  3. Secondary Hyperparathyroidism:   Lab Results  Component Value Date   CALCIUM 8.3 (L) 03/09/2021   PHOS 3.8 03/08/2021    calcium and phosphorus within acceptable range  4.  Hypertension with chronic renal disease.  Home regimen includes amlodipine, carvedilol, losartan, and minoxidil.  Currently receiving amlodipine these medications along with IV labetalol. Blood pressure currently 156/82  5.  Community-acquired pneumonia, seen on chest CT.  Patient received thoracentesis which 750 mL of amber-colored pleural fluid removed on 03/06/21. Empirical antibiotic therapy, Unasyn azithromycin, ceftriaxone.  Patient received CT guided chest tube 03/08/21 with tPA admin. Will receive tPA for 2 days for inflammation and loculations.  Appreciate pulmonary recs    LOS: [place] 12/15/20222:00 PM

## 2021-03-09 NOTE — Op Note (Signed)
VEIN AND VASCULAR SURGERY    OPERATIVE NOTE   PROCEDURE: 1.   Left radiocephalic arteriovenous fistula cannulation under ultrasound guidance 2.   Left arm fistulagram including central venogram   PRE-OPERATIVE DIAGNOSIS: 1. ESRD 2. Poorly functional left radiocephalic AVF  POST-OPERATIVE DIAGNOSIS: same as above   SURGEON: [name], MD  ANESTHESIA: local with MCS  ESTIMATED BLOOD LOSS: 5 cc  FINDING(S): Very tortuous forearm cephalic vein portion of the fistula.  No focal stenoses.  Dual outflow through both the cephalic veins and the basilic veins in the upper arm.  No focal stenosis in the upper arm.  The central venous circulation was widely patent as well.  The flow was typical fistula flow and not excessive that would be expected to be seen with high-output heart failure.  SPECIMEN(S):  None  CONTRAST: 25 cc  FLUORO TIME: 0.4 minutes  MODERATE CONSCIOUS SEDATION TIME: Approximately 9 minutes with 2 mg of Versed and 50 mcg of Fentanyl   INDICATIONS: Travis Long is a 35 y.o. male who presents with malfunctioning and difficult access left radiocephalic arteriovenous fistula.  There is also some concern for high-output heart failure from his fistula.  The patient is scheduled for left arm fistulagram.  The patient is aware the risks include but are not limited to: bleeding, infection, thrombosis of the cannulated access, and possible anaphylactic reaction to the contrast.  The patient is aware of the risks of the procedure and elects to proceed forward.  DESCRIPTION: After full informed written consent was obtained, the patient was brought back to the angiography suite and placed supine upon the angiography table.  The patient was connected to monitoring equipment. Moderate conscious sedation was administered with a face to face encounter with the patient throughout the procedure with my supervision of the RN administering medicines and monitoring the patient's  vital signs and mental status throughout from the start of the procedure until the patient was taken to the recovery room. The left arm was prepped and draped in the standard fashion for a percutaneous access intervention.  Under ultrasound guidance, the left radiocephalic arteriovenous fistula was cannulated with a micropuncture needle under direct ultrasound guidance where it was patent and a permanent image was performed.  The microwire was advanced into the fistula and the needle was exchanged for the a microsheath.  Hand injections were completed to image the access including the central venous system. This demonstrated a very tortuous forearm cephalic vein portion of the fistula.  No focal stenoses.  Dual outflow through both the cephalic veins and the basilic veins in the upper arm.  No focal stenosis in the upper arm.  The central venous circulation was widely patent as well.  The flow was typical fistula flow and not excessive that would be expected to be seen with high-output heart failure.  Based on the images, this patient will need no endovascular intervention.  Consideration for surgical revision of tortuous forearm portion of the radiocephalic fistula can be given for improving ability to access.    Based on the completion imaging, no further intervention is necessary.  The wire and balloon were removed from the sheath.  A 4-0 Monocryl purse-string suture was sewn around the sheath.  The sheath was removed while tying down the suture.  A sterile bandage was applied to the puncture site.  COMPLICATIONS: None  CONDITION: Stable   [name]  03/09/2021 10:48 AM   This note was created with Dragon Medical transcription system. Any errors  in dictation are purely unintentional.

## 2021-03-10 ENCOUNTER — Inpatient Hospital Stay: Payer: Medicare Other

## 2021-03-10 ENCOUNTER — Inpatient Hospital Stay
Admission: AD | Admit: 2021-03-10 | Payer: Medicaid Other | Source: Other Acute Inpatient Hospital | Admitting: Internal Medicine

## 2021-03-10 DIAGNOSIS — J9819 Other pulmonary collapse: Secondary | ICD-10-CM

## 2021-03-10 DIAGNOSIS — F430 Acute stress reaction: Secondary | ICD-10-CM

## 2021-03-10 LAB — CBC
HCT: 20.2 % — ABNORMAL LOW (ref 39.0–52.0)
Hemoglobin: 6.5 g/dL — ABNORMAL LOW (ref 13.0–17.0)
MCH: 28.9 pg (ref 26.0–34.0)
MCHC: 32.2 g/dL (ref 30.0–36.0)
MCV: 89.8 fL (ref 80.0–100.0)
Platelets: 204 10*3/uL (ref 150–400)
RBC: 2.25 MIL/uL — ABNORMAL LOW (ref 4.22–5.81)
RDW: 14.8 % (ref 11.5–15.5)
WBC: 7.9 10*3/uL (ref 4.0–10.5)
nRBC: 0 % (ref 0.0–0.2)

## 2021-03-10 LAB — RENAL FUNCTION PANEL
Albumin: 3.1 g/dL — ABNORMAL LOW (ref 3.5–5.0)
Anion gap: 11 (ref 5–15)
BUN: 49 mg/dL — ABNORMAL HIGH (ref 6–20)
CO2: 31 mmol/L (ref 22–32)
Calcium: 8.2 mg/dL — ABNORMAL LOW (ref 8.9–10.3)
Chloride: 91 mmol/L — ABNORMAL LOW (ref 98–111)
Creatinine, Ser: 15.69 mg/dL — ABNORMAL HIGH (ref 0.61–1.24)
GFR, Estimated: 4 mL/min — ABNORMAL LOW (ref >60)
Glucose, Bld: 97 mg/dL (ref 70–99)
Phosphorus: 8 mg/dL — ABNORMAL HIGH (ref 2.5–4.6)
Potassium: 5.3 mmol/L — ABNORMAL HIGH (ref 3.5–5.1)
Sodium: 133 mmol/L — ABNORMAL LOW (ref 135–145)

## 2021-03-10 LAB — PREPARE RBC (CROSSMATCH)

## 2021-03-10 MED ORDER — EPOETIN ALFA 10000 UNIT/ML IJ SOLN: 4000.0000 [IU] | INTRAMUSCULAR | Status: DC

## 2021-03-10 MED ORDER — ONDANSETRON HCL 4 MG PO TABS: 4.0000 mg | ORAL_TABLET | Freq: Four times a day (QID) | ORAL | 0 refills | Status: DC | PRN

## 2021-03-10 MED ORDER — ZIPRASIDONE MESYLATE 20 MG IM SOLR
20.0000 mg | Freq: Once | INTRAMUSCULAR | Status: AC
  Administered 2021-03-10: 20 mg via INTRAMUSCULAR

## 2021-03-10 MED ORDER — LORAZEPAM 2 MG/ML IJ SOLN
4.0000 mg | Freq: Once | INTRAMUSCULAR | Status: AC
  Administered 2021-03-10: 4 mg via INTRAVENOUS
  Filled 2021-03-10: qty 2

## 2021-03-10 MED ORDER — HYDROCODONE-ACETAMINOPHEN 5-325 MG PO TABS: 1.0000 | ORAL_TABLET | ORAL | 0 refills | Status: DC | PRN

## 2021-03-10 MED ORDER — LORAZEPAM 2 MG/ML IJ SOLN: 1.0000 mg | Freq: Once | INTRAMUSCULAR | 0 refills | Status: AC

## 2021-03-10 MED ORDER — CALCIUM ACETATE (PHOS BINDER) 667 MG PO CAPS: 1334.0000 mg | ORAL_CAPSULE | Freq: Three times a day (TID) | ORAL | Status: AC

## 2021-03-10 MED ORDER — SODIUM CHLORIDE 0.9% IV SOLUTION: Freq: Once | INTRAVENOUS | Status: AC

## 2021-03-10 MED ORDER — ACETAMINOPHEN 325 MG PO TABS: 650.0000 mg | ORAL_TABLET | Freq: Four times a day (QID) | ORAL | Status: DC | PRN

## 2021-03-10 MED ORDER — HEPARIN SODIUM (PORCINE) 1000 UNIT/ML IJ SOLN
INTRAMUSCULAR | Status: AC
  Administered 2021-03-10: 3200 [IU]
  Filled 2021-03-10: qty 10

## 2021-03-10 MED ORDER — SODIUM CHLORIDE 0.9 % IV SOLN: 3.0000 g | Freq: Two times a day (BID) | INTRAVENOUS | Status: DC

## 2021-03-10 MED ORDER — SODIUM CHLORIDE 0.9 % IV SOLN: 250.0000 mL | INTRAVENOUS | 0 refills | Status: DC | PRN

## 2021-03-10 MED ORDER — DEXMEDETOMIDINE HCL IN NACL 400 MCG/100ML IV SOLN: 0.4000 ug/kg/h | INTRAVENOUS | Status: DC

## 2021-03-10 MED ORDER — DEXMEDETOMIDINE HCL IN NACL 400 MCG/100ML IV SOLN
0.4000 ug/kg/h | INTRAVENOUS | Status: DC
  Administered 2021-03-10: 1.2 ug/kg/h via INTRAVENOUS
  Administered 2021-03-10 (×2): 1 ug/kg/h via INTRAVENOUS
  Administered 2021-03-11: 1.2 ug/kg/h via INTRAVENOUS
  Filled 2021-03-10 (×5): qty 100

## 2021-03-10 MED ORDER — HYDROXYZINE HCL 10 MG PO TABS: 10.0000 mg | ORAL_TABLET | Freq: Three times a day (TID) | ORAL | 0 refills | Status: DC | PRN

## 2021-03-10 MED ORDER — LORAZEPAM 2 MG/ML IJ SOLN: 1.0000 mg | Freq: Once | INTRAMUSCULAR | Status: DC

## 2021-03-10 MED ORDER — HYDRALAZINE HCL 25 MG PO TABS
25.0000 mg | ORAL_TABLET | Freq: Three times a day (TID) | ORAL | Status: DC
Start: 2021-03-10 — End: 2021-03-12

## 2021-03-10 MED ORDER — LORAZEPAM 0.5 MG PO TABS: 0.5000 mg | ORAL_TABLET | Freq: Once | ORAL | 0 refills | Status: DC | PRN

## 2021-03-10 MED ORDER — CHLORHEXIDINE GLUCONATE CLOTH 2 % EX PADS: 6.0000 | MEDICATED_PAD | Freq: Every day | CUTANEOUS | Status: DC

## 2021-03-10 MED ORDER — SODIUM CHLORIDE 0.9% FLUSH: 3.0000 mL | Freq: Two times a day (BID) | INTRAVENOUS | Status: DC

## 2021-03-10 MED ORDER — CARVEDILOL 12.5 MG PO TABS: 12.5000 mg | ORAL_TABLET | Freq: Two times a day (BID) | ORAL | Status: DC

## 2021-03-10 MED ORDER — SODIUM CHLORIDE 0.9% IV SOLUTION: 10.0000 mL | Freq: Once | INTRAVENOUS | 0 refills | Status: AC

## 2021-03-10 NOTE — Progress Notes (Addendum)
Went to speak with patient regarding his refusing to wear oxygen, and refusing all care, medications, etc.  Bedside RN reported spO2 in the 60's and patient refusing to wear oxygen.  He tells me he doesn't need the oxygen because "I'm breathing fine.  I feel fine."  Patient's tongue is pale and lips appear cyanotic.  Counseled patient that sensation of shortness of breath does not always correlate with oxygen saturation levels, and that sat as low as his is currently is very dangerous, that he will get worse and could even die as a result.  His response was, "well okay then".  Asked patient if he was giving up on life and he answered, "yes pretty much".    I attempted to address patients concerns and questions about his care.  He is upset that the chest tube isn't working to get fluid out of his chest.  Explained the process of TPA for loculated pleural effusions taking time and repeat treatments.  He states he wants the tube taken out.  He is upset that he was not told a tube would be placed higher up when the initial one was to be exchanged after getting pulled out.  He continues to insist that he will not wear oxygen or take any medications, or let nurses or myself assess/examine him.    Patient is clearly paranoid and not trusting of any medical advice or counseling.    I attempted to call patient's mother and stepfather, listed as emergency contacts, but was not able to reach them.  Psychiatry is consulted for more thorough psychiatric assessment.  Case discussed in detail with bedside RN and unit director who are also attempting to counsel patient about his care and ciritcal to wear oxygen.   Exam: General exam: awake, alert, no acute distress HEENT: atraumatic, clear conjunctiva, anicteric sclera, hearing grossly normal  Respiratory system: normal respiratory effort, on room air, spO2 59%. Cardiovascular system: unable to examine, pt refuses. Gastrointestinal system: unable to examine, pt  refuses. Central nervous system: A&O x3. no gross focal neurologic deficits, normal speech Skin: appears dry, intact Psychiatry: agitated and belligerent mood, congruent affect, judgement and insight abnormal, pressured speech      Total time spent at bedside and coordinating care: 50 minutes

## 2021-03-10 NOTE — Assessment & Plan Note (Signed)
Presented with hypertensive urgency, not taking antihypertensives.  Intermittently declines recommended and routine care during hospital admission.  Declined multiple lab draws to monitor anemia and determine need for possible blood transfusion.

## 2021-03-10 NOTE — Assessment & Plan Note (Signed)
Appears due to volume overload in the setting of renal failure and CHF, pleural effusions. Pneumonia may also be contributing. Management as outlined.

## 2021-03-10 NOTE — Assessment & Plan Note (Signed)
Hemodialysis MWF. Nephrology following. --Dialysis per nephro

## 2021-03-10 NOTE — BH Assessment (Signed)
Pt  placed  under  IVC papers per  Gust Rung  NP  done  by  Karsten Fells  ED [facility]

## 2021-03-10 NOTE — Assessment & Plan Note (Signed)
Status postthoracentesis on 03/06/2021 with 750 cc fluid removed.  Fluid studies consistent with mixed exudative and transudative processes.  Suspect due to ESRD, uncontrolled hypertension and cardiomyopathy. 12/14 - chest tube placed, 1st TPA  12/15 - 2nd TPA, chest tube dislodged & replaced 12/16 - lung contours unchanged despite TPA, appears fibrin-trapped  --Pulmonology managing. --Continue empiric antibiotics as outlined --Transfer to [name] for CT surgery evaluation, potential VATS

## 2021-03-10 NOTE — Progress Notes (Signed)
BFR decreased from 400 to 300 d/t arterial pressure bottoming down. Lines were also reversed.

## 2021-03-10 NOTE — Progress Notes (Signed)
[facility]  ROUNDING NOTE   Subjective:   [name] is a 35 y.o. male with a past medical history significant for hypertension and end-stage renal disease on dialysis.  Patient presents to the emergency department from his outpatient dialysis clinic with complaints of worsening shortness of breath and elevated blood pressure.  He has been admitted for Shortness of breath [R06.02] Pleural effusion [J90] Pleural effusion, left [J90] Hypertensive urgency [I16.0]  Patient is known to our clinic and receives outpatient dialysis treatments at [facility], supervised by Dr. Holley Raring.    Patient was transferred to ICU this morning for closer monitoring.  He was hypoxic on room air.  He started to refuse wearing oxygen.  Psychiatry consult was also requested for evaluation.   Currently requiring safety observation   Objective:  Vital signs in last 24 hours:  Temp:  [97.4 F (36.3 C)-98.9 F (37.2 C)] 98.7 F (37.1 C) (12/16 0900) Pulse Rate:  [68-122] 102 (12/16 1100) Resp:  [16-35] 20 (12/16 1100) BP: (145-171)/(78-104) 148/78 (12/16 1100) SpO2:  [59 %-100 %] 99 % (12/16 1100)  Weight change:  Filed Weights   03/08/21 0806 03/08/21 1149 03/09/21 0500  Weight: 88.6 kg 87.6 kg 86 kg    Intake/Output: I/O last 3 completed shifts: In: 388.5 [P.O.:240; I.V.:48.5; IV Piggyback:100] Out: 2390 [Emesis/NG output:1500; Chest Tube:890]   Intake/Output this shift:  No intake/output data recorded.  Physical Exam: General: NAD, resting quietly  Head: Normocephalic, atraumatic. Moist oral mucosal membranes  Eyes: Anicteric  Lungs:  Hope O2, chest tube in place  Heart: Regular rate and rhythm  Abdomen:  Soft, nontender, nondistended  Extremities: No peripheral edema.  Neurologic: Nonfocal, moving all four extremities  Skin: No lesions  Access: Right chest PermCath, LUE aVF    Basic Metabolic Panel: Recent Labs  Lab 03/06/21 0643 03/07/21 0624 03/08/21 0930  03/09/21 1319  NA 133* 135 134* 135  K 4.7 5.1 3.8 4.7  CL 96* 98 95* 93*  CO2 23 28 26 31   GLUCOSE 137* 94 86 127*  BUN 74* 47* 41* 49*  CREATININE 18.63* 12.44* 9.65* 12.44*  CALCIUM 8.8* 8.4* 8.0* 8.3*  PHOS  --   --  3.8  --      Liver Function Tests: Recent Labs  Lab 03/06/21 0643 03/08/21 0930  AST 18  --   ALT 10  --   ALKPHOS 57  --   BILITOT 0.9  --   PROT 8.9*  --   ALBUMIN 3.8 3.0*    No results for input(s): LIPASE, AMYLASE in the last 168 hours. No results for input(s): AMMONIA in the last 168 hours.  CBC: Recent Labs  Lab 03/06/21 0643 03/07/21 0624 03/08/21 0930 03/09/21 1319  WBC 9.5 8.6 7.2 10.5  NEUTROABS 7.3  --   --   --   HGB 6.9* 7.2* 6.9* 7.2*  HCT 21.6* 21.3* 21.1* 22.0*  MCV 91.5 89.1 90.2 91.7  PLT 213 222 215 230     Cardiac Enzymes: No results for input(s): CKTOTAL, CKMB, CKMBINDEX, TROPONINI in the last 168 hours.  BNP: Invalid input(s): POCBNP  CBG: No results for input(s): GLUCAP in the last 168 hours.  Microbiology: Results for orders placed or performed during the hospital encounter of 03/06/21  Resp Panel by RT-PCR (Flu A&B, Covid) Nasopharyngeal Swab     Status: None   Collection Time: 03/06/21  6:45 AM   Specimen: Nasopharyngeal Swab; Nasopharyngeal(NP) swabs in vial transport medium  Result Value Ref  Range Status   SARS Coronavirus 2 by RT PCR NEGATIVE NEGATIVE Final    Comment: (NOTE) SARS-CoV-2 target nucleic acids are NOT DETECTED.  The SARS-CoV-2 RNA is generally detectable in upper respiratory specimens during the acute phase of infection. The lowest concentration of SARS-CoV-2 viral copies this assay can detect is 138 copies/mL. A negative result does not preclude SARS-Cov-2 infection and should not be used as the sole basis for treatment or other patient management decisions. A negative result may occur with  improper specimen collection/handling, submission of specimen other than nasopharyngeal swab,  presence of viral mutation(s) within the areas targeted by this assay, and inadequate number of viral copies(<138 copies/mL). A negative result must be combined with clinical observations, patient history, and epidemiological information. The expected result is Negative.  Fact Sheet for Patients:  EntrepreneurPulse.com.au  Fact Sheet for Healthcare Providers:  IncredibleEmployment.be  This test is no t yet approved or cleared by the [place] FDA and  has been authorized for detection and/or diagnosis of SARS-CoV-2 by FDA under an Emergency Use Authorization (EUA). This EUA will remain  in effect (meaning this test can be used) for the duration of the COVID-19 declaration under Section 564(b)(1) of the Act, 21 U.S.C.section 360bbb-3(b)(1), unless the authorization is terminated  or revoked sooner.       Influenza A by PCR NEGATIVE NEGATIVE Final   Influenza B by PCR NEGATIVE NEGATIVE Final    Comment: (NOTE) The Xpert Xpress SARS-CoV-2/FLU/RSV plus assay is intended as an aid in the diagnosis of influenza from Nasopharyngeal swab specimens and should not be used as a sole basis for treatment. Nasal washings and aspirates are unacceptable for Xpert Xpress SARS-CoV-2/FLU/RSV testing.  Fact Sheet for Patients: EntrepreneurPulse.com.au  Fact Sheet for Healthcare Providers: IncredibleEmployment.be  This test is not yet approved or cleared by the [place] FDA and has been authorized for detection and/or diagnosis of SARS-CoV-2 by FDA under an Emergency Use Authorization (EUA). This EUA will remain in effect (meaning this test can be used) for the duration of the COVID-19 declaration under Section 564(b)(1) of the Act, 21 U.S.C. section 360bbb-3(b)(1), unless the authorization is terminated or revoked.  Performed at [facility], [address] [place], [place] [address]   Body fluid  culture w Gram Stain     Status: None   Collection Time: 03/06/21  8:40 AM   Specimen: PATH Cytology Pleural fluid  Result Value Ref Range Status   Specimen Description   Final    PLEURAL Performed at [facility], [address] [place], [place] [address]    Special Requests   Final    NONE Performed at [facility], [address] [place], [place] [address]    Gram Stain   Final    RARE WBC PRESENT, PREDOMINANTLY MONONUCLEAR NO ORGANISMS SEEN    Culture   Final    NO GROWTH 3 DAYS Performed at [facility], [place] [address], [place], [place] [address]    Report Status 03/09/2021 FINAL  Final  MRSA Next Gen by PCR, Nasal     Status: None   Collection Time: 03/07/21  5:31 AM   Specimen: Nasal Mucosa; Nasal Swab  Result Value Ref Range Status   MRSA by PCR Next Gen NOT DETECTED NOT DETECTED Final    Comment: (NOTE) The GeneXpert MRSA Assay (FDA approved for NASAL specimens only), is one component of a comprehensive MRSA colonization surveillance program. It is not intended to diagnose  MRSA infection nor to guide or monitor treatment for MRSA infections. Test performance is not FDA approved in patients less than 46 years old. Performed at [facility], [address] [place], [place] [address]     Coagulation Studies: No results for input(s): LABPROT, INR in the last 72 hours.   Urinalysis: No results for input(s): COLORURINE, LABSPEC, PHURINE, GLUCOSEU, HGBUR, BILIRUBINUR, KETONESUR, PROTEINUR, UROBILINOGEN, NITRITE, LEUKOCYTESUR in the last 72 hours.  Invalid input(s): APPERANCEUR    Imaging: IR Catheter Tube Change  Result Date: 03/09/2021 CLINICAL DATA:  Inadvertent partial retraction of left-sided chest tube. Please perform fluoroscopic guided exchange, up sizing and repositioning. EXAM: IR CATHETER TUBE CHANGE COMPARISON:  CT-guided left-sided chest tube placement-03/08/2021 Chest radiograph-earlier same day CONTRAST:  None  MEDICATIONS: None. ANESTHESIA/SEDATION: None FLUOROSCOPY TIME:  18 seconds (4.2 mGy) TECHNIQUE: Patient was positioned supine, slightly RPO on the fluoroscopy table. The external portion of the existing left-sided chest tube as well as the surrounding skin was prepped and draped in usual sterile fashion. A preprocedural spot fluoroscopic image was obtained of the existing percutaneous drainage catheter. A small amount of contrast was injected via the existing percutaneous drainage catheter and several fluoroscopic images were obtained in various obliquities. The external portion of the chest tube was cut and cannulated with a short Amplatz wire. Under intermittent fluoroscopic guidance, the existing 14 French chest tube was exchanged for a new 38 French percutaneous drainage catheter with end coiled and locked within the more superior aspect of the left hemithorax. The new left-sided chest tube was connected to a pleura vac device and secured in place with 2 interrupted sutures and a StatLock device. A Vaseline gauze dressing was applied. The patient tolerated the procedure well without immediate postprocedural complication. FINDINGS: Preprocedural spot fluoroscopic image demonstrates partial retraction of recently placed left-sided chest tube. After fluoroscopic guided exchange, repositioning and up sizing, the new 16 French chest tube is more ideally positioned within the more cranial aspect of the left pleural space. IMPRESSION: Successful fluoroscopic guided exchange, up sizing and repositioning of now 16 French left-sided chest tube. PLAN: - Continued chest tube management as per the critical care team. Electronically Signed   By: Sandi Mariscal M.D.   On: 03/09/2021 17:00   PERIPHERAL VASCULAR CATHETERIZATION  Result Date: 03/09/2021 See surgical note for result.  DG Chest Port 1 View  Result Date: 03/10/2021 CLINICAL DATA:  Hypoxia. Recent chest tube placement for left-sided pneumothorax. EXAM:  PORTABLE CHEST 1 VIEW COMPARISON:  Radiographs 03/09/2021 and 03/07/2021.  CT 03/06/2021. FINDINGS: 0921 hours. The recently upsized left chest tube is better positioned, overlying the medial left chest. The loculated left hydropneumothorax appears slightly smaller. Underlying small bilateral pleural effusions and bilateral airspace opacities are unchanged. The heart size and mediastinal contours are stable. Right IJ central venous catheter projects to the superior cavoatrial junction. IMPRESSION: Improved position of small left-sided chest tube with slight improvement in loculated hydropneumothorax at the left costophrenic angle. No other significant changes. Electronically Signed   By: Richardean Sale M.D.   On: 03/10/2021 09:39   DG Chest Port 1 View  Result Date: 03/09/2021 CLINICAL DATA:  Pleural effusion EXAM: PORTABLE CHEST 1 VIEW COMPARISON:  Chest x-ray dated December 13th 2022 FINDINGS: Visualized cardiac and mediastinal contours are unchanged. Small left hydropneumothorax with chest tube in place. Small right pleural effusion. Right chest wall central venous catheter with tip positioned at the superior cavoatrial junction. Bilateral interstitial opacities, likely due to pulmonary edema. IMPRESSION: Small left  hydropneumothorax with chest tube in place. Electronically Signed   By: Yetta Glassman M.D.   On: 03/09/2021 13:58   ECHOCARDIOGRAM COMPLETE  Result Date: 03/09/2021    ECHOCARDIOGRAM REPORT   Patient Name:   Travis Long Date of Exam: 03/09/2021 Medical Rec #:  [id number]     Height:       69.0 in Accession #:    [id number]    Weight:       189.6 lb Date of Birth:  01/25/1986     BSA:          2.020 m Patient Age:    41 years      BP:           145/87 mmHg Patient Gender: M             HR:           78 bpm. Exam Location:  [facility] Procedure: 2D Echo, Cardiac Doppler, Color Doppler and Strain Analysis Indications:     CHF-acute diastolic Q25.95  History:         Patient has no prior history  of Echocardiogram examinations.                  Risk Factors:Hypertension. ESRD.  Sonographer:     Sherrie Sport Referring Phys:  Creola DEW Diagnosing Phys: Ida Rogue MD  Sonographer Comments: Global longitudinal strain was attempted. IMPRESSIONS  1. Left ventricular ejection fraction, by estimation, is 60 to 65%. The left ventricle has normal function. The left ventricle has no regional wall motion abnormalities. There is severe left ventricular hypertrophy. Left ventricular diastolic parameters  are consistent with Grade II diastolic dysfunction (pseudonormalization). The average left ventricular global longitudinal strain is -8.5 %. The global longitudinal strain is abnormal.  2. Right ventricular systolic function is normal. The right ventricular size is normal. Moderately increased right ventricular wall thickness.  3. Left atrial size was mildly dilated.  4. A small pericardial effusion is present.  5. The mitral valve is normal in structure. No evidence of mitral valve regurgitation. No evidence of mitral stenosis.  6. The aortic valve is normal in structure. Aortic valve regurgitation is not visualized. No aortic stenosis is present.  7. The inferior vena cava is normal in size with greater than 50% respiratory variability, suggesting right atrial pressure of 3 mmHg. FINDINGS  Left Ventricle: Left ventricular ejection fraction, by estimation, is 60 to 65%. The left ventricle has normal function. The left ventricle has no regional wall motion abnormalities. The average left ventricular global longitudinal strain is -8.5 %. The  global longitudinal strain is abnormal. The left ventricular internal cavity size was normal in size. There is severe left ventricular hypertrophy. Left ventricular diastolic parameters are consistent with Grade II diastolic dysfunction (pseudonormalization). Right Ventricle: The right ventricular size is normal. Moderately increased right ventricular wall thickness. Right  ventricular systolic function is normal. Left Atrium: Left atrial size was mildly dilated. Right Atrium: Right atrial size was normal in size. Pericardium: A small pericardial effusion is present. Mitral Valve: The mitral valve is normal in structure. No evidence of mitral valve regurgitation. No evidence of mitral valve stenosis. MV peak gradient, 8.1 mmHg. The mean mitral valve gradient is 5.0 mmHg. Tricuspid Valve: The tricuspid valve is normal in structure. Tricuspid valve regurgitation is mild . No evidence of tricuspid stenosis. Aortic Valve: The aortic valve is normal in structure. Aortic valve regurgitation is not visualized. No aortic stenosis is present. Aortic  valve mean gradient measures 4.0 mmHg. Aortic valve peak gradient measures 8.5 mmHg. Aortic valve area, by VTI measures 3.00 cm. Pulmonic Valve: The pulmonic valve was normal in structure. Pulmonic valve regurgitation is mild. No evidence of pulmonic stenosis. Aorta: The aortic root is normal in size and structure. Venous: The inferior vena cava is normal in size with greater than 50% respiratory variability, suggesting right atrial pressure of 3 mmHg. IAS/Shunts: No atrial level shunt detected by color flow Doppler.  LEFT VENTRICLE PLAX 2D LVIDd:         4.60 cm   Diastology LVIDs:         3.10 cm   LV e' medial:    6.20 cm/s LV PW:         1.80 cm   LV E/e' medial:  21.0 LV IVS:        1.20 cm   LV e' lateral:   4.79 cm/s LVOT diam:     2.00 cm   LV E/e' lateral: 27.1 LV SV:         68 LV SV Index:   33        2D Longitudinal Strain LVOT Area:     3.14 cm  2D Strain GLS Avg:     -8.5 %  RIGHT VENTRICLE RV Basal diam:  3.70 cm RV S prime:     16.20 cm/s TAPSE (M-mode): 4.2 cm LEFT ATRIUM             Index        RIGHT ATRIUM           Index LA diam:        4.10 cm 2.03 cm/m   RA Area:     17.30 cm LA Vol (A2C):   73.4 ml 36.34 ml/m  RA Volume:   53.60 ml  26.54 ml/m LA Vol (A4C):   66.7 ml 33.03 ml/m LA Biplane Vol: 75.5 ml 37.38 ml/m   AORTIC VALVE                    PULMONIC VALVE AV Area (Vmax):    2.73 cm     PV Vmax:          0.97 m/s AV Area (Vmean):   3.08 cm     PV Vmean:         73.600 cm/s AV Area (VTI):     3.00 cm     PV VTI:           0.231 m AV Vmax:           146.00 cm/s  PV Peak grad:     3.8 mmHg AV Vmean:          90.900 cm/s  PV Mean grad:     2.0 mmHg AV VTI:            0.225 m      PR End Diast Vel: 6.15 msec AV Peak Grad:      8.5 mmHg     RVOT Peak grad:   6 mmHg AV Mean Grad:      4.0 mmHg LVOT Vmax:         127.00 cm/s LVOT Vmean:        89.000 cm/s LVOT VTI:          0.215 m LVOT/AV VTI ratio: 0.96  AORTA Ao Root diam: 3.17 cm MITRAL VALVE  TRICUSPID VALVE MV Area (PHT): 3.65 cm     TR Peak grad:   15.1 mmHg MV Area VTI:   2.04 cm     TR Vmax:        194.00 cm/s MV Peak grad:  8.1 mmHg MV Mean grad:  5.0 mmHg     SHUNTS MV Vmax:       1.42 m/s     Systemic VTI:  0.22 m MV Vmean:      108.0 cm/s   Systemic Diam: 2.00 cm MV Decel Time: 208 msec     Pulmonic VTI:  0.236 m MV E velocity: 130.00 cm/s MV A velocity: 115.00 cm/s MV E/A ratio:  1.13 Ida Rogue MD Electronically signed by Ida Rogue MD Signature Date/Time: 03/09/2021/2:47:55 PM    Final      Medications:    sodium chloride 250 mL (03/10/21 1302)   ampicillin-sulbactam (UNASYN) IV 3 g (03/10/21 1313)   dexmedetomidine (PRECEDEX) IV infusion 1 mcg/kg/hr (03/10/21 1225)    sodium chloride   Intravenous Once   amLODipine  10 mg Oral Daily   calcium acetate  1,334 mg Oral TID WC   carvedilol  12.5 mg Oral BID WC   Chlorhexidine Gluconate Cloth  6 each Topical Q0600   hydrALAZINE  25 mg Oral Q8H   LORazepam  1 mg Intravenous Once   losartan  100 mg Oral Daily   minoxidil  5 mg Oral Daily   sodium chloride flush  3 mL Intravenous Q12H   sodium chloride, acetaminophen **OR** acetaminophen, HYDROcodone-acetaminophen, hydrOXYzine, lidocaine, LORazepam **OR** LORazepam, ondansetron **OR** ondansetron (ZOFRAN) IV  Assessment/  Plan:  Mr. Waldon Sheerin is a 35 y.o.  male with a past medical history significant for hypertension and end-stage renal disease on dialysis.  Patient presents to the emergency department from his outpatient dialysis clinic with complaints of worsening shortness of breath and elevated blood pressure.  He has been admitted for Shortness of breath [R06.02] Pleural effusion [J90] Pleural effusion, left [J90] Hypertensive urgency [I16.0]  [facility]/MWF/Rt Permcath-LUE AVF/ 82kg  End stage renal disease on dialysis: Will maintain outpatient scheduling,  Dialysis treatment scheduled for later today.  PermCath is being used for dialysis.  AV fistula is tortuous.  Will require additional surgical correction before it can be used.  2. Anemia of chronic kidney disease Lab Results  Component Value Date   HGB 7.2 (L) 03/09/2021  Hemoglobin slightly increased to 7.2. Will defer blood transfusion to primary team. Low dose EPO with treatment  3. Secondary Hyperparathyroidism:   Lab Results  Component Value Date   CALCIUM 8.3 (L) 03/09/2021   PHOS 3.8 03/08/2021    calcium and phosphorus within acceptable range  4.  Hypertension with chronic renal disease.  Home regimen includes amlodipine, carvedilol, losartan, and minoxidil.  Currently receiving amlodipine these medications along with IV labetalol. Blood pressure currently 156/82  5.  Community-acquired pneumonia, seen on chest CT.  Patient received thoracentesis which 750 mL of amber-colored pleural fluid removed on 03/06/21. Empirical antibiotic therapy, Unasyn.  Patient received CT guided chest tube 03/08/21 with tPA admin.  About 3000 cc of fluid has been removed. He continues to have extensive left-sided opacity with loculated effusions.  Chest tube placement has been complicated by hydropneumothorax.  VATS evaluation is recommended.    LOS: 4 Jamell Laymon 12/16/20221:17 PM

## 2021-03-10 NOTE — Progress Notes (Signed)
Became very vocal, and belligerent, states he does not want to die,  but he believes that he knows what is wrong with him and every hospital or doctor he has been to is lying to him. I tried to educate him regarding the empyema he has in his left lung, he stated that unless he could see it or the films he did not believe he had a real problem. Notified Keene NP regarding escalation of paranoia and belligerence, received one time order for 4 mg of ativan.

## 2021-03-10 NOTE — Progress Notes (Signed)
Asked by staff to visit with patient who was quite agitated. He was receptive of me and appeared to calm down for a period to allow staff to follow through with their treatment plans. I will continue to follow up.

## 2021-03-10 NOTE — Assessment & Plan Note (Signed)
Presented with shortness of breath, pleural effusions on chest x-ray in the setting of noncompliance with antihypertensives resulting in hypertensive urgency. Echo 12/15: EF 60-65%, G2DD Fistulogram 12/15: normal flow, no evidence of high output heart failure -- Volume removal by dialysis -- Pleural effusion management as below, per pulm -- Strict I/O's and daily weights --Vascular surgery consulted to assess fistula for high output heart failure

## 2021-03-10 NOTE — Significant Event (Signed)
Rapid Response Event Note   Reason for Call : called for pt reported AMS/hypoxia on med surg floor, with previously placed chest tube.   Initial Focused Assessment: patient sitting up in bed, belligerent with staff and doctors trying to help.      Interventions: Stat psych consult called, pt IVC'd and transferred to ICU with assistance from security.   Plan of Care: Dr Merrilee Jansky speaking with [name] about possible transfer for surgical procedure. Dr Merrilee Jansky also updated patients family. 1:1 sitter at bedside.    Event Summary: as above  MD Notified: brescia [id number] Call Celina [id number] End Time:1030  Gerlean Cid A, RN

## 2021-03-10 NOTE — Progress Notes (Signed)
Rapid response was called on patient for red mews. ICU MD in the room speaking with patient now. Patient's oxygen is 59%, HR is 122, and respirations 35. Patient still refusing oxygen, telemetry, assessment, and medications. Patient talking in circles, yelling,  and not comprehending need for care. Patient refusing to go to ICU. Said "I feel great. I am not critical care. I will not be kidnapped." Patient is very paranoid. Psych coming to see patient and security called.

## 2021-03-10 NOTE — Assessment & Plan Note (Addendum)
Hemoglobin improved to 7.2. Pt frequently refuses labs.  No evidence of bleeding. -- Monitor CBC's -- Started on EPO with dialysis per nephrology

## 2021-03-10 NOTE — Progress Notes (Signed)
Upon first meeting, assessment Travis Long profusely apologized for his earlier behavior, stating that he does not want anyone to be upset with him or to get banned from this hospital, states he has been banned from other hospitals and he does not want that to happen here. I assured Travis Long that we do not take stress responses personally and that we will continue to care for him.

## 2021-03-10 NOTE — Discharge Summary (Signed)
Physician Discharge Summary   Patient name: Travis Long  Admit date:     03/06/2021  Discharge date: 03/10/2021  Discharge Physician: Ezekiel Slocumb   PCP: Pcp, No   Recommendations at discharge:   Follow up recommendations per discharging service at [facility]  Discharge Diagnoses Principal Problem:   Acute on chronic diastolic CHF (congestive heart failure) ([place]) Active Problems:   Acute respiratory failure with hypoxia ([place])   CAP (community acquired pneumonia)   Stress reaction causing mixed disturbance of emotion and conduct   Loculated pleural effusion   Hypertensive urgency   SOB (shortness of breath)   ESRD on hemodialysis ([place])   Anemia in ESRD (end-stage renal disease) ([place])   Tobacco abuse   Medically noncompliant   Resolved Diagnoses N/A  Hospital Course   Travis Long is a 35 y.o. male with a PMH significant for HTN, ESRD on HD MWF who presented from home to the ED on 03/06/2021 with shortness of breath x 14 days. In the ED, it was found that they had not been adherent with blood pressure medications or dialysis as he missed 1 session last week. They were treated with scheduled dialysis and IV blood pressure medication.  Chest x-ray showed left-sided large pleural effusion with atelectasis.  Thoracentesis was performed.    Pulmonology and nephrology consulted. Patient was admitted to medicine service for further workup and management of shortness of breath as outlined in detail below.   12/16 - patient IVC'd by psych, transferred to ICU for closer monitoring. Needs CT surgery evaluation for consideration of VATS. Transfer to [facility].   * Acute on chronic diastolic CHF (congestive heart failure) ([place]) Presented with shortness of breath, pleural effusions on chest x-ray in the setting of noncompliance with antihypertensives resulting in hypertensive urgency. Echo 12/15: EF 60-65%, G2DD Fistulogram 12/15: normal flow, no evidence of high output heart  failure -- Volume removal by dialysis -- Pleural effusion management as below, per pulm -- Strict I/O's and daily weights --Vascular surgery consulted to assess fistula for high output heart failure  Stress reaction causing mixed disturbance of emotion and conduct Psychiatry consulted, input appreciated. See Psych recommendations.  CAP (community acquired pneumonia) Continue empiric antibiotics ([place]). Pleural effusion management as below per pulmonology.  Acute respiratory failure with hypoxia (H[facility]) POA, due to volume overload and pleural effusion. 12/16 - pt refusing to wear nasal cannula, O2 sat dropped to upper 50's on room air. --Supplement O2, maintain sat > 90%  SOB (shortness of breath) Appears due to volume overload in the setting of renal failure and CHF, pleural effusions. Pneumonia may also be contributing. Management as outlined.  Hypertensive urgency Present on admission, due to noncompliance with antihypertensives.  Home regimen includes amlodipine, Coreg, losartan, minoxidil. BP improved with dialysis, but remains elevated.  Pt refuses meds at times. -- Continue antihypertensives -- Increased hydralazine 10 >> 25 mg TID -- Monitor BP closely  Loculated pleural effusion Status postthoracentesis on 03/06/2021 with 750 cc fluid removed.  Fluid studies consistent with mixed exudative and transudative processes.  Suspect due to ESRD, uncontrolled hypertension and cardiomyopathy. 12/14 - chest tube placed, 1st TPA  12/15 - 2nd TPA, chest tube dislodged & replaced 12/16 - lung contours unchanged despite TPA, appears fibrin-trapped  --Pulmonology managing. --Continue empiric antibiotics as outlined --Transfer to [name] for [facility] surgery evaluation, potential VATS    ESRD on hemodialysis ([place]) Hemodialysis MWF. Nephrology following. --Dialysis per nephro  Anemia in ESRD (end-stage renal disease) ([place]) Hemoglobin  improved to 7.2. Pt frequently refuses labs.   No evidence of bleeding. -- Monitor CBC's -- Started on EPO with dialysis per nephrology  Tobacco abuse Counseled extensively on the importance of cessation  Medically noncompliant Presented with hypertensive urgency, not taking antihypertensives.  Intermittently declines recommended and routine care during hospital admission.  Declined multiple lab draws to monitor anemia and determine need for possible blood transfusion.      Procedures performed:  Dialysis  Thoracentesis Chest tube placement Chest tube replacement after dislodging  Condition at discharge: stable  Exam - caveat: patient refused phyical exam, limited to only observed General exam: awake, alert, no acute distress HEENT: atraumatic, clear conjunctiva, anicteric sclera, hearing grossly normal  Respiratory system: normal respiratory effort, on room air, spO2 59%. Cardiovascular system: unable to examine, pt refuses. Gastrointestinal system: unable to examine, pt refuses. Central nervous system: A&O x3. no gross focal neurologic deficits, normal speech Skin: appears dry, intact Psychiatry: agitated and belligerent mood, congruent affect, judgement and insight abnormal, pressured speech   Disposition:  Transfer to [name] for [facility] surgery evaluation  Discharge time: greater than 30 minutes.  Follow-up Information     Travis Alu, MD. Go on 06/22/2021.   Specialty: Family Medicine Why: Please arrive at 250 pm Contact information: [place] [address] [phone]                 Allergies as of 03/10/2021   No Known Allergies      Medication List     TAKE these medications    acetaminophen 325 MG tablet Commonly known as: TYLENOL Take 2 tablets (650 mg total) by mouth every 6 (six) hours as needed for mild pain (or Fever >/= 101).   amLODipine 10 MG tablet Commonly known as: NORVASC Take 1 tablet (10 mg total) by mouth daily.    Ampicillin-Sulbactam 3 g in sodium chloride 0.9 % 100 mL Inject 3 g into the vein every 12 (twelve) hours.   calcium acetate 667 MG capsule Commonly known as: PHOSLO Take 2 capsules (1,334 mg total) by mouth 3 (three) times daily with meals. What changed: when to take this   carvedilol 12.5 MG tablet Commonly known as: COREG Take 1 tablet (12.5 mg total) by mouth 2 (two) times daily with a meal. What changed:  medication strength how much to take when to take this   Chlorhexidine Gluconate Cloth 2 % Pads Apply 6 each topically daily at 6 (six) AM. Start taking on: March 11, 2021   dexmedetomidine 400 MCG/100ML Soln Commonly known as: PRECEDEX Inject 34.4-103.2 mcg/hr into the vein continuous.   epoetin alfa 10000 UNIT/ML injection Commonly known as: EPOGEN Inject 0.4 mLs (4,000 Units total) into the vein every Monday, Wednesday, and Friday with hemodialysis. Start taking on: March 13, 2021   furosemide 80 MG tablet Commonly known as: LASIX Take 80 mg by mouth daily.   hydrALAZINE 25 MG tablet Commonly known as: APRESOLINE Take 1 tablet (25 mg total) by mouth every 8 (eight) hours.   HYDROcodone-acetaminophen 5-325 MG tablet Commonly known as: NORCO/VICODIN Take 1-2 tablets by mouth every 4 (four) hours as needed for moderate pain.   hydrOXYzine 10 MG tablet Commonly known as: ATARAX Take 1 tablet (10 mg total) by mouth 3 (three) times daily as needed for anxiety.   LORazepam 0.5 MG tablet Commonly known as: ATIVAN Take 1 tablet (0.5 mg total) by mouth once as needed (refractory anxiety).   LORazepam 2 MG/ML  injection Commonly known as: ATIVAN Inject 0.5 mLs (1 mg total) into the vein once for 1 dose.   losartan 100 MG tablet Commonly known as: COZAAR Take 1 tablet (100 mg total) by mouth daily.   minoxidil 2.5 MG tablet Commonly known as: LONITEN Take 2 tablets (5 mg total) by mouth daily.   ondansetron 4 MG tablet Commonly known as: ZOFRAN Take  1 tablet (4 mg total) by mouth every 6 (six) hours as needed for nausea.   sodium chloride 0.9 % infusion Inject 10 mLs into the vein once for 1 dose.   sodium chloride 0.9 % infusion Inject 250 mLs into the vein as needed (for IV line care  (Saline / Heparin Lock)).   sodium chloride flush 0.9 % Soln Commonly known as: NS Inject 3 mLs into the vein every 12 (twelve) hours.        CT CHEST WO CONTRAST  Result Date: 03/06/2021 CLINICAL DATA:  Pneumonia, left pleural effusion and status post left thoracentesis with removal of 750 mL of fluid. Post thoracentesis chest x-ray shows a small amount new air in the pleural space towards the apex. EXAM: CT CHEST WITHOUT CONTRAST TECHNIQUE: Multidetector CT imaging of the chest was performed following the standard protocol without IV contrast. COMPARISON:  Chest x-ray earlier today. FINDINGS: Cardiovascular: The heart size is within normal limits. No pericardial fluid identified. The thoracic aorta and central pulmonary arteries are normal in caliber. A tunneled dialysis catheter is present inserted via the lower right internal jugular vein and extending to the SVC/RA junction. Mediastinum/Nodes: Multiple scattered mediastinal lymph nodes are visualized. The largest in the right lower paratracheal region measure approximately 12 mm in maximum short axis diameter. Lymph nodes are most likely reactive given acute pulmonary findings. The thyroid gland appears unremarkable. Lungs/Pleura: There is consolidation of the left lower lobe likely consistent with acute pneumonia. Associated loculated parapneumonic pleural fluid on the left may be consistent with empyema. A small amount of air is present at the apex posteriorly in the pleural space likely introduced at the time of the thoracentesis procedure. This does not represent significant pneumothorax. There is a small right pleural effusion. Potential component of subtle pneumonia at the right lung base. Upper  Abdomen: No acute abnormality. Musculoskeletal: No chest wall mass or suspicious bone lesions identified. IMPRESSION: 1. Left lower lobe consolidation consistent with acute pneumonia. Associated loculated parapneumonic pleural fluid may be consistent with developing empyema. A small amount of pleural air at the apex may have been introduced at the time of the thoracentesis and does not represent a significant pneumothorax. 2. Small right pleural effusion with possible component of subtle pneumonia at the right lung base. 3. Multiple scattered small mediastinal lymph nodes which may be reactive given pulmonary findings. Electronically Signed   By: Aletta Edouard M.D.   On: 03/06/2021 10:02   IR Catheter Tube Change  Result Date: 03/09/2021 CLINICAL DATA:  Inadvertent partial retraction of left-sided chest tube. Please perform fluoroscopic guided exchange, up sizing and repositioning. EXAM: IR CATHETER TUBE CHANGE COMPARISON:  CT-guided left-sided chest tube placement-03/08/2021 Chest radiograph-earlier same day CONTRAST:  None MEDICATIONS: None. ANESTHESIA/SEDATION: None FLUOROSCOPY TIME:  18 seconds (4.2 mGy) TECHNIQUE: Patient was positioned supine, slightly RPO on the fluoroscopy table. The external portion of the existing left-sided chest tube as well as the surrounding skin was prepped and draped in usual sterile fashion. A preprocedural spot fluoroscopic image was obtained of the existing percutaneous drainage catheter. A small amount of contrast  was injected via the existing percutaneous drainage catheter and several fluoroscopic images were obtained in various obliquities. The external portion of the chest tube was cut and cannulated with a short Amplatz wire. Under intermittent fluoroscopic guidance, the existing 14 French chest tube was exchanged for a new 76 French percutaneous drainage catheter with end coiled and locked within the more superior aspect of the left hemithorax. The new left-sided  chest tube was connected to a pleura vac device and secured in place with 2 interrupted sutures and a StatLock device. A Vaseline gauze dressing was applied. The patient tolerated the procedure well without immediate postprocedural complication. FINDINGS: Preprocedural spot fluoroscopic image demonstrates partial retraction of recently placed left-sided chest tube. After fluoroscopic guided exchange, repositioning and up sizing, the new 16 French chest tube is more ideally positioned within the more cranial aspect of the left pleural space. IMPRESSION: Successful fluoroscopic guided exchange, up sizing and repositioning of now 16 French left-sided chest tube. PLAN: - Continued chest tube management as per the critical care team. Electronically Signed   By: Sandi Mariscal M.D.   On: 03/09/2021 17:00   PERIPHERAL VASCULAR CATHETERIZATION  Result Date: 03/09/2021 See surgical note for result.  DG Chest Port 1 View  Result Date: 03/10/2021 CLINICAL DATA:  Hypoxia. Recent chest tube placement for left-sided pneumothorax. EXAM: PORTABLE CHEST 1 VIEW COMPARISON:  Radiographs 03/09/2021 and 03/07/2021.  CT 03/06/2021. FINDINGS: 0921 hours. The recently upsized left chest tube is better positioned, overlying the medial left chest. The loculated left hydropneumothorax appears slightly smaller. Underlying small bilateral pleural effusions and bilateral airspace opacities are unchanged. The heart size and mediastinal contours are stable. Right IJ central venous catheter projects to the superior cavoatrial junction. IMPRESSION: Improved position of small left-sided chest tube with slight improvement in loculated hydropneumothorax at the left costophrenic angle. No other significant changes. Electronically Signed   By: Richardean Sale M.D.   On: 03/10/2021 09:39   DG Chest Port 1 View  Result Date: 03/09/2021 CLINICAL DATA:  Pleural effusion EXAM: PORTABLE CHEST 1 VIEW COMPARISON:  Chest x-ray dated December 13th 2022  FINDINGS: Visualized cardiac and mediastinal contours are unchanged. Small left hydropneumothorax with chest tube in place. Small right pleural effusion. Right chest wall central venous catheter with tip positioned at the superior cavoatrial junction. Bilateral interstitial opacities, likely due to pulmonary edema. IMPRESSION: Small left hydropneumothorax with chest tube in place. Electronically Signed   By: Yetta Glassman M.D.   On: 03/09/2021 13:58   DG Chest Port 1 View  Result Date: 03/07/2021 CLINICAL DATA:  Lucas's/pneumonia EXAM: PORTABLE CHEST 1 VIEW COMPARISON:  Previous studies including the examination of 03/06/2022 FINDINGS: Transverse diameter of heart is increased. Moderate to large left pleural effusion is present. There is small pocket of air in the left apical region with possible slight decrease. There is no evidence of right pneumothorax. Central pulmonary vessels are prominent. There is prominence of interstitial markings in the right parahilar region and right lower lung fields, possibly suggesting interstitial edema. Evaluation of left lung for infiltrates or pulmonary edema is limited due to overlying large left pleural effusion. Tip of dialysis catheter is seen in the right atrium close to its junction with superior vena cava. IMPRESSION: Moderate to large partly loculated left pleural effusion with no significant interval change. Small right pleural effusion. There are linear densities in the left mid and both lower lung fields suggesting atelectasis/pneumonia. Cardiomegaly. Central pulmonary vessels are prominent suggesting CHF. Electronically Signed   By:  Elmer Picker M.D.   On: 03/07/2021 13:42   DG Chest Port 1 View  Result Date: 03/06/2021 CLINICAL DATA:  Post thoracentesis on the left. EXAM: PORTABLE CHEST 1 VIEW COMPARISON:  Same day chest radiograph. FINDINGS: No substantial change in size of a left pleural effusion, which is likely loculated. New small area of gas  within the superior aspect of the left pleural effusion with air-fluid level. similar small right pleural effusion. Similar left lung opacification. Cardiac silhouette is largely obscured. Similar positioning of a right IJ dialysis catheter. IMPRESSION: 1. No substantial change in size of a left pleural effusion, which is likely loculated. New small area of gas within the superior aspect of the left pleural effusion with air-fluid level, probably introduced during interval thoracentesis. Recommend follow-up chest radiograph two ensure stability/resolution and exclude developing pneumothorax. 2. Similar left lung opacification, probably compressive atelectasis. Pneumonia is not excluded. 3. Similar small right pleural effusion. Findings discussed with the patient's nurse [place] who will inform the physician. Electronically Signed   By: Margaretha Sheffield M.D.   On: 03/06/2021 09:15   DG Chest Portable 1 View  Result Date: 03/06/2021 CLINICAL DATA:  Chest pain EXAM: PORTABLE CHEST 1 VIEW COMPARISON:  12/01/2020 FINDINGS: Increased opacification of the left hemithorax likely due to large pleural effusion and associated atelectasis. Small right pleural effusion is also present with adjacent atelectasis. Cardiomediastinal contours are obscured. Right dialysis catheter is present. IMPRESSION: Increased opacification of left hemithorax likely due to large pleural effusion and associated atelectasis. There is also a small right pleural effusion with adjacent atelectasis. Electronically Signed   By: Macy Mis M.D.   On: 03/06/2021 07:54   ECHOCARDIOGRAM COMPLETE  Result Date: 03/09/2021    ECHOCARDIOGRAM REPORT   Patient Name:   JAHMIL MACLEOD Date of Exam: 03/09/2021 Medical Rec #:  [id number]     Height:       69.0 in Accession #:    [id number]    Weight:       189.6 lb Date of Birth:  22-Nov-1985     BSA:          2.020 m Patient Age:    89 years      BP:           145/87 mmHg Patient Gender: M             HR:            78 bpm. Exam Location:  [facility] Procedure: 2D Echo, Cardiac Doppler, Color Doppler and Strain Analysis Indications:     CHF-acute diastolic C94.70  History:         Patient has no prior history of Echocardiogram examinations.                  Risk Factors:Hypertension. ESRD.  Sonographer:     Sherrie Sport Referring Phys:  [place] DEW Diagnosing Phys: Ida Rogue MD  Sonographer Comments: Global longitudinal strain was attempted. IMPRESSIONS  1. Left ventricular ejection fraction, by estimation, is 60 to 65%. The left ventricle has normal function. The left ventricle has no regional wall motion abnormalities. There is severe left ventricular hypertrophy. Left ventricular diastolic parameters  are consistent with Grade II diastolic dysfunction (pseudonormalization). The average left ventricular global longitudinal strain is -8.5 %. The global longitudinal strain is abnormal.  2. Right ventricular systolic function is normal. The right ventricular size is normal. Moderately increased right ventricular wall thickness.  3. Left atrial size was mildly  dilated.  4. A small pericardial effusion is present.  5. The mitral valve is normal in structure. No evidence of mitral valve regurgitation. No evidence of mitral stenosis.  6. The aortic valve is normal in structure. Aortic valve regurgitation is not visualized. No aortic stenosis is present.  7. The inferior vena cava is normal in size with greater than 50% respiratory variability, suggesting right atrial pressure of 3 mmHg. FINDINGS  Left Ventricle: Left ventricular ejection fraction, by estimation, is 60 to 65%. The left ventricle has normal function. The left ventricle has no regional wall motion abnormalities. The average left ventricular global longitudinal strain is -8.5 %. The  global longitudinal strain is abnormal. The left ventricular internal cavity size was normal in size. There is severe left ventricular hypertrophy. Left ventricular diastolic  parameters are consistent with Grade II diastolic dysfunction (pseudonormalization). Right Ventricle: The right ventricular size is normal. Moderately increased right ventricular wall thickness. Right ventricular systolic function is normal. Left Atrium: Left atrial size was mildly dilated. Right Atrium: Right atrial size was normal in size. Pericardium: A small pericardial effusion is present. Mitral Valve: The mitral valve is normal in structure. No evidence of mitral valve regurgitation. No evidence of mitral valve stenosis. MV peak gradient, 8.1 mmHg. The mean mitral valve gradient is 5.0 mmHg. Tricuspid Valve: The tricuspid valve is normal in structure. Tricuspid valve regurgitation is mild . No evidence of tricuspid stenosis. Aortic Valve: The aortic valve is normal in structure. Aortic valve regurgitation is not visualized. No aortic stenosis is present. Aortic valve mean gradient measures 4.0 mmHg. Aortic valve peak gradient measures 8.5 mmHg. Aortic valve area, by VTI measures 3.00 cm. Pulmonic Valve: The pulmonic valve was normal in structure. Pulmonic valve regurgitation is mild. No evidence of pulmonic stenosis. Aorta: The aortic root is normal in size and structure. Venous: The inferior vena cava is normal in size with greater than 50% respiratory variability, suggesting right atrial pressure of 3 mmHg. IAS/Shunts: No atrial level shunt detected by color flow Doppler.  LEFT VENTRICLE PLAX 2D LVIDd:         4.60 cm   Diastology LVIDs:         3.10 cm   LV e' medial:    6.20 cm/s LV PW:         1.80 cm   LV E/e' medial:  21.0 LV IVS:        1.20 cm   LV e' lateral:   4.79 cm/s LVOT diam:     2.00 cm   LV E/e' lateral: 27.1 LV SV:         68 LV SV Index:   33        2D Longitudinal Strain LVOT Area:     3.14 cm  2D Strain GLS Avg:     -8.5 %  RIGHT VENTRICLE RV Basal diam:  3.70 cm RV S prime:     16.20 cm/s TAPSE (M-mode): 4.2 cm LEFT ATRIUM             Index        RIGHT ATRIUM           Index LA diam:         4.10 cm 2.03 cm/m   RA Area:     17.30 cm LA Vol (A2C):   73.4 ml 36.34 ml/m  RA Volume:   53.60 ml  26.54 ml/m LA Vol (A4C):   66.7 ml 33.03 ml/m LA Biplane Vol: 75.5  ml 37.38 ml/m  AORTIC VALVE                    PULMONIC VALVE AV Area (Vmax):    2.73 cm     PV Vmax:          0.97 m/s AV Area (Vmean):   3.08 cm     PV Vmean:         73.600 cm/s AV Area (VTI):     3.00 cm     PV VTI:           0.231 m AV Vmax:           146.00 cm/s  PV Peak grad:     3.8 mmHg AV Vmean:          90.900 cm/s  PV Mean grad:     2.0 mmHg AV VTI:            0.225 m      PR End Diast Vel: 6.15 msec AV Peak Grad:      8.5 mmHg     RVOT Peak grad:   6 mmHg AV Mean Grad:      4.0 mmHg LVOT Vmax:         127.00 cm/s LVOT Vmean:        89.000 cm/s LVOT VTI:          0.215 m LVOT/AV VTI ratio: 0.96  AORTA Ao Root diam: 3.17 cm MITRAL VALVE                TRICUSPID VALVE MV Area (PHT): 3.65 cm     TR Peak grad:   15.1 mmHg MV Area VTI:   2.04 cm     TR Vmax:        194.00 cm/s MV Peak grad:  8.1 mmHg MV Mean grad:  5.0 mmHg     SHUNTS MV Vmax:       1.42 m/s     Systemic VTI:  0.22 m MV Vmean:      108.0 cm/s   Systemic Diam: 2.00 cm MV Decel Time: 208 msec     Pulmonic VTI:  0.236 m MV E velocity: 130.00 cm/s MV A velocity: 115.00 cm/s MV E/A ratio:  1.13 Ida Rogue MD Electronically signed by Ida Rogue MD Signature Date/Time: 03/09/2021/2:47:55 PM    Final    US THORACENTESIS ASP PLEURAL SPACE W/IMG GUIDE  Result Date: 03/06/2021 INDICATION: Patient with shortness of breath and left pleural effusion request received for thoracentesis. EXAM: ULTRASOUND GUIDED LEFT THORACENTESIS MEDICATIONS: Local 1% lidocaine only. COMPLICATIONS: None immediate. PROCEDURE: An ultrasound guided thoracentesis was thoroughly discussed with the patient and questions answered. The benefits, risks, alternatives and complications were also discussed. The patient understands and wishes to proceed with the procedure. Written consent was  obtained. Ultrasound was performed to localize and mark an adequate pocket of fluid in the left chest. The area was then prepped and draped in the normal sterile fashion. 1% Lidocaine was used for local anesthesia. Under ultrasound guidance a 19 gauge, 7-cm, Yueh catheter was introduced. Thoracentesis was performed. The catheter was removed and a dressing applied. Procedure was stopped early secondary to chest pain with remaining small left pleural effusion seen postprocedure. FINDINGS: A total of approximately 750 mL of amber colored fluid was removed. Samples were sent to the laboratory as requested by the clinical team. IMPRESSION: Successful ultrasound guided left thoracentesis yielding 750 mL of pleural fluid. This exam was performed  by Tsosie Billing PA-C, and was supervised and interpreted by Dr. Kathlene Cote. Electronically Signed   By: Aletta Edouard M.D.   On: 03/06/2021 09:39   Results for orders placed or performed during the hospital encounter of 03/06/21  Resp Panel by RT-PCR (Flu A&B, Covid) Nasopharyngeal Swab     Status: None   Collection Time: 03/06/21  6:45 AM   Specimen: Nasopharyngeal Swab; Nasopharyngeal(NP) swabs in vial transport medium  Result Value Ref Range Status   SARS Coronavirus 2 by RT PCR NEGATIVE NEGATIVE Final    Comment: (NOTE) SARS-CoV-2 target nucleic acids are NOT DETECTED.  The SARS-CoV-2 RNA is generally detectable in upper respiratory specimens during the acute phase of infection. The lowest concentration of SARS-CoV-2 viral copies this assay can detect is 138 copies/mL. A negative result does not preclude SARS-Cov-2 infection and should not be used as the sole basis for treatment or other patient management decisions. A negative result may occur with  improper specimen collection/handling, submission of specimen other than nasopharyngeal swab, presence of viral mutation(s) within the areas targeted by this assay, and inadequate number of viral copies(<138  copies/mL). A negative result must be combined with clinical observations, patient history, and epidemiological information. The expected result is Negative.  Fact Sheet for Patients:  EntrepreneurPulse.com.au  Fact Sheet for Healthcare Providers:  IncredibleEmployment.be  This test is no t yet approved or cleared by the [place] FDA and  has been authorized for detection and/or diagnosis of SARS-CoV-2 by FDA under an Emergency Use Authorization (EUA). This EUA will remain  in effect (meaning this test can be used) for the duration of the COVID-19 declaration under Section 564(b)(1) of the Act, 21 U.S.C.section 360bbb-3(b)(1), unless the authorization is terminated  or revoked sooner.       Influenza A by PCR NEGATIVE NEGATIVE Final   Influenza B by PCR NEGATIVE NEGATIVE Final    Comment: (NOTE) The Xpert Xpress SARS-CoV-2/FLU/RSV plus assay is intended as an aid in the diagnosis of influenza from Nasopharyngeal swab specimens and should not be used as a sole basis for treatment. Nasal washings and aspirates are unacceptable for Xpert Xpress SARS-CoV-2/FLU/RSV testing.  Fact Sheet for Patients: EntrepreneurPulse.com.au  Fact Sheet for Healthcare Providers: IncredibleEmployment.be  This test is not yet approved or cleared by the [place] FDA and has been authorized for detection and/or diagnosis of SARS-CoV-2 by FDA under an Emergency Use Authorization (EUA). This EUA will remain in effect (meaning this test can be used) for the duration of the COVID-19 declaration under Section 564(b)(1) of the Act, 21 U.S.C. section 360bbb-3(b)(1), unless the authorization is terminated or revoked.  Performed at [facility], [address] [place], [place] [address]   Body fluid culture w Gram Stain     Status: None   Collection Time: 03/06/21  8:40 AM   Specimen: PATH Cytology Pleural fluid   Result Value Ref Range Status   Specimen Description   Final    PLEURAL Performed at [facility], [address] [place], [place] [address]    Special Requests   Final    NONE Performed at [facility], [address] [place], [place] [address]    Gram Stain   Final    RARE WBC PRESENT, PREDOMINANTLY MONONUCLEAR NO ORGANISMS SEEN    Culture   Final    NO GROWTH 3 DAYS Performed at [facility], [place] [address], [place], [place] [address]    Report Status 03/09/2021 FINAL  Final  MRSA Next Gen by PCR, Nasal     Status: None   Collection Time: 03/07/21  5:31 AM   Specimen: Nasal Mucosa; Nasal Swab  Result Value Ref Range Status   MRSA by PCR Next Gen NOT DETECTED NOT DETECTED Final    Comment: (NOTE) The GeneXpert MRSA Assay (FDA approved for NASAL specimens only), is one component of a comprehensive MRSA colonization surveillance program. It is not intended to diagnose MRSA infection nor to guide or monitor treatment for MRSA infections. Test performance is not FDA approved in patients less than 50 years old. Performed at [facility], [address] [place], [place] [address]     Signed:  Ezekiel Slocumb MD.  [facility] 03/10/2021, 2:04 PM

## 2021-03-10 NOTE — Assessment & Plan Note (Signed)
POA, due to volume overload and pleural effusion. 12/16 - pt refusing to wear nasal cannula, O2 sat dropped to upper 50's on room air. --Supplement O2, maintain sat > 90%

## 2021-03-10 NOTE — Consult Note (Signed)
[facility]iat[facility] Consult   Reason for Consult:  noncompliance of medical treatment Referring Physician:  Dr Arbutus Ped Patient Identification: Travis Long MRN:  [id number] Principal Diagnosis: Acute on chronic diastolic CHF (congestive heart failure) ([place])  Stress reaction causing mixed disturbance of emotion and conduct Diagnosis:  Principal Problem:   Acute on chronic diastolic CHF (congestive heart failure) ([place]) Active Problems:   Stress reaction causing mixed disturbance of emotion and conduct   ESRD on hemodialysis ([place])   Anemia in ESRD (end-stage renal disease) ([facility])   Tobacco abuse   Loculated pleural effusion   Hypertensive urgency   CAP (community acquired pneumonia)   SOB (shortness of breath)   Medically noncompliant   Total Time spent with patient: 1 hour  Subjective:   Travis Long is a 35 y.o. male patient admitted with CHF.  HPI:  35 yo male with ESRD and CHF, consult for noncompliance with oxygen.  On assessment, the client is frustrated with everyone coming into his room telling him what to do.  He did place his oxygen in place independently even though he does not believe the negative labs as he does not feel bad.  On one-one assessment, he voices that he has consented to surgeries and frustrated that things have changed.  He showed pictures on his phone of the fluid taken out with a thoracentesis versus the chest tube which "was supposed to have poured out of me and it doesn't, look at it.  It's not pouring.  Now they want to do something else.  Why do they tell me one thing and then another.  I fell forced."  Validated his feelings and frustration.  He also does not understand why he was denied a kidney.  This appears to be a build up of frustration and loss of independence with a young adult.  It is recommended that choices be provided whenever possible for the client to regain some control in his life.  For instance, ask him when he would like his bath  versus doing it, taking time to allow him to vent some frustrations so he feels heard.  Validate his feelings.  No suicidal/homicidal ideations, psychosis, or past mental health issues.  IVC placed as it is feared he may try and leave the hospital in denial of the severity of his illness and high risk for death.    Past Psychiatric History: none  Risk to Self:  only if he refuses treatment Risk to Others:  none Prior Inpatient Therapy:  none Prior Outpatient Therapy:  none  Past Medical History:  Past Medical History:  Diagnosis Date   ESRD on hemodialysis ([place])    Hypertension     Past Surgical History:  Procedure Laterality Date   A/V FISTULAGRAM Left 03/09/2021   Procedure: A/V Fistulagram;  Surgeon: Algernon Huxley, MD;  Location: [facility];  Service: Cardiovascular;  Laterality: Left;   AV FISTULA PLACEMENT Left    IR CATHETER TUBE CHANGE  03/09/2021   Family History:  Family History  Problem Relation Age of Onset   Hypertension Mother    Diabetes Mellitus II Mother    Family Psychiatric  History: none Social History:  Social History   Substance and Sexual Activity  Alcohol Use Not Currently     Social History   Substance and Sexual Activity  Drug Use Never    Social History   Socioeconomic History   Marital status: Single    Spouse name: Not on file  Number of children: Not on file   Years of education: Not on file   Highest education level: Not on file  Occupational History   Not on file  Tobacco Use   Smoking status: Former    Types: Cigarettes   Smokeless tobacco: Never  Substance and Sexual Activity   Alcohol use: Not Currently   Drug use: Never   Sexual activity: Not on file  Other Topics Concern   Not on file  Social History Narrative   Not on file   Social Determinants of Health   Financial Resource Strain: Not on file  Food Insecurity: Not on file  Transportation Needs: Not on file  Physical Activity: Not on file  Stress:  Not on file  Social Connections: Not on file   Additional Social History:    Allergies:  No Known Allergies  Labs:  Results for orders placed or performed during the hospital encounter of 03/06/21 (from the past 48 hour(s))  Basic metabolic panel     Status: Abnormal   Collection Time: 03/09/21  1:19 PM  Result Value Ref Range   Sodium 135 135 - 145 mmol/L   Potassium 4.7 3.5 - 5.1 mmol/L   Chloride 93 (L) 98 - 111 mmol/L   CO2 31 22 - 32 mmol/L   Glucose, Bld 127 (H) 70 - 99 mg/dL    Comment: Glucose reference range applies only to samples taken after fasting for at least 8 hours.   BUN 49 (H) 6 - 20 mg/dL   Creatinine, Ser 12.44 (H) 0.61 - 1.24 mg/dL   Calcium 8.3 (L) 8.9 - 10.3 mg/dL   GFR, Estimated 5 (L) >60 mL/min    Comment: (NOTE) Calculated using the CKD-EPI Creatinine Equation (2021)    Anion gap 11 5 - 15    Comment: Performed at [facility], [address] [place], [place] [address]  CBC     Status: Abnormal   Collection Time: 03/09/21  1:19 PM  Result Value Ref Range   WBC 10.5 4.0 - 10.5 K/uL   RBC 2.40 (L) 4.22 - 5.81 MIL/uL   Hemoglobin 7.2 (L) 13.0 - 17.0 g/dL   HCT 22.0 (L) 39.0 - 52.0 %   MCV 91.7 80.0 - 100.0 fL   MCH 30.0 26.0 - 34.0 pg   MCHC 32.7 30.0 - 36.0 g/dL   RDW 14.7 11.5 - 15.5 %   Platelets 230 150 - 400 K/uL   nRBC 0.0 0.0 - 0.2 %    Comment: Performed at [facility], [address] [place], [place] [address]    Current Facility-Administered Medications  Medication Dose Route Frequency Provider Last Rate Last Admin   0.9 %  sodium chloride infusion (Manually program via Guardrails IV Fluids)   Intravenous Once Algernon Huxley, MD       0.9 %  sodium chloride infusion  250 mL Intravenous PRN Algernon Huxley, MD 10 mL/hr at 03/09/21 2229 250 mL at 03/09/21 2229   acetaminophen (TYLENOL) tablet 650 mg  650 mg Oral Q6H PRN Algernon Huxley, MD       Or   acetaminophen (TYLENOL) suppository 650 mg  650 mg Rectal Q6H PRN Algernon Huxley, MD       amLODipine (NORVASC) tablet 10 mg  10 mg Oral Daily Algernon Huxley, MD   10 mg at 03/09/21 0913   Ampicillin-Sulbactam (UNASYN) 3 g in sodium chloride 0.9 % 100 mL IVPB  3 g Intravenous  Q12H Algernon Huxley, MD 200 mL/hr at 03/09/21 2229 3 g at 03/09/21 2229   calcium acetate (PHOSLO) capsule 1,334 mg  1,334 mg Oral TID WC Algernon Huxley, MD   1,334 mg at 03/09/21 1847   carvedilol (COREG) tablet 12.5 mg  12.5 mg Oral BID WC Algernon Huxley, MD   12.5 mg at 03/09/21 1700   Chlorhexidine Gluconate Cloth 2 % PADS 6 each  6 each Topical Q0600 Algernon Huxley, MD       dexmedetomidine (PRECEDEX) 400 MCG/100ML (4 mcg/mL) infusion  0.4-1.2 mcg/kg/hr Intravenous Titrated Nelle Don, MD 21.5 mL/hr at 03/10/21 1225 1 mcg/kg/hr at 03/10/21 1225   hydrALAZINE (APRESOLINE) tablet 25 mg  25 mg Oral Q8H Griffith, Kelly A, DO   25 mg at 03/09/21 2202   HYDROcodone-acetaminophen (NORCO/VICODIN) 5-325 MG per tablet 1-2 tablet  1-2 tablet Oral Q4H PRN Algernon Huxley, MD   2 tablet at 03/09/21 2212   hydrOXYzine (ATARAX) tablet 10 mg  10 mg Oral TID PRN Algernon Huxley, MD       lidocaine (XYLOCAINE) 1 % (with pres) injection    PRN Sandi Mariscal, MD   6 mL at 03/09/21 1600   LORazepam (ATIVAN) tablet 0.5 mg  0.5 mg Oral Once PRN Algernon Huxley, MD       Or   LORazepam (ATIVAN) injection 0.5 mg  0.5 mg Intramuscular Once PRN Algernon Huxley, MD       LORazepam (ATIVAN) injection 1 mg  1 mg Intravenous Once Sharion Settler, NP       losartan (COZAAR) tablet 100 mg  100 mg Oral Daily Algernon Huxley, MD   100 mg at 03/09/21 0913   minoxidil (LONITEN) tablet 5 mg  5 mg Oral Daily Algernon Huxley, MD   5 mg at 03/09/21 1153   ondansetron (ZOFRAN) tablet 4 mg  4 mg Oral Q6H PRN Algernon Huxley, MD       Or   ondansetron [facility]) injection 4 mg  4 mg Intravenous Q6H PRN Algernon Huxley, MD   4 mg at 03/09/21 1901   sodium chloride flush (NS) 0.9 % injection 3 mL  3 mL Intravenous Q12H Algernon Huxley, MD   3 mL at 03/09/21 2230     Musculoskeletal: Strength & Muscle Tone: decreased Gait & Station:  did not witness Patient leans: N/A  Psychiatric Specialty Exam: Physical Exam Vitals and nursing note reviewed.  Constitutional:      Appearance: He is well-developed.  HENT:     Head: Normocephalic.  Pulmonary:     Effort: Pulmonary effort is normal.  Musculoskeletal:     Cervical back: Normal range of motion.  Neurological:     General: No focal deficit present.     Mental Status: He is alert and oriented to person, place, and time.  Psychiatric:        Attention and Perception: Attention and perception normal.        Mood and Affect: Mood is anxious. Affect is angry.        Speech: Speech normal.        Behavior: Behavior normal. Behavior is cooperative.        Thought Content: Thought content normal.        Cognition and Memory: Cognition and memory normal.        Judgment: Judgment normal.    Review of Systems  Constitutional:  Positive for malaise/fatigue.  Psychiatric/Behavioral:  The  patient is nervous/anxious.   All other systems reviewed and are negative.  Blood pressure (!) 148/78, pulse (!) 102, temperature 98.7 F (37.1 C), temperature source Oral, resp. rate 20, height 5\' 9"  (1.753 m), weight 86 kg, SpO2 99 %.Body mass index is 28 kg/m.  General Appearance: Casual  Eye Contact:  Good  Speech:  Normal Rate  Volume:  Increased  Mood:  Angry and Anxious  Affect:  Congruent  Thought Process:  Coherent and Descriptions of Associations: Intact  Orientation:  Full (Time, Place, and Person)  Thought Content:   denial of the severity of his illness  Suicidal Thoughts:  No  Homicidal Thoughts:  No  Memory:  Immediate;   Good Recent;   Good Remote;   Good  Judgement:  Fair  Insight:  Lacking  Psychomotor Activity:  Decreased  Concentration:  Concentration: Fair and Attention Span: Fair  Recall:  Good  Fund of Knowledge:  Good  Language:  Good  Akathisia:  No  Handed:  Right  AIMS (if  indicated):     Assets:  Housing Leisure Time Resilience Social Support  ADL's:  Impaired  Cognition:  WNL  Sleep:        Physical Exam: Physical Exam Vitals and nursing note reviewed.  Constitutional:      Appearance: He is well-developed.  HENT:     Head: Normocephalic.  Pulmonary:     Effort: Pulmonary effort is normal.  Musculoskeletal:     Cervical back: Normal range of motion.  Neurological:     General: No focal deficit present.     Mental Status: He is alert and oriented to person, place, and time.  Psychiatric:        Attention and Perception: Attention and perception normal.        Mood and Affect: Mood is anxious. Affect is angry.        Speech: Speech normal.        Behavior: Behavior normal. Behavior is cooperative.        Thought Content: Thought content normal.        Cognition and Memory: Cognition and memory normal.        Judgment: Judgment normal.   Review of Systems  Constitutional:  Positive for malaise/fatigue.  Psychiatric/Behavioral:  The patient is nervous/anxious.   All other systems reviewed and are negative. Blood pressure (!) 148/78, pulse (!) 102, temperature 98.7 F (37.1 C), temperature source Oral, resp. rate 20, height 5\' 9"  (1.753 m), weight 86 kg, SpO2 99 %. Body mass index is 28 kg/m.  Treatment Plan Summary: Stress reaction causing mixed disturbance of emotion and conduct: Provide client with choices so he can feel more in control Provide explanations prior to providing care (bath, etc.) Validate his feelings, allow time to vent his frustrations  Disposition: Patient does not meet criteria for psychiatric inpatient admission.  Waylan Boga, NP 03/10/2021 12:38 PM

## 2021-03-10 NOTE — Assessment & Plan Note (Signed)
Psychiatry consulted, input appreciated. See Psych recommendations.

## 2021-03-10 NOTE — Assessment & Plan Note (Signed)
Continue empiric antibiotics (Unasyn). Pleural effusion management as below per pulmonology.

## 2021-03-10 NOTE — Progress Notes (Signed)
Patient oxygen is 67%. Patient refusing to wear oxygen. Told this RN to leave the room and would not allow any assessment to be made. MD made aware and is coming up to talk with the patient. Scientist, forensic made aware and are speaking with the patient. Patient still refusing oxygen.

## 2021-03-10 NOTE — Consult Note (Addendum)
[facility] Consultation      Date: 03/10/2021,   MRN# [id number] Travis Long May 18, 1985    CHIEF COMPLAINT:   SOB, abnormal CT Chest   HISTORY OF PRESENT ILLNESS    35 y.o. male with medical history significant for end-stage renal disease on hemodialysis (dialysis days are M/W/F) and hypertension who presents to the ER by EMS for evaluation of worsening shortness of breath for 2 weeks.  Chest x-ray reviewed by me shows increased opacification of left hemithorax likely due to large pleural effusion and associated atelectasis.  01/2020 thoracentesis at [facility] reported as straw colored fluid 11/2020 Thoracentesis evidence of exudative process 12/12 thoracentesis evidence of transudative process  Ct chest reviewed in detail with patient 12 /12/22 Extensive  left sided opacity with effusions loculated with rind of inflammation  Patient states that he has been compliant to his dialysis sessions but missed one treatment last week.    Shortness of breath was initially with exertion but now because at rest.   He denies having any chest pain or lower extremity swelling.   Patient states that he has been noncompliant with his blood pressure medications because he had been unable to climb the stairs due to shortness of breath.  His dry weight is about 82 kg and he denies any significant weight gain. Upon arrival to the ER his blood pressure was 207/129 and he received a dose of IV labetalol.  Respiratory viral panel is negative  No evidence of infection, no fevers, no cough, NL WBC  CBC    Component Value Date/Time   WBC 10.5 03/09/2021 1319   RBC 2.40 (L) 03/09/2021 1319   HGB 7.2 (L) 03/09/2021 1319   HCT 22.0 (L) 03/09/2021 1319   PLT 230 03/09/2021 1319   MCV 91.7 03/09/2021 1319   MCH 30.0 03/09/2021 1319   MCHC 32.7 03/09/2021 1319   RDW 14.7 03/09/2021 1319   LYMPHSABS 0.9 03/06/2021 0643   MONOABS 0.6 03/06/2021 0643   EOSABS 0.6 (H)  03/06/2021 0643   BASOSABS 0.0 03/06/2021 0643     PAST MEDICAL HISTORY   Past Medical History:  Diagnosis Date   ESRD on hemodialysis [facility])    Hypertension      SURGICAL HISTORY   Past Surgical History:  Procedure Laterality Date   A/V FISTULAGRAM Left 03/09/2021   Procedure: A/V Fistulagram;  Surgeon: Algernon Huxley, MD;  Location: [place] [facility];  Service: Cardiovascular;  Laterality: Left;   AV FISTULA PLACEMENT Left    IR CATHETER TUBE CHANGE  03/09/2021     FAMILY HISTORY   Family History  Problem Relation Age of Onset   Hypertension Mother    Diabetes Mellitus II Mother      SOCIAL HISTORY   Social History   Tobacco Use   Smoking status: Former    Types: Cigarettes   Smokeless tobacco: Never  Substance Use Topics   Alcohol use: Not Currently   Drug use: Never     MEDICATIONS    Home Medication:    Current Medication:  Current Facility-Administered Medications:    0.9 %  sodium chloride infusion (Manually program via Guardrails IV Fluids), , Intravenous, Once, Dew, Erskine Squibb, MD   0.9 %  sodium chloride infusion, 250 mL, Intravenous, PRN, Algernon Huxley, MD, Last Rate: 10 mL/hr at 03/09/21 2229, 250 mL at 03/09/21 2229   acetaminophen (TYLENOL) tablet 650 mg, 650 mg, Oral, Q6H PRN **OR** acetaminophen (TYLENOL) suppository 650 mg, 650  mg, Rectal, Q6H PRN, Lucky Cowboy, Erskine Squibb, MD   amLODipine (NORVASC) tablet 10 mg, 10 mg, Oral, Daily, Dew, Erskine Squibb, MD, 10 mg at 03/09/21 0913   Ampicillin-Sulbactam (UNASYN) 3 g in sodium chloride 0.9 % 100 mL IVPB, 3 g, Intravenous, Q12H, Dew, Erskine Squibb, MD, Last Rate: 200 mL/hr at 03/09/21 2229, 3 g at 03/09/21 2229   calcium acetate (PHOSLO) capsule 1,334 mg, 1,334 mg, Oral, TID WC, Algernon Huxley, MD, 1,334 mg at 03/09/21 1847   carvedilol (COREG) tablet 12.5 mg, 12.5 mg, Oral, BID WC, Algernon Huxley, MD, 12.5 mg at 03/09/21 1700   Chlorhexidine Gluconate Cloth 2 % PADS 6 each, 6 each, Topical, Q0600, Dew, Erskine Squibb, MD    dexmedetomidine (PRECEDEX) 400 MCG/100ML (4 mcg/mL) infusion, 0.4-1.2 mcg/kg/hr, Intravenous, Titrated, Nelle Don, MD   hydrALAZINE (APRESOLINE) tablet 25 mg, 25 mg, Oral, Q8H, Griffith, Kelly A, DO, 25 mg at 03/09/21 2202   HYDROcodone-acetaminophen (NORCO/VICODIN) 5-325 MG per tablet 1-2 tablet, 1-2 tablet, Oral, Q4H PRN, Algernon Huxley, MD, 2 tablet at 03/09/21 2212   hydrOXYzine (ATARAX) tablet 10 mg, 10 mg, Oral, TID PRN, Lucky Cowboy, Erskine Squibb, MD   lidocaine (XYLOCAINE) 1 % (with pres) injection, , , PRN, Sandi Mariscal, MD, 6 mL at 03/09/21 1600   LORazepam (ATIVAN) tablet 0.5 mg, 0.5 mg, Oral, Once PRN **OR** LORazepam (ATIVAN) injection 0.5 mg, 0.5 mg, Intramuscular, Once PRN, Lucky Cowboy, Erskine Squibb, MD   LORazepam (ATIVAN) injection 1 mg, 1 mg, Intravenous, Once, Sharion Settler, NP   losartan (COZAAR) tablet 100 mg, 100 mg, Oral, Daily, Dew, Erskine Squibb, MD, 100 mg at 03/09/21 0913   minoxidil (LONITEN) tablet 5 mg, 5 mg, Oral, Daily, Dew, Erskine Squibb, MD, 5 mg at 03/09/21 1153   ondansetron (ZOFRAN) tablet 4 mg, 4 mg, Oral, Q6H PRN **OR** ondansetron (ZOFRAN) injection 4 mg, 4 mg, Intravenous, Q6H PRN, Algernon Huxley, MD, 4 mg at 03/09/21 1901   sodium chloride flush (NS) 0.9 % injection 3 mL, 3 mL, Intravenous, Q12H, Dew, Erskine Squibb, MD, 3 mL at 03/09/21 2230   ziprasidone (GEODON) injection 20 mg, 20 mg, Intramuscular, Once, Nelle Don, MD    ALLERGIES   Patient has no known allergies.     REVIEW OF SYSTEMS    Review of Systems:  Gen:  Denies  fever, sweats, chills weigh loss  HEENT: Denies blurred vision, double vision, ear pain, eye pain, hearing loss, nose bleeds, sore throat Cardiac:  No dizziness, chest pain or heaviness, chest tightness,edema Resp:   +shortness of breath,+DOE Gi: Denies swallowing difficulty, stomach pain, nausea or vomiting, diarrhea, constipation, bowel incontinence Gu:  Denies bladder incontinence, burning urine Ext:   Denies Joint pain, stiffness or swelling Skin:  Denies  skin rash, easy bruising or bleeding or hives Endoc:  Denies polyuria, polydipsia , polyphagia or weight change Psych:   Denies depression, insomnia or hallucinations   Other:  All other systems negative   VS: BP (!) 149/96    Pulse (!) 113    Temp 98.7 F (37.1 C) (Oral)    Resp (!) 21    Ht 5\' 9"  (1.753 m)    Wt 86 kg    SpO2 98%    BMI 28.00 kg/m      PHYSICAL EXAM  General Appearance: No distress  EYES PERRLA, EOM intact.   NECK Supple, No JVD Pulmonary: diminished aeration both bases, rub on left, CT left CardiovascularNormal S1,S2.  No m/r/g.   Abdomen: Benign, Soft,  non-tender. Skin:   warm, no rashes, no ecchymosis  Extremities: normal, no cyanosis, clubbing. Neuro:without focal findings,  speech normal  PSYCHIATRIC: Has become acuity belligerent and irrational     ALL OTHER ROS ARE NEGATIVE      IMAGING    CT CHEST WO CONTRAST  Result Date: 03/06/2021 CLINICAL DATA:  Pneumonia, left pleural effusion and status post left thoracentesis with removal of 750 mL of fluid. Post thoracentesis chest x-ray shows a small amount new air in the pleural space towards the apex. EXAM: CT CHEST WITHOUT CONTRAST TECHNIQUE: Multidetector CT imaging of the chest was performed following the standard protocol without IV contrast. COMPARISON:  Chest x-ray earlier today. FINDINGS: Cardiovascular: The heart size is within normal limits. No pericardial fluid identified. The thoracic aorta and central pulmonary arteries are normal in caliber. A tunneled dialysis catheter is present inserted via the lower right internal jugular vein and extending to the SVC/RA junction. Mediastinum/Nodes: Multiple scattered mediastinal lymph nodes are visualized. The largest in the right lower paratracheal region measure approximately 12 mm in maximum short axis diameter. Lymph nodes are most likely reactive given acute pulmonary findings. The thyroid gland appears unremarkable. Lungs/Pleura: There is  consolidation of the left lower lobe likely consistent with acute pneumonia. Associated loculated parapneumonic pleural fluid on the left may be consistent with empyema. A small amount of air is present at the apex posteriorly in the pleural space likely introduced at the time of the thoracentesis procedure. This does not represent significant pneumothorax. There is a small right pleural effusion. Potential component of subtle pneumonia at the right lung base. Upper Abdomen: No acute abnormality. Musculoskeletal: No chest wall mass or suspicious bone lesions identified. IMPRESSION: 1. Left lower lobe consolidation consistent with acute pneumonia. Associated loculated parapneumonic pleural fluid may be consistent with developing empyema. A small amount of pleural air at the apex may have been introduced at the time of the thoracentesis and does not represent a significant pneumothorax. 2. Small right pleural effusion with possible component of subtle pneumonia at the right lung base. 3. Multiple scattered small mediastinal lymph nodes which may be reactive given pulmonary findings. Electronically Signed   By: Aletta Edouard M.D.   On: 03/06/2021 10:02   IR Catheter Tube Change  Result Date: 03/09/2021 CLINICAL DATA:  Inadvertent partial retraction of left-sided chest tube. Please perform fluoroscopic guided exchange, up sizing and repositioning. EXAM: IR CATHETER TUBE CHANGE COMPARISON:  CT-guided left-sided chest tube placement-03/08/2021 Chest radiograph-earlier same day CONTRAST:  None MEDICATIONS: None. ANESTHESIA/SEDATION: None FLUOROSCOPY TIME:  18 seconds (4.2 mGy) TECHNIQUE: Patient was positioned supine, slightly RPO on the fluoroscopy table. The external portion of the existing left-sided chest tube as well as the surrounding skin was prepped and draped in usual sterile fashion. A preprocedural spot fluoroscopic image was obtained of the existing percutaneous drainage catheter. A small amount of  contrast was injected via the existing percutaneous drainage catheter and several fluoroscopic images were obtained in various obliquities. The external portion of the chest tube was cut and cannulated with a short Amplatz wire. Under intermittent fluoroscopic guidance, the existing 14 French chest tube was exchanged for a new 24 French percutaneous drainage catheter with end coiled and locked within the more superior aspect of the left hemithorax. The new left-sided chest tube was connected to a pleura vac device and secured in place with 2 interrupted sutures and a StatLock device. A Vaseline gauze dressing was applied. The patient tolerated the procedure well without immediate  postprocedural complication. FINDINGS: Preprocedural spot fluoroscopic image demonstrates partial retraction of recently placed left-sided chest tube. After fluoroscopic guided exchange, repositioning and up sizing, the new 16 French chest tube is more ideally positioned within the more cranial aspect of the left pleural space. IMPRESSION: Successful fluoroscopic guided exchange, up sizing and repositioning of now 16 French left-sided chest tube. PLAN: - Continued chest tube management as per the critical care team. Electronically Signed   By: Sandi Mariscal M.D.   On: 03/09/2021 17:00   PERIPHERAL VASCULAR CATHETERIZATION  Result Date: 03/09/2021 See surgical note for result.  DG Chest Port 1 View  Result Date: 03/10/2021 CLINICAL DATA:  Hypoxia. Recent chest tube placement for left-sided pneumothorax. EXAM: PORTABLE CHEST 1 VIEW COMPARISON:  Radiographs 03/09/2021 and 03/07/2021.  CT 03/06/2021. FINDINGS: 0921 hours. The recently upsized left chest tube is better positioned, overlying the medial left chest. The loculated left hydropneumothorax appears slightly smaller. Underlying small bilateral pleural effusions and bilateral airspace opacities are unchanged. The heart size and mediastinal contours are stable. Right IJ central  venous catheter projects to the superior cavoatrial junction. IMPRESSION: Improved position of small left-sided chest tube with slight improvement in loculated hydropneumothorax at the left costophrenic angle. No other significant changes. Electronically Signed   By: Richardean Sale M.D.   On: 03/10/2021 09:39   DG Chest Port 1 View  Result Date: 03/09/2021 CLINICAL DATA:  Pleural effusion EXAM: PORTABLE CHEST 1 VIEW COMPARISON:  Chest x-ray dated December 13th 2022 FINDINGS: Visualized cardiac and mediastinal contours are unchanged. Small left hydropneumothorax with chest tube in place. Small right pleural effusion. Right chest wall central venous catheter with tip positioned at the superior cavoatrial junction. Bilateral interstitial opacities, likely due to pulmonary edema. IMPRESSION: Small left hydropneumothorax with chest tube in place. Electronically Signed   By: Yetta Glassman M.D.   On: 03/09/2021 13:58   DG Chest Port 1 View  Result Date: 03/07/2021 CLINICAL DATA:  Lucas's/pneumonia EXAM: PORTABLE CHEST 1 VIEW COMPARISON:  Previous studies including the examination of 03/06/2022 FINDINGS: Transverse diameter of heart is increased. Moderate to large left pleural effusion is present. There is small pocket of air in the left apical region with possible slight decrease. There is no evidence of right pneumothorax. Central pulmonary vessels are prominent. There is prominence of interstitial markings in the right parahilar region and right lower lung fields, possibly suggesting interstitial edema. Evaluation of left lung for infiltrates or pulmonary edema is limited due to overlying large left pleural effusion. Tip of dialysis catheter is seen in the right atrium close to its junction with superior vena cava. IMPRESSION: Moderate to large partly loculated left pleural effusion with no significant interval change. Small right pleural effusion. There are linear densities in the left mid and both lower  lung fields suggesting atelectasis/pneumonia. Cardiomegaly. Central pulmonary vessels are prominent suggesting CHF. Electronically Signed   By: Elmer Picker M.D.   On: 03/07/2021 13:42   DG Chest Port 1 View  Result Date: 03/06/2021 CLINICAL DATA:  Post thoracentesis on the left. EXAM: PORTABLE CHEST 1 VIEW COMPARISON:  Same day chest radiograph. FINDINGS: No substantial change in size of a left pleural effusion, which is likely loculated. New small area of gas within the superior aspect of the left pleural effusion with air-fluid level. similar small right pleural effusion. Similar left lung opacification. Cardiac silhouette is largely obscured. Similar positioning of a right IJ dialysis catheter. IMPRESSION: 1. No substantial change in size of a left pleural effusion,  which is likely loculated. New small area of gas within the superior aspect of the left pleural effusion with air-fluid level, probably introduced during interval thoracentesis. Recommend follow-up chest radiograph two ensure stability/resolution and exclude developing pneumothorax. 2. Similar left lung opacification, probably compressive atelectasis. Pneumonia is not excluded. 3. Similar small right pleural effusion. Findings discussed with the patient's nurse Hand who will inform the physician. Electronically Signed   By: Margaretha Sheffield M.D.   On: 03/06/2021 09:15   DG Chest Portable 1 View  Result Date: 03/06/2021 CLINICAL DATA:  Chest pain EXAM: PORTABLE CHEST 1 VIEW COMPARISON:  12/01/2020 FINDINGS: Increased opacification of the left hemithorax likely due to large pleural effusion and associated atelectasis. Small right pleural effusion is also present with adjacent atelectasis. Cardiomediastinal contours are obscured. Right dialysis catheter is present. IMPRESSION: Increased opacification of left hemithorax likely due to large pleural effusion and associated atelectasis. There is also a small right pleural effusion with  adjacent atelectasis. Electronically Signed   By: Macy Mis M.D.   On: 03/06/2021 07:54   ECHOCARDIOGRAM COMPLETE  Result Date: 03/09/2021    ECHOCARDIOGRAM REPORT   Patient Name:   Travis Long Date of Exam: 03/09/2021 Medical Rec #:  [id number]     Height:       69.0 in Accession #:    [id number]    Weight:       189.6 lb Date of Birth:  [birth year]     BSA:          2.020 m Patient Age:    33 years      BP:           145/87 mmHg Patient Gender: M             HR:           78 bpm. Exam Location:  [facility] Procedure: 2D Echo, Cardiac Doppler, Color Doppler and Strain Analysis Indications:     CHF-acute diastolic H06.23  History:         Patient has no prior history of Echocardiogram examinations.                  Risk Factors:Hypertension. ESRD.  Sonographer:     Sherrie Sport Referring Phys:  Kimberly DEW Diagnosing Phys: Ida Rogue MD  Sonographer Comments: Global longitudinal strain was attempted. IMPRESSIONS  1. Left ventricular ejection fraction, by estimation, is 60 to 65%. The left ventricle has normal function. The left ventricle has no regional wall motion abnormalities. There is severe left ventricular hypertrophy. Left ventricular diastolic parameters  are consistent with Grade II diastolic dysfunction (pseudonormalization). The average left ventricular global longitudinal strain is -8.5 %. The global longitudinal strain is abnormal.  2. Right ventricular systolic function is normal. The right ventricular size is normal. Moderately increased right ventricular wall thickness.  3. Left atrial size was mildly dilated.  4. A small pericardial effusion is present.  5. The mitral valve is normal in structure. No evidence of mitral valve regurgitation. No evidence of mitral stenosis.  6. The aortic valve is normal in structure. Aortic valve regurgitation is not visualized. No aortic stenosis is present.  7. The inferior vena cava is normal in size with greater than 50% respiratory variability,  suggesting right atrial pressure of 3 mmHg. FINDINGS  Left Ventricle: Left ventricular ejection fraction, by estimation, is 60 to 65%. The left ventricle has normal function. The left ventricle has no regional wall motion abnormalities. The average left ventricular  global longitudinal strain is -8.5 %. The  global longitudinal strain is abnormal. The left ventricular internal cavity size was normal in size. There is severe left ventricular hypertrophy. Left ventricular diastolic parameters are consistent with Grade II diastolic dysfunction (pseudonormalization). Right Ventricle: The right ventricular size is normal. Moderately increased right ventricular wall thickness. Right ventricular systolic function is normal. Left Atrium: Left atrial size was mildly dilated. Right Atrium: Right atrial size was normal in size. Pericardium: A small pericardial effusion is present. Mitral Valve: The mitral valve is normal in structure. No evidence of mitral valve regurgitation. No evidence of mitral valve stenosis. MV peak gradient, 8.1 mmHg. The mean mitral valve gradient is 5.0 mmHg. Tricuspid Valve: The tricuspid valve is normal in structure. Tricuspid valve regurgitation is mild . No evidence of tricuspid stenosis. Aortic Valve: The aortic valve is normal in structure. Aortic valve regurgitation is not visualized. No aortic stenosis is present. Aortic valve mean gradient measures 4.0 mmHg. Aortic valve peak gradient measures 8.5 mmHg. Aortic valve area, by VTI measures 3.00 cm. Pulmonic Valve: The pulmonic valve was normal in structure. Pulmonic valve regurgitation is mild. No evidence of pulmonic stenosis. Aorta: The aortic root is normal in size and structure. Venous: The inferior vena cava is normal in size with greater than 50% respiratory variability, suggesting right atrial pressure of 3 mmHg. IAS/Shunts: No atrial level shunt detected by color flow Doppler.  LEFT VENTRICLE PLAX 2D LVIDd:         4.60 cm   Diastology  LVIDs:         3.10 cm   LV e' medial:    6.20 cm/s LV PW:         1.80 cm   LV E/e' medial:  21.0 LV IVS:        1.20 cm   LV e' lateral:   4.79 cm/s LVOT diam:     2.00 cm   LV E/e' lateral: 27.1 LV SV:         68 LV SV Index:   33        2D Longitudinal Strain LVOT Area:     3.14 cm  2D Strain GLS Avg:     -8.5 %  RIGHT VENTRICLE RV Basal diam:  3.70 cm RV S prime:     16.20 cm/s TAPSE (M-mode): 4.2 cm LEFT ATRIUM             Index        RIGHT ATRIUM           Index LA diam:        4.10 cm 2.03 cm/m   RA Area:     17.30 cm LA Vol (A2C):   73.4 ml 36.34 ml/m  RA Volume:   53.60 ml  26.54 ml/m LA Vol (A4C):   66.7 ml 33.03 ml/m LA Biplane Vol: 75.5 ml 37.38 ml/m  AORTIC VALVE                    PULMONIC VALVE AV Area (Vmax):    2.73 cm     PV Vmax:          0.97 m/s AV Area (Vmean):   3.08 cm     PV Vmean:         73.600 cm/s AV Area (VTI):     3.00 cm     PV VTI:           0.231 m AV Vmax:  146.00 cm/s  PV Peak grad:     3.8 mmHg AV Vmean:          90.900 cm/s  PV Mean grad:     2.0 mmHg AV VTI:            0.225 m      PR End Diast Vel: 6.15 msec AV Peak Grad:      8.5 mmHg     RVOT Peak grad:   6 mmHg AV Mean Grad:      4.0 mmHg LVOT Vmax:         127.00 cm/s LVOT Vmean:        89.000 cm/s LVOT VTI:          0.215 m LVOT/AV VTI ratio: 0.96  AORTA Ao Root diam: 3.17 cm MITRAL VALVE                TRICUSPID VALVE MV Area (PHT): 3.65 cm     TR Peak grad:   15.1 mmHg MV Area VTI:   2.04 cm     TR Vmax:        194.00 cm/s MV Peak grad:  8.1 mmHg MV Mean grad:  5.0 mmHg     SHUNTS MV Vmax:       1.42 m/s     Systemic VTI:  0.22 m MV Vmean:      108.0 cm/s   Systemic Diam: 2.00 cm MV Decel Time: 208 msec     Pulmonic VTI:  0.236 m MV E velocity: 130.00 cm/s MV A velocity: 115.00 cm/s MV E/A ratio:  1.13 Ida Rogue MD Electronically signed by Ida Rogue MD Signature Date/Time: 03/09/2021/2:47:55 PM    Final    US THORACENTESIS ASP PLEURAL SPACE W/IMG GUIDE  Result Date:  03/06/2021 INDICATION: Patient with shortness of breath and left pleural effusion request received for thoracentesis. EXAM: ULTRASOUND GUIDED LEFT THORACENTESIS MEDICATIONS: Local 1% lidocaine only. COMPLICATIONS: None immediate. PROCEDURE: An ultrasound guided thoracentesis was thoroughly discussed with the patient and questions answered. The benefits, risks, alternatives and complications were also discussed. The patient understands and wishes to proceed with the procedure. Written consent was obtained. Ultrasound was performed to localize and mark an adequate pocket of fluid in the left chest. The area was then prepped and draped in the normal sterile fashion. 1% Lidocaine was used for local anesthesia. Under ultrasound guidance a 19 gauge, 7-cm, Yueh catheter was introduced. Thoracentesis was performed. The catheter was removed and a dressing applied. Procedure was stopped early secondary to chest pain with remaining small left pleural effusion seen postprocedure. FINDINGS: A total of approximately 750 mL of amber colored fluid was removed. Samples were sent to the laboratory as requested by the clinical team. IMPRESSION: Successful ultrasound guided left thoracentesis yielding 750 mL of pleural fluid. This exam was performed by Tsosie Billing PA-C, and was supervised and interpreted by Dr. Kathlene Cote. Electronically Signed   By: Aletta Edouard M.D.   On: 03/06/2021 09:39      ASSESSMENT/PLAN      03/10/21                                                              03/09/21  03/07/21     Loculated LEFT Effusion   Trapped LEFT Lung   Bilateral Pleural Effusions   ESRD-HD   HFpEF/Grade 2 DD   HTN, uncontrolled  TPA instilled via CT for 2 days with removal of roughly 2200 cc of fluid (this after -737mL thora, so close to 3L) The character has changed from deep brown to now serosanguinous  The first chest tube was dislodged, with a  leak, and so hydro-pneumo was thought to be from that event Unfortunately, the contour of the lung has remained the same on serial CXR even with replacement of the first tube The lung is trapped/fibrin belted, and is not amenable to removal or TPA therapy. The patient was made aware this was the likely outcome of this trial and he would likely need VATS  At this point, will place to water seal and allow hydro-pneumo to convert back to fluid filled state. The fact that 3L has been removed given the static nature of the film, suggests there is pleural fluid production remains high and could be multimodal not only from inflammation, but ongoing diastolic dysfunction, and perhaps too conservative a dry weight. Indeed the right lung has more interstitial markings today.  HD today   Plan to [facility] to [name]  for thoracic consultation and further management.

## 2021-03-10 NOTE — Assessment & Plan Note (Signed)
Present on admission, due to noncompliance with antihypertensives.  Home regimen includes amlodipine, Coreg, losartan, minoxidil. BP improved with dialysis, but remains elevated.  Pt refuses meds at times. -- Continue antihypertensives -- Increased hydralazine 10 >> 25 mg TID -- Monitor BP closely

## 2021-03-10 NOTE — Progress Notes (Addendum)
5am: Patient refusing labs, chest tube dressing reinforcing, and stating that he needs information on medical condition because "everything is not adding up." Is worried that he has not had as much chest tube output as he was told. 76 ml is output for shift. NP Morrision ordered Ativan and refused that as well. Took EKG, O2 sensor, and oxygen off. O2 sats ranging from 78%-96% RA. Provider is aware, has talked to patient, and is recommending pysch eval. Will pass along to dayshift nurse.

## 2021-03-10 NOTE — Assessment & Plan Note (Signed)
Counseled extensively on the importance of cessation

## 2021-03-11 ENCOUNTER — Inpatient Hospital Stay (HOSPITAL_COMMUNITY)
Admission: AD | Admit: 2021-03-11 | Discharge: 2021-03-15 | DRG: 291 | Disposition: A | Discharge status: Home / Self Care | Payer: Medicare Other | Source: Other Acute Inpatient Hospital | Attending: Student | Admitting: Student

## 2021-03-11 ENCOUNTER — Inpatient Hospital Stay (HOSPITAL_COMMUNITY): Payer: Medicare Other

## 2021-03-11 DIAGNOSIS — J9601 Acute respiratory failure with hypoxia: Secondary | ICD-10-CM | POA: Diagnosis present

## 2021-03-11 DIAGNOSIS — Z79899 Other long term (current) drug therapy: Secondary | ICD-10-CM | POA: Diagnosis not present

## 2021-03-11 DIAGNOSIS — Z91199 Patient's noncompliance with other medical treatment and regimen due to unspecified reason: Secondary | ICD-10-CM | POA: Diagnosis not present

## 2021-03-11 DIAGNOSIS — J939 Pneumothorax, unspecified: Secondary | ICD-10-CM | POA: Diagnosis present

## 2021-03-11 DIAGNOSIS — J189 Pneumonia, unspecified organism: Secondary | ICD-10-CM | POA: Diagnosis present

## 2021-03-11 DIAGNOSIS — J90 Pleural effusion, not elsewhere classified: Secondary | ICD-10-CM | POA: Diagnosis not present

## 2021-03-11 DIAGNOSIS — I5033 Acute on chronic diastolic (congestive) heart failure: Secondary | ICD-10-CM | POA: Diagnosis present

## 2021-03-11 DIAGNOSIS — I160 Hypertensive urgency: Secondary | ICD-10-CM | POA: Diagnosis not present

## 2021-03-11 DIAGNOSIS — F4320 Adjustment disorder, unspecified: Secondary | ICD-10-CM | POA: Diagnosis present

## 2021-03-11 DIAGNOSIS — D649 Anemia, unspecified: Secondary | ICD-10-CM

## 2021-03-11 DIAGNOSIS — M898X9 Other specified disorders of bone, unspecified site: Secondary | ICD-10-CM | POA: Diagnosis present

## 2021-03-11 DIAGNOSIS — Z20822 Contact with and (suspected) exposure to covid-19: Secondary | ICD-10-CM | POA: Diagnosis present

## 2021-03-11 DIAGNOSIS — G9340 Encephalopathy, unspecified: Secondary | ICD-10-CM | POA: Diagnosis present

## 2021-03-11 DIAGNOSIS — Z8249 Family history of ischemic heart disease and other diseases of the circulatory system: Secondary | ICD-10-CM | POA: Diagnosis not present

## 2021-03-11 DIAGNOSIS — J948 Other specified pleural conditions: Secondary | ICD-10-CM | POA: Diagnosis not present

## 2021-03-11 DIAGNOSIS — G9341 Metabolic encephalopathy: Secondary | ICD-10-CM | POA: Diagnosis present

## 2021-03-11 DIAGNOSIS — I5030 Unspecified diastolic (congestive) heart failure: Secondary | ICD-10-CM

## 2021-03-11 DIAGNOSIS — Z9114 Patient's other noncompliance with medication regimen: Secondary | ICD-10-CM

## 2021-03-11 DIAGNOSIS — N186 End stage renal disease: Secondary | ICD-10-CM | POA: Diagnosis present

## 2021-03-11 DIAGNOSIS — D631 Anemia in chronic kidney disease: Secondary | ICD-10-CM | POA: Diagnosis present

## 2021-03-11 DIAGNOSIS — I161 Hypertensive emergency: Secondary | ICD-10-CM | POA: Diagnosis present

## 2021-03-11 DIAGNOSIS — N2581 Secondary hyperparathyroidism of renal origin: Secondary | ICD-10-CM | POA: Diagnosis present

## 2021-03-11 DIAGNOSIS — E871 Hypo-osmolality and hyponatremia: Secondary | ICD-10-CM | POA: Diagnosis not present

## 2021-03-11 DIAGNOSIS — Z992 Dependence on renal dialysis: Secondary | ICD-10-CM

## 2021-03-11 DIAGNOSIS — I5031 Acute diastolic (congestive) heart failure: Secondary | ICD-10-CM

## 2021-03-11 DIAGNOSIS — I132 Hypertensive heart and chronic kidney disease with heart failure and with stage 5 chronic kidney disease, or end stage renal disease: Secondary | ICD-10-CM | POA: Diagnosis present

## 2021-03-11 LAB — BPAM RBC
Blood Product Expiration Date: 202301112359
ISSUE DATE / TIME: 202212162039
Unit Type and Rh: 5100

## 2021-03-11 LAB — TYPE AND SCREEN
ABO/RH(D): O POS
ABO/RH(D): O POS
Antibody Screen: NEGATIVE
Antibody Screen: NEGATIVE
Unit division: 0

## 2021-03-11 LAB — BASIC METABOLIC PANEL
Anion gap: 14 (ref 5–15)
BUN: 26 mg/dL — ABNORMAL HIGH (ref 6–20)
CO2: 27 mmol/L (ref 22–32)
Calcium: 8.8 mg/dL — ABNORMAL LOW (ref 8.9–10.3)
Chloride: 96 mmol/L — ABNORMAL LOW (ref 98–111)
Creatinine, Ser: 10.63 mg/dL — ABNORMAL HIGH (ref 0.61–1.24)
GFR, Estimated: 6 mL/min — ABNORMAL LOW (ref >60)
Glucose, Bld: 109 mg/dL — ABNORMAL HIGH (ref 70–99)
Potassium: 5.1 mmol/L (ref 3.5–5.1)
Sodium: 137 mmol/L (ref 135–145)

## 2021-03-11 LAB — CBC
HCT: 22.2 % — ABNORMAL LOW (ref 39.0–52.0)
Hemoglobin: 7 g/dL — ABNORMAL LOW (ref 13.0–17.0)
MCH: 28.9 pg (ref 26.0–34.0)
MCHC: 31.5 g/dL (ref 30.0–36.0)
MCV: 91.7 fL (ref 80.0–100.0)
Platelets: 217 10*3/uL (ref 150–400)
RBC: 2.42 MIL/uL — ABNORMAL LOW (ref 4.22–5.81)
RDW: 14.2 % (ref 11.5–15.5)
WBC: 8.4 10*3/uL (ref 4.0–10.5)
nRBC: 0 % (ref 0.0–0.2)

## 2021-03-11 LAB — MRSA NEXT GEN BY PCR, NASAL: MRSA by PCR Next Gen: NOT DETECTED

## 2021-03-11 MED ORDER — LABETALOL HCL 5 MG/ML IV SOLN
10.0000 mg | INTRAVENOUS | Status: DC | PRN
  Administered 2021-03-11: 10 mg via INTRAVENOUS
  Filled 2021-03-11: qty 4

## 2021-03-11 MED ORDER — ORAL CARE MOUTH RINSE
15.0000 mL | Freq: Two times a day (BID) | OROMUCOSAL | Status: DC
  Administered 2021-03-11 – 2021-03-14 (×7): 15 mL via OROMUCOSAL

## 2021-03-11 MED ORDER — DEXMEDETOMIDINE HCL IN NACL 400 MCG/100ML IV SOLN: 0.4000 ug/kg/h | INTRAVENOUS | Status: DC

## 2021-03-11 MED ORDER — CARVEDILOL 12.5 MG PO TABS
25.0000 mg | ORAL_TABLET | Freq: Two times a day (BID) | ORAL | Status: DC
  Administered 2021-03-11 – 2021-03-15 (×9): 25 mg via ORAL
  Filled 2021-03-11 (×9): qty 2

## 2021-03-11 MED ORDER — HEPARIN SODIUM (PORCINE) 5000 UNIT/ML IJ SOLN
5000.0000 [IU] | Freq: Three times a day (TID) | INTRAMUSCULAR | Status: DC
  Administered 2021-03-13: 21:00:00 5000 [IU] via SUBCUTANEOUS
  Filled 2021-03-11 (×4): qty 1

## 2021-03-11 MED ORDER — HYDRALAZINE HCL 50 MG PO TABS
100.0000 mg | ORAL_TABLET | Freq: Three times a day (TID) | ORAL | Status: DC
  Administered 2021-03-11 – 2021-03-15 (×10): 100 mg via ORAL
  Filled 2021-03-11 (×12): qty 2

## 2021-03-11 MED ORDER — AMLODIPINE BESYLATE 10 MG PO TABS
10.0000 mg | ORAL_TABLET | Freq: Every day | ORAL | Status: DC
  Administered 2021-03-11 – 2021-03-15 (×4): 10 mg via ORAL
  Filled 2021-03-11 (×5): qty 1

## 2021-03-11 MED ORDER — MINOXIDIL 2.5 MG PO TABS
5.0000 mg | ORAL_TABLET | Freq: Every day | ORAL | Status: DC
  Administered 2021-03-11 – 2021-03-15 (×4): 5 mg via ORAL
  Filled 2021-03-11 (×5): qty 2

## 2021-03-11 MED ORDER — CARVEDILOL 12.5 MG PO TABS: 12.5000 mg | ORAL_TABLET | Freq: Two times a day (BID) | ORAL | Status: DC

## 2021-03-11 MED ORDER — ONDANSETRON HCL 4 MG/2ML IJ SOLN
4.0000 mg | Freq: Three times a day (TID) | INTRAMUSCULAR | Status: DC | PRN
  Administered 2021-03-12 (×2): 4 mg via INTRAVENOUS
  Filled 2021-03-11 (×2): qty 2

## 2021-03-11 MED ORDER — SODIUM CHLORIDE 0.9 % IV SOLN
3.0000 g | Freq: Two times a day (BID) | INTRAVENOUS | Status: DC
  Administered 2021-03-11 – 2021-03-13 (×5): 3 g via INTRAVENOUS
  Filled 2021-03-11 (×5): qty 8

## 2021-03-11 MED ORDER — METOCLOPRAMIDE HCL 5 MG/ML IJ SOLN: 10.0000 mg | Freq: Four times a day (QID) | INTRAMUSCULAR | Status: DC

## 2021-03-11 MED ORDER — DOCUSATE SODIUM 100 MG PO CAPS: 100.0000 mg | ORAL_CAPSULE | Freq: Two times a day (BID) | ORAL | Status: DC | PRN

## 2021-03-11 MED ORDER — METOCLOPRAMIDE HCL 5 MG/ML IJ SOLN
10.0000 mg | Freq: Four times a day (QID) | INTRAMUSCULAR | Status: DC
  Administered 2021-03-11 – 2021-03-12 (×5): 10 mg via INTRAVENOUS
  Filled 2021-03-11 (×6): qty 2

## 2021-03-11 MED ORDER — CHLORHEXIDINE GLUCONATE CLOTH 2 % EX PADS
6.0000 | MEDICATED_PAD | Freq: Every day | CUTANEOUS | Status: DC
  Administered 2021-03-11 – 2021-03-12 (×2): 6 via TOPICAL

## 2021-03-11 MED ORDER — POLYETHYLENE GLYCOL 3350 17 G PO PACK: 17.0000 g | PACK | Freq: Every day | ORAL | Status: DC | PRN

## 2021-03-11 MED ORDER — HYDRALAZINE HCL 25 MG PO TABS: 25.0000 mg | ORAL_TABLET | Freq: Three times a day (TID) | ORAL | Status: DC

## 2021-03-11 NOTE — Progress Notes (Signed)
Discharged patient into care of [facility] for transport to [facility] for higher level of care

## 2021-03-11 NOTE — Progress Notes (Signed)
Has bed available at [facility], report called to [facility], arranged transport with law enforcement and [facility].

## 2021-03-11 NOTE — Progress Notes (Signed)
NAME:  Travis [facility], MRN:  [id number], DOB:  Nov 19, 1985, LOS: 0 ADMISSION DATE:  03/11/2021, CONSULTATION DATE:  12/17 REFERRING MD:  Dr. Arbutus Ped, CHIEF COMPLAINT:  Loculated pleural effusion   History of Present Illness:  Patient is a 35 yo M w/ pertinent PMH of HTN, ESRD on HD MWF present to [facility] on 12/12 with increasing SOB.  Patient has not been adherent with BP meds and missed 1 session of dialysis last week.  Patient has had history of pleural effusions in 01/2020 and 11/2020 thoracentesis showed exudative process.  On 12/12 patient develops progressive SOB and was admitted to [facility].  CXR showed bilateral pleural effusions L >R and questionable pneumonia.  Patient mildly hypoxic on 2 L [place].  Patient very hypertensive.  Thoracentesis performed 12/12 and pleural studies indicate transudative process.  On 12/14 left sided pigtail chest tube placed with 850 cc output in patient was given tPA through tube.  On 12/15, chest tube halfway out and had IR replaced chest tube.  Patient's pleural effusion still remains loculated and not responding to tPA.  Patient admits being transferred to [facility] from [facility] for CT surgery evaluation and possible VATS.  On 12/16 patient is having some delirium/agitation and pulling at lines.  Was started on Precedex and given Ativan.  PCCM consulted for ICU admission  Pertinent  Medical History   Past Medical History:  Diagnosis Date   ESRD on hemodialysis ([place])    Hypertension      Significant Long Events: Including procedures, antibiotic start and stop dates in addition to other pertinent events   12/12: Admitted to [facility] for pleural effusion 12/17: transferred to [facility] from [facility] for loculated Left pleural effusion and VATS evaluation from CT surgery  Interim History / Subjective:  Patient is lethargic but opens eyes with vocal stimuli but goes back to sleep immediately He vomited this morning Repeat x-ray chest shows worsening left-sided pleural  effusion  Objective   Blood pressure (!) 182/113, pulse 76, temperature 99.6 F (37.6 C), temperature source Axillary, resp. rate (!) 23, SpO2 100 %.       No intake or output data in the 24 hours ending 03/11/21 1001 There were no vitals filed for this visit.  Examination: Physical exam: General: Acutely ill-appearing young African-American male, lying on the bed HEENT: /AT, eyes anicteric.  moist mucus membranes Neuro: Lethargic, opens eyes with vocal stimuli, falls back to sleep, moving all 4 extremities, intermittently following commands but Chest: Absent air entry in left middle and lower lung zone, no wheezes or rhonchi Heart: Regular rate and rhythm, no murmurs or gallops Abdomen: Soft, nontender, nondistended, bowel sounds present Skin: No rash    Resolved Long Problem list     Assessment & Plan:  Acute respiratory failure with hypoxia Recurrent bilateral pleural effusions, left more than right Loculated left pleural effusion Continue titrate nasal cannula oxygen with O2 sat goal >90% Closely monitor chest tube output TCTS consulted for evaluation and help with management, as patient did not respond well to pleural thrombolytic therapy Continue chest tube - -20 suction bleeding  Chronic HFpEF: Echo 12/15: Grade 2 DD; EF 60 to 65% Patient looks euvolemic and Strict I/O's and daily weights  CAP Patient remained afebrile Continue Unasyn  Acute metabolic encephalopathy Patient remains lethargic He was on Precedex infusion until this morning which was stopped Avoid sedation  Hypertensive urgency Patient blood pressure is not well controlled Increase Coreg to 25 mg twice daily Increase hydralazine 200 mg 2 times  daily Continue amlodipine 10 mg daily and minoxidil  ESRD on HD MWF HD per nephro Replace electrolytes as indicated Avoid nephrotoxic agents, ensure adequate renal perfusion  Acute on chronic anemia in ESRD EPO with dialysis per nephro Trend  CBC Transfuse if hemoglobin less than 7  Tobacco dependence Continue nicotine patch to prevent nicotine withdrawal Smoking cessation counseling once stable  Hyponatremia Resolved  Best Practice (right click and "Reselect all SmartList Selections" daily)   Diet/type: NPO bedside swallow evaluation DVT prophylaxis: prophylactic heparin  GI prophylaxis: PPI Lines: N/A Foley:  N/A Code Status:  full code Last date of multidisciplinary goals of care discussion [pending]  Labs   CBC: Recent Labs  Lab 03/06/21 0643 03/07/21 0624 03/08/21 0930 03/09/21 1319 03/10/21 1541 03/11/21 0647  WBC 9.5 8.6 7.2 10.5 7.9 8.4  NEUTROABS 7.3  --   --   --   --   --   HGB 6.9* 7.2* 6.9* 7.2* 6.5* 7.0*  HCT 21.6* 21.3* 21.1* 22.0* 20.2* 22.2*  MCV 91.5 89.1 90.2 91.7 89.8 91.7  PLT 213 222 215 230 204 366    Basic Metabolic Panel: Recent Labs  Lab 03/07/21 0624 03/08/21 0930 03/09/21 1319 03/10/21 1541 03/11/21 0647  NA 135 134* 135 133* 137  K 5.1 3.8 4.7 5.3* 5.1  CL 98 95* 93* 91* 96*  CO2 28 26 31 31 27   GLUCOSE 94 86 127* 97 109*  BUN 47* 41* 49* 49* 26*  CREATININE 12.44* 9.65* 12.44* 15.69* 10.63*  CALCIUM 8.4* 8.0* 8.3* 8.2* 8.8*  PHOS  --  3.8  --  8.0*  --    GFR: Estimated Creatinine Clearance: 10.7 mL/min (A) (by C-G formula based on SCr of 10.63 mg/dL (H)). Recent Labs  Lab 03/08/21 0930 03/09/21 1319 03/10/21 1541 03/11/21 0647  WBC 7.2 10.5 7.9 8.4    Liver Function Tests: Recent Labs  Lab 03/06/21 0643 03/08/21 0930 03/10/21 1541  AST 18  --   --   ALT 10  --   --   ALKPHOS 57  --   --   BILITOT 0.9  --   --   PROT 8.9*  --   --   ALBUMIN 3.8 3.0* 3.1*   No results for input(s): LIPASE, AMYLASE in the last 168 hours. No results for input(s): AMMONIA in the last 168 hours.  ABG    Component Value Date/Time   HCO3 31.2 (H) 11/30/2020 1002   ACIDBASEDEF 4.7 (H) 09/16/2020 0609   O2SAT 79.2 11/30/2020 1002     Coagulation  Profile: Recent Labs  Lab 03/07/21 0624  INR 1.2    Cardiac Enzymes: No results for input(s): CKTOTAL, CKMB, CKMBINDEX, TROPONINI in the last 168 hours.  HbA1C: No results found for: HGBA1C  CBG: No results for input(s): GLUCAP in the last 168 hours.  Critical care time:     Total additional critical care time: 33 minutes  Performed by: [place] care time was exclusive of separately billable procedures and treating other patients.   Critical care was necessary to treat or prevent imminent or life-threatening deterioration.   Critical care was time spent personally by me on the following activities: development of treatment plan with patient and/or surrogate as well as nursing, discussions with consultants, evaluation of patient's response to treatment, examination of patient, obtaining history from patient or surrogate, ordering and performing treatments and interventions, ordering and review of laboratory studies, ordering and review of radiographic studies, pulse oximetry and  re-evaluation of patient's condition.   Jacky Kindle MD [place] [facility] See Amion for pager If no response to pager, please call [phone] until 7pm After 7pm, Please call E-link [phone]

## 2021-03-11 NOTE — H&P (Addendum)
NAME:  Travis Long, MRN:  [id number], DOB:  03/06/1986, LOS: 0 ADMISSION DATE:  03/11/2021, CONSULTATION DATE:  12/17 REFERRING MD:  Dr. Arbutus Ped, CHIEF COMPLAINT:  Loculated pleural effusion   History of Present Illness:  Patient is a 35 yo M w/ pertinent PMH of HTN, ESRD on HD MWF present to [facility] on 12/12 with increasing SOB.  Patient has not been adherent with BP meds and missed 1 session of dialysis last week.  Patient has had history of pleural effusions in 01/2020 and 11/2020 thoracentesis showed exudative process.  On 12/12 patient develops progressive SOB and was admitted to [facility].  CXR showed bilateral pleural effusions L >R and questionable pneumonia.  Patient mildly hypoxic on 2 L [place].  Patient very hypertensive.  Thoracentesis performed 12/12 and pleural studies indicate transudative process.  On 12/14 left sided pigtail chest tube placed with 850 cc output in patient was given tPA through tube.  On 12/15, chest tube halfway out and had IR replaced chest tube.  Patient's pleural effusion still remains loculated and not responding to tPA.  Patient admits being transferred to [facility] from [facility] for CT surgery evaluation and possible VATS.  On 12/16 patient is having some delirium/agitation and pulling at lines.  Was started on Precedex and given Ativan.  PCCM consulted for ICU admission  Pertinent  Medical History   Past Medical History:  Diagnosis Date   ESRD on hemodialysis ([place])    Hypertension      Significant Hospital Events: Including procedures, antibiotic start and stop dates in addition to other pertinent events   12/12: Admitted to [facility] for pleural effusion 12/17: transferred to [facility] from [facility] for loculated Left pleural effusion and VATS evaluation from CT surgery  Interim History / Subjective:  Patient is lethargic and not responding to painful stimuli Patient is on 2 L [place] sats 100% On precedex L chest tube in place  Objective   There were no vitals taken for this  visit.       No intake or output data in the 24 hours ending 03/11/21 0458 There were no vitals filed for this visit.  Examination: General:  lethargic, ill appearing male HEENT: MM pink/moist; [place] in place Neuro: lethargic; Pupil 2 mm minimal reactive to light; no response to painful stimuli CV: s1s2, no m/r/g PULM:  dim clear BS bilaterally; [place] 2L; L chest tube with bloody drainage GI: soft, bsx4 active  Extremities: warm/dry, no edema; L forearm fistula present Skin: no rashes or lesions appreciated   Resolved Hospital Problem list     Assessment & Plan:  Acute respiratory failure with hypoxia Bilateral pleural effusions Loculated left pleural effusion P: -Transfer from [facility] to [facility] for possible CT surgery eval and VATS procedure -may need ct surgery consult upon arrival to [facility] -Patient did not respond well to pleural thrombolytic therapy -continue chest tube - -20 suction; monitor output -wean oxygen for sats >92% -check cxr -continue HD as tolerated  HFpEF: Echo 12/15: Grade 2 DD; EF 60 to 65% P: -Continue volume removal with dialysis -Strict I/O's and daily weights -Vascular surgery consulted to assess fistula for high output heart failure  CAP P: -Continue Unasyn -Pulm toiletry: IS and flutter  Hypertensive urgency P: -Continue dialysis -Continue antihypertensives  ESRD on HD MWF P: -HD per nephro -may need nephro consult upon transfer to [facility] -Trend BMP / urinary output -Replace electrolytes as indicated -Avoid nephrotoxic agents, ensure adequate renal perfusion  Stress reaction causing mixed disturbance of emotion  and conduct vs. Agitation delirium P: -Psych consulted -started on precedex drip and given ativan prior to arrival -patient lethargic; will hold sedating medications  Acute on chronic anemia in ESRD P: -EPO with dialysis per nephro -Trend CBC -transfuse per protocol  Tobacco abuse P: -Education on smoking cessation given  Best  Practice (right click and "Reselect all SmartList Selections" daily)   Diet/type: NPO DVT prophylaxis: prophylactic heparin  GI prophylaxis: PPI Lines: N/A Foley:  N/A Code Status:  full code Last date of multidisciplinary goals of care discussion [pending]  Labs   CBC: Recent Labs  Lab 03/06/21 0643 03/07/21 0624 03/08/21 0930 03/09/21 1319 03/10/21 1541  WBC 9.5 8.6 7.2 10.5 7.9  NEUTROABS 7.3  --   --   --   --   HGB 6.9* 7.2* 6.9* 7.2* 6.5*  HCT 21.6* 21.3* 21.1* 22.0* 20.2*  MCV 91.5 89.1 90.2 91.7 89.8  PLT 213 222 215 230 700    Basic Metabolic Panel: Recent Labs  Lab 03/06/21 0643 03/07/21 0624 03/08/21 0930 03/09/21 1319 03/10/21 1541  NA 133* 135 134* 135 133*  K 4.7 5.1 3.8 4.7 5.3*  CL 96* 98 95* 93* 91*  CO2 23 28 26 31 31   GLUCOSE 137* 94 86 127* 97  BUN 74* 47* 41* 49* 49*  CREATININE 18.63* 12.44* 9.65* 12.44* 15.69*  CALCIUM 8.8* 8.4* 8.0* 8.3* 8.2*  PHOS  --   --  3.8  --  8.0*   GFR: Estimated Creatinine Clearance: 7.3 mL/min (A) (by C-G formula based on SCr of 15.69 mg/dL (H)). Recent Labs  Lab 03/07/21 0624 03/08/21 0930 03/09/21 1319 03/10/21 1541  WBC 8.6 7.2 10.5 7.9    Liver Function Tests: Recent Labs  Lab 03/06/21 0643 03/08/21 0930 03/10/21 1541  AST 18  --   --   ALT 10  --   --   ALKPHOS 57  --   --   BILITOT 0.9  --   --   PROT 8.9*  --   --   ALBUMIN 3.8 3.0* 3.1*   No results for input(s): LIPASE, AMYLASE in the last 168 hours. No results for input(s): AMMONIA in the last 168 hours.  ABG    Component Value Date/Time   HCO3 31.2 (H) 11/30/2020 1002   ACIDBASEDEF 4.7 (H) 09/16/2020 0609   O2SAT 79.2 11/30/2020 1002     Coagulation Profile: Recent Labs  Lab 03/07/21 0624  INR 1.2    Cardiac Enzymes: No results for input(s): CKTOTAL, CKMB, CKMBINDEX, TROPONINI in the last 168 hours.  HbA1C: No results found for: HGBA1C  CBG: No results for input(s): GLUCAP in the last 168 hours.  Review of  Systems:   Patient lethargic. Unable to obtain  Past Medical History:  He,  has a past medical history of ESRD on hemodialysis ([place]) and Hypertension.   Surgical History:   Past Surgical History:  Procedure Laterality Date   A/V FISTULAGRAM Left 03/09/2021   Procedure: A/V Fistulagram;  Surgeon: Algernon Huxley, MD;  Location: [facility];  Service: Cardiovascular;  Laterality: Left;   AV FISTULA PLACEMENT Left    IR CATHETER TUBE CHANGE  03/09/2021     Social History:   reports that he has quit smoking. His smoking use included cigarettes. He has never used smokeless tobacco. He reports that he does not currently use alcohol. He reports that he does not use drugs.   Family History:  His family history includes Diabetes Mellitus II  in his mother; Hypertension in his mother.   Allergies No Known Allergies   Home Medications  Prior to Admission medications   Medication Sig Start Date End Date Taking? Authorizing Provider  acetaminophen (TYLENOL) 325 MG tablet Take 2 tablets (650 mg total) by mouth every 6 (six) hours as needed for mild pain (or Fever >/= 101). 03/10/21   Nicole Kindred A, DO  amLODipine (NORVASC) 10 MG tablet Take 1 tablet (10 mg total) by mouth daily. 12/02/20   Sharen Hones, MD  Ampicillin-Sulbactam 3 g in sodium chloride 0.9 % 100 mL Inject 3 g into the vein every 12 (twelve) hours. 03/10/21   Ezekiel Slocumb, DO  calcium acetate (PHOSLO) 667 MG capsule Take 2 capsules (1,334 mg total) by mouth 3 (three) times daily with meals. 03/10/21   Ezekiel Slocumb, DO  carvedilol (COREG) 12.5 MG tablet Take 1 tablet (12.5 mg total) by mouth 2 (two) times daily with a meal. 03/10/21   Ezekiel Slocumb, DO  Chlorhexidine Gluconate Cloth 2 % PADS Apply 6 each topically daily at 6 (six) AM. 03/11/21   Nicole Kindred A, DO  dexmedetomidine (PRECEDEX) 400 MCG/100ML SOLN Inject 34.4-103.2 mcg/hr into the vein continuous. 03/10/21   Ezekiel Slocumb, DO  epoetin alfa  (EPOGEN) 10000 UNIT/ML injection Inject 0.4 mLs (4,000 Units total) into the vein every Monday, Wednesday, and Friday with hemodialysis. 03/13/21   Ezekiel Slocumb, DO  furosemide (LASIX) 80 MG tablet Take 80 mg by mouth daily.    [provider]  hydrALAZINE (APRESOLINE) 25 MG tablet Take 1 tablet (25 mg total) by mouth every 8 (eight) hours. 03/10/21   Ezekiel Slocumb, DO  HYDROcodone-acetaminophen (NORCO/VICODIN) 5-325 MG tablet Take 1-2 tablets by mouth every 4 (four) hours as needed for moderate pain. 03/10/21   Ezekiel Slocumb, DO  hydrOXYzine (ATARAX) 10 MG tablet Take 1 tablet (10 mg total) by mouth 3 (three) times daily as needed for anxiety. 03/10/21   Ezekiel Slocumb, DO  LORazepam (ATIVAN) 0.5 MG tablet Take 1 tablet (0.5 mg total) by mouth once as needed (refractory anxiety). 03/10/21   Ezekiel Slocumb, DO  losartan (COZAAR) 100 MG tablet Take 1 tablet (100 mg total) by mouth daily. 12/02/20   Sharen Hones, MD  minoxidil (LONITEN) 2.5 MG tablet Take 2 tablets (5 mg total) by mouth daily. 12/03/20   Sharen Hones, MD  ondansetron (ZOFRAN) 4 MG tablet Take 1 tablet (4 mg total) by mouth every 6 (six) hours as needed for nausea. 03/10/21   Nicole Kindred A, DO  sodium chloride 0.9 % infusion Inject 250 mLs into the vein as needed (for IV line care  (Saline / Heparin Lock)). 03/10/21   Nicole Kindred A, DO  sodium chloride flush (NS) 0.9 % SOLN Inject 3 mLs into the vein every 12 (twelve) hours. 03/10/21   Ezekiel Slocumb, DO     Critical care time: 45 minutes     JD Rexene Agent [place] [facility] 03/11/2021, 4:58 AM  Please see [email or web address] for pager details.  From 7A-7P if no response, please call [phone]. After hours, please call ELink [phone].

## 2021-03-11 NOTE — Consult Note (Signed)
[address]       [place] [address]             [phone]      Cardiothoracic Surgery Consultation  Reason for Consult: Loculated left pleural effusion Referring Physician: Dr. Javier Docker is an 35 y.o. male.  HPI:   The patient is a 35 year old gentleman with a history of end-stage renal disease on hemodialysis, hypertension, and medical noncompliance who has had problems with pleural effusions dating back to at least June 2022 when a chest x-ray showed moderate bilateral pleural effusions and pulmonary edema.  He had enlargement of the pleural effusions and subsequently underwent a left thoracentesis removing 1.2 L of old bloody pleural fluid on 11/30/2020 and then right thoracentesis on 12/01/2020 removing about 1.2 L of amber serous fluid.  He presented on 03/06/2021 with progressive shortness of breath and was admitted to [facility].  Chest x-ray showed bilateral pleural effusions left greater than right with questionable pneumonia.  He had a left thoracentesis performed on 03/06/2021 removing 750 cc of amber-colored fluid with studies on the fluid suggesting a transudate of process on 03/08/2021 he had a pigtail catheter placed in the left chest with 850 cc of output.  He was given tPA through the tube.  On 03/09/2021 the tube had to be replaced because became dislodged and partially pulled out.  He was transferred to [facility] today for CT surgery evaluation.  He has been afebrile with no signs of sepsis.  White blood cell count is 8.4.  Pleural fluid cultures have been negative.  His hospital course been complicated by acute metabolic encephalopathy.  He has also had hypertensive emergency.  Past Medical History:  Diagnosis Date   ESRD on hemodialysis [facility])    Hypertension     Past Surgical History:  Procedure Laterality Date   A/V FISTULAGRAM Left 03/09/2021   Procedure: A/V Fistulagram;  Surgeon: Algernon Huxley, MD;  Location: [place] [facility];   Service: Cardiovascular;  Laterality: Left;   AV FISTULA PLACEMENT Left    IR CATHETER TUBE CHANGE  03/09/2021    Family History  Problem Relation Age of Onset   Hypertension Mother    Diabetes Mellitus II Mother     Social History:  reports that he has quit smoking. His smoking use included cigarettes. He has never used smokeless tobacco. He reports that he does not currently use alcohol. He reports that he does not use drugs.  Allergies: No Known Allergies  Medications: I have reviewed the patient's current medications. Prior to Admission:  Medications Prior to Admission  Medication Sig Dispense Refill Last Dose   acetaminophen (TYLENOL) 325 MG tablet Take 2 tablets (650 mg total) by mouth every 6 (six) hours as needed for mild pain (or Fever >/= 101).      amLODipine (NORVASC) 10 MG tablet Take 1 tablet (10 mg total) by mouth daily. 30 tablet 0    Ampicillin-Sulbactam 3 g in sodium chloride 0.9 % 100 mL Inject 3 g into the vein every 12 (twelve) hours.      calcium acetate (PHOSLO) 667 MG capsule Take 2 capsules (1,334 mg total) by mouth 3 (three) times daily with meals.      carvedilol (COREG) 12.5 MG tablet Take 1 tablet (12.5 mg total) by mouth 2 (two) times daily with a meal.      Chlorhexidine Gluconate Cloth 2 % PADS Apply 6 each topically daily at 6 (six)  AM.      dexmedetomidine (PRECEDEX) 400 MCG/100ML SOLN Inject 34.4-103.2 mcg/hr into the vein continuous.      [START ON 03/13/2021] epoetin alfa (EPOGEN) 10000 UNIT/ML injection Inject 0.4 mLs (4,000 Units total) into the vein every Monday, Wednesday, and Friday with hemodialysis. 1 mL     furosemide (LASIX) 80 MG tablet Take 80 mg by mouth daily.      hydrALAZINE (APRESOLINE) 25 MG tablet Take 1 tablet (25 mg total) by mouth every 8 (eight) hours.      HYDROcodone-acetaminophen (NORCO/VICODIN) 5-325 MG tablet Take 1-2 tablets by mouth every 4 (four) hours as needed for moderate pain. 30 tablet 0    hydrOXYzine (ATARAX) 10  MG tablet Take 1 tablet (10 mg total) by mouth 3 (three) times daily as needed for anxiety. 30 tablet 0    LORazepam (ATIVAN) 0.5 MG tablet Take 1 tablet (0.5 mg total) by mouth once as needed (refractory anxiety). 30 tablet 0    losartan (COZAAR) 100 MG tablet Take 1 tablet (100 mg total) by mouth daily. 60 tablet 0    minoxidil (LONITEN) 2.5 MG tablet Take 2 tablets (5 mg total) by mouth daily. 30 tablet 0    ondansetron (ZOFRAN) 4 MG tablet Take 1 tablet (4 mg total) by mouth every 6 (six) hours as needed for nausea. 20 tablet 0    sodium chloride 0.9 % infusion Inject 250 mLs into the vein as needed (for IV line care  (Saline / Heparin Lock)).  0    sodium chloride flush (NS) 0.9 % SOLN Inject 3 mLs into the vein every 12 (twelve) hours.      Scheduled:  amLODipine  10 mg Oral Daily   carvedilol  25 mg Oral BID WC   Chlorhexidine Gluconate Cloth  6 each Topical Q0600   heparin  5,000 Units Subcutaneous Q8H   hydrALAZINE  100 mg Oral Q8H   mouth rinse  15 mL Mouth Rinse BID   metoCLOPramide (REGLAN) injection  10 mg Intravenous Q6H   minoxidil  5 mg Oral Daily   Continuous:  ampicillin-sulbactam (UNASYN) IV 3 g (03/11/21 1043)   CZY:SAYTKZSW sodium, labetalol, ondansetron (ZOFRAN) IV, polyethylene glycol  Results for orders placed or performed during the hospital encounter of 03/11/21 (from the past 48 hour(s))  MRSA Next Gen by PCR, Nasal     Status: None   Collection Time: 03/11/21  5:09 AM   Specimen: Nasal Mucosa; Nasal Swab  Result Value Ref Range   MRSA by PCR Next Gen NOT DETECTED NOT DETECTED    Comment: (NOTE) The GeneXpert MRSA Assay (FDA approved for NASAL specimens only), is one component of a comprehensive MRSA colonization surveillance program. It is not intended to diagnose MRSA infection nor to guide or monitor treatment for MRSA infections. Test performance is not FDA approved in patients less than 28 years old. Performed at [facility], [place] [address], [place], [place] [address]   CBC     Status: Abnormal   Collection Time: 03/11/21  6:47 AM  Result Value Ref Range   WBC 8.4 4.0 - 10.5 K/uL   RBC 2.42 (L) 4.22 - 5.81 MIL/uL   Hemoglobin 7.0 (L) 13.0 - 17.0 g/dL   HCT 22.2 (L) 39.0 - 52.0 %   MCV 91.7 80.0 - 100.0 fL   MCH 28.9 26.0 - 34.0 pg   MCHC 31.5 30.0 - 36.0 g/dL   RDW 14.2 11.5 - 15.5 %   Platelets  217 150 - 400 K/uL   nRBC 0.0 0.0 - 0.2 %    Comment: Performed at [facility], [place] [address] [place], [place] [address]  Basic metabolic panel     Status: Abnormal   Collection Time: 03/11/21  6:47 AM  Result Value Ref Range   Sodium 137 135 - 145 mmol/L   Potassium 5.1 3.5 - 5.1 mmol/L   Chloride 96 (L) 98 - 111 mmol/L   CO2 27 22 - 32 mmol/L   Glucose, Bld 109 (H) 70 - 99 mg/dL    Comment: Glucose reference range applies only to samples taken after fasting for at least 8 hours.   BUN 26 (H) 6 - 20 mg/dL   Creatinine, Ser 10.63 (H) 0.61 - 1.24 mg/dL   Calcium 8.8 (L) 8.9 - 10.3 mg/dL   GFR, Estimated 6 (L) >60 mL/min    Comment: (NOTE) Calculated using the CKD-EPI Creatinine Equation (2021)    Anion gap 14 5 - 15    Comment: Performed at [facility] [address] [place], [place] [address]  Type and screen [place]     Status: None   Collection Time: 03/11/21  8:25 AM  Result Value Ref Range   ABO/RH(D) O POS    Antibody Screen NEG    Sample Expiration      03/14/2021,2359 Performed at [facility], [place] [address] [place], [place] [address]     IR Catheter Tube Change  Result Date: 03/09/2021 CLINICAL DATA:  Inadvertent partial retraction of left-sided chest tube. Please perform fluoroscopic guided exchange, up sizing and repositioning. EXAM: IR CATHETER TUBE CHANGE COMPARISON:  CT-guided left-sided chest tube placement-03/08/2021 Chest radiograph-earlier same day CONTRAST:  None MEDICATIONS: None. ANESTHESIA/SEDATION: None FLUOROSCOPY TIME:  18 seconds (4.2 mGy)  TECHNIQUE: Patient was positioned supine, slightly RPO on the fluoroscopy table. The external portion of the existing left-sided chest tube as well as the surrounding skin was prepped and draped in usual sterile fashion. A preprocedural spot fluoroscopic image was obtained of the existing percutaneous drainage catheter. A small amount of contrast was injected via the existing percutaneous drainage catheter and several fluoroscopic images were obtained in various obliquities. The external portion of the chest tube was cut and cannulated with a short Amplatz wire. Under intermittent fluoroscopic guidance, the existing 14 French chest tube was exchanged for a new 37 French percutaneous drainage catheter with end coiled and locked within the more superior aspect of the left hemithorax. The new left-sided chest tube was connected to a pleura vac device and secured in place with 2 interrupted sutures and a StatLock device. A Vaseline gauze dressing was applied. The patient tolerated the procedure well without immediate postprocedural complication. FINDINGS: Preprocedural spot fluoroscopic image demonstrates partial retraction of recently placed left-sided chest tube. After fluoroscopic guided exchange, repositioning and up sizing, the new 16 French chest tube is more ideally positioned within the more cranial aspect of the left pleural space. IMPRESSION: Successful fluoroscopic guided exchange, up sizing and repositioning of now 16 French left-sided chest tube. PLAN: - Continued chest tube management as per the critical care team. Electronically Signed   By: Sandi Mariscal M.D.   On: 03/09/2021 17:00   DG CHEST PORT 1 VIEW  Result Date: 03/11/2021 CLINICAL DATA:  Loculated pleural effusion which chest tube EXAM: PORTABLE CHEST 1 VIEW COMPARISON:  Yesterday FINDINGS: Complex left pleural effusion with apical and basal pneumothorax outlining pleural thickening. Cardiomegaly and hazy  right chest from pleural fluid and  atelectasis. Dialysis catheter on the right with tip at the upper cavoatrial junction. IMPRESSION: Loculated left pneumothorax with increased apical component since yesterday. Elsewhere stable chest and hardware positioning. Electronically Signed   By: Jorje Guild M.D.   On: 03/11/2021 06:16   DG Chest Port 1 View  Result Date: 03/10/2021 CLINICAL DATA:  Hypoxia. Recent chest tube placement for left-sided pneumothorax. EXAM: PORTABLE CHEST 1 VIEW COMPARISON:  Radiographs 03/09/2021 and 03/07/2021.  CT 03/06/2021. FINDINGS: 0921 hours. The recently upsized left chest tube is better positioned, overlying the medial left chest. The loculated left hydropneumothorax appears slightly smaller. Underlying small bilateral pleural effusions and bilateral airspace opacities are unchanged. The heart size and mediastinal contours are stable. Right IJ central venous catheter projects to the superior cavoatrial junction. IMPRESSION: Improved position of small left-sided chest tube with slight improvement in loculated hydropneumothorax at the left costophrenic angle. No other significant changes. Electronically Signed   By: Richardean Sale M.D.   On: 03/10/2021 09:39    Review of Systems  Constitutional:  Negative for chills and fever.  Respiratory:  Positive for cough and shortness of breath.   Cardiovascular:  Positive for chest pain.  Blood pressure (!) 132/101, pulse 76, temperature 98.3 F (36.8 C), temperature source Axillary, resp. rate (!) 23, SpO2 95 %. Physical Exam Constitutional:      Appearance: Normal appearance.  HENT:     Head: Normocephalic and atraumatic.  Eyes:     Extraocular Movements: Extraocular movements intact.     Pupils: Pupils are equal, round, and reactive to light.  Cardiovascular:     Rate and Rhythm: Normal rate and regular rhythm.     Heart sounds: Normal heart sounds. No murmur heard. Pulmonary:     Effort: Pulmonary effort is normal.     Comments: Decreased breath  sounds over the left lung Musculoskeletal:     Cervical back: Normal range of motion and neck supple. No tenderness.  Neurological:     Mental Status: He is alert.   Narrative & Impression  CLINICAL DATA:  Pneumonia, left pleural effusion and status post left thoracentesis with removal of 750 mL of fluid. Post thoracentesis chest x-ray shows a small amount new air in the pleural space towards the apex.   EXAM: CT CHEST WITHOUT CONTRAST   TECHNIQUE: Multidetector CT imaging of the chest was performed following the standard protocol without IV contrast.   COMPARISON:  Chest x-ray earlier today.   FINDINGS: Cardiovascular: The heart size is within normal limits. No pericardial fluid identified. The thoracic aorta and central pulmonary arteries are normal in caliber. A tunneled dialysis catheter is present inserted via the lower right internal jugular vein and extending to the SVC/RA junction.   Mediastinum/Nodes: Multiple scattered mediastinal lymph nodes are visualized. The largest in the right lower paratracheal region measure approximately 12 mm in maximum short axis diameter. Lymph nodes are most likely reactive given acute pulmonary findings. The thyroid gland appears unremarkable.   Lungs/Pleura: There is consolidation of the left lower lobe likely consistent with acute pneumonia. Associated loculated parapneumonic pleural fluid on the left may be consistent with empyema. A small amount of air is present at the apex posteriorly in the pleural space likely introduced at the time of the thoracentesis procedure. This does not represent significant pneumothorax.   There is a small right pleural effusion. Potential component of subtle pneumonia at the right lung base.   Upper Abdomen: No acute abnormality.  Musculoskeletal: No chest wall mass or suspicious bone lesions identified.   IMPRESSION: 1. Left lower lobe consolidation consistent with acute  pneumonia. Associated loculated parapneumonic pleural fluid may be consistent with developing empyema. A small amount of pleural air at the apex may have been introduced at the time of the thoracentesis and does not represent a significant pneumothorax. 2. Small right pleural effusion with possible component of subtle pneumonia at the right lung base. 3. Multiple scattered small mediastinal lymph nodes which may be reactive given pulmonary findings.     Electronically Signed   By: Aletta Edouard M.D.   On: 03/06/2021 10:02    Assessment/Plan:  This 35 year old dialysis patient has a complex multiloculated left pleural effusion of several months duration treated with thoracentesis and pigtail catheter insertion for tPA.  The etiology of this effusion may be parapneumonic or congestive heart failure.  The lung appears encased in a thick peel and is most likely trapped by scar tissue.  Despite draining a significant amount of fluid with a pigtail catheter the left lung has not expanded to fill the space which accounts for the lateral pneumothorax.  Given the chronicity of this problem I do not think it is feasible to perform a left thoracotomy to try to drain any residual fluid and decorticate the lung.  This would likely result in significant injury to the lung with prolonged airleak and I do not think that the left lung is going to reexpand.  He would likely end up with a chronic chest tube draining persistent air leaks and would be worse off than he was now.  I would keep the current pigtail catheter in until it stops draining and reevaluate his chest CT for any other significant collections that could be drained percutaneously.  Open surgical treatment of these types of problems needs to occur in the first few weeks to have any chance of reexpanding the lung.  I discussed all this with the patient and his mother by telephone.  Gaye Pollack 03/11/2021, 3:47 PM

## 2021-03-11 NOTE — Progress Notes (Signed)
Pharmacy Antibiotic Note  Travis Long is a 35 y.o. male admitted on 03/11/2021 with pneumonia, loculated effusion.  Pharmacy has been consulted for Unasyn dosing. WBC 7.9. ESRD on HD.   Plan: Unasyn 3g IV q12h Trend WBC, temp, cultures    Temp (24hrs), Avg:97.9 F (36.6 C), Min:96.6 F (35.9 C), Max:99.6 F (37.6 C)  Recent Labs  Lab 03/06/21 0643 03/07/21 0624 03/08/21 0930 03/09/21 1319 03/10/21 1541  WBC 9.5 8.6 7.2 10.5 7.9  CREATININE 18.63* 12.44* 9.65* 12.44* 15.69*    Estimated Creatinine Clearance: 7.3 mL/min (A) (by C-G formula based on SCr of 15.69 mg/dL (H)).    No Known Allergies  Narda Bonds, PharmD, BCPS Clinical Pharmacist Phone: [phone]

## 2021-03-12 ENCOUNTER — Inpatient Hospital Stay (HOSPITAL_COMMUNITY): Payer: Medicare Other

## 2021-03-12 MED ORDER — ACETAMINOPHEN 325 MG PO TABS
650.0000 mg | ORAL_TABLET | ORAL | Status: DC | PRN
  Administered 2021-03-13: 01:00:00 650 mg via ORAL
  Filled 2021-03-12: qty 2

## 2021-03-12 MED ORDER — DICYCLOMINE HCL 10 MG PO CAPS
10.0000 mg | ORAL_CAPSULE | Freq: Four times a day (QID) | ORAL | Status: DC | PRN
  Administered 2021-03-12: 18:00:00 10 mg via ORAL
  Filled 2021-03-12 (×2): qty 1

## 2021-03-12 MED ORDER — OXYCODONE HCL 5 MG PO TABS
5.0000 mg | ORAL_TABLET | ORAL | Status: DC | PRN
  Administered 2021-03-12 – 2021-03-15 (×11): 5 mg via ORAL
  Filled 2021-03-12 (×12): qty 1

## 2021-03-12 NOTE — Progress Notes (Signed)
Sitter at bedside assisting with bed bath. Pt refused to be completed by this RN. Refused lab blood draw.

## 2021-03-12 NOTE — Progress Notes (Signed)
Pt refused SQ Heparin and IV Reglan. Used bedside commode to have bowel movement. Denies complaints at this time.

## 2021-03-12 NOTE — Progress Notes (Signed)
°  Transition of Care [facility]) Screening Note   Patient Details  Name: Travis Long Date of Birth: Sep 24, 1985   Transition of Care [facility]) CM/SW Contact:    Alfredia Ferguson, LCSW Phone Number: 03/12/2021, 12:05 PM    Transition of Care [facility]) has reviewed patient and notes the possible need for [facility] assistance pending IVC status and continued medical work-up. We will continue to monitor patient advancement through interdisciplinary progression rounds for any updates to clinical status and needs.

## 2021-03-12 NOTE — Progress Notes (Signed)
NAME:  Travis Long, MRN:  [id number], DOB:  [birth year], LOS: 1 ADMISSION DATE:  03/11/2021, CONSULTATION DATE:  12/17 REFERRING MD:  Dr. Arbutus Ped, CHIEF COMPLAINT:  Loculated pleural effusion   History of Present Illness:  Patient is a 35 yo M w/ pertinent PMH of HTN, ESRD on HD MWF present to [facility] on 12/12 with increasing SOB.  Patient has not been adherent with BP meds and missed 1 session of dialysis last week.  Patient has had history of pleural effusions in 01/2020 and 11/2020 thoracentesis showed exudative process.  On 12/12 patient develops progressive SOB and was admitted to [facility].  CXR showed bilateral pleural effusions L >R and questionable pneumonia.  Patient mildly hypoxic on 2 L [place].  Patient very hypertensive.  Thoracentesis performed 12/12 and pleural studies indicate transudative process.  On 12/14 left sided pigtail chest tube placed with 850 cc output in patient was given tPA through tube.  On 12/15, chest tube halfway out and had IR replaced chest tube.  Patient's pleural effusion still remains loculated and not responding to tPA.  Patient admits being transferred to [facility] from [facility] for CT surgery evaluation and possible VATS.  On 12/16 patient is having some delirium/agitation and pulling at lines.  Was started on Precedex and given Ativan.  PCCM consulted for ICU admission  Pertinent  Medical History   Past Medical History:  Diagnosis Date   ESRD on hemodialysis ([place])    Hypertension      Significant Hospital Events: Including procedures, antibiotic start and stop dates in addition to other pertinent events   12/12: Admitted to [facility] for pleural effusion 12/17: transferred to [facility] from [facility] for loculated Left pleural effusion and VATS evaluation from CT surgery 12/18: Patient was seen by CT surgery, recommend no surgical intervention, continue chest tube drainage  Interim History / Subjective:  No overnight issues patient is alert, awake He remained afebrile Objective    Blood pressure (!) 149/91, pulse 81, temperature 98.3 F (36.8 C), temperature source Oral, resp. rate 16, SpO2 90 %.        Intake/Output Summary (Last 24 hours) at 03/12/2021 0951 Last data filed at 03/12/2021 0818 Gross per 24 hour  Intake 1040.66 ml  Output 671 ml  Net 369.66 ml   There were no vitals filed for this visit.  Examination: Physical exam: General: Acutely ill-appearing young African-American male, lying on the bed HEENT: [employer]/AT, eyes anicteric.  moist mucus membranes Neuro: Alert, awake, following commands Chest: Absent air entry in left middle and lower lung zone, no wheezes or rhonchi Heart: Regular rate and rhythm, no murmurs or gallops Abdomen: Soft, nontender, nondistended, bowel sounds present Skin: No rash    Resolved Hospital Problem list     Assessment & Plan:  Acute respiratory failure with hypoxia Recurrent bilateral pleural effusions, left more than right Loculated left pleural effusion Continue titrate nasal cannula oxygen with O2 sat goal >90% Closely monitor chest tube output Chest tube output was 630 cc in last 24 hours TCTS consulted, recommend no surgical intervention, continue chest tube drainage Continue chest tube - -20 suction bleeding  Chronic HFpEF: Echo 12/15: Grade 2 DD; EF 60 to 65% Patient looks euvolemic Strict I/O's and daily weights  CAP Patient remained afebrile Continue Unasyn to complete 7-day therapy  Acute metabolic encephalopathy Mental status has improved He is off Precedex for 24 hours  Hypertensive urgency Patient blood pressure is better controlled now  Continue Coreg hydralazine and amlodipine Continue minoxidil for resistant  hypertension  ESRD on HD MWF HD per nephro Replace electrolytes as indicated Avoid nephrotoxic agents, ensure adequate renal perfusion  Acute on 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  Labs   CBC: Recent Labs  Lab 03/06/21 0643 03/07/21 0624 03/08/21 0930 03/09/21 1319 03/10/21 1541 03/11/21 0647  WBC 9.5 8.6 7.2 10.5 7.9 8.4  NEUTROABS 7.3  --   --   --   --   --   HGB 6.9* 7.2* 6.9* 7.2* 6.5* 7.0*  HCT 21.6* 21.3* 21.1* 22.0* 20.2* 22.2*  MCV 91.5 89.1 90.2 91.7 89.8 91.7  PLT 213 222 215 230 204 245    Basic Metabolic Panel: Recent Labs  Lab 03/07/21 0624 03/08/21 0930 03/09/21 1319 03/10/21 1541 03/11/21 0647  NA 135 134* 135 133* 137  K 5.1 3.8 4.7 5.3* 5.1  CL 98 95* 93* 91* 96*  CO2 28 26 31 31 27   GLUCOSE 94 86 127* 97 109*  BUN 47* 41* 49* 49* 26*  CREATININE 12.44* 9.65* 12.44* 15.69* 10.63*  CALCIUM 8.4* 8.0* 8.3* 8.2* 8.8*  PHOS  --  3.8  --  8.0*  --    GFR: Estimated Creatinine Clearance: 10.7 mL/min (A) (by C-G formula based on SCr of 10.63 mg/dL (H)). Recent Labs  Lab 03/08/21 0930 03/09/21 1319 03/10/21 1541 03/11/21 0647  WBC 7.2 10.5 7.9 8.4    Liver Function Tests: Recent Labs  Lab 03/06/21 0643 03/08/21 0930 03/10/21 1541  AST 18  --   --   ALT 10  --   --   ALKPHOS 57  --   --   BILITOT 0.9  --   --   PROT 8.9*  --   --   ALBUMIN 3.8 3.0* 3.1*   No results for input(s): LIPASE, AMYLASE in the last 168 hours. No results for input(s): AMMONIA in the last 168 hours.  ABG    Component Value Date/Time   HCO3 31.2 (H) 11/30/2020 1002   ACIDBASEDEF 4.7 (H) 09/16/2020 0609   O2SAT 79.2 11/30/2020 1002     Coagulation  Profile: Recent Labs  Lab 03/07/21 0624  INR 1.2    Cardiac Enzymes: No results for input(s): CKTOTAL, CKMB, CKMBINDEX, TROPONINI in the last 168 hours.  HbA1C: No results found for: HGBA1C  CBG: No results for input(s): GLUCAP in the last 168 hours.     Jacky Kindle MD [facility] See Amion for pager If no response to pager, please call [phone] until 7pm After 7pm, Please call E-link [phone]

## 2021-03-12 NOTE — Progress Notes (Signed)
[facility] Progress Note Patient Name: Travis Long DOB: 15-Jul-1985 MRN: [id number]   Date of Service  03/12/2021  HPI/Events of Note  Chest tube output for shift ~550cc  eICU Interventions  Continue 20cm suction     Intervention Category Minor Interventions: Other:  Tilden Dome 03/12/2021, 4:31 AM

## 2021-03-12 NOTE — Progress Notes (Signed)
Pt eating breakfast tray. Pt denied complaints at this time.

## 2021-03-13 DIAGNOSIS — J9601 Acute respiratory failure with hypoxia: Secondary | ICD-10-CM

## 2021-03-13 DIAGNOSIS — F4320 Adjustment disorder, unspecified: Secondary | ICD-10-CM

## 2021-03-13 LAB — RENAL FUNCTION PANEL
Albumin: 3.4 g/dL — ABNORMAL LOW (ref 3.5–5.0)
Anion gap: 12 (ref 5–15)
BUN: 16 mg/dL (ref 6–20)
CO2: 31 mmol/L (ref 22–32)
Calcium: 8.9 mg/dL (ref 8.9–10.3)
Chloride: 91 mmol/L — ABNORMAL LOW (ref 98–111)
Creatinine, Ser: 8.74 mg/dL — ABNORMAL HIGH (ref 0.61–1.24)
GFR, Estimated: 8 mL/min — ABNORMAL LOW (ref >60)
Glucose, Bld: 154 mg/dL — ABNORMAL HIGH (ref 70–99)
Phosphorus: 4 mg/dL (ref 2.5–4.6)
Potassium: 3.5 mmol/L (ref 3.5–5.1)
Sodium: 134 mmol/L — ABNORMAL LOW (ref 135–145)

## 2021-03-13 LAB — BASIC METABOLIC PANEL
Anion gap: 17 — ABNORMAL HIGH (ref 5–15)
BUN: 42 mg/dL — ABNORMAL HIGH (ref 6–20)
CO2: 34 mmol/L — ABNORMAL HIGH (ref 22–32)
Calcium: 7.8 mg/dL — ABNORMAL LOW (ref 8.9–10.3)
Chloride: 85 mmol/L — ABNORMAL LOW (ref 98–111)
Creatinine, Ser: 15.82 mg/dL — ABNORMAL HIGH (ref 0.61–1.24)
GFR, Estimated: 4 mL/min — ABNORMAL LOW (ref >60)
Glucose, Bld: 161 mg/dL — ABNORMAL HIGH (ref 70–99)
Potassium: 3.8 mmol/L (ref 3.5–5.1)
Sodium: 136 mmol/L (ref 135–145)

## 2021-03-13 LAB — CBC WITH DIFFERENTIAL/PLATELET
Abs Immature Granulocytes: 0.04 10*3/uL (ref 0.00–0.07)
Basophils Absolute: 0 10*3/uL (ref 0.0–0.1)
Basophils Relative: 1 %
Eosinophils Absolute: 0.8 10*3/uL — ABNORMAL HIGH (ref 0.0–0.5)
Eosinophils Relative: 9 %
HCT: 22.7 % — ABNORMAL LOW (ref 39.0–52.0)
Hemoglobin: 7.3 g/dL — ABNORMAL LOW (ref 13.0–17.0)
Immature Granulocytes: 1 %
Lymphocytes Relative: 9 %
Lymphs Abs: 0.8 10*3/uL (ref 0.7–4.0)
MCH: 29.7 pg (ref 26.0–34.0)
MCHC: 32.2 g/dL (ref 30.0–36.0)
MCV: 92.3 fL (ref 80.0–100.0)
Monocytes Absolute: 0.8 10*3/uL (ref 0.1–1.0)
Monocytes Relative: 9 %
Neutro Abs: 6.3 10*3/uL (ref 1.7–7.7)
Neutrophils Relative %: 71 %
Platelets: 261 10*3/uL (ref 150–400)
RBC: 2.46 MIL/uL — ABNORMAL LOW (ref 4.22–5.81)
RDW: 15.1 % (ref 11.5–15.5)
WBC: 8.7 10*3/uL (ref 4.0–10.5)
nRBC: 0 % (ref 0.0–0.2)

## 2021-03-13 LAB — CBC
HCT: 29 % — ABNORMAL LOW (ref 39.0–52.0)
Hemoglobin: 9.1 g/dL — ABNORMAL LOW (ref 13.0–17.0)
MCH: 29.4 pg (ref 26.0–34.0)
MCHC: 31.4 g/dL (ref 30.0–36.0)
MCV: 93.5 fL (ref 80.0–100.0)
Platelets: 360 10*3/uL (ref 150–400)
RBC: 3.1 MIL/uL — ABNORMAL LOW (ref 4.22–5.81)
RDW: 15.1 % (ref 11.5–15.5)
WBC: 8.4 10*3/uL (ref 4.0–10.5)
nRBC: 0 % (ref 0.0–0.2)

## 2021-03-13 LAB — HEPATITIS B SURFACE ANTIGEN: Hepatitis B Surface Ag: NONREACTIVE

## 2021-03-13 LAB — HEPATITIS B SURFACE ANTIBODY,QUALITATIVE: Hep B S Ab: REACTIVE — AB

## 2021-03-13 MED ORDER — LIDOCAINE-PRILOCAINE 2.5-2.5 % EX CREA: 1.0000 "application " | TOPICAL_CREAM | CUTANEOUS | Status: DC | PRN

## 2021-03-13 MED ORDER — LIDOCAINE HCL (PF) 1 % IJ SOLN: 5.0000 mL | INTRAMUSCULAR | Status: DC | PRN

## 2021-03-13 MED ORDER — DARBEPOETIN ALFA 100 MCG/0.5ML IJ SOSY
100.0000 ug | PREFILLED_SYRINGE | INTRAMUSCULAR | Status: DC
  Administered 2021-03-13: 100 ug via INTRAVENOUS
  Filled 2021-03-13 (×2): qty 0.5

## 2021-03-13 MED ORDER — HEPARIN SODIUM (PORCINE) 1000 UNIT/ML DIALYSIS: 1000.0000 [IU] | INTRAMUSCULAR | Status: DC | PRN

## 2021-03-13 MED ORDER — PANTOPRAZOLE SODIUM 40 MG IV SOLR
40.0000 mg | Freq: Two times a day (BID) | INTRAVENOUS | Status: DC
  Administered 2021-03-13 – 2021-03-14 (×3): 40 mg via INTRAVENOUS
  Filled 2021-03-13 (×4): qty 40

## 2021-03-13 MED ORDER — ONDANSETRON HCL 4 MG/2ML IJ SOLN
4.0000 mg | Freq: Four times a day (QID) | INTRAMUSCULAR | Status: DC | PRN
  Administered 2021-03-14: 05:00:00 4 mg via INTRAVENOUS
  Filled 2021-03-13: qty 2

## 2021-03-13 MED ORDER — HEPARIN SODIUM (PORCINE) 1000 UNIT/ML IJ SOLN
INTRAMUSCULAR | Status: AC
  Filled 2021-03-13: qty 4

## 2021-03-13 MED ORDER — HEPARIN SODIUM (PORCINE) 1000 UNIT/ML DIALYSIS
20.0000 [IU]/kg | INTRAMUSCULAR | Status: DC | PRN
  Filled 2021-03-13: qty 2

## 2021-03-13 MED ORDER — METOCLOPRAMIDE HCL 5 MG/ML IJ SOLN
10.0000 mg | Freq: Four times a day (QID) | INTRAMUSCULAR | Status: DC | PRN
  Filled 2021-03-13: qty 2

## 2021-03-13 MED ORDER — SODIUM CHLORIDE 0.9 % IV SOLN: 100.0000 mL | INTRAVENOUS | Status: DC | PRN

## 2021-03-13 MED ORDER — CHLORHEXIDINE GLUCONATE CLOTH 2 % EX PADS
6.0000 | MEDICATED_PAD | Freq: Every day | CUTANEOUS | Status: DC
  Administered 2021-03-14 – 2021-03-15 (×2): 6 via TOPICAL

## 2021-03-13 MED ORDER — PENTAFLUOROPROP-TETRAFLUOROETH EX AERO: 1.0000 "application " | INHALATION_SPRAY | CUTANEOUS | Status: DC | PRN

## 2021-03-13 MED ORDER — ALTEPLASE 2 MG IJ SOLR: 2.0000 mg | Freq: Once | INTRAMUSCULAR | Status: DC | PRN

## 2021-03-13 NOTE — Consult Note (Signed)
[facility] Renal Consultation Note    Indication for Consultation:  Management of ESRD/hemodialysis; anemia, hypertension/volume and secondary hyperparathyroidism  HPI: Travis Long is a 35 y.o. male with a PMH significant for poorly controlled HTN, ESRD on HD MWF at [facility], anemia of ESRD, and SHPTH who presented to [facility] ED via EMS on 03/06/21 with a 2 week history of increasing SOB.  Initially described as DOE but eventually became short of breath at rest.  He did miss one HD session last week.  In the ED his BP was 207/129 and given IV labetalol.  CXR revealed large left pleural effusion and underwent US-guided thoracentesis.  He was admitted to the ICU for hypertensive urgency and volume overload.  He underwent chest tube placement on 03/09/21 but pleural effusion remained loculated so was transferred to [facility] ICU on 03/11/21 for CT surgery consultation for possible VATS.  CT surgery did not feel surgical intervention would be appropriate given the chronicity of the problem and likely result in significant injury to the lung with prolonged airleak.  We were consulted to provide HD during his hospitalization.   Past Medical History:  Diagnosis Date   ESRD on hemodialysis [facility])    Hypertension    Past Surgical History:  Procedure Laterality Date   A/V FISTULAGRAM Left 03/09/2021   Procedure: A/V Fistulagram;  Surgeon: Algernon Huxley, MD;  Location: [place] CV LAB;  Service: Cardiovascular;  Laterality: Left;   AV FISTULA PLACEMENT Left    IR CATHETER TUBE CHANGE  03/09/2021   Family History:   Family History  Problem Relation Age of Onset   Hypertension Mother    Diabetes Mellitus II Mother    Social History:  reports that he has quit smoking. His smoking use included cigarettes. He has never used smokeless tobacco. He reports that he does not currently use alcohol. He reports that he does not use drugs. No Known Allergies Prior to Admission medications    Medication Sig Start Date End Date Taking? Authorizing Provider  amLODipine (NORVASC) 10 MG tablet Take 1 tablet (10 mg total) by mouth daily. 12/02/20  Yes Sharen Hones, MD  calcium acetate (PHOSLO) 667 MG capsule Take 2 capsules (1,334 mg total) by mouth 3 (three) times daily with meals. 03/10/21  Yes Nicole Kindred A, DO  carvedilol (COREG) 6.25 MG tablet Take 6.25 mg by mouth 2 (two) times daily with a meal.   Yes [provider]  furosemide (LASIX) 80 MG tablet Take 80 mg by mouth daily.   Yes [provider]  losartan (COZAAR) 100 MG tablet Take 1 tablet (100 mg total) by mouth daily. 12/02/20  Yes Sharen Hones, MD  minoxidil (LONITEN) 2.5 MG tablet Take 2 tablets (5 mg total) by mouth daily. 12/03/20  Yes Sharen Hones, MD  epoetin alfa (EPOGEN) 10000 UNIT/ML injection Inject 0.4 mLs (4,000 Units total) into the vein every Monday, Wednesday, and Friday with hemodialysis. 03/13/21   Ezekiel Slocumb, DO   Current Facility-Administered Medications  Medication Dose Route Frequency Provider Last Rate Last Admin   acetaminophen (TYLENOL) tablet 650 mg  650 mg Oral Q4H PRN Tilden Dome, MD   650 mg at 03/13/21 0105   amLODipine (NORVASC) tablet 10 mg  10 mg Oral Daily Etheleen Nicks, MD   10 mg at 03/12/21 0911   Ampicillin-Sulbactam (UNASYN) 3 g in sodium chloride 0.9 % 100 mL IVPB  3 g Intravenous Q12H Erenest Blank, RPH 200 mL/hr at 03/13/21 1000  Infusion Verify at 03/13/21 1000   carvedilol (COREG) tablet 25 mg  25 mg Oral BID WC Jacky Kindle, MD   25 mg at 03/13/21 0730   Chlorhexidine Gluconate Cloth 2 % PADS 6 each  6 each Topical Q0600 Donato Heinz, MD       dicyclomine (BENTYL) capsule 10 mg  10 mg Oral Q6H PRN Jacky Kindle, MD   10 mg at 03/12/21 1828   docusate sodium (COLACE) capsule 100 mg  100 mg Oral BID PRN Etheleen Nicks, MD       heparin injection 5,000 Units  5,000 Units Subcutaneous Q8H Etheleen Nicks, MD       hydrALAZINE (APRESOLINE) tablet  100 mg  100 mg Oral Q8H Jacky Kindle, MD   100 mg at 03/13/21 2353   labetalol (NORMODYNE) injection 10 mg  10 mg Intravenous Q2H PRN Etheleen Nicks, MD   10 mg at 03/11/21 6144   MEDLINE mouth rinse  15 mL Mouth Rinse BID Tilden Dome, MD   15 mL at 03/13/21 0946   minoxidil (LONITEN) tablet 5 mg  5 mg Oral Daily Etheleen Nicks, MD   5 mg at 03/12/21 0912   ondansetron [facility]) injection 4 mg  4 mg Intravenous Q8H PRN Jacky Kindle, MD   4 mg at 03/12/21 2104   oxyCODONE (Oxy IR/ROXICODONE) immediate release tablet 5 mg  5 mg Oral Q4H PRN Tilden Dome, MD   5 mg at 03/13/21 0819   polyethylene glycol (MIRALAX / GLYCOLAX) packet 17 g  17 g Oral Daily PRN Etheleen Nicks, MD       Labs: Basic Metabolic Panel: Recent Labs  Lab 03/08/21 0930 03/09/21 1319 03/10/21 1541 03/11/21 0647  NA 134* 135 133* 137  K 3.8 4.7 5.3* 5.1  CL 95* 93* 91* 96*  CO2 26 31 31 27   GLUCOSE 86 127* 97 109*  BUN 41* 49* 49* 26*  CREATININE 9.65* 12.44* 15.69* 10.63*  CALCIUM 8.0* 8.3* 8.2* 8.8*  PHOS 3.8  --  8.0*  --    Liver Function Tests: Recent Labs  Lab 03/08/21 0930 03/10/21 1541  ALBUMIN 3.0* 3.1*   No results for input(s): LIPASE, AMYLASE in the last 168 hours. No results for input(s): AMMONIA in the last 168 hours. CBC: Recent Labs  Lab 03/08/21 0930 03/09/21 1319 03/10/21 1541 03/11/21 0647 03/13/21 0945  WBC 7.2 10.5 7.9 8.4 8.7  NEUTROABS  --   --   --   --  6.3  HGB 6.9* 7.2* 6.5* 7.0* 7.3*  HCT 21.1* 22.0* 20.2* 22.2* 22.7*  MCV 90.2 91.7 89.8 91.7 92.3  PLT 215 230 204 217 261   Cardiac Enzymes: No results for input(s): CKTOTAL, CKMB, CKMBINDEX, TROPONINI in the last 168 hours. CBG: No results for input(s): GLUCAP in the last 168 hours. Iron Studies: No results for input(s): IRON, TIBC, TRANSFERRIN, FERRITIN in the last 72 hours. Studies/Results: DG CHEST PORT 1 VIEW  Result Date: 03/12/2021 CLINICAL DATA:  Pleural effusion.  Follow-up exam.  Left chest  tube. EXAM: PORTABLE CHEST 1 VIEW COMPARISON:  03/11/2021 and older studies. FINDINGS: Loculated right pneumothorax, evident at the apex, unchanged from the previous day's study. Stable left mid inferior hemithorax pigtail chest tube. Opacity extends along the periphery of the left lung consistent with residual pleural fluid. There are linear and patchy airspace opacities within the aerated left lung likely due to atelectasis. Improved aeration of the right lung with an interval decrease in hazy  airspace opacity. There is persistent bilateral interstitial thickening. Small right pleural effusion, incompletely imaged on the study. No right pneumothorax. Stable right internal jugular tunneled dual lumen central venous catheter. IMPRESSION: 1. Improved right lung aeration with decreased hazy airspace opacity. 2. No other change. 3. Persistent loculated left pneumothorax at the apex. No change in the residual left pleural effusion or in the interstitial and airspace left lung opacities. 4. Stable left chest tube. Electronically Signed   By: Lajean Manes M.D.   On: 03/12/2021 12:43    ROS: Pertinent items are noted in HPI. Physical Exam: Vitals:   03/13/21 0600 03/13/21 0700 03/13/21 0743 03/13/21 0800  BP: (!) 148/77 (!) 158/73  124/64  Pulse: 93 94  91  Resp: 20 (!) 22  16  Temp:   98.5 F (36.9 C)   TempSrc:   Oral   SpO2: 99% 96%  95%      Weight change:   Intake/Output Summary (Last 24 hours) at 03/13/2021 1012 Last data filed at 03/13/2021 1002 Gross per 24 hour  Intake 1121.79 ml  Output 574 ml  Net 547.79 ml   BP 124/64 (BP Location: Right Arm)    Pulse 91    Temp 98.5 F (36.9 C) (Oral)    Resp 16    SpO2 95%  General appearance: fatigued and no distress Head: Normocephalic, without obvious abnormality, atraumatic Resp: diminished breath sounds LLL Cardio: regular rate and rhythm, S1, S2 normal, no murmur, click, rub or gallop GI: soft, non-tender; bowel sounds normal; no masses,   no organomegaly Extremities: extremities normal, atraumatic, no cyanosis or edema and LUE AVF +T/B Dialysis Access:  Dialysis Orders: Center: [facility]  on MWF . EDW 82 kg HD Bath 2K/2.5Ca  Time 225 minutes Heparin 2700 units bolus then 560 units/hr. Access RIJ TDC (LUE AVF tortuous) BFR 400 DFR 500    Micera 100 mcg IV every 2 weeks, Venofer  50 mg IV   Assessment/Plan:  Acute hypoxic respiratory failure due to recurrent bilateral pleural effusions L>R - loculated left pleural effusion, not a surgical candidate and continue with chest tube drainage.  PCCM managing.  ESRD -  Will continue with HD on MWF schedule  Hypertension/volume  - UF as tolerated to get to edw.  No weights documented in the records.  Will check standing weight before HD today.  Anemia  - Hgb low will dose aranesp today with HD.  Transfuse if Hgb continues to drop.   Metabolic bone disease -  continue with home meds  Nutrition - renal diet Chronic diastolic CHF - grade 2 DD, EF 60-65%.  UF as above Hypertensive urgency - better with home bp meds.   Donetta Potts, MD [place] Pager [phone] 03/13/2021, 10:12 AM

## 2021-03-13 NOTE — Procedures (Signed)
I was present at this dialysis session. I have reviewed the session itself and made appropriate changes.   Vital signs in last 24 hours:  Temp:  [97.6 F (36.4 C)-98.5 F (36.9 C)] 97.6 F (36.4 C) (12/19 1110) Pulse Rate:  [81-102] 93 (12/19 1200) Resp:  [16-23] 23 (12/19 1200) BP: (113-178)/(57-98) 115/59 (12/19 1200) SpO2:  [92 %-100 %] 95 % (12/19 0800) Weight:  [87 kg] 87 kg (12/19 1115) Weight change:  Filed Weights   03/13/21 1115  Weight: 87 kg    Recent Labs  Lab 03/10/21 1541 03/11/21 0647 03/13/21 0945  NA 133*   < > 136  K 5.3*   < > 3.8  CL 91*   < > 85*  CO2 31   < > 34*  GLUCOSE 97   < > 161*  BUN 49*   < > 42*  CREATININE 15.69*   < > 15.82*  CALCIUM 8.2*   < > 7.8*  PHOS 8.0*  --   --    < > = values in this interval not displayed.    Recent Labs  Lab 03/10/21 1541 03/11/21 0647 03/13/21 0945  WBC 7.9 8.4 8.7  NEUTROABS  --   --  6.3  HGB 6.5* 7.0* 7.3*  HCT 20.2* 22.2* 22.7*  MCV 89.8 91.7 92.3  PLT 204 217 261    Scheduled Meds:  amLODipine  10 mg Oral Daily   carvedilol  25 mg Oral BID WC   Chlorhexidine Gluconate Cloth  6 each Topical Q0600   darbepoetin (ARANESP) injection - DIALYSIS  100 mcg Intravenous Q Mon-HD   heparin  5,000 Units Subcutaneous Q8H   hydrALAZINE  100 mg Oral Q8H   mouth rinse  15 mL Mouth Rinse BID   minoxidil  5 mg Oral Daily   pantoprazole (PROTONIX) IV  40 mg Intravenous Q12H   Continuous Infusions: PRN Meds:.acetaminophen, dicyclomine, docusate sodium, labetalol, metoCLOPramide (REGLAN) injection, ondansetron (ZOFRAN) IV, oxyCODONE, polyethylene glycol   Donetta Potts,  MD 03/13/2021, 1:16 PM

## 2021-03-13 NOTE — Consult Note (Addendum)
[facility] Consult   Reason for Consult:  re-evaluation of IVC Referring Physician:  Aline August, MD  Patient Identification: Travis Long MRN:  [id number] Principal Diagnosis: Adjustment disorder Diagnosis:  Principal Problem:   Adjustment disorder Active Problems:   Encephalopathy   Acute on chronic anemia   ESRD (end stage renal disease) ([place])   Heart failure with preserved ejection fraction ([place])   Total Time spent with patient: 30 minutes  Subjective:   Travis Long is a 35 y.o. male with no known psychiatry history, HTN, ESRD on HD MWF at [facility], anemia of ESRD, originally presented to [facility] due to 2 week history of increasing SOB in the context of non adherent to blood pressure medications and dialysis (missed one session). He had thoracentesis for pleural effusion with atelectasis.  He was transferred to [facility] for closer monitoring, evaluation for consideration of VATS.  He was consulted due to his status of IVC at [facility].   According to the chart review, patient was IVC'd by psychiatry team due to concern of movement in leaving the hospital and despite his medical condition/risk of death.   HPI:   Discussed with the nurse.  The patient has been argumentative, and is not cooperative with staff due to certain beliefs he has about the treatment.  However, he has not attempted to leave, nor any other behavioral issues such as pulling lines.  Although he can be verbally aggressive, there is no physical aggression observed.   He was interviewed at the bedside.  He states that he has been doing good.  He apologized about the attitude he had at the previous hospital, all.  He states that he was scared and was "acting out." He also felt that medication (he could not recollect the name of the medication) made him having thoughts that they were trying to kill him.  He denies having any of these thoughts since coming to [facility]   When he was asked about the treatment he is receiving, he states that he tries to keep a close eye on it.  He has "mixed feeling" about the treatment he is getting.  Although he was told that he has "lots of fluid, I don't think I have lots of fluid," pointing out the chest tube.  He states that although he agrees that the team or a professional, and he is not denying what they say, he wants to make sure that he gets proper care.  He talks about the concern of people dying in the hospital due to mistake.  However, he is willing to communicate with the team if he has any concern, and is amenable to continue this chest tube and dialysis.  Of note, he also states that he was told that he may have a high risk procedure (likely VATS), which he feels hesitant about due to its possible risk. He verbalized understanding to continue to discuss with his primary team.   He states that although he feels down about the current situation, referring that he was totally doing fine until last year, he tries not to beat himself up.  He states that he has 2 children, and his life is good.  He also reports a very supportive mother at home.  He does not think he is depressed, and declines any referral to outpatient psychiatry including therapist at this time. He denies SI, HI.   Psych ROS-he had fair sleep and appetite.  He denies anxiety.  He denies  any history of trauma.  He denies hallucinations, paranoia.  Substance use- he uses marijuana daily for pain. Provided psychoeducation about its impact on his mood.   Past Psychiatric History:  Outpatient: denies Psychiatry admission: denies Previous suicide attempt: denies Past trials of medication: denies History of violence:  denies  Risk to Self:  low Risk to Others:  low Prior Inpatient Therapy:   Prior Outpatient Therapy:    Past Medical History:  Past Medical History:  Diagnosis Date   ESRD on hemodialysis ([place])    Hypertension     Past Surgical History:   Procedure Laterality Date   A/V FISTULAGRAM Left 03/09/2021   Procedure: A/V Fistulagram;  Surgeon: Algernon Huxley, MD;  Location: [place] [facility] LAB;  Service: Cardiovascular;  Laterality: Left;   AV FISTULA PLACEMENT Left    IR CATHETER TUBE CHANGE  03/09/2021   Family History:  Family History  Problem Relation Age of Onset   Hypertension Mother    Diabetes Mellitus II Mother    Family Psychiatric  History:  Brother- PTSD  Social History:  Social History   Substance and Sexual Activity  Alcohol Use Not Currently     Social History   Substance and Sexual Activity  Drug Use Never    Social History   Socioeconomic History   Marital status: Single    Spouse name: Not on file   Number of children: Not on file   Years of education: Not on file   Highest education level: Not on file  Occupational History   Not on file  Tobacco Use   Smoking status: Former    Types: Cigarettes   Smokeless tobacco: Never  Substance and Sexual Activity   Alcohol use: Not Currently   Drug use: Never   Sexual activity: Not on file  Other Topics Concern   Not on file  Social History Narrative   Not on file   Social Determinants of Health   Financial Resource Strain: Not on file  Food Insecurity: Not on file  Transportation Needs: Not on file  Physical Activity: Not on file  Stress: Not on file  Social Connections: Not on file   Additional Social History:    Allergies:  No Known Allergies  Labs:  Results for orders placed or performed during the hospital encounter of 03/11/21 (from the past 48 hour(s))  CBC with Differential/Platelet     Status: Abnormal   Collection Time: 03/13/21  9:45 AM  Result Value Ref Range   WBC 8.7 4.0 - 10.5 K/uL   RBC 2.46 (L) 4.22 - 5.81 MIL/uL   Hemoglobin 7.3 (L) 13.0 - 17.0 g/dL   HCT 22.7 (L) 39.0 - 52.0 %   MCV 92.3 80.0 - 100.0 fL   MCH 29.7 26.0 - 34.0 pg   MCHC 32.2 30.0 - 36.0 g/dL   RDW 15.1 11.5 - 15.5 %   Platelets 261 150 -  400 K/uL   nRBC 0.0 0.0 - 0.2 %   Neutrophils Relative % 71 %   Neutro Abs 6.3 1.7 - 7.7 K/uL   Lymphocytes Relative 9 %   Lymphs Abs 0.8 0.7 - 4.0 K/uL   Monocytes Relative 9 %   Monocytes Absolute 0.8 0.1 - 1.0 K/uL   Eosinophils Relative 9 %   Eosinophils Absolute 0.8 (H) 0.0 - 0.5 K/uL   Basophils Relative 1 %   Basophils Absolute 0.0 0.0 - 0.1 K/uL   Immature Granulocytes 1 %   Abs Immature Granulocytes  0.04 0.00 - 0.07 K/uL    Comment: Performed at [facility], [place] [address], [place], [place] [address]  Basic metabolic panel     Status: Abnormal   Collection Time: 03/13/21  9:45 AM  Result Value Ref Range   Sodium 136 135 - 145 mmol/L   Potassium 3.8 3.5 - 5.1 mmol/L   Chloride 85 (L) 98 - 111 mmol/L   CO2 34 (H) 22 - 32 mmol/L   Glucose, Bld 161 (H) 70 - 99 mg/dL    Comment: Glucose reference range applies only to samples taken after fasting for at least 8 hours.   BUN 42 (H) 6 - 20 mg/dL   Creatinine, Ser 15.82 (H) 0.61 - 1.24 mg/dL   Calcium 7.8 (L) 8.9 - 10.3 mg/dL   GFR, Estimated 4 (L) >60 mL/min    Comment: (NOTE) Calculated using the CKD-EPI Creatinine Equation (2021)    Anion gap 17 (H) 5 - 15    Comment: Performed at [facility] [address] [place], [place] [address]    Current Facility-Administered Medications  Medication Dose Route Frequency Provider Last Rate Last Admin   acetaminophen (TYLENOL) tablet 650 mg  650 mg Oral Q4H PRN Tilden Dome, MD   650 mg at 03/13/21 0105   amLODipine (NORVASC) tablet 10 mg  10 mg Oral Daily Etheleen Nicks, MD   10 mg at 03/12/21 0911   carvedilol (COREG) tablet 25 mg  25 mg Oral BID WC Jacky Kindle, MD   25 mg at 03/13/21 1629   Chlorhexidine Gluconate Cloth 2 % PADS 6 each  6 each Topical Q0600 Donato Heinz, MD       Darbepoetin Alfa (ARANESP) injection 100 mcg  100 mcg Intravenous Q Mon-HD Donato Heinz, MD   100 mcg at 03/13/21 1347   dicyclomine (BENTYL) capsule 10 mg  10 mg Oral  Q6H PRN Jacky Kindle, MD   10 mg at 03/12/21 1828   docusate sodium (COLACE) capsule 100 mg  100 mg Oral BID PRN Etheleen Nicks, MD       heparin injection 5,000 Units  5,000 Units Subcutaneous Q8H Etheleen Nicks, MD       hydrALAZINE (APRESOLINE) tablet 100 mg  100 mg Oral Q8H Chand, Currie Paris, MD   100 mg at 03/13/21 1601   labetalol (NORMODYNE) injection 10 mg  10 mg Intravenous Q2H PRN Etheleen Nicks, MD   10 mg at 03/11/21 0932   MEDLINE mouth rinse  15 mL Mouth Rinse BID Tilden Dome, MD   15 mL at 03/13/21 0946   metoCLOPramide (REGLAN) injection 10 mg  10 mg Intravenous Q6H PRN Aline August, MD       minoxidil (LONITEN) tablet 5 mg  5 mg Oral Daily Etheleen Nicks, MD   5 mg at 03/12/21 0912   ondansetron (ZOFRAN) injection 4 mg  4 mg Intravenous Q6H PRN Aline August, MD       oxyCODONE (Oxy IR/ROXICODONE) immediate release tablet 5 mg  5 mg Oral Q4H PRN Tilden Dome, MD   5 mg at 03/13/21 1540   pantoprazole (PROTONIX) injection 40 mg  40 mg Intravenous Q12H Alekh, Kshitiz, MD       polyethylene glycol (MIRALAX / GLYCOLAX) packet 17 g  17 g Oral Daily PRN Etheleen Nicks, MD        Musculoskeletal: Strength & Muscle Tone: within normal limits Gait & Station:  N/A, lying in the bed Patient leans:  N/A            Psychiatric Specialty Exam:  Physical Exam: Physical Exam Vitals and nursing note reviewed.  Neurological:     Mental Status: He is alert.  Psychiatric:        Attention and Perception: Attention and perception normal. He does not perceive auditory or visual hallucinations.        Mood and Affect: Affect normal.        Speech: Speech normal.        Behavior: Behavior normal. Behavior is cooperative.        Thought Content: Thought content normal. Thought content is not paranoid. Thought content does not include homicidal or suicidal ideation.        Cognition and Memory: Cognition normal. Cognition is not impaired. Memory is not impaired. He does  not exhibit impaired recent memory or impaired remote memory.        Judgment: Judgment normal.     Comments: Appearance: lying in the bed, having fair eye contact.   Reports down mood.    Review of Systems  Psychiatric/Behavioral:  Negative for depression, hallucinations, memory loss, substance abuse and suicidal ideas. The patient is not nervous/anxious and does not have insomnia.   All other systems reviewed and are negative. Blood pressure (!) 155/77, pulse (!) 103, temperature 99 F (37.2 C), temperature source Oral, resp. rate (!) 23, weight 87 kg, SpO2 (!) 89 %. Body mass index is 28.32 kg/m.  Treatment Plan Summary  Sharief Wainwright is a 35 y.o. male with no known psychiatry history, HTN, ESRD on HD MWF at [facility], anemia of ESRD, originally presented to [facility] due to 2 week history of increasing SOB in the context of non adherent to blood pressure medications and dialysis (missed one session). He had thoracentesis for pleural effusion with atelectasis.  He was transferred to [facility] for closer monitoring, evaluation for consideration of VATS.  He was consulted due to his status of IVC at [facility].   # ADjustment disorder, unspecified Although he did report occasional disagreement with the treatment plans by the primary team, these appear to be more stemming from being overwhelmed due to his current medical situation. Noted that he did apologize about his previous behavior, and partly attributed this to side effect from some medication he received at [facility] as well (he was unable to tell the name of this medication.)  Given that he is now amenable to continue treatment in the hospital, will rescind IVC.  Would recommend the team to continue to validate his concern while discussing treatment plans. Of note, he is able to sustain attention, and was alert through the interview; it is less likely that he has delirium on today's evaluation.   Plan -  Rescind IVC. The paperwork was signed.   Disposition: No evidence of imminent risk to self or others at present.   Patient does not meet criteria for psychiatric inpatient admission.   We will sign off at this time. This has been communicated to the primary team. If issues arise in the future, don't hesitate to reconsult the Psychiatry Inpatient Consult Service.   Norman Clay, MD  Norman Clay, MD 03/13/2021 5:14 PM

## 2021-03-13 NOTE — Progress Notes (Signed)
CSW notified by MD and psychiatry that patient no longer needs IVC. CSW completed rescind paperwork and sent to clerk of court to clear IVC.   Patient no longer under IVC at this time.  Laveda Abbe, [place] Clinical Social Worker [phone]

## 2021-03-13 NOTE — Progress Notes (Signed)
NAME:  Travis Long, MRN:  [id number], DOB:  1985-12-19, LOS: 2 ADMISSION DATE:  03/11/2021, CONSULTATION DATE:  12/17 REFERRING MD:  Dr. Arbutus Ped, CHIEF COMPLAINT:  Loculated pleural effusion   History of Present Illness:  Patient is a 35 yo M w/ pertinent PMH of HTN, ESRD on HD MWF present to [facility] on 12/12 with increasing SOB.  Patient has not been adherent with BP meds and missed 1 session of dialysis last week.  Patient has had history of pleural effusions in 01/2020 and 11/2020 thoracentesis showed exudative process.  On 12/12 patient develops progressive SOB and was admitted to [facility].  CXR showed bilateral pleural effusions L >R and questionable pneumonia.  Patient mildly hypoxic on 2 L [place].  Patient very hypertensive.  Thoracentesis performed 12/12 and pleural studies indicate transudative process.  On 12/14 left sided pigtail chest tube placed with 850 cc output in patient was given tPA through tube.  On 12/15, chest tube halfway out and had IR replaced chest tube.  Patient's pleural effusion still remains loculated and not responding to tPA.  Patient admits being transferred to [facility] from [facility] for CT surgery evaluation and possible VATS.  On 12/16 patient is having some delirium/agitation and pulling at lines.  Was started on Precedex and given Ativan.  PCCM consulted for ICU admission  Pertinent  Medical History   Past Medical History:  Diagnosis Date   ESRD on hemodialysis ([place])    Hypertension      Significant Hospital Events: Including procedures, antibiotic start and stop dates in addition to other pertinent events   12/12: Admitted to [facility] for pleural effusion 12/17: transferred to [facility] from [facility] for loculated Left pleural effusion and VATS evaluation from CT surgery 12/18: Patient was seen by CT surgery, recommend no surgical intervention, continue chest tube drainage  Interim History / Subjective:  No overnight events. He remained afebrile Objective   Blood pressure (!)  158/73, pulse 94, temperature 98.5 F (36.9 C), temperature source Oral, resp. rate (!) 22, SpO2 96 %.        Intake/Output Summary (Last 24 hours) at 03/13/2021 0753 Last data filed at 03/13/2021 0700 Gross per 24 hour  Intake 894 ml  Output 720 ml  Net 174 ml   There were no vitals filed for this visit.  Examination: Physical exam: General: Acutely ill-appearing young African-American male, lying on the bed HEENT: [place]/AT, eyes anicteric.  moist mucus membranes Neuro: Alert, awake, following commands Chest: Reduced air entry on left side, clear to auscultation on right side, no wheezes or rhonchi Heart: Tachycardic, regular rhythm, no murmurs or gallops Abdomen: Soft, nontender, nondistended, bowel sounds present Skin: No rash   Resolved Hospital Problem list     Assessment & Plan:  Acute respiratory failure with hypoxia Recurrent bilateral pleural effusions, left more than right Loculated left pleural effusion Continue titrate nasal cannula oxygen with O2 sat goal >90%, currently on 2 L oxygen via nasal cannula Closely monitor chest tube output Chest tube output was 805 cc in last 24 hours TCTS consulted, recommend no surgical intervention, continue chest tube drainage Continue chest tube - -20 suction bleeding After fluid is drained patient may need pleurodesis to prevent a recurrence Will repeat x-ray chest tomorrow morning  Chronic HFpEF: Echo 12/15: Grade 2 DD; EF 60 to 65% Patient looks euvolemic Strict I/O's and daily weights  CAP Patient remained afebrile Patient completed 7 days therapy with Unasyn  Acute metabolic encephalopathy Mental status has improved  Hypertensive urgency  Patient blood pressure is better controlled now  Continue Coreg hydralazine and amlodipine Continue minoxidil for resistant hypertension  ESRD on HD MWF HD per nephro Replace electrolytes as indicated Avoid nephrotoxic agents, ensure adequate renal perfusion  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  Labs   CBC: Recent Labs  Lab 03/07/21 0624 03/08/21 0930 03/09/21 1319 03/10/21 1541 03/11/21 0647  WBC 8.6 7.2 10.5 7.9 8.4  HGB 7.2* 6.9* 7.2* 6.5* 7.0*  HCT 21.3* 21.1* 22.0* 20.2* 22.2*  MCV 89.1 90.2 91.7 89.8 91.7  PLT 222 215 230 204 657    Basic Metabolic Panel: Recent Labs  Lab 03/07/21 0624 03/08/21 0930 03/09/21 1319 03/10/21 1541 03/11/21 0647  NA 135 134* 135 133* 137  K 5.1 3.8 4.7 5.3* 5.1  CL 98 95* 93* 91* 96*  CO2 28 26 31 31 27   GLUCOSE 94 86 127* 97 109*  BUN 47* 41* 49* 49* 26*  CREATININE 12.44* 9.65* 12.44* 15.69* 10.63*  CALCIUM 8.4* 8.0* 8.3* 8.2* 8.8*  PHOS  --  3.8  --  8.0*  --    GFR: Estimated Creatinine Clearance: 10.7 mL/min (A) (by C-G formula based on SCr of 10.63 mg/dL (H)). Recent Labs  Lab 03/08/21 0930 03/09/21 1319 03/10/21 1541 03/11/21 0647  WBC 7.2 10.5 7.9 8.4    Liver Function Tests: Recent Labs  Lab 03/08/21 0930 03/10/21 1541  ALBUMIN 3.0* 3.1*   No results for input(s): LIPASE, AMYLASE in the last 168 hours. No results for input(s): AMMONIA in the last 168 hours.  ABG    Component Value Date/Time   HCO3 31.2 (H) 11/30/2020 1002   ACIDBASEDEF 4.7 (H) 09/16/2020 0609   O2SAT 79.2 11/30/2020 1002     Coagulation Profile: Recent Labs  Lab 03/07/21 0624  INR 1.2    Cardiac Enzymes: No results for input(s):  CKTOTAL, CKMB, CKMBINDEX, TROPONINI in the last 168 hours.  HbA1C: No results found for: HGBA1C  CBG: No results for input(s): GLUCAP in the last 168 hours.     Jacky Kindle MD [facility] See Amion for pager If no response to pager, please call [phone] until 7pm After 7pm, Please call E-link [phone]

## 2021-03-13 NOTE — Progress Notes (Signed)
Patient ID: Travis Long, male   DOB: [birth year], 35 y.o.   MRN: [id number]  PROGRESS NOTE    Xayne Brumbaugh  ZJQ:[id number] DOB: 02-Jan-1986 DOA: 03/11/2021 PCP: Pcp, No   Brief Narrative:  35 year old male with history of hypertension, end-stage renal disease on hemodialysis, chronic diastolic CHF, anemia of chronic disease presented to [facility] on 03/06/2021 for worsening shortness of breath after missing hemodialysis sessions.  During work-up chest x-ray showed left sided large pleural effusion; thoracentesis was performed.  Nephrology and pulmonary were consulted.  He underwent inpatient hemodialysis.  Chest tube was placed and he subsequently received tPA: Pulmonary recommended transfer to [facility] for CT surgery evaluation for loculated pleural effusion.  He apparently was IVC'd by  psychiatry on 03/10/2021.  He was transferred to [facility] and was admitted by [facility] service.  Cardiothoracic surgery evaluated the patient and recommended conservative management.  He was transferred to [facility] service from 03/13/2021 onwards.  Assessment & Plan:   Loculated left pleural effusion Acute respiratory failure with hypoxia -Status post thoracentesis on 03/06/2021 with removal of 750 cc fluid -Status post chest tube placement on 03/08/2021 and received tPA on 03/08/2021 and 03/09/2021: Imaging did not improve.  Pulmonary recommended transfer to [facility]. -He was transferred to [facility] and was admitted by [facility] service.  Cardiothoracic surgery evaluated the patient and recommended conservative management.  He was transferred to [facility] service from 03/13/2021 onwards. -Pulmonary following and managing chest tube. -Currently on 1 L oxygen via nasal cannula  Currently acquired pneumonia -Currently afebrile.  Has already received 7 days of Unasyn therapy.  DC antibiotics.  Acute on chronic diastolic CHF -Echo on 24/11/7351: EF of 60 to 65% with grade 2 diastolic  dysfunction. -Fistulogram on 03/09/2021: Normal flow, no evidence of high-output heart failure -Volume managed by dialysis.  Strict input and output.  Daily weights.  Fluid restriction.  Stress reaction causing mixed disturbance of emotion and conduct -Patient was IVC'd by  psychiatry on 03/10/2021.  Continue Engineer, materials.  Will consult psychiatry.  Hypertensive urgency -Due to noncompliance with medications.  Blood pressure improving.  Continue amlodipine, Coreg, hydralazine and minoxidil.  End-stage renal disease on hemodialysis -Patient received hemodialysis inpatient at [facility].  Last hemodialysis was on 03/10/2021.  I consulted Dr. Coladonato/nephrology for the same.  Anemia of chronic disease -Hemoglobin 7.3 today.  Transfuse if hemoglobin is less than 7.  Medical noncompliance -Patient intermittently declines recommended care during hospital admission including blood draws.  DVT prophylaxis: Heparin subcutaneous Code Status: Full Family Communication: None at bedside Disposition Plan: Status is: Inpatient  Remains inpatient appropriate because: Of severity of illness  Consultants: PCCM/cardiothoracic surgery/nephrology.  Consult psychiatry   Procedures: Chest tube placed on 03/08/2021 at [facility]  Antimicrobials:  Anti-infectives (From admission, onward)    Start     Dose/Rate Route Frequency Ordered Stop   03/11/21 1000  Ampicillin-Sulbactam (UNASYN) 3 g in sodium chloride 0.9 % 100 mL IVPB        3 g 200 mL/hr over 30 Minutes Intravenous Every 12 hours 03/11/21 0543          Subjective: Patient seen and examined at bedside.  Poor historian.  Denies worsening shortness of breath, chest pain, nausea or vomiting.  Objective: Vitals:   03/13/21 0600 03/13/21 0700 03/13/21 0743 03/13/21 0800  BP: (!) 148/77 (!) 158/73  124/64  Pulse: 93 94  91  Resp: 20 (!) 22  16  Temp:   98.5 F (36.9 C)  TempSrc:   Oral   SpO2: 99% 96%  95%    Intake/Output  Summary (Last 24 hours) at 03/13/2021 0929 Last data filed at 03/13/2021 0852 Gross per 24 hour  Intake 1044 ml  Output 623 ml  Net 421 ml   There were no vitals filed for this visit.  Examination:  General exam: Appears calm and comfortable.  Currently on 1 L oxygen via nasal cannula. Respiratory system: Bilateral decreased breath sounds at bases with intermittent tachypnea; left-sided chest tube present Cardiovascular system: S1 & S2 heard, Rate controlled Gastrointestinal system: Abdomen is nondistended, soft and nontender. Normal bowel sounds heard. Extremities: No cyanosis, clubbing; trace lower extremity edema Central nervous system: Alert and oriented.  Poor historian.  Slow to respond.  No focal neurological deficits. Moving extremities Skin: No rashes, lesions or ulcers Psychiatry: Affect is mostly flat.  Does not participate in conversation much.   Data Reviewed: I have personally reviewed following labs and imaging studies  CBC: Recent Labs  Lab 03/07/21 0624 03/08/21 0930 03/09/21 1319 03/10/21 1541 03/11/21 0647  WBC 8.6 7.2 10.5 7.9 8.4  HGB 7.2* 6.9* 7.2* 6.5* 7.0*  HCT 21.3* 21.1* 22.0* 20.2* 22.2*  MCV 89.1 90.2 91.7 89.8 91.7  PLT 222 215 230 204 485   Basic Metabolic Panel: Recent Labs  Lab 03/07/21 0624 03/08/21 0930 03/09/21 1319 03/10/21 1541 03/11/21 0647  NA 135 134* 135 133* 137  K 5.1 3.8 4.7 5.3* 5.1  CL 98 95* 93* 91* 96*  CO2 28 26 31 31 27   GLUCOSE 94 86 127* 97 109*  BUN 47* 41* 49* 49* 26*  CREATININE 12.44* 9.65* 12.44* 15.69* 10.63*  CALCIUM 8.4* 8.0* 8.3* 8.2* 8.8*  PHOS  --  3.8  --  8.0*  --    GFR: Estimated Creatinine Clearance: 10.7 mL/min (A) (by C-G formula based on SCr of 10.63 mg/dL (H)). Liver Function Tests: Recent Labs  Lab 03/08/21 0930 03/10/21 1541  ALBUMIN 3.0* 3.1*   No results for input(s): LIPASE, AMYLASE in the last 168 hours. No results for input(s): AMMONIA in the last 168 hours. Coagulation  Profile: Recent Labs  Lab 03/07/21 0624  INR 1.2   Cardiac Enzymes: No results for input(s): CKTOTAL, CKMB, CKMBINDEX, TROPONINI in the last 168 hours. BNP (last 3 results) No results for input(s): PROBNP in the last 8760 hours. HbA1C: No results for input(s): HGBA1C in the last 72 hours. CBG: No results for input(s): GLUCAP in the last 168 hours. Lipid Profile: No results for input(s): CHOL, HDL, LDLCALC, TRIG, CHOLHDL, LDLDIRECT in the last 72 hours. Thyroid Function Tests: No results for input(s): TSH, T4TOTAL, FREET4, T3FREE, THYROIDAB in the last 72 hours. Anemia Panel: No results for input(s): VITAMINB12, FOLATE, FERRITIN, TIBC, IRON, RETICCTPCT in the last 72 hours. Sepsis Labs: No results for input(s): PROCALCITON, LATICACIDVEN in the last 168 hours.  Recent Results (from the past 240 hour(s))  Resp Panel by RT-PCR (Flu A&B, Covid) Nasopharyngeal Swab     Status: None   Collection Time: 03/06/21  6:45 AM   Specimen: Nasopharyngeal Swab; Nasopharyngeal(NP) swabs in vial transport medium  Result Value Ref Range Status   SARS Coronavirus 2 by RT PCR NEGATIVE NEGATIVE Final    Comment: (NOTE) SARS-CoV-2 target nucleic acids are NOT DETECTED.  The SARS-CoV-2 RNA is generally detectable in upper respiratory specimens during the acute phase of infection. The lowest concentration of SARS-CoV-2 viral copies this assay can detect is 138 copies/mL. A negative result does not  preclude SARS-Cov-2 infection and should not be used as the sole basis for treatment or other patient management decisions. A negative result may occur with  improper specimen collection/handling, submission of specimen other than nasopharyngeal swab, presence of viral mutation(s) within the areas targeted by this assay, and inadequate number of viral copies(<138 copies/mL). A negative result must be combined with clinical observations, patient history, and epidemiological information. The expected result  is Negative.  Fact Sheet for Patients:  EntrepreneurPulse.com.au  Fact Sheet for Healthcare Providers:  IncredibleEmployment.be  This test is no t yet approved or cleared by the [place] FDA and  has been authorized for detection and/or diagnosis of SARS-CoV-2 by FDA under an Emergency Use Authorization (EUA). This EUA will remain  in effect (meaning this test can be used) for the duration of the COVID-19 declaration under Section 564(b)(1) of the Act, 21 U.S.C.section 360bbb-3(b)(1), unless the authorization is terminated  or revoked sooner.       Influenza A by PCR NEGATIVE NEGATIVE Final   Influenza B by PCR NEGATIVE NEGATIVE Final    Comment: (NOTE) The Xpert Xpress SARS-CoV-2/FLU/RSV plus assay is intended as an aid in the diagnosis of influenza from Nasopharyngeal swab specimens and should not be used as a sole basis for treatment. Nasal washings and aspirates are unacceptable for Xpert Xpress SARS-CoV-2/FLU/RSV testing.  Fact Sheet for Patients: EntrepreneurPulse.com.au  Fact Sheet for Healthcare Providers: IncredibleEmployment.be  This test is not yet approved or cleared by the [place] FDA and has been authorized for detection and/or diagnosis of SARS-CoV-2 by FDA under an Emergency Use Authorization (EUA). This EUA will remain in effect (meaning this test can be used) for the duration of the COVID-19 declaration under Section 564(b)(1) of the Act, 21 U.S.C. section 360bbb-3(b)(1), unless the authorization is terminated or revoked.  Performed at [facility] [place] [facility], [address] [place], [place] [address]   Body fluid culture w Gram Stain     Status: None   Collection Time: 03/06/21  8:40 AM   Specimen: PATH Cytology Pleural fluid  Result Value Ref Range Status   Specimen Description   Final    PLEURAL Performed at [facility], [address] [place],  [place] [address]    Special Requests   Final    NONE Performed at [facility], [address] [place], [place] [address]    Gram Stain   Final    RARE WBC PRESENT, PREDOMINANTLY MONONUCLEAR NO ORGANISMS SEEN    Culture   Final    NO GROWTH 3 DAYS Performed at [facility], [place] [address], [place], [place] [address]    Report Status 03/09/2021 FINAL  Final  MRSA Next Gen by PCR, Nasal     Status: None   Collection Time: 03/07/21  5:31 AM   Specimen: Nasal Mucosa; Nasal Swab  Result Value Ref Range Status   MRSA by PCR Next Gen NOT DETECTED NOT DETECTED Final    Comment: (NOTE) The GeneXpert MRSA Assay (FDA approved for NASAL specimens only), is one component of a comprehensive MRSA colonization surveillance program. It is not intended to diagnose MRSA infection nor to guide or monitor treatment for MRSA infections. Test performance is not FDA approved in patients less than 56 years old. Performed at [facility], [address] [place], [place] [address]   MRSA Next Gen by PCR, Nasal     Status: None   Collection Time: 03/11/21  5:09 AM   Specimen:  Nasal Mucosa; Nasal Swab  Result Value Ref Range Status   MRSA by PCR Next Gen NOT DETECTED NOT DETECTED Final    Comment: (NOTE) The GeneXpert MRSA Assay (FDA approved for NASAL specimens only), is one component of a comprehensive MRSA colonization surveillance program. It is not intended to diagnose MRSA infection nor to guide or monitor treatment for MRSA infections. Test performance is not FDA approved in patients less than 35 years old. Performed at [facility], [place] [address], [place], [place] [address]          Radiology Studies: DG CHEST PORT 1 VIEW  Result Date: 03/12/2021 CLINICAL DATA:  Pleural effusion.  Follow-up exam.  Left chest tube. EXAM: PORTABLE CHEST 1 VIEW COMPARISON:  03/11/2021 and older studies. FINDINGS: Loculated right pneumothorax, evident at the apex, unchanged from  the previous day's study. Stable left mid inferior hemithorax pigtail chest tube. Opacity extends along the periphery of the left lung consistent with residual pleural fluid. There are linear and patchy airspace opacities within the aerated left lung likely due to atelectasis. Improved aeration of the right lung with an interval decrease in hazy airspace opacity. There is persistent bilateral interstitial thickening. Small right pleural effusion, incompletely imaged on the study. No right pneumothorax. Stable right internal jugular tunneled dual lumen central venous catheter. IMPRESSION: 1. Improved right lung aeration with decreased hazy airspace opacity. 2. No other change. 3. Persistent loculated left pneumothorax at the apex. No change in the residual left pleural effusion or in the interstitial and airspace left lung opacities. 4. Stable left chest tube. Electronically Signed   By: Lajean Manes M.D.   On: 03/12/2021 12:43        Scheduled Meds:  amLODipine  10 mg Oral Daily   carvedilol  25 mg Oral BID WC   Chlorhexidine Gluconate Cloth  6 each Topical Q0600   Chlorhexidine Gluconate Cloth  6 each Topical Q0600   heparin  5,000 Units Subcutaneous Q8H   hydrALAZINE  100 mg Oral Q8H   mouth rinse  15 mL Mouth Rinse BID   minoxidil  5 mg Oral Daily   Continuous Infusions:  ampicillin-sulbactam (UNASYN) IV Stopped (03/12/21 2139)          Aline August, MD [facility] 03/13/2021, 9:29 AM

## 2021-03-14 ENCOUNTER — Inpatient Hospital Stay (HOSPITAL_COMMUNITY): Payer: Medicare Other

## 2021-03-14 LAB — CBC WITH DIFFERENTIAL/PLATELET
Abs Immature Granulocytes: 0.07 10*3/uL (ref 0.00–0.07)
Basophils Absolute: 0.1 10*3/uL (ref 0.0–0.1)
Basophils Relative: 1 %
Eosinophils Absolute: 0.8 10*3/uL — ABNORMAL HIGH (ref 0.0–0.5)
Eosinophils Relative: 8 %
HCT: 26.5 % — ABNORMAL LOW (ref 39.0–52.0)
Hemoglobin: 8.5 g/dL — ABNORMAL LOW (ref 13.0–17.0)
Immature Granulocytes: 1 %
Lymphocytes Relative: 13 %
Lymphs Abs: 1.2 10*3/uL (ref 0.7–4.0)
MCH: 30.2 pg (ref 26.0–34.0)
MCHC: 32.1 g/dL (ref 30.0–36.0)
MCV: 94.3 fL (ref 80.0–100.0)
Monocytes Absolute: 1.1 10*3/uL — ABNORMAL HIGH (ref 0.1–1.0)
Monocytes Relative: 12 %
Neutro Abs: 6 10*3/uL (ref 1.7–7.7)
Neutrophils Relative %: 65 %
Platelets: 318 10*3/uL (ref 150–400)
RBC: 2.81 MIL/uL — ABNORMAL LOW (ref 4.22–5.81)
RDW: 15.4 % (ref 11.5–15.5)
WBC: 9.2 10*3/uL (ref 4.0–10.5)
nRBC: 0 % (ref 0.0–0.2)

## 2021-03-14 LAB — BASIC METABOLIC PANEL
Anion gap: 14 (ref 5–15)
BUN: 20 mg/dL (ref 6–20)
CO2: 30 mmol/L (ref 22–32)
Calcium: 9 mg/dL (ref 8.9–10.3)
Chloride: 92 mmol/L — ABNORMAL LOW (ref 98–111)
Creatinine, Ser: 10.5 mg/dL — ABNORMAL HIGH (ref 0.61–1.24)
GFR, Estimated: 6 mL/min — ABNORMAL LOW (ref >60)
Glucose, Bld: 93 mg/dL (ref 70–99)
Potassium: 3.6 mmol/L (ref 3.5–5.1)
Sodium: 136 mmol/L (ref 135–145)

## 2021-03-14 NOTE — Progress Notes (Signed)
Patient ID: Travis Long, male   DOB: 19-Aug-1985, 35 y.o.   MRN: [id number] S: No events overnight O:BP (!) 133/58    Pulse 100    Temp 99.2 F (37.3 C) (Oral)    Resp (!) 21    Wt 85.4 kg    SpO2 98%    BMI 27.80 kg/m   Intake/Output Summary (Last 24 hours) at 03/14/2021 1019 Last data filed at 03/14/2021 0900 Gross per 24 hour  Intake 690 ml  Output 2150 ml  Net -1460 ml   Intake/Output: I/O last 3 completed shifts: In: 1661.8 [P.O.:1284; IV Piggyback:377.8] Out: 2629 [Urine:1; Emesis/NG output:277; Other:2000; Stool:1; Chest Tube:350]  Intake/Output this shift:  Total I/O In: 150 [P.O.:150] Out: -  Weight change:  Gen: NAD CVS: RRR Resp: decreased BS at bases bilaterally Abd:+BS, soft, NT/Nd Ext: no edema, LAVF +T/B, tortuous  Recent Labs  Lab 03/08/21 0930 03/09/21 1319 03/10/21 1541 03/11/21 0647 03/13/21 0945 03/13/21 1838 03/14/21 0525  NA 134* 135 133* 137 136 134* 136  K 3.8 4.7 5.3* 5.1 3.8 3.5 3.6  CL 95* 93* 91* 96* 85* 91* 92*  CO2 26 31 31 27  34* 31 30  GLUCOSE 86 127* 97 109* 161* 154* 93  BUN 41* 49* 49* 26* 42* 16 20  CREATININE 9.65* 12.44* 15.69* 10.63* 15.82* 8.74* 10.50*  ALBUMIN 3.0*  --  3.1*  --   --  3.4*  --   CALCIUM 8.0* 8.3* 8.2* 8.8* 7.8* 8.9 9.0  PHOS 3.8  --  8.0*  --   --  4.0  --    Liver Function Tests: Recent Labs  Lab 03/08/21 0930 03/10/21 1541 03/13/21 1838  ALBUMIN 3.0* 3.1* 3.4*   No results for input(s): LIPASE, AMYLASE in the last 168 hours. No results for input(s): AMMONIA in the last 168 hours. CBC: Recent Labs  Lab 03/10/21 1541 03/11/21 0647 03/13/21 0945 03/13/21 1838 03/14/21 0525  WBC 7.9 8.4 8.7 8.4 9.2  NEUTROABS  --   --  6.3  --  6.0  HGB 6.5* 7.0* 7.3* 9.1* 8.5*  HCT 20.2* 22.2* 22.7* 29.0* 26.5*  MCV 89.8 91.7 92.3 93.5 94.3  PLT 204 217 261 360 318   Cardiac Enzymes: No results for input(s): CKTOTAL, CKMB, CKMBINDEX, TROPONINI in the last 168 hours. CBG: No results for input(s): GLUCAP  in the last 168 hours.  Iron Studies: No results for input(s): IRON, TIBC, TRANSFERRIN, FERRITIN in the last 72 hours. Studies/Results: DG CHEST PORT 1 VIEW  Result Date: 03/12/2021 CLINICAL DATA:  Pleural effusion.  Follow-up exam.  Left chest tube. EXAM: PORTABLE CHEST 1 VIEW COMPARISON:  03/11/2021 and older studies. FINDINGS: Loculated right pneumothorax, evident at the apex, unchanged from the previous day's study. Stable left mid inferior hemithorax pigtail chest tube. Opacity extends along the periphery of the left lung consistent with residual pleural fluid. There are linear and patchy airspace opacities within the aerated left lung likely due to atelectasis. Improved aeration of the right lung with an interval decrease in hazy airspace opacity. There is persistent bilateral interstitial thickening. Small right pleural effusion, incompletely imaged on the study. No right pneumothorax. Stable right internal jugular tunneled dual lumen central venous catheter. IMPRESSION: 1. Improved right lung aeration with decreased hazy airspace opacity. 2. No other change. 3. Persistent loculated left pneumothorax at the apex. No change in the residual left pleural effusion or in the interstitial and airspace left lung opacities. 4. Stable left chest tube. Electronically Signed  By: Lajean Manes M.D.   On: 03/12/2021 12:43    amLODipine  10 mg Oral Daily   carvedilol  25 mg Oral BID WC   Chlorhexidine Gluconate Cloth  6 each Topical Q0600   darbepoetin (ARANESP) injection - DIALYSIS  100 mcg Intravenous Q Mon-HD   heparin  5,000 Units Subcutaneous Q8H   hydrALAZINE  100 mg Oral Q8H   mouth rinse  15 mL Mouth Rinse BID   minoxidil  5 mg Oral Daily   pantoprazole (PROTONIX) IV  40 mg Intravenous Q12H    BMET    Component Value Date/Time   NA 136 03/14/2021 0525   K 3.6 03/14/2021 0525   CL 92 (L) 03/14/2021 0525   CO2 30 03/14/2021 0525   GLUCOSE 93 03/14/2021 0525   BUN 20 03/14/2021 0525    CREATININE 10.50 (H) 03/14/2021 0525   CALCIUM 9.0 03/14/2021 0525   GFRNONAA 6 (L) 03/14/2021 0525   CBC    Component Value Date/Time   WBC 9.2 03/14/2021 0525   RBC 2.81 (L) 03/14/2021 0525   HGB 8.5 (L) 03/14/2021 0525   HCT 26.5 (L) 03/14/2021 0525   PLT 318 03/14/2021 0525   MCV 94.3 03/14/2021 0525   MCH 30.2 03/14/2021 0525   MCHC 32.1 03/14/2021 0525   RDW 15.4 03/14/2021 0525   LYMPHSABS 1.2 03/14/2021 0525   MONOABS 1.1 (H) 03/14/2021 0525   EOSABS 0.8 (H) 03/14/2021 0525   BASOSABS 0.1 03/14/2021 0525    Dialysis Orders: Center: [facility]  on MWF . EDW 82 kg HD Bath 2K/2.5Ca  Time 225 minutes Heparin 2700 units bolus then 560 units/hr. Access RIJ TDC (LUE AVF tortuous) BFR 400 DFR 500    Micera 100 mcg IV every 2 weeks, Venofer  50 mg IV    Assessment/Plan:  Acute hypoxic respiratory failure due to recurrent bilateral pleural effusions L>R - loculated left pleural effusion, not a surgical candidate and continue with chest tube drainage.  PCCM managing.  Unfortunately does not appear to have a lot of options for longterm management.  ESRD -  Will continue with HD on MWF schedule  Hypertension/volume  - UF as tolerated to get to edw.  No weights documented in the records.  Will check standing weight before HD today.  Anemia  - Hgb low will dose aranesp today with HD.  Transfuse if Hgb continues to drop.   Metabolic bone disease -  continue with home meds  Nutrition - renal diet Chronic diastolic CHF - grade 2 DD, EF 60-65%.  UF as above Hypertensive urgency - better with home bp meds.  Donetta Potts, MD [employer] [phone]

## 2021-03-14 NOTE — Progress Notes (Signed)
NAME:  Travis Long, MRN:  [id number], DOB:  1986/03/06, LOS: 3 ADMISSION DATE:  03/11/2021, CONSULTATION DATE:  12/17 REFERRING MD:  Dr. Arbutus Ped, CHIEF COMPLAINT:  Loculated pleural effusion   History of Present Illness:  Patient is a 35 yo M w/ pertinent PMH of HTN, ESRD on HD MWF present to [facility] on 12/12 with increasing SOB.  Patient has not been adherent with BP meds and missed 1 session of dialysis last week.  Patient has had history of pleural effusions in 01/2020 and 11/2020 thoracentesis showed exudative process.  On 12/12 patient develops progressive SOB and was admitted to [facility].  CXR showed bilateral pleural effusions L >R and questionable pneumonia.  Patient mildly hypoxic on 2 L [place].  Patient very hypertensive.  Thoracentesis performed 12/12 and pleural studies indicate transudative process.  On 12/14 left sided pigtail chest tube placed with 850 cc output in patient was given tPA through tube.  On 12/15, chest tube halfway out and had IR replaced chest tube.  Patient's pleural effusion still remains loculated and not responding to tPA.  Patient admits being transferred to [facility] from [facility] for CT surgery evaluation and possible VATS.  On 12/16 patient is having some delirium/agitation and pulling at lines.  Was started on Precedex and given Ativan.  PCCM consulted for ICU admission  Pertinent  Medical History   Past Medical History:  Diagnosis Date   ESRD on hemodialysis ([place])    Hypertension      Significant Hospital Events: Including procedures, antibiotic start and stop dates in addition to other pertinent events   12/12: Admitted to [facility] for pleural effusion 12/17: transferred to [facility] from [facility] for loculated Left pleural effusion and VATS evaluation from CT surgery 12/18: Patient was seen by CT surgery, recommend no surgical intervention, continue chest tube drainage  Interim History / Subjective:  Output lower - 50-100 overnight. Tube is tidaling. Little change in size of  PTX ex vacuo  Objective   Blood pressure (!) 133/58, pulse 100, temperature 99.2 F (37.3 C), temperature source Oral, resp. rate (!) 21, weight 85.4 kg, SpO2 98 %.        Intake/Output Summary (Last 24 hours) at 03/14/2021 1057 Last data filed at 03/14/2021 0900 Gross per 24 hour  Intake 690 ml  Output 2150 ml  Net -1460 ml   Filed Weights   03/13/21 1115 03/13/21 1500 03/14/21 0500  Weight: 87 kg 85.2 kg 85.4 kg    Examination: General appearance: 35 y.o., male, NAD, conversant  Eyes: tracking appropriately HENT: NCAT; MMM Neck: Trachea midline; no lymphadenopathy, no JVD Lungs: diminished on left, no crackles, no wheeze, with normal respiratory effort CV: RRR, no murmur  Abdomen: Soft, non-tender; non-distended, BS present  Extremities: No peripheral edema, warm Skin: Normal turgor and texture; no rash Neuro: Alert and oriented to person and place, no focal deficit   Chest tube is tidaling on suction -20 with serous drainage   CXR with   OSH records reviewed:  PERTINENT ESRD WORKUP: -Renal arterial Doppler negative for stenosis bilaterally -Kidney ultrasound showed echogenic kidneys consistent with renal disease -Complements within normal limits -Anti-GBM, HIV, hepatitis C, hepatitis B, RPR, c-ANCA and MPO negative -No M spike -Urine protein/creatinine: 3.3   Resolved Hospital Problem list     Assessment & Plan:  Acute respiratory failure with hypoxia Recurrent exudative bilateral pleural effusions, L>R Trapped lung/non-expandable lung Etiology of exudative effusions is unclear but would favor multifactorial largely from uremia driving exudative component, volume overload  in setting HFpEF, ESRD worsening fluid burden. Prior workup of ESRD included broad autoimmune testing as above  - check ANA - Nephrology challenging dry weight - Continue titrate nasal cannula oxygen with O2 sat goal >90% - Closely monitor chest tube output - Place chest tube to water  seal today - TCTS consulted will reach out again today given unclear etiology of recurrent exudative effusions, wonder if he could benefit from pleural biopsy - Given size of residual PTX ex vacuo, feel medical pleurodesis unlikely to be successful  - Given symptomatic improvement from thora but with non-expandable lung, could consider pleurX placement if need for thoracentesis accelerates but this comes with risk of infection and malnourishment  Chronic HFpEF: Echo 12/15: Grade 2 DD; EF 60 to 65% Strict I/O's and daily weights  CAP Patient remained afebrile S/p Unasyn to complete 7-day therapy - I'm unsure if this truly represented pneumonia and parapneumonic effusion  Acute metabolic encephalopathy Mental status has improved He is off Precedex for 24 hours  Hypertensive urgency Patient blood pressure is better controlled now  Continue Coreg hydralazine and amlodipine Continue minoxidil for resistant hypertension  ESRD on HD MWF HD per nephro Replace electrolytes as indicated Avoid nephrotoxic agents, ensure adequate renal perfusion  Acute on chronic anemia in ESRD EPO with dialysis per nephro Trend CBC Transfuse if hemoglobin less than 7  Tobacco dependence Continue nicotine patch to prevent nicotine withdrawal Smoking cessation counseling once stable  Hyponatremia Resolved  Best Practice (right click and "Reselect all SmartList Selections" daily)   Per [facility] See Amion for pager If no response to pager, please call [phone] until 7pm After 7pm, Please call E-link [phone]

## 2021-03-14 NOTE — Progress Notes (Signed)
Patient ID: Travis Long, male   DOB: [birth year], 35 y.o.   MRN: [id number]  PROGRESS NOTE    Travis Long  LOV:[id number] DOB: [birth year] DOA: 03/11/2021 PCP: Pcp, No   Brief Narrative:  35 year old male with history of hypertension, end-stage renal disease on hemodialysis, chronic diastolic CHF, anemia of chronic disease presented to [facility] on 03/06/2021 for worsening shortness of breath after missing hemodialysis sessions.  During work-up chest x-ray showed left sided large pleural effusion; thoracentesis was performed.  Nephrology and pulmonary were consulted.  He underwent inpatient hemodialysis.  Chest tube was placed and he subsequently received tPA: Pulmonary recommended transfer to [facility] for CT surgery evaluation for loculated pleural effusion.  He apparently was IVC'd by  psychiatry on 03/10/2021.  He was transferred to [facility] and was admitted by [facility] service.  Cardiothoracic surgery evaluated the patient and recommended conservative management.  He was transferred to [facility] service from 03/13/2021 onwards.  Assessment & Plan:   Loculated left pleural effusion Acute respiratory failure with hypoxia -Status post thoracentesis on 03/06/2021 with removal of 750 cc fluid -Status post chest tube placement on 03/08/2021 and received tPA on 03/08/2021 and 03/09/2021: Imaging did not improve.  Pulmonary recommended transfer to [facility]. -He was transferred to [facility] and was admitted by [facility] service.  Cardiothoracic surgery evaluated the patient and recommended conservative management.  He was transferred to [facility] service from 03/13/2021 onwards. -Pulmonary following and managing chest tube. -Currently on 2 L oxygen via nasal cannula  Currently acquired pneumonia -Currently afebrile.  Has already received 7 days of Unasyn therapy.  DC'd antibiotics on 03/13/2021.  Acute on chronic diastolic CHF -Echo on 88/41/6606: EF of 60 to 65% with grade 2 diastolic  dysfunction. -Fistulogram on 03/09/2021: Normal flow, no evidence of high-output heart failure -Volume managed by dialysis.  Strict input and output.  Daily weights.  Fluid restriction.  Stress reaction causing mixed disturbance of emotion and conduct -Patient was IVC'd by  psychiatry on 03/10/2021.   -IVC rescinded on 03/13/2021 after psychiatry evaluation.  Hypertensive urgency -Due to noncompliance with medications.  Blood pressure on the higher side but improving.  Continue amlodipine, Coreg, hydralazine and minoxidil.  End-stage renal disease on hemodialysis -Nephrology following.  Dialysis as per nephrology schedule.  Anemia of chronic disease -Hemoglobin 8.5 today.  Transfuse if hemoglobin is less than 7.  Medical noncompliance -Patient intermittently declines recommended care during hospital admission including blood draws.  DVT prophylaxis: Heparin subcutaneous Code Status: Full Family Communication: None at bedside Disposition Plan: Status is: Inpatient  Remains inpatient appropriate because: Of severity of illness  Consultants: PCCM/cardiothoracic surgery/nephrology/psychiatry   Procedures: Chest tube placed on 03/08/2021 at [facility]  Antimicrobials:  Anti-infectives (From admission, onward)    Start     Dose/Rate Route Frequency Ordered Stop   03/11/21 1000  Ampicillin-Sulbactam (UNASYN) 3 g in sodium chloride 0.9 % 100 mL IVPB  Status:  Discontinued        3 g 200 mL/hr over 30 Minutes Intravenous Every 12 hours 03/11/21 0543 03/13/21 1122        Subjective: Patient seen and examined at bedside.  Poor historian.  No fever, worsening shortness of breath, chest pain reported. Objective: Vitals:   03/14/21 0300 03/14/21 0400 03/14/21 0500 03/14/21 0600  BP:  (!) 165/90  (!) 159/94  Pulse:      Resp: (!) 24 19 (!) 27 (!) 21  Temp:  99.2 F (37.3 C)    TempSrc:  Oral    SpO2:  95%    Weight:   85.4 kg     Intake/Output Summary (Last 24 hours) at  03/14/2021 0729 Last data filed at 03/14/2021 0600 Gross per 24 hour  Intake 1007.79 ml  Output 2154 ml  Net -1146.21 ml    Filed Weights   03/13/21 1115 03/13/21 1500 03/14/21 0500  Weight: 87 kg 85.2 kg 85.4 kg    Examination:  General exam: On 2 L oxygen via nasal cannula.  No distress.  Respiratory system: Decreased breath sounds at bases bilaterally with some scattered crackles; left-sided chest tube present; intermittently tachypneic Cardiovascular system: Currently rate controlled; S1-S2 heard Gastrointestinal system: Abdomen is distended slightly, soft and nontender.  Bowel sounds are heard Extremities: Mild lower extremity edema present; no cyanosis Central nervous system: Awake and alert. No focal neurological deficits.  Moves extremities  skin: No obvious petechiae/rashes  psychiatry: Flat affect.  Data Reviewed: I have personally reviewed following labs and imaging studies  CBC: Recent Labs  Lab 03/10/21 1541 03/11/21 0647 03/13/21 0945 03/13/21 1838 03/14/21 0525  WBC 7.9 8.4 8.7 8.4 9.2  NEUTROABS  --   --  6.3  --  6.0  HGB 6.5* 7.0* 7.3* 9.1* 8.5*  HCT 20.2* 22.2* 22.7* 29.0* 26.5*  MCV 89.8 91.7 92.3 93.5 94.3  PLT 204 217 261 360 676    Basic Metabolic Panel: Recent Labs  Lab 03/08/21 0930 03/09/21 1319 03/10/21 1541 03/11/21 0647 03/13/21 0945 03/13/21 1838 03/14/21 0525  NA 134*   < > 133* 137 136 134* 136  K 3.8   < > 5.3* 5.1 3.8 3.5 3.6  CL 95*   < > 91* 96* 85* 91* 92*  CO2 26   < > 31 27 34* 31 30  GLUCOSE 86   < > 97 109* 161* 154* 93  BUN 41*   < > 49* 26* 42* 16 20  CREATININE 9.65*   < > 15.69* 10.63* 15.82* 8.74* 10.50*  CALCIUM 8.0*   < > 8.2* 8.8* 7.8* 8.9 9.0  PHOS 3.8  --  8.0*  --   --  4.0  --    < > = values in this interval not displayed.    GFR: Estimated Creatinine Clearance: 10.8 mL/min (A) (by C-G formula based on SCr of 10.5 mg/dL (H)). Liver Function Tests: Recent Labs  Lab 03/08/21 0930 03/10/21 1541  03/13/21 1838  ALBUMIN 3.0* 3.1* 3.4*    No results for input(s): LIPASE, AMYLASE in the last 168 hours. No results for input(s): AMMONIA in the last 168 hours. Coagulation Profile: No results for input(s): INR, PROTIME in the last 168 hours.  Cardiac Enzymes: No results for input(s): CKTOTAL, CKMB, CKMBINDEX, TROPONINI in the last 168 hours. BNP (last 3 results) No results for input(s): PROBNP in the last 8760 hours. HbA1C: No results for input(s): HGBA1C in the last 72 hours. CBG: No results for input(s): GLUCAP in the last 168 hours. Lipid Profile: No results for input(s): CHOL, HDL, LDLCALC, TRIG, CHOLHDL, LDLDIRECT in the last 72 hours. Thyroid Function Tests: No results for input(s): TSH, T4TOTAL, FREET4, T3FREE, THYROIDAB in the last 72 hours. Anemia Panel: No results for input(s): VITAMINB12, FOLATE, FERRITIN, TIBC, IRON, RETICCTPCT in the last 72 hours. Sepsis Labs: No results for input(s): PROCALCITON, LATICACIDVEN in the last 168 hours.  Recent Results (from the past 240 hour(s))  Resp Panel by RT-PCR (Flu A&B, Covid) Nasopharyngeal Swab     Status: None  Collection Time: 03/06/21  6:45 AM   Specimen: Nasopharyngeal Swab; Nasopharyngeal(NP) swabs in vial transport medium  Result Value Ref Range Status   SARS Coronavirus 2 by RT PCR NEGATIVE NEGATIVE Final    Comment: (NOTE) SARS-CoV-2 target nucleic acids are NOT DETECTED.  The SARS-CoV-2 RNA is generally detectable in upper respiratory specimens during the acute phase of infection. The lowest concentration of SARS-CoV-2 viral copies this assay can detect is 138 copies/mL. A negative result does not preclude SARS-Cov-2 infection and should not be used as the sole basis for treatment or other patient management decisions. A negative result may occur with  improper specimen collection/handling, submission of specimen other than nasopharyngeal swab, presence of viral mutation(s) within the areas targeted by this  assay, and inadequate number of viral copies(<138 copies/mL). A negative result must be combined with clinical observations, patient history, and epidemiological information. The expected result is Negative.  Fact Sheet for Patients:  EntrepreneurPulse.com.au  Fact Sheet for Healthcare Providers:  IncredibleEmployment.be  This test is no t yet approved or cleared by the [place] FDA and  has been authorized for detection and/or diagnosis of SARS-CoV-2 by FDA under an Emergency Use Authorization (EUA). This EUA will remain  in effect (meaning this test can be used) for the duration of the COVID-19 declaration under Section 564(b)(1) of the Act, 21 U.S.C.section 360bbb-3(b)(1), unless the authorization is terminated  or revoked sooner.       Influenza A by PCR NEGATIVE NEGATIVE Final   Influenza B by PCR NEGATIVE NEGATIVE Final    Comment: (NOTE) The Xpert Xpress SARS-CoV-2/FLU/RSV plus assay is intended as an aid in the diagnosis of influenza from Nasopharyngeal swab specimens and should not be used as a sole basis for treatment. Nasal washings and aspirates are unacceptable for Xpert Xpress SARS-CoV-2/FLU/RSV testing.  Fact Sheet for Patients: EntrepreneurPulse.com.au  Fact Sheet for Healthcare Providers: IncredibleEmployment.be  This test is not yet approved or cleared by the [place] FDA and has been authorized for detection and/or diagnosis of SARS-CoV-2 by FDA under an Emergency Use Authorization (EUA). This EUA will remain in effect (meaning this test can be used) for the duration of the COVID-19 declaration under Section 564(b)(1) of the Act, 21 U.S.C. section 360bbb-3(b)(1), unless the authorization is terminated or revoked.  Performed at [facility], [address] [place], [place] [address]   Body fluid culture w Gram Stain     Status: None   Collection Time: 03/06/21   8:40 AM   Specimen: PATH Cytology Pleural fluid  Result Value Ref Range Status   Specimen Description   Final    PLEURAL Performed at [facility], [address], [place], [place] [address]    Special Requests   Final    NONE Performed at [facility], [address] [place], [place] [address]    Gram Stain   Final    RARE WBC PRESENT, PREDOMINANTLY MONONUCLEAR NO ORGANISMS SEEN    Culture   Final    NO GROWTH 3 DAYS Performed at [facility], [place] [address], [place], [place] [address]    Report Status 03/09/2021 FINAL  Final  MRSA Next Gen by PCR, Nasal     Status: None   Collection Time: 03/07/21  5:31 AM   Specimen: Nasal Mucosa; Nasal Swab  Result Value Ref Range Status   MRSA by PCR Next Gen NOT DETECTED NOT DETECTED Final    Comment: (NOTE) The GeneXpert MRSA Assay (FDA  approved for NASAL specimens only), is one component of a comprehensive MRSA colonization surveillance program. It is not intended to diagnose MRSA infection nor to guide or monitor treatment for MRSA infections. Test performance is not FDA approved in patients less than 61 years old. Performed at [facility], [address] [place], [place] [address]   MRSA Next Gen by PCR, Nasal     Status: None   Collection Time: 03/11/21  5:09 AM   Specimen: Nasal Mucosa; Nasal Swab  Result Value Ref Range Status   MRSA by PCR Next Gen NOT DETECTED NOT DETECTED Final    Comment: (NOTE) The GeneXpert MRSA Assay (FDA approved for NASAL specimens only), is one component of a comprehensive MRSA colonization surveillance program. It is not intended to diagnose MRSA infection nor to guide or monitor treatment for MRSA infections. Test performance is not FDA approved in patients less than 57 years old. Performed at [facility], [place] [address] [place], [place] [address]           Radiology Studies: DG CHEST PORT 1 VIEW  Result Date: 03/12/2021 CLINICAL DATA:   Pleural effusion.  Follow-up exam.  Left chest tube. EXAM: PORTABLE CHEST 1 VIEW COMPARISON:  03/11/2021 and older studies. FINDINGS: Loculated right pneumothorax, evident at the apex, unchanged from the previous day's study. Stable left mid inferior hemithorax pigtail chest tube. Opacity extends along the periphery of the left lung consistent with residual pleural fluid. There are linear and patchy airspace opacities within the aerated left lung likely due to atelectasis. Improved aeration of the right lung with an interval decrease in hazy airspace opacity. There is persistent bilateral interstitial thickening. Small right pleural effusion, incompletely imaged on the study. No right pneumothorax. Stable right internal jugular tunneled dual lumen central venous catheter. IMPRESSION: 1. Improved right lung aeration with decreased hazy airspace opacity. 2. No other change. 3. Persistent loculated left pneumothorax at the apex. No change in the residual left pleural effusion or in the interstitial and airspace left lung opacities. 4. Stable left chest tube. Electronically Signed   By: Lajean Manes M.D.   On: 03/12/2021 12:43        Scheduled Meds:  amLODipine  10 mg Oral Daily   carvedilol  25 mg Oral BID WC   Chlorhexidine Gluconate Cloth  6 each Topical Q0600   darbepoetin (ARANESP) injection - DIALYSIS  100 mcg Intravenous Q Mon-HD   heparin  5,000 Units Subcutaneous Q8H   hydrALAZINE  100 mg Oral Q8H   mouth rinse  15 mL Mouth Rinse BID   minoxidil  5 mg Oral Daily   pantoprazole (PROTONIX) IV  40 mg Intravenous Q12H   Continuous Infusions:  sodium chloride     sodium chloride            Aline August, MD [facility] 03/14/2021, 7:29 AM

## 2021-03-15 ENCOUNTER — Inpatient Hospital Stay (HOSPITAL_COMMUNITY): Payer: Medicare Other

## 2021-03-15 DIAGNOSIS — I160 Hypertensive urgency: Secondary | ICD-10-CM

## 2021-03-15 DIAGNOSIS — J948 Other specified pleural conditions: Secondary | ICD-10-CM

## 2021-03-15 DIAGNOSIS — F4320 Adjustment disorder, unspecified: Secondary | ICD-10-CM

## 2021-03-15 DIAGNOSIS — I5033 Acute on chronic diastolic (congestive) heart failure: Secondary | ICD-10-CM

## 2021-03-15 DIAGNOSIS — J189 Pneumonia, unspecified organism: Secondary | ICD-10-CM

## 2021-03-15 LAB — CBC WITH DIFFERENTIAL/PLATELET
Abs Immature Granulocytes: 0.08 10*3/uL — ABNORMAL HIGH (ref 0.00–0.07)
Basophils Absolute: 0.1 10*3/uL (ref 0.0–0.1)
Basophils Relative: 1 %
Eosinophils Absolute: 0.8 10*3/uL — ABNORMAL HIGH (ref 0.0–0.5)
Eosinophils Relative: 10 %
HCT: 25.4 % — ABNORMAL LOW (ref 39.0–52.0)
Hemoglobin: 8.1 g/dL — ABNORMAL LOW (ref 13.0–17.0)
Immature Granulocytes: 1 %
Lymphocytes Relative: 18 %
Lymphs Abs: 1.4 10*3/uL (ref 0.7–4.0)
MCH: 30.1 pg (ref 26.0–34.0)
MCHC: 31.9 g/dL (ref 30.0–36.0)
MCV: 94.4 fL (ref 80.0–100.0)
Monocytes Absolute: 1.1 10*3/uL — ABNORMAL HIGH (ref 0.1–1.0)
Monocytes Relative: 14 %
Neutro Abs: 4.7 10*3/uL (ref 1.7–7.7)
Neutrophils Relative %: 56 %
Platelets: 309 10*3/uL (ref 150–400)
RBC: 2.69 MIL/uL — ABNORMAL LOW (ref 4.22–5.81)
RDW: 15.5 % (ref 11.5–15.5)
WBC: 8.1 10*3/uL (ref 4.0–10.5)
nRBC: 0 % (ref 0.0–0.2)

## 2021-03-15 LAB — HEPATITIS B SURFACE ANTIBODY, QUANTITATIVE: Hep B S AB Quant (Post): 16.1 m[IU]/mL (ref >9.9)

## 2021-03-15 LAB — RENAL FUNCTION PANEL
Albumin: 2.9 g/dL — ABNORMAL LOW (ref 3.5–5.0)
Anion gap: 14 (ref 5–15)
BUN: 29 mg/dL — ABNORMAL HIGH (ref 6–20)
CO2: 30 mmol/L (ref 22–32)
Calcium: 8.6 mg/dL — ABNORMAL LOW (ref 8.9–10.3)
Chloride: 89 mmol/L — ABNORMAL LOW (ref 98–111)
Creatinine, Ser: 13.28 mg/dL — ABNORMAL HIGH (ref 0.61–1.24)
GFR, Estimated: 5 mL/min — ABNORMAL LOW (ref >60)
Glucose, Bld: 93 mg/dL (ref 70–99)
Phosphorus: 7.2 mg/dL — ABNORMAL HIGH (ref 2.5–4.6)
Potassium: 3.8 mmol/L (ref 3.5–5.1)
Sodium: 133 mmol/L — ABNORMAL LOW (ref 135–145)

## 2021-03-15 MED ORDER — DICYCLOMINE HCL 10 MG PO CAPS: 10.0000 mg | ORAL_CAPSULE | Freq: Four times a day (QID) | ORAL | 0 refills | Status: AC | PRN

## 2021-03-15 MED ORDER — CARVEDILOL 25 MG PO TABS
25.0000 mg | ORAL_TABLET | Freq: Two times a day (BID) | ORAL | 1 refills | Status: AC
Start: 2021-03-15 — End: ?

## 2021-03-15 MED ORDER — PANTOPRAZOLE SODIUM 40 MG PO TBEC: 40.0000 mg | DELAYED_RELEASE_TABLET | Freq: Every day | ORAL | 1 refills | Status: DC

## 2021-03-15 MED ORDER — HYDRALAZINE HCL 100 MG PO TABS: 100.0000 mg | ORAL_TABLET | Freq: Three times a day (TID) | ORAL | 1 refills | Status: AC

## 2021-03-15 MED ORDER — PANTOPRAZOLE SODIUM 40 MG PO TBEC: 40.0000 mg | DELAYED_RELEASE_TABLET | Freq: Two times a day (BID) | ORAL | Status: DC

## 2021-03-15 NOTE — Progress Notes (Incomplete)
After summary visit review with patient.  All questions answer.  Patient discharge home.

## 2021-03-15 NOTE — Discharge Summary (Signed)
Physician Discharge Summary  Travis Long ZSW:[id number] DOB: 09-May-1985 DOA: 03/11/2021  PCP: Merryl Hacker, No  Admit date: 03/11/2021 Discharge date: 03/15/2021 Admitted From: Home Disposition: Home Recommendations for Outpatient Follow-up:  Pulmonology to arrange outpatient follow-up. Please obtain CBC/BMP/Mag at follow up Please follow up on the following pending results: None Home Health: Not indicated Equipment/Devices: Not indicated Discharge Condition: Stable CODE STATUS: Full code  Hospital Course: 35 year old M with PMH of ESRD on HD MWF, diastolic CHF, anemia of chronic disease, HTN and mood disorder presented to [facility] on 03/06/2021 with worsening shortness of breath and hypertensive urgency after missed HD sessions and found to have left-sided large pleural effusion and community-acquired pneumonia.  He was started on IV Unasyn.  He underwent thoracocentesis with removal of 750 cc transudative and culture negative fluid on 03/06/2021.  He also had chest tube placed on 12/14 and tPA administered on 12/14 and 12/15.  He also had hemodialysis sessions.  However, repeat imaging did not show improvement in pleural effusion/loculation.  Patient was transferred to [facility] for CVTS evaluation per recommendation per pulmonology.   Patient was evaluated by CVTS who recommended conservative management.  Eventually, patient's was transferred to [facility] hospitalist service on 03/13/2021.   Patient's respiratory symptoms resolved.  Chest tube removed on 03/15/2021.  He was cleared for discharge by pulmonology.  Pulmonology to arrange outpatient follow-up.  Of note, patient was initially IVC by psychiatry on 03/10/2021 due to mixed disturbance of emotion and conduct in the setting of stress reaction.  IVC rescinded on 03/13/2021 after evaluation by psychiatry.  See individual problem list below for more on hospital course.  Discharge Diagnoses:  Loculated left pleural effusion-status post  thoracocentesis and chest tube Acute respiratory failure with hypoxia: Resolved. -Outpatient follow-up with pulmonology   Currently acquired pneumonia-completed 7 days of Unasyn on 12/19   Acute on chronic diastolic CHF/volume overload/ESRD on HD MWF: TTE with LVEF of 60 to 65% and G2 DD. -Fistulogram on 03/09/2021: Normal flow, no evidence of high-output heart failure -Outpatient HD MWF.   Stress reaction causing mixed disturbance of emotion and conduct -Patient was IVC'd by  psychiatry on 03/10/2021.   -IVC rescinded on 03/13/2021 after psychiatry evaluation.   Hypertensive urgency: Resolved. -Antihypertensive meds adjusted as below   Anemia of chronic disease: Stable. -Recheck CBC in 1 to 2 weeks   Medical noncompliance -Counseled.  Body mass index is 27.38 kg/m.           Discharge Exam: Vitals:   03/15/21 1140 03/15/21 1200 03/15/21 1207 03/15/21 1258  BP: 127/76  (!) 160/79   Pulse: (!) 114  (!) 113   Temp:  Comment: off unit  99.2 F (37.3 C)  Resp: 20     Weight:      SpO2:   95%   TempSrc:    Oral  BMI (Calculated):         GENERAL: No apparent distress.  Nontoxic. HEENT: MMM.  Vision and hearing grossly intact.  NECK: Supple.  No apparent JVD.  RESP: 95% on RA.  No IWOB.  Fair aeration bilaterally. CVS:  RRR. Heart sounds normal.  ABD/GI/GU: Bowel sounds present. Soft. Non tender.  MSK/EXT:  Moves extremities. No apparent deformity. No edema.  SKIN: no apparent skin lesion or wound NEURO: Awake and alert.  Oriented appropriately.  No apparent focal neuro deficit. PSYCH: Calm. Normal affect.   Discharge Instructions  Discharge Instructions     Call MD for:  difficulty breathing, headache or visual  disturbances   Complete by: As directed    Call MD for:  extreme fatigue   Complete by: As directed    Call MD for:  persistant dizziness or light-headedness   Complete by: As directed    Call MD for:  persistant nausea and vomiting   Complete by:  As directed    Call MD for:  severe uncontrolled pain   Complete by: As directed    Call MD for:  temperature >100.4   Complete by: As directed    Diet - low sodium heart healthy   Complete by: As directed    Discharge instructions   Complete by: As directed    It has been a pleasure taking care of you!  You were hospitalized due to shortness of breath likely from left pleural effusion and pneumonia for which you have been treated surgically with chest tube placement and antibiotics.  Continue outpatient dialysis.  Please review your new medication list and the directions on your medications before you take them.  Follow-up with your primary care doctor in 1 to 2 weeks or sooner if needed.  Follow-up with the pulmonologist as recommended to you.   Take care,   Increase activity slowly   Complete by: As directed       Allergies as of 03/15/2021   No Known Allergies      Medication List     STOP taking these medications    furosemide 80 MG tablet Commonly known as: LASIX   losartan 100 MG tablet Commonly known as: COZAAR       TAKE these medications    amLODipine 10 MG tablet Commonly known as: NORVASC Take 1 tablet (10 mg total) by mouth daily.   calcium acetate 667 MG capsule Commonly known as: PHOSLO Take 2 capsules (1,334 mg total) by mouth 3 (three) times daily with meals.   carvedilol 25 MG tablet Commonly known as: COREG Take 1 tablet (25 mg total) by mouth 2 (two) times daily with a meal. What changed:  medication strength how much to take   dicyclomine 10 MG capsule Commonly known as: BENTYL Take 1 capsule (10 mg total) by mouth every 6 (six) hours as needed (nausea, emesis).   epoetin alfa 10000 UNIT/ML injection Commonly known as: EPOGEN Inject 0.4 mLs (4,000 Units total) into the vein every Monday, Wednesday, and Friday with hemodialysis.   hydrALAZINE 100 MG tablet Commonly known as: APRESOLINE Take 1 tablet (100 mg total) by mouth every 8  (eight) hours.   minoxidil 2.5 MG tablet Commonly known as: LONITEN Take 2 tablets (5 mg total) by mouth daily.   pantoprazole 40 MG tablet Commonly known as: PROTONIX Take 1 tablet (40 mg total) by mouth daily.        Consultations: Pulmonology Nephrology Cardiothoracic surgery Psychiatry  Procedures/Studies: 12/12-left-sided thoracocentesis 12/14-left-sided chest tube   CT CHEST WO CONTRAST  Result Date: 03/06/2021 CLINICAL DATA:  Pneumonia, left pleural effusion and status post left thoracentesis with removal of 750 mL of fluid. Post thoracentesis chest x-ray shows a small amount new air in the pleural space towards the apex. EXAM: CT CHEST WITHOUT CONTRAST TECHNIQUE: Multidetector CT imaging of the chest was performed following the standard protocol without IV contrast. COMPARISON:  Chest x-ray earlier today. FINDINGS: Cardiovascular: The heart size is within normal limits. No pericardial fluid identified. The thoracic aorta and central pulmonary arteries are normal in caliber. A tunneled dialysis catheter is present inserted via the lower right internal jugular vein  and extending to the SVC/RA junction. Mediastinum/Nodes: Multiple scattered mediastinal lymph nodes are visualized. The largest in the right lower paratracheal region measure approximately 12 mm in maximum short axis diameter. Lymph nodes are most likely reactive given acute pulmonary findings. The thyroid gland appears unremarkable. Lungs/Pleura: There is consolidation of the left lower lobe likely consistent with acute pneumonia. Associated loculated parapneumonic pleural fluid on the left may be consistent with empyema. A small amount of air is present at the apex posteriorly in the pleural space likely introduced at the time of the thoracentesis procedure. This does not represent significant pneumothorax. There is a small right pleural effusion. Potential component of subtle pneumonia at the right lung base. Upper  Abdomen: No acute abnormality. Musculoskeletal: No chest wall mass or suspicious bone lesions identified. IMPRESSION: 1. Left lower lobe consolidation consistent with acute pneumonia. Associated loculated parapneumonic pleural fluid may be consistent with developing empyema. A small amount of pleural air at the apex may have been introduced at the time of the thoracentesis and does not represent a significant pneumothorax. 2. Small right pleural effusion with possible component of subtle pneumonia at the right lung base. 3. Multiple scattered small mediastinal lymph nodes which may be reactive given pulmonary findings. Electronically Signed   By: Aletta Edouard M.D.   On: 03/06/2021 10:02   IR Catheter Tube Change  Result Date: 03/09/2021 CLINICAL DATA:  Inadvertent partial retraction of left-sided chest tube. Please perform fluoroscopic guided exchange, up sizing and repositioning. EXAM: IR CATHETER TUBE CHANGE COMPARISON:  CT-guided left-sided chest tube placement-03/08/2021 Chest radiograph-earlier same day CONTRAST:  None MEDICATIONS: None. ANESTHESIA/SEDATION: None FLUOROSCOPY TIME:  18 seconds (4.2 mGy) TECHNIQUE: Patient was positioned supine, slightly RPO on the fluoroscopy table. The external portion of the existing left-sided chest tube as well as the surrounding skin was prepped and draped in usual sterile fashion. A preprocedural spot fluoroscopic image was obtained of the existing percutaneous drainage catheter. A small amount of contrast was injected via the existing percutaneous drainage catheter and several fluoroscopic images were obtained in various obliquities. The external portion of the chest tube was cut and cannulated with a short Amplatz wire. Under intermittent fluoroscopic guidance, the existing 14 French chest tube was exchanged for a new 16 French percutaneous drainage catheter with end coiled and locked within the more superior aspect of the left hemithorax. The new left-sided  chest tube was connected to a pleura vac device and secured in place with 2 interrupted sutures and a StatLock device. A Vaseline gauze dressing was applied. The patient tolerated the procedure well without immediate postprocedural complication. FINDINGS: Preprocedural spot fluoroscopic image demonstrates partial retraction of recently placed left-sided chest tube. After fluoroscopic guided exchange, repositioning and up sizing, the new 16 French chest tube is more ideally positioned within the more cranial aspect of the left pleural space. IMPRESSION: Successful fluoroscopic guided exchange, up sizing and repositioning of now 16 French left-sided chest tube. PLAN: - Continued chest tube management as per the critical care team. Electronically Signed   By: Sandi Mariscal M.D.   On: 03/09/2021 17:00   PERIPHERAL VASCULAR CATHETERIZATION  Result Date: 03/09/2021 See surgical note for result.  DG CHEST PORT 1 VIEW  Result Date: 03/15/2021 CLINICAL DATA:  Pleural effusion EXAM: PORTABLE CHEST 1 VIEW COMPARISON:  03/14/2021 FINDINGS: LEFT chest tube in place. Loculated effusions within the LEFT hemithorax. Rounded atelectasis in the LEFT lower lobe not changed. Small volume of gas at the LEFT lung apex. Small RIGHT effusion  unchanged. Large-bore central venous line with tip in distal SVC. IMPRESSION: 1. No interval change. 2. Loculated LEFT effusions and dense atelectasis. Electronically Signed   By: Suzy Bouchard M.D.   On: 03/15/2021 08:17   DG CHEST PORT 1 VIEW  Result Date: 03/14/2021 CLINICAL DATA:  Pleural effusion, chest tube EXAM: PORTABLE CHEST 1 VIEW COMPARISON:  03/12/2021 FINDINGS: Unchanged right IJ approach central line. Left chest tube remains present. Left hydropneumothorax as before. There is decreased lucency suggesting reduced pneumothorax component. Similar right pleural effusion and adjacent atelectasis. Patchy left lung opacities are unchanged. Similar cardiomediastinal contours.  IMPRESSION: Left hydropneumothorax with decreased lucency suggesting reduced pneumothorax component. Similar patchy left lung opacities and right pleural effusion and adjacent atelectasis. Electronically Signed   By: Macy Mis M.D.   On: 03/14/2021 08:15   DG CHEST PORT 1 VIEW  Result Date: 03/12/2021 CLINICAL DATA:  Pleural effusion.  Follow-up exam.  Left chest tube. EXAM: PORTABLE CHEST 1 VIEW COMPARISON:  03/11/2021 and older studies. FINDINGS: Loculated right pneumothorax, evident at the apex, unchanged from the previous day's study. Stable left mid inferior hemithorax pigtail chest tube. Opacity extends along the periphery of the left lung consistent with residual pleural fluid. There are linear and patchy airspace opacities within the aerated left lung likely due to atelectasis. Improved aeration of the right lung with an interval decrease in hazy airspace opacity. There is persistent bilateral interstitial thickening. Small right pleural effusion, incompletely imaged on the study. No right pneumothorax. Stable right internal jugular tunneled dual lumen central venous catheter. IMPRESSION: 1. Improved right lung aeration with decreased hazy airspace opacity. 2. No other change. 3. Persistent loculated left pneumothorax at the apex. No change in the residual left pleural effusion or in the interstitial and airspace left lung opacities. 4. Stable left chest tube. Electronically Signed   By: Lajean Manes M.D.   On: 03/12/2021 12:43   DG CHEST PORT 1 VIEW  Result Date: 03/11/2021 CLINICAL DATA:  Loculated pleural effusion which chest tube EXAM: PORTABLE CHEST 1 VIEW COMPARISON:  Yesterday FINDINGS: Complex left pleural effusion with apical and basal pneumothorax outlining pleural thickening. Cardiomegaly and hazy right chest from pleural fluid and atelectasis. Dialysis catheter on the right with tip at the upper cavoatrial junction. IMPRESSION: Loculated left pneumothorax with increased apical  component since yesterday. Elsewhere stable chest and hardware positioning. Electronically Signed   By: Jorje Guild M.D.   On: 03/11/2021 06:16   DG Chest Port 1 View  Result Date: 03/10/2021 CLINICAL DATA:  Hypoxia. Recent chest tube placement for left-sided pneumothorax. EXAM: PORTABLE CHEST 1 VIEW COMPARISON:  Radiographs 03/09/2021 and 03/07/2021.  CT 03/06/2021. FINDINGS: 0921 hours. The recently upsized left chest tube is better positioned, overlying the medial left chest. The loculated left hydropneumothorax appears slightly smaller. Underlying small bilateral pleural effusions and bilateral airspace opacities are unchanged. The heart size and mediastinal contours are stable. Right IJ central venous catheter projects to the superior cavoatrial junction. IMPRESSION: Improved position of small left-sided chest tube with slight improvement in loculated hydropneumothorax at the left costophrenic angle. No other significant changes. Electronically Signed   By: Richardean Sale M.D.   On: 03/10/2021 09:39   DG Chest Port 1 View  Result Date: 03/09/2021 CLINICAL DATA:  Pleural effusion EXAM: PORTABLE CHEST 1 VIEW COMPARISON:  Chest x-ray dated December 13th 2022 FINDINGS: Visualized cardiac and mediastinal contours are unchanged. Small left hydropneumothorax with chest tube in place. Small right pleural effusion. Right chest wall central  venous catheter with tip positioned at the superior cavoatrial junction. Bilateral interstitial opacities, likely due to pulmonary edema. IMPRESSION: Small left hydropneumothorax with chest tube in place. Electronically Signed   By: Yetta Glassman M.D.   On: 03/09/2021 13:58   DG Chest Port 1 View  Result Date: 03/07/2021 CLINICAL DATA:  Lucas's/pneumonia EXAM: PORTABLE CHEST 1 VIEW COMPARISON:  Previous studies including the examination of 03/06/2022 FINDINGS: Transverse diameter of heart is increased. Moderate to large left pleural effusion is present. There is  small pocket of air in the left apical region with possible slight decrease. There is no evidence of right pneumothorax. Central pulmonary vessels are prominent. There is prominence of interstitial markings in the right parahilar region and right lower lung fields, possibly suggesting interstitial edema. Evaluation of left lung for infiltrates or pulmonary edema is limited due to overlying large left pleural effusion. Tip of dialysis catheter is seen in the right atrium close to its junction with superior vena cava. IMPRESSION: Moderate to large partly loculated left pleural effusion with no significant interval change. Small right pleural effusion. There are linear densities in the left mid and both lower lung fields suggesting atelectasis/pneumonia. Cardiomegaly. Central pulmonary vessels are prominent suggesting CHF. Electronically Signed   By: Elmer Picker M.D.   On: 03/07/2021 13:42   DG Chest Port 1 View  Result Date: 03/06/2021 CLINICAL DATA:  Post thoracentesis on the left. EXAM: PORTABLE CHEST 1 VIEW COMPARISON:  Same day chest radiograph. FINDINGS: No substantial change in size of a left pleural effusion, which is likely loculated. New small area of gas within the superior aspect of the left pleural effusion with air-fluid level. similar small right pleural effusion. Similar left lung opacification. Cardiac silhouette is largely obscured. Similar positioning of a right IJ dialysis catheter. IMPRESSION: 1. No substantial change in size of a left pleural effusion, which is likely loculated. New small area of gas within the superior aspect of the left pleural effusion with air-fluid level, probably introduced during interval thoracentesis. Recommend follow-up chest radiograph two ensure stability/resolution and exclude developing pneumothorax. 2. Similar left lung opacification, probably compressive atelectasis. Pneumonia is not excluded. 3. Similar small right pleural effusion. Findings discussed  with the patient's nurse [place] who will inform the physician. Electronically Signed   By: Margaretha Sheffield M.D.   On: 03/06/2021 09:15   DG Chest Portable 1 View  Result Date: 03/06/2021 CLINICAL DATA:  Chest pain EXAM: PORTABLE CHEST 1 VIEW COMPARISON:  12/01/2020 FINDINGS: Increased opacification of the left hemithorax likely due to large pleural effusion and associated atelectasis. Small right pleural effusion is also present with adjacent atelectasis. Cardiomediastinal contours are obscured. Right dialysis catheter is present. IMPRESSION: Increased opacification of left hemithorax likely due to large pleural effusion and associated atelectasis. There is also a small right pleural effusion with adjacent atelectasis. Electronically Signed   By: Macy Mis M.D.   On: 03/06/2021 07:54   ECHOCARDIOGRAM COMPLETE  Result Date: 03/09/2021    ECHOCARDIOGRAM REPORT   Patient Name:   Travis Long Date of Exam: 03/09/2021 Medical Rec #:  [id number]     Height:       69.0 in Accession #:    [id number]    Weight:       189.6 lb Date of Birth:  Feb 24, 1986     BSA:          2.020 m Patient Age:    35 years      BP:  145/87 mmHg Patient Gender: M             HR:           78 bpm. Exam Location:  [facility] Procedure: 2D Echo, Cardiac Doppler, Color Doppler and Strain Analysis Indications:     CHF-acute diastolic Q46.96  History:         Patient has no prior history of Echocardiogram examinations.                  Risk Factors:Hypertension. ESRD.  Sonographer:     Sherrie Sport Referring Phys:  [place] DEW Diagnosing Phys: Ida Rogue MD  Sonographer Comments: Global longitudinal strain was attempted. IMPRESSIONS  1. Left ventricular ejection fraction, by estimation, is 60 to 65%. The left ventricle has normal function. The left ventricle has no regional wall motion abnormalities. There is severe left ventricular hypertrophy. Left ventricular diastolic parameters  are consistent with Grade II diastolic  dysfunction (pseudonormalization). The average left ventricular global longitudinal strain is -8.5 %. The global longitudinal strain is abnormal.  2. Right ventricular systolic function is normal. The right ventricular size is normal. Moderately increased right ventricular wall thickness.  3. Left atrial size was mildly dilated.  4. A small pericardial effusion is present.  5. The mitral valve is normal in structure. No evidence of mitral valve regurgitation. No evidence of mitral stenosis.  6. The aortic valve is normal in structure. Aortic valve regurgitation is not visualized. No aortic stenosis is present.  7. The inferior vena cava is normal in size with greater than 50% respiratory variability, suggesting right atrial pressure of 3 mmHg. FINDINGS  Left Ventricle: Left ventricular ejection fraction, by estimation, is 60 to 65%. The left ventricle has normal function. The left ventricle has no regional wall motion abnormalities. The average left ventricular global longitudinal strain is -8.5 %. The  global longitudinal strain is abnormal. The left ventricular internal cavity size was normal in size. There is severe left ventricular hypertrophy. Left ventricular diastolic parameters are consistent with Grade II diastolic dysfunction (pseudonormalization). Right Ventricle: The right ventricular size is normal. Moderately increased right ventricular wall thickness. Right ventricular systolic function is normal. Left Atrium: Left atrial size was mildly dilated. Right Atrium: Right atrial size was normal in size. Pericardium: A small pericardial effusion is present. Mitral Valve: The mitral valve is normal in structure. No evidence of mitral valve regurgitation. No evidence of mitral valve stenosis. MV peak gradient, 8.1 mmHg. The mean mitral valve gradient is 5.0 mmHg. Tricuspid Valve: The tricuspid valve is normal in structure. Tricuspid valve regurgitation is mild . No evidence of tricuspid stenosis. Aortic Valve:  The aortic valve is normal in structure. Aortic valve regurgitation is not visualized. No aortic stenosis is present. Aortic valve mean gradient measures 4.0 mmHg. Aortic valve peak gradient measures 8.5 mmHg. Aortic valve area, by VTI measures 3.00 cm. Pulmonic Valve: The pulmonic valve was normal in structure. Pulmonic valve regurgitation is mild. No evidence of pulmonic stenosis. Aorta: The aortic root is normal in size and structure. Venous: The inferior vena cava is normal in size with greater than 50% respiratory variability, suggesting right atrial pressure of 3 mmHg. IAS/Shunts: No atrial level shunt detected by color flow Doppler.  LEFT VENTRICLE PLAX 2D LVIDd:         4.60 cm   Diastology LVIDs:         3.10 cm   LV e' medial:    6.20 cm/s LV PW:  1.80 cm   LV E/e' medial:  21.0 LV IVS:        1.20 cm   LV e' lateral:   4.79 cm/s LVOT diam:     2.00 cm   LV E/e' lateral: 27.1 LV SV:         68 LV SV Index:   33        2D Longitudinal Strain LVOT Area:     3.14 cm  2D Strain GLS Avg:     -8.5 %  RIGHT VENTRICLE RV Basal diam:  3.70 cm RV S prime:     16.20 cm/s TAPSE (M-mode): 4.2 cm LEFT ATRIUM             Index        RIGHT ATRIUM           Index LA diam:        4.10 cm 2.03 cm/m   RA Area:     17.30 cm LA Vol (A2C):   73.4 ml 36.34 ml/m  RA Volume:   53.60 ml  26.54 ml/m LA Vol (A4C):   66.7 ml 33.03 ml/m LA Biplane Vol: 75.5 ml 37.38 ml/m  AORTIC VALVE                    PULMONIC VALVE AV Area (Vmax):    2.73 cm     PV Vmax:          0.97 m/s AV Area (Vmean):   3.08 cm     PV Vmean:         73.600 cm/s AV Area (VTI):     3.00 cm     PV VTI:           0.231 m AV Vmax:           146.00 cm/s  PV Peak grad:     3.8 mmHg AV Vmean:          90.900 cm/s  PV Mean grad:     2.0 mmHg AV VTI:            0.225 m      PR End Diast Vel: 6.15 msec AV Peak Grad:      8.5 mmHg     RVOT Peak grad:   6 mmHg AV Mean Grad:      4.0 mmHg LVOT Vmax:         127.00 cm/s LVOT Vmean:        89.000 cm/s LVOT  VTI:          0.215 m LVOT/AV VTI ratio: 0.96  AORTA Ao Root diam: 3.17 cm MITRAL VALVE                TRICUSPID VALVE MV Area (PHT): 3.65 cm     TR Peak grad:   15.1 mmHg MV Area VTI:   2.04 cm     TR Vmax:        194.00 cm/s MV Peak grad:  8.1 mmHg MV Mean grad:  5.0 mmHg     SHUNTS MV Vmax:       1.42 m/s     Systemic VTI:  0.22 m MV Vmean:      108.0 cm/s   Systemic Diam: 2.00 cm MV Decel Time: 208 msec     Pulmonic VTI:  0.236 m MV E velocity: 130.00 cm/s MV A velocity: 115.00 cm/s MV E/A ratio:  1.13 Ida Rogue MD Electronically signed by Ida Rogue  MD Signature Date/Time: 03/09/2021/2:47:55 PM    Final    CT IMAGE GUIDED DRAINAGE BY PERCUTANEOUS CATHETER  Result Date: 03/11/2021 INDICATION: Empyema EXAM: CT-GUIDED CHEST TUBE PLACEMENT MEDICATIONS: No periprocedural antibiotics were indicated ANESTHESIA/SEDATION: Intravenous Fentanyl 129mcg and Versed 2mg  were administered as conscious sedation during continuous monitoring of the patient's level of consciousness and physiological / cardiorespiratory status by the radiology RN, with a total moderate sedation time of 20 minutes. COMPLICATIONS: None immediate. PROCEDURE: Informed written consent was obtained from the patient after a thorough discussion of the procedural risks, benefits and alternatives. All questions were addressed. Maximal Sterile Barrier Technique was utilized including caps, mask, sterile gowns, sterile gloves, sterile drape, hand hygiene and skin antiseptic. A timeout was performed prior to the initiation of the procedure. Select scans through the thorax obtained. Appropriate skin entry site was determined and marked. Region was prepped with chlorhexidine, draped in usual sterile fashion, infiltrated locally with 1% lidocaine. 5 [place] Yueh sheath needle was advanced into the pleural space. Fluid could be aspirated. Amplatz guidewire advanced easily. Tract dilated to facilitate placement of a 14 French pigtail drain catheter.  Catheter was placed to Pleur-Evac suction device. Confirmatory CT demonstrated good catheter position. The catheter was secured externally with 0 Prolene suture and StatLock and a sterile dressing applied. The patient tolerated the procedure well. IMPRESSION: 1. Technically successful CT-guided left chest tube placement. Electronically Signed   By: Lucrezia Europe M.D.   On: 03/11/2021 09:36   US THORACENTESIS ASP PLEURAL SPACE W/IMG GUIDE  Result Date: 03/06/2021 INDICATION: Patient with shortness of breath and left pleural effusion request received for thoracentesis. EXAM: ULTRASOUND GUIDED LEFT THORACENTESIS MEDICATIONS: Local 1% lidocaine only. COMPLICATIONS: None immediate. PROCEDURE: An ultrasound guided thoracentesis was thoroughly discussed with the patient and questions answered. The benefits, risks, alternatives and complications were also discussed. The patient understands and wishes to proceed with the procedure. Written consent was obtained. Ultrasound was performed to localize and mark an adequate pocket of fluid in the left chest. The area was then prepped and draped in the normal sterile fashion. 1% Lidocaine was used for local anesthesia. Under ultrasound guidance a 19 gauge, 7-cm, Yueh catheter was introduced. Thoracentesis was performed. The catheter was removed and a dressing applied. Procedure was stopped early secondary to chest pain with remaining small left pleural effusion seen postprocedure. FINDINGS: A total of approximately 750 mL of amber colored fluid was removed. Samples were sent to the laboratory as requested by the clinical team. IMPRESSION: Successful ultrasound guided left thoracentesis yielding 750 mL of pleural fluid. This exam was performed by Tsosie Billing PA-C, and was supervised and interpreted by Dr. Kathlene Cote. Electronically Signed   By: Aletta Edouard M.D.   On: 03/06/2021 09:39       The results of significant diagnostics from this hospitalization (including  imaging, microbiology, ancillary and laboratory) are listed below for reference.     Microbiology: Recent Results (from the past 240 hour(s))  Resp Panel by RT-PCR (Flu A&B, Covid) Nasopharyngeal Swab     Status: None   Collection Time: 03/06/21  6:45 AM   Specimen: Nasopharyngeal Swab; Nasopharyngeal(NP) swabs in vial transport medium  Result Value Ref Range Status   SARS Coronavirus 2 by RT PCR NEGATIVE NEGATIVE Final    Comment: (NOTE) SARS-CoV-2 target nucleic acids are NOT DETECTED.  The SARS-CoV-2 RNA is generally detectable in upper respiratory specimens during the acute phase of infection. The lowest concentration of SARS-CoV-2 viral copies this assay can detect is  138 copies/mL. A negative result does not preclude SARS-Cov-2 infection and should not be used as the sole basis for treatment or other patient management decisions. A negative result may occur with  improper specimen collection/handling, submission of specimen other than nasopharyngeal swab, presence of viral mutation(s) within the areas targeted by this assay, and inadequate number of viral copies(<138 copies/mL). A negative result must be combined with clinical observations, patient history, and epidemiological information. The expected result is Negative.  Fact Sheet for Patients:  EntrepreneurPulse.com.au  Fact Sheet for Healthcare Providers:  IncredibleEmployment.be  This test is no t yet approved or cleared by the [place] FDA and  has been authorized for detection and/or diagnosis of SARS-CoV-2 by FDA under an Emergency Use Authorization (EUA). This EUA will remain  in effect (meaning this test can be used) for the duration of the COVID-19 declaration under Section 564(b)(1) of the Act, 21 U.S.C.section 360bbb-3(b)(1), unless the authorization is terminated  or revoked sooner.       Influenza A by PCR NEGATIVE NEGATIVE Final   Influenza B by PCR NEGATIVE  NEGATIVE Final    Comment: (NOTE) The Xpert Xpress SARS-CoV-2/FLU/RSV plus assay is intended as an aid in the diagnosis of influenza from Nasopharyngeal swab specimens and should not be used as a sole basis for treatment. Nasal washings and aspirates are unacceptable for Xpert Xpress SARS-CoV-2/FLU/RSV testing.  Fact Sheet for Patients: EntrepreneurPulse.com.au  Fact Sheet for Healthcare Providers: IncredibleEmployment.be  This test is not yet approved or cleared by the [place] FDA and has been authorized for detection and/or diagnosis of SARS-CoV-2 by FDA under an Emergency Use Authorization (EUA). This EUA will remain in effect (meaning this test can be used) for the duration of the COVID-19 declaration under Section 564(b)(1) of the Act, 21 U.S.C. section 360bbb-3(b)(1), unless the authorization is terminated or revoked.  Performed at [facility], [address] [place], [place] [address]   Body fluid culture w Gram Stain     Status: None   Collection Time: 03/06/21  8:40 AM   Specimen: PATH Cytology Pleural fluid  Result Value Ref Range Status   Specimen Description   Final    PLEURAL Performed at [facility], [address] [place], [place] [address]    Special Requests   Final    NONE Performed at [facility], [address] [place], [place] [address]    Gram Stain   Final    RARE WBC PRESENT, PREDOMINANTLY MONONUCLEAR NO ORGANISMS SEEN    Culture   Final    NO GROWTH 3 DAYS Performed at [facility], [place] [address] [place], [place] [address]    Report Status 03/09/2021 FINAL  Final  MRSA Next Gen by PCR, Nasal     Status: None   Collection Time: 03/07/21  5:31 AM   Specimen: Nasal Mucosa; Nasal Swab  Result Value Ref Range Status   MRSA by PCR Next Gen NOT DETECTED NOT DETECTED Final    Comment: (NOTE) The GeneXpert MRSA Assay (FDA approved for NASAL specimens only), is one  component of a comprehensive MRSA colonization surveillance program. It is not intended to diagnose MRSA infection nor to guide or monitor treatment for MRSA infections. Test performance is not FDA approved in patients less than 63 years old. Performed at [facility], [address] [place], [place] [address]   MRSA Next Gen by PCR, Nasal     Status: None   Collection Time:  03/11/21  5:09 AM   Specimen: Nasal Mucosa; Nasal Swab  Result Value Ref Range Status   MRSA by PCR Next Gen NOT DETECTED NOT DETECTED Final    Comment: (NOTE) The GeneXpert MRSA Assay (FDA approved for NASAL specimens only), is one component of a comprehensive MRSA colonization surveillance program. It is not intended to diagnose MRSA infection nor to guide or monitor treatment for MRSA infections. Test performance is not FDA approved in patients less than 98 years old. Performed at [facility], [place] [address], [place], [place] [address]      Labs:  CBC: Recent Labs  Lab 03/11/21 [phone] 03/13/21 0945 03/13/21 1838 03/14/21 0525 03/15/21 0437  WBC 8.4 8.7 8.4 9.2 8.1  NEUTROABS  --  6.3  --  6.0 4.7  HGB 7.0* 7.3* 9.1* 8.5* 8.1*  HCT 22.2* 22.7* 29.0* 26.5* 25.4*  MCV 91.7 92.3 93.5 94.3 94.4  PLT 217 261 360 318 309   BMP &GFR Recent Labs  Lab 03/10/21 1541 03/11/21 0647 03/13/21 0945 03/13/21 1838 03/14/21 0525 03/15/21 0437  NA 133* 137 136 134* 136 133*  K 5.3* 5.1 3.8 3.5 3.6 3.8  CL 91* 96* 85* 91* 92* 89*  CO2 31 27 34* 31 30 30   GLUCOSE 97 109* 161* 154* 93 93  BUN 49* 26* 42* 16 20 29*  CREATININE 15.69* 10.63* 15.82* 8.74* 10.50* 13.28*  CALCIUM 8.2* 8.8* 7.8* 8.9 9.0 8.6*  PHOS 8.0*  --   --  4.0  --  7.2*   Estimated Creatinine Clearance: 7.9 mL/min (A) (by C-G formula based on SCr of 13.28 mg/dL (H)). Liver & Pancreas: Recent Labs  Lab 03/10/21 1541 03/13/21 1838 03/15/21 0437  ALBUMIN 3.1* 3.4* 2.9*   No results for input(s): LIPASE, AMYLASE in the  last 168 hours. No results for input(s): AMMONIA in the last 168 hours. Diabetic: No results for input(s): HGBA1C in the last 72 hours. No results for input(s): GLUCAP in the last 168 hours. Cardiac Enzymes: No results for input(s): CKTOTAL, CKMB, CKMBINDEX, TROPONINI in the last 168 hours. No results for input(s): PROBNP in the last 8760 hours. Coagulation Profile: No results for input(s): INR, PROTIME in the last 168 hours. Thyroid Function Tests: No results for input(s): TSH, T4TOTAL, FREET4, T3FREE, THYROIDAB in the last 72 hours. Lipid Profile: No results for input(s): CHOL, HDL, LDLCALC, TRIG, CHOLHDL, LDLDIRECT in the last 72 hours. Anemia Panel: No results for input(s): VITAMINB12, FOLATE, FERRITIN, TIBC, IRON, RETICCTPCT in the last 72 hours. Urine analysis: No results found for: COLORURINE, APPEARANCEUR, LABSPEC, PHURINE, GLUCOSEU, HGBUR, BILIRUBINUR, KETONESUR, PROTEINUR, UROBILINOGEN, NITRITE, LEUKOCYTESUR Sepsis Labs: Invalid input(s): PROCALCITONIN, LACTICIDVEN   Time coordinating discharge: 50 minutes  SIGNED:  Mercy Riding, MD  [facility] 03/15/2021, 1:39 PM

## 2021-03-15 NOTE — Progress Notes (Addendum)
Pt receives out-pt HD at [facility] on MWF. Pt arrives at 5:30 for 5:45 chair time. Will assist as needed.  Melven Sartorius Renal Navigator [phone]   Addendum at 3:12 pm: D/C order noted. [place] and spoke to Randall, Therapist, sports regarding pt's d/c today. Clinic aware pt will resume on Friday. Will fax d/c summary to clinic once completed for continuation of care.

## 2021-03-15 NOTE — Procedures (Signed)
I was present at this dialysis session. I have reviewed the session itself and made appropriate changes.   Vital signs in last 24 hours:  Temp:  [97.9 F (36.6 C)-98.8 F (37.1 C)] 98.1 F (36.7 C) (12/21 0743) Pulse Rate:  [109-115] 115 (12/21 0743) Resp:  [6-31] 21 (12/21 0743) BP: (127-166)/(46-88) 166/88 (12/21 0743) SpO2:  [92 %-99 %] 94 % (12/21 0600) Weight:  [84.8 kg-84.9 kg] 84.9 kg (12/21 0743) Weight change: -2.2 kg Filed Weights   03/14/21 0500 03/15/21 0500 03/15/21 0743  Weight: 85.4 kg 84.8 kg 84.9 kg    Recent Labs  Lab 03/15/21 0437  NA 133*  K 3.8  CL 89*  CO2 30  GLUCOSE 93  BUN 29*  CREATININE 13.28*  CALCIUM 8.6*  PHOS 7.2*    Recent Labs  Lab 03/13/21 0945 03/13/21 1838 03/14/21 0525 03/15/21 0437  WBC 8.7 8.4 9.2 8.1  NEUTROABS 6.3  --  6.0 4.7  HGB 7.3* 9.1* 8.5* 8.1*  HCT 22.7* 29.0* 26.5* 25.4*  MCV 92.3 93.5 94.3 94.4  PLT 261 360 318 309    Scheduled Meds:  amLODipine  10 mg Oral Daily   carvedilol  25 mg Oral BID WC   Chlorhexidine Gluconate Cloth  6 each Topical Q0600   darbepoetin (ARANESP) injection - DIALYSIS  100 mcg Intravenous Q Mon-HD   heparin  5,000 Units Subcutaneous Q8H   hydrALAZINE  100 mg Oral Q8H   mouth rinse  15 mL Mouth Rinse BID   minoxidil  5 mg Oral Daily   pantoprazole (PROTONIX) IV  40 mg Intravenous Q12H   Continuous Infusions:  sodium chloride     sodium chloride     PRN Meds:.sodium chloride, sodium chloride, acetaminophen, alteplase, dicyclomine, docusate sodium, heparin, heparin, labetalol, lidocaine (PF), lidocaine-prilocaine, metoCLOPramide (REGLAN) injection, ondansetron (ZOFRAN) IV, oxyCODONE, pentafluoroprop-tetrafluoroeth, polyethylene glycol   Donetta Potts,  MD 03/15/2021, 8:31 AM

## 2021-03-15 NOTE — Progress Notes (Signed)
NAME:  Travis Long, MRN:  [id number], DOB:  03/19/1986, LOS: 4 ADMISSION DATE:  03/11/2021, CONSULTATION DATE:  12/17 REFERRING MD:  Dr. Arbutus Ped, CHIEF COMPLAINT:  Loculated pleural effusion   History of Present Illness:  Patient is a 35 yo M w/ pertinent PMH of HTN, ESRD on HD MWF present to [facility] on 12/12 with increasing SOB.  Patient has not been adherent with BP meds and missed 1 session of dialysis last week.  Patient has had history of pleural effusions in 01/2020 and 11/2020 thoracentesis showed exudative process.  On 12/12 patient develops progressive SOB and was admitted to [facility].  CXR showed bilateral pleural effusions L >R and questionable pneumonia.  Patient mildly hypoxic on 2 L [place].  Patient very hypertensive.  Thoracentesis performed 12/12 and pleural studies indicate transudative process.  On 12/14 left sided pigtail chest tube placed with 850 cc output in patient was given tPA through tube.  On 12/15, chest tube halfway out and had IR replaced chest tube.  Patient's pleural effusion still remains loculated and not responding to tPA.  Patient admits being transferred to [facility] from [facility] for CT surgery evaluation and possible VATS.  On 12/16 patient is having some delirium/agitation and pulling at lines.  Was started on Precedex and given Ativan.  PCCM consulted for ICU admission  Pertinent  Medical History   Past Medical History:  Diagnosis Date   ESRD on hemodialysis ([place])    Hypertension      Significant Hospital Events: Including procedures, antibiotic start and stop dates in addition to other pertinent events   12/12: Admitted to [facility] for pleural effusion 12/17: transferred to [facility] from [facility] for loculated Left pleural effusion and VATS evaluation from CT surgery 12/18: Patient was seen by CT surgery, recommend no surgical intervention, continue chest tube drainage  Interim History / Subjective:  Transitioned to waterseal with little output, no significant effusion  reaccumulation, questionable change in basilar component of pneumo and no increased dyspnea or chest pain.  Objective   Blood pressure (!) 160/79, pulse (!) 113, temperature 99.2 F (37.3 C), temperature source Oral, resp. rate 20, weight 84.1 kg, SpO2 95 %.        Intake/Output Summary (Last 24 hours) at 03/15/2021 1429 Last data filed at 03/15/2021 1136 Gross per 24 hour  Intake 850 ml  Output 2720 ml  Net -1870 ml   Filed Weights   03/15/21 0500 03/15/21 0743 03/15/21 1136  Weight: 84.8 kg 84.9 kg 84.1 kg    Examination: General appearance: 35 y.o., male, NAD, conversant  Eyes: tracking appropriately HENT: NCAT; MMM Neck: Trachea midline; no lymphadenopathy, no JVD Lungs: diminished on left, no crackles, no wheeze, with normal respiratory effort CV: RRR, no murmur  Abdomen: Soft, non-tender; non-distended, BS present  Extremities: No peripheral edema, warm Skin: Normal turgor and texture; no rash Neuro: Alert and oriented to person and place, no focal deficit   Chest tube with 20cc output overnight to waterseal, +tidaling   CXR with ?increased size of basilar component of pneumo but no significant reaccumulation of effusion  OSH records reviewed:  PERTINENT ESRD WORKUP: -Renal arterial Doppler negative for stenosis bilaterally -Kidney ultrasound showed echogenic kidneys consistent with renal disease -Complements within normal limits -Anti-GBM, HIV, hepatitis C, hepatitis B, RPR, c-ANCA and MPO negative -No M spike -Urine protein/creatinine: 3.3   Resolved Hospital Problem list     Assessment & Plan:  Acute respiratory failure with hypoxia Recurrent exudative bilateral pleural effusions, L>R Trapped lung/non-expandable  lung Etiology of exudative effusions is unclear but would favor multifactorial largely from uremia driving exudative component, volume overload in setting HFpEF, ESRD worsening fluid burden. Prior workup of ESRD included broad autoimmune testing  as above  - ANA pending - Nephrology challenging dry weight - Chest tube removed - Discussed case with TCTS - feel likelihood of malignancy is low and yield of pleural biopsy for other etiologies low, would consider decortication if effusion recurs and symptomatic  - Given size of residual PTX ex vacuo, feel medical pleurodesis unlikely to be successful  - If later not felt to be VATS decortication candidate, given symptomatic improvement from thora but with non-expandable lung, could consider pleurX placement if need for thoracentesis accelerates but this comes with risk of infection and malnourishment - I have requested clinic follow up   Tobacco dependence Continue nicotine patch to prevent nicotine withdrawal Smoking cessation counseling once stable   Best Practice (right click and "Reselect all SmartList Selections" daily)   Per [facility] See Amion for pager If no response to pager, please call [phone] until 7pm After 7pm, Please call E-link [phone]

## 2021-03-16 ENCOUNTER — Telehealth: Payer: Self-pay | Admitting: Student

## 2021-03-16 LAB — ANA W/REFLEX IF POSITIVE: Anti Nuclear Antibody (ANA): NEGATIVE

## 2021-03-16 NOTE — Telephone Encounter (Signed)
Can we set up follow up appointment with me any time within the next 2-4 weeks?

## 2021-03-16 NOTE — Progress Notes (Signed)
Late Entry Note:  D/C summary faxed to [facility] this am for continuation of care.  Melven Sartorius Renal Navigator [phone]

## 2021-03-17 NOTE — Telephone Encounter (Signed)
I attempted to make a phone call and it rang a couple of times and then left a busy signal. If the patient calls back he need a consult with Dr. Verlee Monte to establish in the office in about 4 weeks.

## 2021-03-21 ENCOUNTER — Encounter: Payer: Self-pay | Admitting: *Deleted

## 2021-03-21 NOTE — Telephone Encounter (Signed)
Called pt and there was no answer and no option to leave msg. Letter has been mailed letting him know he needs to call back and make appt with Dr Verlee Monte.

## 2021-03-22 ENCOUNTER — Telehealth (INDEPENDENT_AMBULATORY_CARE_PROVIDER_SITE_OTHER): Payer: Self-pay

## 2021-03-22 NOTE — Telephone Encounter (Signed)
Crystal called from [employer] regarding the patient. I returned the call and spoke with the front desk person and was asked about when his access could be used. I explained that usually a patient has an appt in our office to be seen and then they would be evaluated to use the access. The patient does not have an upcoming appt in our office and that was made known as well.

## 2021-03-26 DIAGNOSIS — R451 Restlessness and agitation: Secondary | ICD-10-CM

## 2021-03-26 HISTORY — DX: Restlessness and agitation: R45.1

## 2021-04-17 ENCOUNTER — Emergency Department: Payer: Medicare Other

## 2021-04-17 ENCOUNTER — Other Ambulatory Visit: Payer: Self-pay

## 2021-04-17 ENCOUNTER — Inpatient Hospital Stay
Admission: EM | Admit: 2021-04-17 | Discharge: 2021-04-27 | DRG: 673 | Disposition: A | Discharge status: Home / Self Care | Payer: Medicare Other | Attending: Internal Medicine | Admitting: Internal Medicine

## 2021-04-17 DIAGNOSIS — J90 Pleural effusion, not elsewhere classified: Secondary | ICD-10-CM | POA: Diagnosis present

## 2021-04-17 DIAGNOSIS — E871 Hypo-osmolality and hyponatremia: Secondary | ICD-10-CM | POA: Diagnosis not present

## 2021-04-17 DIAGNOSIS — I160 Hypertensive urgency: Secondary | ICD-10-CM | POA: Diagnosis present

## 2021-04-17 DIAGNOSIS — Z20822 Contact with and (suspected) exposure to covid-19: Secondary | ICD-10-CM | POA: Diagnosis present

## 2021-04-17 DIAGNOSIS — T8241XA Breakdown (mechanical) of vascular dialysis catheter, initial encounter: Secondary | ICD-10-CM | POA: Diagnosis not present

## 2021-04-17 DIAGNOSIS — J810 Acute pulmonary edema: Secondary | ICD-10-CM

## 2021-04-17 DIAGNOSIS — J9621 Acute and chronic respiratory failure with hypoxia: Secondary | ICD-10-CM | POA: Diagnosis not present

## 2021-04-17 DIAGNOSIS — N186 End stage renal disease: Secondary | ICD-10-CM | POA: Diagnosis present

## 2021-04-17 DIAGNOSIS — Z79899 Other long term (current) drug therapy: Secondary | ICD-10-CM

## 2021-04-17 DIAGNOSIS — J948 Other specified pleural conditions: Secondary | ICD-10-CM | POA: Diagnosis present

## 2021-04-17 DIAGNOSIS — N2581 Secondary hyperparathyroidism of renal origin: Secondary | ICD-10-CM | POA: Diagnosis present

## 2021-04-17 DIAGNOSIS — D72829 Elevated white blood cell count, unspecified: Secondary | ICD-10-CM | POA: Diagnosis not present

## 2021-04-17 DIAGNOSIS — R112 Nausea with vomiting, unspecified: Secondary | ICD-10-CM | POA: Diagnosis not present

## 2021-04-17 DIAGNOSIS — Z87891 Personal history of nicotine dependence: Secondary | ICD-10-CM

## 2021-04-17 DIAGNOSIS — I5032 Chronic diastolic (congestive) heart failure: Secondary | ICD-10-CM | POA: Diagnosis present

## 2021-04-17 DIAGNOSIS — Z992 Dependence on renal dialysis: Secondary | ICD-10-CM

## 2021-04-17 DIAGNOSIS — J9811 Atelectasis: Secondary | ICD-10-CM | POA: Diagnosis present

## 2021-04-17 DIAGNOSIS — R0602 Shortness of breath: Secondary | ICD-10-CM

## 2021-04-17 DIAGNOSIS — I132 Hypertensive heart and chronic kidney disease with heart failure and with stage 5 chronic kidney disease, or end stage renal disease: Secondary | ICD-10-CM | POA: Diagnosis present

## 2021-04-17 DIAGNOSIS — E8770 Fluid overload, unspecified: Secondary | ICD-10-CM | POA: Diagnosis not present

## 2021-04-17 DIAGNOSIS — I959 Hypotension, unspecified: Secondary | ICD-10-CM | POA: Diagnosis not present

## 2021-04-17 DIAGNOSIS — Z6826 Body mass index (BMI) 26.0-26.9, adult: Secondary | ICD-10-CM

## 2021-04-17 DIAGNOSIS — Y712 Prosthetic and other implants, materials and accessory cardiovascular devices associated with adverse incidents: Secondary | ICD-10-CM | POA: Diagnosis present

## 2021-04-17 DIAGNOSIS — F329 Major depressive disorder, single episode, unspecified: Secondary | ICD-10-CM | POA: Diagnosis present

## 2021-04-17 DIAGNOSIS — J479 Bronchiectasis, uncomplicated: Secondary | ICD-10-CM | POA: Diagnosis present

## 2021-04-17 DIAGNOSIS — E663 Overweight: Secondary | ICD-10-CM | POA: Diagnosis present

## 2021-04-17 DIAGNOSIS — D631 Anemia in chronic kidney disease: Secondary | ICD-10-CM | POA: Diagnosis present

## 2021-04-17 DIAGNOSIS — Z8249 Family history of ischemic heart disease and other diseases of the circulatory system: Secondary | ICD-10-CM

## 2021-04-17 DIAGNOSIS — I10 Essential (primary) hypertension: Secondary | ICD-10-CM | POA: Diagnosis present

## 2021-04-17 LAB — BASIC METABOLIC PANEL
Anion gap: 13 (ref 5–15)
BUN: 43 mg/dL — ABNORMAL HIGH (ref 6–20)
CO2: 25 mmol/L (ref 22–32)
Calcium: 8.9 mg/dL (ref 8.9–10.3)
Chloride: 95 mmol/L — ABNORMAL LOW (ref 98–111)
Creatinine, Ser: 10.12 mg/dL — ABNORMAL HIGH (ref 0.61–1.24)
GFR, Estimated: 6 mL/min — ABNORMAL LOW (ref >60)
Glucose, Bld: 81 mg/dL (ref 70–99)
Potassium: 4 mmol/L (ref 3.5–5.1)
Sodium: 133 mmol/L — ABNORMAL LOW (ref 135–145)

## 2021-04-17 LAB — CBC
HCT: 25.1 % — ABNORMAL LOW (ref 39.0–52.0)
Hemoglobin: 8.3 g/dL — ABNORMAL LOW (ref 13.0–17.0)
MCH: 31.2 pg (ref 26.0–34.0)
MCHC: 33.1 g/dL (ref 30.0–36.0)
MCV: 94.4 fL (ref 80.0–100.0)
Platelets: 256 10*3/uL (ref 150–400)
RBC: 2.66 MIL/uL — ABNORMAL LOW (ref 4.22–5.81)
RDW: 14.9 % (ref 11.5–15.5)
WBC: 7.2 10*3/uL (ref 4.0–10.5)
nRBC: 0 % (ref 0.0–0.2)

## 2021-04-17 LAB — RESP PANEL BY RT-PCR (FLU A&B, COVID) ARPGX2
Influenza A by PCR: NEGATIVE
Influenza B by PCR: NEGATIVE
SARS Coronavirus 2 by RT PCR: NEGATIVE

## 2021-04-17 LAB — HEPATITIS B SURFACE ANTIGEN: Hepatitis B Surface Ag: NONREACTIVE

## 2021-04-17 MED ORDER — HEPARIN SODIUM (PORCINE) 5000 UNIT/ML IJ SOLN
5000.0000 [IU] | Freq: Three times a day (TID) | INTRAMUSCULAR | Status: DC
  Administered 2021-04-18: 23:00:00 5000 [IU] via SUBCUTANEOUS
  Filled 2021-04-17 (×3): qty 1

## 2021-04-17 MED ORDER — PENTAFLUOROPROP-TETRAFLUOROETH EX AERO
1.0000 "application " | INHALATION_SPRAY | CUTANEOUS | Status: DC | PRN
  Filled 2021-04-17: qty 30

## 2021-04-17 MED ORDER — SODIUM CHLORIDE 0.9 % IV SOLN: 100.0000 mL | INTRAVENOUS | Status: DC | PRN

## 2021-04-17 MED ORDER — HYDRALAZINE HCL 50 MG PO TABS
100.0000 mg | ORAL_TABLET | Freq: Three times a day (TID) | ORAL | Status: DC
  Administered 2021-04-17 – 2021-04-23 (×11): 100 mg via ORAL
  Filled 2021-04-17 (×11): qty 2

## 2021-04-17 MED ORDER — HEPARIN SODIUM (PORCINE) 1000 UNIT/ML DIALYSIS
1000.0000 [IU] | INTRAMUSCULAR | Status: DC | PRN
  Administered 2021-04-17: 3200 [IU] via INTRAVENOUS_CENTRAL
  Administered 2021-04-19: 10000 [IU] via INTRAVENOUS_CENTRAL
  Filled 2021-04-17 (×4): qty 1

## 2021-04-17 MED ORDER — PANTOPRAZOLE SODIUM 40 MG PO TBEC
40.0000 mg | DELAYED_RELEASE_TABLET | Freq: Every day | ORAL | Status: DC
  Administered 2021-04-26: 10:00:00 40 mg via ORAL
  Filled 2021-04-17 (×8): qty 1

## 2021-04-17 MED ORDER — CHLORHEXIDINE GLUCONATE CLOTH 2 % EX PADS
6.0000 | MEDICATED_PAD | Freq: Every day | CUTANEOUS | Status: DC
  Administered 2021-04-19: 08:00:00 6 via TOPICAL
  Filled 2021-04-17: qty 6

## 2021-04-17 MED ORDER — SODIUM CHLORIDE 0.9% FLUSH
3.0000 mL | Freq: Two times a day (BID) | INTRAVENOUS | Status: DC
  Administered 2021-04-18 – 2021-04-27 (×12): 3 mL via INTRAVENOUS

## 2021-04-17 MED ORDER — CARVEDILOL 25 MG PO TABS
25.0000 mg | ORAL_TABLET | Freq: Two times a day (BID) | ORAL | Status: DC
  Administered 2021-04-18 – 2021-04-24 (×11): 25 mg via ORAL
  Filled 2021-04-17 (×12): qty 1

## 2021-04-17 MED ORDER — HEPARIN SODIUM (PORCINE) 1000 UNIT/ML IJ SOLN
INTRAMUSCULAR | Status: AC
  Filled 2021-04-17: qty 10

## 2021-04-17 MED ORDER — LIDOCAINE-PRILOCAINE 2.5-2.5 % EX CREA
1.0000 "application " | TOPICAL_CREAM | CUTANEOUS | Status: DC | PRN
  Filled 2021-04-17: qty 5

## 2021-04-17 MED ORDER — SODIUM CHLORIDE 0.9% FLUSH: 3.0000 mL | INTRAVENOUS | Status: DC | PRN

## 2021-04-17 MED ORDER — ALTEPLASE 2 MG IJ SOLR
2.0000 mg | Freq: Once | INTRAMUSCULAR | Status: DC | PRN
  Filled 2021-04-17: qty 2

## 2021-04-17 MED ORDER — AMLODIPINE BESYLATE 10 MG PO TABS
10.0000 mg | ORAL_TABLET | Freq: Every day | ORAL | Status: DC
  Administered 2021-04-18 – 2021-04-23 (×6): 10 mg via ORAL
  Filled 2021-04-17 (×7): qty 1

## 2021-04-17 MED ORDER — SODIUM CHLORIDE 0.9 % IV SOLN: 250.0000 mL | INTRAVENOUS | Status: DC | PRN

## 2021-04-17 MED ORDER — LIDOCAINE HCL (PF) 1 % IJ SOLN
5.0000 mL | INTRAMUSCULAR | Status: DC | PRN
  Filled 2021-04-17: qty 5

## 2021-04-17 MED ORDER — MINOXIDIL 2.5 MG PO TABS
5.0000 mg | ORAL_TABLET | Freq: Every day | ORAL | Status: DC
  Administered 2021-04-18 – 2021-04-24 (×5): 5 mg via ORAL
  Filled 2021-04-17 (×8): qty 2

## 2021-04-17 NOTE — Progress Notes (Signed)
CT chest shows high-grade consolidation of left lower lobe either pneumonia versus atelectasis.  Hydropneumothorax appears increased from prior CT scan on 03/06/2021.  he actually was seen by [name] surgery and they opted for conservative management, I think he will need second opinion by CT surgery at [facility] or [facility].  nothing PCCM can offer this patient at this time

## 2021-04-17 NOTE — H&P (Addendum)
History and Physical    Travis Long ZHG:[id number] DOB: 09-Dec-1985 DOA: 04/17/2021  PCP: Pcp, No  Patient coming from: hemodialysis center by way of home   Chief Complaint: dyspnea  HPI: Travis Long is a 36 y.o. male with medical history significant for esrd on mwf hemodialysis, recent hospitalization for symptomatic pleural effusion, who presents with the above.  Reports compliance w/ 3x weekly hemodialysis. Says last Wednesday there were problems with his diaysis catheter that persisted on Friday and again today resulting in incomplete hemodialysis sessions. Reports worsening dyspnea since that initial incomplete session on Wednesday. No swelling. No cough or fever or chest pain or n/v/d, no abdominal pain.   ED Course:   Labs, CT chest, discussion w/ nephrology, plan for hemodialysis today w/ ultrafiltration  Review of Systems: As per HPI otherwise 10 point review of systems negative.    Past Medical History:  Diagnosis Date   ESRD on hemodialysis [facility])    Hypertension     Past Surgical History:  Procedure Laterality Date   A/V FISTULAGRAM Left 03/09/2021   Procedure: A/V Fistulagram;  Surgeon: Algernon Huxley, MD;  Location: [place] [facility];  Service: Cardiovascular;  Laterality: Left;   AV FISTULA PLACEMENT Left    IR CATHETER TUBE CHANGE  03/09/2021     reports that he has quit smoking. His smoking use included cigarettes. He has never used smokeless tobacco. He reports that he does not currently use alcohol. He reports that he does not use drugs.  No Known Allergies  Family History  Problem Relation Age of Onset   Hypertension Mother    Diabetes Mellitus II Mother     Prior to Admission medications   Medication Sig Start Date End Date Taking? Authorizing Provider  amLODipine (NORVASC) 10 MG tablet Take 1 tablet (10 mg total) by mouth daily. 12/02/20   Sharen Hones, MD  calcium acetate (PHOSLO) 667 MG capsule Take 2 capsules (1,334 mg total) by mouth 3  (three) times daily with meals. 03/10/21   Ezekiel Slocumb, DO  carvedilol (COREG) 25 MG tablet Take 1 tablet (25 mg total) by mouth 2 (two) times daily with a meal. 03/15/21   Mercy Riding, MD  dicyclomine (BENTYL) 10 MG capsule Take 1 capsule (10 mg total) by mouth every 6 (six) hours as needed (nausea, emesis). 03/15/21   Mercy Riding, MD  epoetin alfa (EPOGEN) 10000 UNIT/ML injection Inject 0.4 mLs (4,000 Units total) into the vein every Monday, Wednesday, and Friday with hemodialysis. 03/13/21   Nicole Kindred A, DO  hydrALAZINE (APRESOLINE) 100 MG tablet Take 1 tablet (100 mg total) by mouth every 8 (eight) hours. 03/15/21   Mercy Riding, MD  minoxidil (LONITEN) 2.5 MG tablet Take 2 tablets (5 mg total) by mouth daily. 12/03/20   Sharen Hones, MD  pantoprazole (PROTONIX) 40 MG tablet Take 1 tablet (40 mg total) by mouth daily. 03/15/21   Mercy Riding, MD    Physical Exam: Vitals:   04/17/21 1023 04/17/21 1024 04/17/21 1300 04/17/21 1315  BP: (!) 194/130     Pulse: 88  90 88  Resp: 20     Temp: 99 F (37.2 C)     TempSrc: Oral     SpO2: 91%  100% 100%  Weight:  79.4 kg    Height:  5\' 9"  (1.753 m)      Constitutional: No acute distress Head: Atraumatic Eyes: Conjunctiva clear ENM: Moist mucous membranes. Normal dentition.  Neck: Supple Respiratory:  rales b/l bases Cardiovascular: Regular rate and rhythm. No murmurs/rubs/gallops. Abdomen: Non-tender, non-distended. No masses. No rebound or guarding. Positive bowel sounds. Musculoskeletal: No joint deformity upper and lower extremities. Normal ROM, no contractures. Normal muscle tone.  Skin: No rashes, lesions, or ulcers.  Extremities: No peripheral edema. Palpable peripheral pulses. Neurologic: Alert, moving all 4 extremities. Psychiatric: Normal insight and judgement.   Labs on Admission: I have personally reviewed following labs and imaging studies  CBC: Recent Labs  Lab 04/17/21 1026  WBC 7.2  HGB 8.3*  HCT  25.1*  MCV 94.4  PLT 852   Basic Metabolic Panel: Recent Labs  Lab 04/17/21 1026  NA 133*  K 4.0  CL 95*  CO2 25  GLUCOSE 81  BUN 43*  CREATININE 10.12*  CALCIUM 8.9   GFR: Estimated Creatinine Clearance: 10.4 mL/min (A) (by C-G formula based on SCr of 10.12 mg/dL (H)). Liver Function Tests: No results for input(s): AST, ALT, ALKPHOS, BILITOT, PROT, ALBUMIN in the last 168 hours. No results for input(s): LIPASE, AMYLASE in the last 168 hours. No results for input(s): AMMONIA in the last 168 hours. Coagulation Profile: No results for input(s): INR, PROTIME in the last 168 hours. Cardiac Enzymes: No results for input(s): CKTOTAL, CKMB, CKMBINDEX, TROPONINI in the last 168 hours. BNP (last 3 results) No results for input(s): PROBNP in the last 8760 hours. HbA1C: No results for input(s): HGBA1C in the last 72 hours. CBG: No results for input(s): GLUCAP in the last 168 hours. Lipid Profile: No results for input(s): CHOL, HDL, LDLCALC, TRIG, CHOLHDL, LDLDIRECT in the last 72 hours. Thyroid Function Tests: No results for input(s): TSH, T4TOTAL, FREET4, T3FREE, THYROIDAB in the last 72 hours. Anemia Panel: No results for input(s): VITAMINB12, FOLATE, FERRITIN, TIBC, IRON, RETICCTPCT in the last 72 hours. Urine analysis: No results found for: COLORURINE, APPEARANCEUR, LABSPEC, [place], GLUCOSEU, HGBUR, BILIRUBINUR, KETONESUR, PROTEINUR, UROBILINOGEN, NITRITE, LEUKOCYTESUR  Radiological Exams on Admission: DG Chest 2 View  Result Date: 04/17/2021 CLINICAL DATA:  Shortness of breath.  Dialysis. EXAM: CHEST - 2 VIEW COMPARISON:  March 15, 2021 FINDINGS: Dual lumen dialysis catheter with tip projecting over the superior cavoatrial junction. The heart size and mediastinal contours are obscured. Large right pleural effusion with atelectasis versus infiltrate involving the left lung. Two focal gas fluid levels in the left hemithorax 1 of the mid lung level the other at the apex may  reflect focal areas of aerated lung but abscess not excluded consider further evaluation with chest CT. Small right pleural effusion. Bilateral pulmonary edema. The visualized skeletal structures are unchanged. IMPRESSION: Large left pleural effusion with a combination of edema and atelectasis versus infiltrate in the left lung, additionally there are 2 focal gas fluid-levels in the left hemithorax 1 of the mid lung level the other in the apex which may reflect focal areas of aerated lung but abscess not excluded, consider further evaluation with chest CT. Small right pleural effusion with right lung atelectasis and edema. Electronically Signed   By: Dahlia Bailiff M.D.   On: 04/17/2021 11:19    EKG: Independently reviewed. Nsr, lvh  Assessment/Plan Principal Problem:   Volume overload Active Problems:   ESRD on hemodialysis (HCC)   Hypertension   Anemia in ESRD (end-stage renal disease) (HCC)   Pleural effusion    # ESRD on hemodialysis # Volume overload Here dyspneic but not hypoxic, likely 2/2 incomplete hemodialysis. Nephrology has been consulted, plan for attempt at ultrafiltration today with trouble shooting of dialysis catheter (has  right subclavian tunneled catheter). - monitor O2 - obs status for now, if dialysis successful today patient can possibly d/c tomorrow  # Pleural effusion Loculated, on recent admission. Treated with thoracentesis and then chest tube. Pulm consulted. Per their last progress note etiology of exudative effusion unclear but favor multifactorial (uremia, volume overload). Per that note case discussed w/ TCTS decision then made note to proceed w/ pleural biopsy or decortication but to consider such if recurs and is symptomatic. Patient does have outpatient f/u with [facility] pulmonology later this week. CXR shows persistent effusion. CT of chest shows persistent complicated effusion. I've discussed w/ Dr. Cyndia Bent of CT surg who says no surgical intervention advised  and hence no need for transfer.  - pulm to see  # Hypertensive urgency Systolic 458, asymptomatic, no lab abnormalities. Likely 2/2 several days insufficient dialysis - dialysis as above - continue home amlod, coreg, hydral, minoxidil  # Anemia of ESRD H 8.3 which is stable - monitor - epo per nephrology  DVT prophylaxis: heparin Code Status: full  Family Communication: none @ bedside  Consults called:  nephrology   Level of care: Med-Surg Status is: Observation  The patient remains OBS appropriate and will d/c before 2 midnights.       Desma Maxim MD [facility] Pager [phone]  If 7PM-7AM, please contact night-coverage [email or web address] Password [facility]  04/17/2021, 2:01 PM

## 2021-04-17 NOTE — Progress Notes (Signed)
Hd terminated early with 30 minutes hd treatment time remaining d/t arterial pressure  that keeps on bottoming down and keeps on alarming. BFR has to be lowered to 250 but still alarming. Pt requested to be off the machine. Dr Candiss Norse was notified and he agreed. Total UF net removed was 2554ml. Pt was asymptomatic.

## 2021-04-17 NOTE — ED Triage Notes (Signed)
See first nurse note, pt reports has had increased shob since Wednesday. Pt in NAD, playing on cellphone.  Speaking in complete sentences, RR even and unlabored.  Denies cp.  Did not finish dialysis today.

## 2021-04-17 NOTE — Progress Notes (Signed)
[facility]  ROUNDING NOTE   Subjective:   Travis Long is a 36 year old African-American male with past medical concerns including hypertension and end-stage renal disease on dialysis.  Patient presents to the emergency department from his outpatient dialysis center with complaints of shortness of breath.  Patient will be admitted under observation for Volume overload [E87.70]  Patient is known to our clinic and receives outpatient dialysis treatments at [facility], supervised by Dr. Holley Raring.  Patient receives dialysis on a MWF schedule.  Patient reported to dialysis today 8 kg above target weight.  4 L removed during treatment.  Patient states she progressively became short of breath during last hour of treatment.  According to outpatient clinic, patient's catheter is positional and would not run efficiently during treatment.  Patient sent to emergency department due to progressive shortness of breath.  Denies associating symptoms of nausea, vomiting, diarrhea, chest pain, cough.  Outpatient clinic also voiced concerns of patency of permacath.  States access very positional resulting in decreased blood flow rates.  Labs on arrival include sodium 133, creatinine 10.12 with GFR 6, and hemoglobin 8.3.  Respiratory panel negative for flu and COVID-19.  Chest x-ray positive for large left pleural effusion with edema and atelectasis versus infiltrate.  CT chest shows high-grade consolidation of left lower lobe either pneumonia versus atelectasis.  Hydropneumothorax appears increased from prior CT scan on 03/06/2021.  We have been consulted to assess HD access and dialyze patient during this admission  Objective:  Vital signs in last 24 hours:  Temp:  [99 F (37.2 C)-99.2 F (37.3 C)] 99.2 F (37.3 C) (01/23 1441) Pulse Rate:  [88-90] 88 (01/23 1315) Resp:  [20] 20 (01/23 1023) BP: (194)/(130) 194/130 (01/23 1023) SpO2:  [91 %-100 %] 100 % (01/23 1315) Weight:  [79.4 kg] 79.4  kg (01/23 1024)  Weight change:  Filed Weights   04/17/21 1024  Weight: 79.4 kg    Intake/Output: No intake/output data recorded.   Intake/Output this shift:  No intake/output data recorded.  Physical Exam: General: NAD, sitting up on stretcher  Head: Normocephalic, atraumatic. Moist oral mucosal membranes  Eyes: Anicteric  Lungs:  Clear to auscultation, normal effort, 2 L [place]  Heart: Regular rate and rhythm  Abdomen:  Soft, nontender  Extremities:  No peripheral edema.  Neurologic: Nonfocal, moving all four extremities  Skin: No lesions  Access: Right IJ PermCath, Lt lower AVF    Basic Metabolic Panel: Recent Labs  Lab 04/17/21 1026  NA 133*  K 4.0  CL 95*  CO2 25  GLUCOSE 81  BUN 43*  CREATININE 10.12*  CALCIUM 8.9    Liver Function Tests: No results for input(s): AST, ALT, ALKPHOS, BILITOT, PROT, ALBUMIN in the last 168 hours. No results for input(s): LIPASE, AMYLASE in the last 168 hours. No results for input(s): AMMONIA in the last 168 hours.  CBC: Recent Labs  Lab 04/17/21 1026  WBC 7.2  HGB 8.3*  HCT 25.1*  MCV 94.4  PLT 256    Cardiac Enzymes: No results for input(s): CKTOTAL, CKMB, CKMBINDEX, TROPONINI in the last 168 hours.  BNP: Invalid input(s): POCBNP  CBG: No results for input(s): GLUCAP in the last 168 hours.  Microbiology: Results for orders placed or performed during the hospital encounter of 04/17/21  Resp Panel by RT-PCR (Flu A&B, Covid) Nasopharyngeal Swab     Status: None   Collection Time: 04/17/21  1:00 PM   Specimen: Nasopharyngeal Swab; Nasopharyngeal(NP) swabs in vial transport medium  Result Value Ref Range Status   SARS Coronavirus 2 by RT PCR NEGATIVE NEGATIVE Final    Comment: (NOTE) SARS-CoV-2 target nucleic acids are NOT DETECTED.  The SARS-CoV-2 RNA is generally detectable in upper respiratory specimens during the acute phase of infection. The lowest concentration of SARS-CoV-2 viral copies this assay can  detect is 138 copies/mL. A negative result does not preclude SARS-Cov-2 infection and should not be used as the sole basis for treatment or other patient management decisions. A negative result may occur with  improper specimen collection/handling, submission of specimen other than nasopharyngeal swab, presence of viral mutation(s) within the areas targeted by this assay, and inadequate number of viral copies(<138 copies/mL). A negative result must be combined with clinical observations, patient history, and epidemiological information. The expected result is Negative.  Fact Sheet for Patients:  EntrepreneurPulse.com.au  Fact Sheet for Healthcare Providers:  IncredibleEmployment.be  This test is no t yet approved or cleared by the [place] FDA and  has been authorized for detection and/or diagnosis of SARS-CoV-2 by FDA under an Emergency Use Authorization (EUA). This EUA will remain  in effect (meaning this test can be used) for the duration of the COVID-19 declaration under Section 564(b)(1) of the Act, 21 U.S.C.section 360bbb-3(b)(1), unless the authorization is terminated  or revoked sooner.       Influenza A by PCR NEGATIVE NEGATIVE Final   Influenza B by PCR NEGATIVE NEGATIVE Final    Comment: (NOTE) The Xpert Xpress SARS-CoV-2/FLU/RSV plus assay is intended as an aid in the diagnosis of influenza from Nasopharyngeal swab specimens and should not be used as a sole basis for treatment. Nasal washings and aspirates are unacceptable for Xpert Xpress SARS-CoV-2/FLU/RSV testing.  Fact Sheet for Patients: EntrepreneurPulse.com.au  Fact Sheet for Healthcare Providers: IncredibleEmployment.be  This test is not yet approved or cleared by the [place] FDA and has been authorized for detection and/or diagnosis of SARS-CoV-2 by FDA under an Emergency Use Authorization (EUA). This EUA will remain in  effect (meaning this test can be used) for the duration of the COVID-19 declaration under Section 564(b)(1) of the Act, 21 U.S.C. section 360bbb-3(b)(1), unless the authorization is terminated or revoked.  Performed at [facility], [address] [place], [place] [address]     Coagulation Studies: No results for input(s): LABPROT, INR in the last 72 hours.  Urinalysis: No results for input(s): COLORURINE, LABSPEC, PHURINE, GLUCOSEU, HGBUR, BILIRUBINUR, KETONESUR, PROTEINUR, UROBILINOGEN, NITRITE, LEUKOCYTESUR in the last 72 hours.  Invalid input(s): APPERANCEUR    Imaging: DG Chest 2 View  Result Date: 04/17/2021 CLINICAL DATA:  Shortness of breath.  Dialysis. EXAM: CHEST - 2 VIEW COMPARISON:  March 15, 2021 FINDINGS: Dual lumen dialysis catheter with tip projecting over the superior cavoatrial junction. The heart size and mediastinal contours are obscured. Large right pleural effusion with atelectasis versus infiltrate involving the left lung. Two focal gas fluid levels in the left hemithorax 1 of the mid lung level the other at the apex may reflect focal areas of aerated lung but abscess not excluded consider further evaluation with chest CT. Small right pleural effusion. Bilateral pulmonary edema. The visualized skeletal structures are unchanged. IMPRESSION: Large left pleural effusion with a combination of edema and atelectasis versus infiltrate in the left lung, additionally there are 2 focal gas fluid-levels in the left hemithorax 1 of the mid lung level the other in the apex which may reflect focal areas of aerated lung but abscess not excluded, consider further evaluation  with chest CT. Small right pleural effusion with right lung atelectasis and edema. Electronically Signed   By: Dahlia Bailiff M.D.   On: 04/17/2021 11:19   CT Chest Wo Contrast  Result Date: 04/17/2021 CLINICAL DATA:  Pneumonia. Complication suspected. Abnormal chest radiograph. After 3 hours of  dialysis treatment today common dialysis machine with stopping was told he needed dialysis access changed out. Hypertension. Patient has not felt well for 1 week. EXAM: CT CHEST WITHOUT CONTRAST TECHNIQUE: Multidetector CT imaging of the chest was performed following the standard protocol without IV contrast. RADIATION DOSE REDUCTION: This exam was performed according to the departmental dose-optimization program which includes automated exposure control, adjustment of the mA and/or kV according to patient size and/or use of iterative reconstruction technique. COMPARISON:  Chest radiographs 04/17/2021, AP chest 03/15/2021, 03/09/2021, 09/16/2020; CT chest 03/06/2021 FINDINGS: Right internal jugular dual-lumen central venous catheter with tips within the central superior vena cava. Cardiovascular: Heart size is again mildly enlarged. Trace pericardial fluid is slightly increased from prior. No thoracic aortic aneurysm. Mediastinum/Nodes: No pathologically enlarged axillary lymph nodes by CT criteria. There are again multiple mildly enlarged mediastinal lymph nodes. The largest is again seen within the inferior right paratracheal region measuring up to 1.3 cm in short axis, not significantly changed from prior when measured in a similar manner. Again these are likely reactive. No definite hilar lymphadenopathy is seen, within the limitations of lack of IV contrast.The thyroid gland is grossly unremarkable. The esophagus follows a normal course of normal caliber. Lungs/Pleura: There is again opacification of the distal segmental left lower lobe airways. There is again high-grade consolidation within the posterior left lower lobe. Moderate bilateral pleural effusions are mildly increased from prior. Interval increase in air component of left-sided hydropneumothorax. This includes an air component measuring up to approximately 2.8 cm in craniocaudal dimension on the current CT, increased from 2.1 cm on 03/06/2021 CT (as  measured on sagittal series 6, image 122). There is also interval increase in air within the more inferior posterolateral hydropneumothorax component on this supine CT with air newly seen within the non dependent regions of the left mid and lower lung (axial series 3, images 84 through 99). Previously a small amount of air was seen within the posteromedial left lower lobe pleural space, and this is still mild but slightly increased from prior (axial series 3 images 63 through 84). Overall there is interval increase in fluid and air components of this left-sided hydropneumothorax. There again mild right peripheral paraseptal cystic emphysematous changes. Upper Abdomen: There is partial visualization of the kidneys which demonstrate moderate to high-grade atrophy consistent with known chronic kidney disease. Moderate to high-grade bilateral gynecomastia is again noted. Musculoskeletal: There is diffuse increased bone density again seen, consistent with chronic renal failure. IMPRESSION:: IMPRESSION: 1. Compared to 03/06/2021, there is interval increase in the fluid and air components of a moderate left hydropneumothorax. Interval mild increase in moderate bilateral pleural effusions. There again appear to be multiple loculations within the hydropneumothorax, also increased from prior. 2. There is again high-grade consolidation of the posterior left lower lobe either pneumonia versus round atelectasis. 3. Note is made of prior 03/09/2021 basilar pleural pigtail catheter placement in the region of this hydropneumothorax on prior 03/09/2021 radiographs. Following removal of this pigtail catheter, the extrapleural air and fluid appears to have increased over time. Electronically Signed   By: Yvonne Kendall M.D.   On: 04/17/2021 14:17     Medications:     [START ON  04/18/2021] Chlorhexidine Gluconate Cloth  6 each Topical Q0600     Assessment/ Plan:  Mr. Tyjai Matuszak is a 36 y.o.  male past medical concerns  including hypertension and end-stage renal disease on dialysis.  Patient presents to the emergency department from his outpatient dialysis center with complaints of shortness of breath.  Patient will be admitted under observation for Volume overload [E87.70]  [facility]/MWF/right IJ PermCath  Acute respiratory distress with end-stage renal disease on dialysis Complained of shortness of breath during outpatient treatment.  4 L removed at clinic.  Outpatient clinic voiced concerns of PermCath deficiency.  We will dialyze patient today for additional fluid removal and to assess PermCath function.  If PermCath malfunction, will consult vascular to evaluate possible PermCath exchange.  UF goal 2.5 to 3 L as tolerated.  Next treatment scheduled for Wednesday.  2. Anemia of chronic kidney disease  Lab Results  Component Value Date   HGB 8.3 (L) 04/17/2021  Hemoglobin below target, will order EPO 10,000 units IV with dialysis  3. Secondary Hyperparathyroidism:   Lab Results  Component Value Date   CALCIUM 8.9 04/17/2021   PHOS 7.2 (H) 03/15/2021    Calcium remains at goal but phosphorus appears elevated.  Continue calcium acetate, 2 capsules with meals.  4.  Hypertension with chronic kidney disease.  Home regimen includes amlodipine, carvedilol, and hydralazine.  Blood pressure elevated at 184/93 at initiation of dialysis.   LOS: 0   1/23/20233:14 PM

## 2021-04-17 NOTE — ED Provider Notes (Signed)
[facility] Provider Note    Event Date/Time   First MD Initiated Contact with Patient 04/17/21 1234     (approximate)   History   Shortness of Breath and Hypertension   HPI  Travis Long is a 36 y.o. male with end-stage renal disease on dialysis presents to the ER for evaluation of worsening shortness of breath from dialysis center.  Reportedly is over 8 L up from his dry weight [place] off 4 L but he states that his catheter his been pulling slowly.  No reported chest pain.  Does feel short of breath compared to his baseline.     Physical Exam   Triage Vital Signs: ED Triage Vitals  Enc Vitals Group     BP 04/17/21 1023 (!) 194/130     Pulse Rate 04/17/21 1023 88     Resp 04/17/21 1023 20     Temp 04/17/21 1023 99 F (37.2 C)     Temp Source 04/17/21 1023 Oral     SpO2 04/17/21 1023 91 %     Weight 04/17/21 1024 175 lb (79.4 kg)     Height 04/17/21 1024 5\' 9"  (1.753 m)     Head Circumference --      Peak Flow --      Pain Score 04/17/21 1023 0     Pain Loc --      Pain Edu? --      Excl. in [place]? --     Most recent vital signs: Vitals:   04/17/21 1300 04/17/21 1315  BP:    Pulse: 90 88  Resp:    Temp:    SpO2: 100% 100%     Constitutional: Alert  Eyes: Conjunctivae are normal.  Head: Atraumatic. Nose: No congestion/rhinnorhea. Mouth/Throat: Mucous membranes are moist.   Neck: Painless ROM.  Cardiovascular:   Good peripheral circulation. Respiratory: tachypnea. Inspiratory crackles posteriorly Gastrointestinal: Soft and nontender.  Musculoskeletal:  no deformity Neurologic:  MAE spontaneously. No gross focal neurologic deficits are appreciated.  Skin:  Skin is warm, dry and intact. No rash noted. Psychiatric: Mood and affect are normal. Speech and behavior are normal.    ED Results / Procedures / Treatments   Labs (all labs ordered are listed, but only abnormal results are displayed) Labs Reviewed  CBC - Abnormal;  Notable for the following components:      Result Value   RBC 2.66 (*)    Hemoglobin 8.3 (*)    HCT 25.1 (*)    All other components within normal limits  BASIC METABOLIC PANEL - Abnormal; Notable for the following components:   Sodium 133 (*)    Chloride 95 (*)    BUN 43 (*)    Creatinine, Ser 10.12 (*)    GFR, Estimated 6 (*)    All other components within normal limits  RESP PANEL BY RT-PCR (FLU A&B, COVID) [id number]     EKG  ED ECG REPORT I, Merlyn Lot, the attending physician, personally viewed and interpreted this ECG.   Date: 04/17/2021  EKG Time: 10:32  Rate: 90  Rhythm: sinus  Axis: normal  Intervals:  normal qt  ST&T Change: nonspecific st abn    RADIOLOGY Please see ED Course for my review and interpretation.  I personally reviewed all radiographic images ordered to evaluate for the above acute complaints and reviewed radiology reports and findings.  These findings were personally discussed with the patient.  Please see medical record for radiology report.   PROCEDURES:  Critical Care performed: No  Procedures   MEDICATIONS ORDERED IN ED: Medications - No data to display   IMPRESSION / MDM / [place] / ED COURSE  I reviewed the triage vital signs and the nursing notes.                              Differential diagnosis includes, but is not limited to,  Asthma, copd, CHF, pna, ptx, malignancy, Pe, anemia  Patient with presentation as described above in the setting of end-stage renal disease on dialysis having persistent shortness of breath and evidence of volume overload.  This is despite receiving dialysis today chest x-ray by my review appears consistent with acute pulmonary edema.  Per radiology report possible underlying pneumonia or effusion for which CT imaging has been ordered to further evaluate.  No significant leukocytosis.  Does have evidence of chronic renal insufficiency but his potassium is normal.  I consulted with Dr.  Candiss Norse of nephrology who is evaluated patient and plans dialysis has recommended hospitalization for observation and reassessment.  I have consulted with the hospitalist team who have accepted patient to their service.       FINAL CLINICAL IMPRESSION(S) / ED DIAGNOSES   Final diagnoses:  Acute pulmonary edema ([place])     Rx / DC Orders   ED Discharge Orders     None        Note:  This document was prepared using Dragon voice recognition software and may include unintentional dictation errors.    Merlyn Lot, MD 04/17/21 1319

## 2021-04-17 NOTE — ED Triage Notes (Signed)
First Nurse Note:  While at dialysis today, after 3 hours of treatment, dialysis machine was stopping and was told he needed dialysis access changed out.  Patient is hypertensive.  States has not felt well x 1 week.  Arrives via [facility]  94% on RA 206/136 -- has history of HTN

## 2021-04-17 NOTE — ED Notes (Signed)
Pt returned from CT °

## 2021-04-18 ENCOUNTER — Encounter: Payer: Self-pay | Admitting: Obstetrics and Gynecology

## 2021-04-18 ENCOUNTER — Inpatient Hospital Stay
Admission: EM | Disposition: A | Discharge status: Home / Self Care | Payer: Self-pay | Source: Home / Self Care | Attending: Internal Medicine

## 2021-04-18 ENCOUNTER — Observation Stay: Payer: Medicare Other

## 2021-04-18 DIAGNOSIS — R112 Nausea with vomiting, unspecified: Secondary | ICD-10-CM | POA: Diagnosis not present

## 2021-04-18 DIAGNOSIS — J479 Bronchiectasis, uncomplicated: Secondary | ICD-10-CM | POA: Diagnosis present

## 2021-04-18 DIAGNOSIS — D72829 Elevated white blood cell count, unspecified: Secondary | ICD-10-CM | POA: Diagnosis not present

## 2021-04-18 DIAGNOSIS — N2581 Secondary hyperparathyroidism of renal origin: Secondary | ICD-10-CM | POA: Diagnosis present

## 2021-04-18 DIAGNOSIS — I10 Essential (primary) hypertension: Secondary | ICD-10-CM

## 2021-04-18 DIAGNOSIS — I132 Hypertensive heart and chronic kidney disease with heart failure and with stage 5 chronic kidney disease, or end stage renal disease: Secondary | ICD-10-CM | POA: Diagnosis present

## 2021-04-18 DIAGNOSIS — D631 Anemia in chronic kidney disease: Secondary | ICD-10-CM

## 2021-04-18 DIAGNOSIS — J810 Acute pulmonary edema: Secondary | ICD-10-CM | POA: Diagnosis present

## 2021-04-18 DIAGNOSIS — I959 Hypotension, unspecified: Secondary | ICD-10-CM | POA: Diagnosis not present

## 2021-04-18 DIAGNOSIS — F329 Major depressive disorder, single episode, unspecified: Secondary | ICD-10-CM | POA: Diagnosis present

## 2021-04-18 DIAGNOSIS — E8770 Fluid overload, unspecified: Secondary | ICD-10-CM | POA: Diagnosis not present

## 2021-04-18 DIAGNOSIS — Z992 Dependence on renal dialysis: Secondary | ICD-10-CM | POA: Diagnosis not present

## 2021-04-18 DIAGNOSIS — Z87891 Personal history of nicotine dependence: Secondary | ICD-10-CM | POA: Diagnosis not present

## 2021-04-18 DIAGNOSIS — I5032 Chronic diastolic (congestive) heart failure: Secondary | ICD-10-CM | POA: Diagnosis present

## 2021-04-18 DIAGNOSIS — J948 Other specified pleural conditions: Secondary | ICD-10-CM | POA: Diagnosis present

## 2021-04-18 DIAGNOSIS — Z515 Encounter for palliative care: Secondary | ICD-10-CM | POA: Diagnosis not present

## 2021-04-18 DIAGNOSIS — J9621 Acute and chronic respiratory failure with hypoxia: Secondary | ICD-10-CM | POA: Diagnosis not present

## 2021-04-18 DIAGNOSIS — Z8249 Family history of ischemic heart disease and other diseases of the circulatory system: Secondary | ICD-10-CM | POA: Diagnosis not present

## 2021-04-18 DIAGNOSIS — N186 End stage renal disease: Secondary | ICD-10-CM | POA: Diagnosis present

## 2021-04-18 DIAGNOSIS — Z20822 Contact with and (suspected) exposure to covid-19: Secondary | ICD-10-CM | POA: Diagnosis present

## 2021-04-18 DIAGNOSIS — E663 Overweight: Secondary | ICD-10-CM | POA: Diagnosis present

## 2021-04-18 DIAGNOSIS — J90 Pleural effusion, not elsewhere classified: Secondary | ICD-10-CM | POA: Diagnosis not present

## 2021-04-18 DIAGNOSIS — J9811 Atelectasis: Secondary | ICD-10-CM | POA: Diagnosis present

## 2021-04-18 DIAGNOSIS — Z79899 Other long term (current) drug therapy: Secondary | ICD-10-CM | POA: Diagnosis not present

## 2021-04-18 DIAGNOSIS — N185 Chronic kidney disease, stage 5: Secondary | ICD-10-CM | POA: Diagnosis not present

## 2021-04-18 DIAGNOSIS — I160 Hypertensive urgency: Secondary | ICD-10-CM | POA: Diagnosis present

## 2021-04-18 DIAGNOSIS — Z7189 Other specified counseling: Secondary | ICD-10-CM | POA: Diagnosis not present

## 2021-04-18 DIAGNOSIS — Z6826 Body mass index (BMI) 26.0-26.9, adult: Secondary | ICD-10-CM | POA: Diagnosis not present

## 2021-04-18 DIAGNOSIS — T8241XA Breakdown (mechanical) of vascular dialysis catheter, initial encounter: Secondary | ICD-10-CM | POA: Diagnosis present

## 2021-04-18 DIAGNOSIS — Y712 Prosthetic and other implants, materials and accessory cardiovascular devices associated with adverse incidents: Secondary | ICD-10-CM | POA: Diagnosis present

## 2021-04-18 DIAGNOSIS — E871 Hypo-osmolality and hyponatremia: Secondary | ICD-10-CM | POA: Diagnosis not present

## 2021-04-18 HISTORY — PX: DIALYSIS/PERMA CATHETER INSERTION: CATH118288

## 2021-04-18 LAB — BASIC METABOLIC PANEL
Anion gap: 11 (ref 5–15)
BUN: 37 mg/dL — ABNORMAL HIGH (ref 6–20)
CO2: 27 mmol/L (ref 22–32)
Calcium: 8.7 mg/dL — ABNORMAL LOW (ref 8.9–10.3)
Chloride: 96 mmol/L — ABNORMAL LOW (ref 98–111)
Creatinine, Ser: 9.53 mg/dL — ABNORMAL HIGH (ref 0.61–1.24)
GFR, Estimated: 7 mL/min — ABNORMAL LOW (ref >60)
Glucose, Bld: 87 mg/dL (ref 70–99)
Potassium: 4.6 mmol/L (ref 3.5–5.1)
Sodium: 134 mmol/L — ABNORMAL LOW (ref 135–145)

## 2021-04-18 LAB — HEPATITIS B SURFACE ANTIBODY, QUANTITATIVE: Hep B S AB Quant (Post): 19.6 m[IU]/mL (ref >9.9)

## 2021-04-18 LAB — HEPATITIS B SURFACE ANTIBODY,QUALITATIVE: Hep B S Ab: REACTIVE — AB

## 2021-04-18 SURGERY — DIALYSIS/PERMA CATHETER INSERTION
Anesthesia: Moderate Sedation

## 2021-04-18 MED ORDER — LABETALOL HCL 5 MG/ML IV SOLN
INTRAVENOUS | Status: AC
  Filled 2021-04-18: qty 4

## 2021-04-18 MED ORDER — CEFAZOLIN SODIUM-DEXTROSE 2-4 GM/100ML-% IV SOLN
2.0000 g | INTRAVENOUS | Status: AC
  Filled 2021-04-18: qty 100

## 2021-04-18 MED ORDER — FENTANYL CITRATE PF 50 MCG/ML IJ SOSY
PREFILLED_SYRINGE | INTRAMUSCULAR | Status: AC
  Filled 2021-04-18: qty 1

## 2021-04-18 MED ORDER — HEPARIN SODIUM (PORCINE) 10000 UNIT/ML IJ SOLN
INTRAMUSCULAR | Status: AC
  Filled 2021-04-18: qty 1

## 2021-04-18 MED ORDER — HYDROCODONE-ACETAMINOPHEN 5-325 MG PO TABS
1.0000 | ORAL_TABLET | Freq: Four times a day (QID) | ORAL | Status: DC | PRN
  Administered 2021-04-18 – 2021-04-26 (×12): 1 via ORAL
  Filled 2021-04-18 (×13): qty 1

## 2021-04-18 MED ORDER — MIDAZOLAM HCL 5 MG/5ML IJ SOLN
INTRAMUSCULAR | Status: AC
  Filled 2021-04-18: qty 5

## 2021-04-18 MED ORDER — CEFAZOLIN SODIUM-DEXTROSE 1-4 GM/50ML-% IV SOLN
INTRAVENOUS | Status: AC
  Filled 2021-04-18: qty 50

## 2021-04-18 MED ORDER — LABETALOL HCL 5 MG/ML IV SOLN
INTRAVENOUS | Status: DC | PRN
  Administered 2021-04-18: 10 mg via INTRAVENOUS

## 2021-04-18 MED ORDER — FENTANYL CITRATE (PF) 100 MCG/2ML IJ SOLN
INTRAMUSCULAR | Status: DC | PRN
  Administered 2021-04-18: 50 ug via INTRAVENOUS
  Administered 2021-04-18 (×2): 25 ug via INTRAVENOUS

## 2021-04-18 MED ORDER — CALCIUM ACETATE (PHOS BINDER) 667 MG PO CAPS
1334.0000 mg | ORAL_CAPSULE | Freq: Three times a day (TID) | ORAL | Status: DC
  Administered 2021-04-18 – 2021-04-26 (×10): 1334 mg via ORAL
  Filled 2021-04-18 (×24): qty 2

## 2021-04-18 MED ORDER — ASPIRIN 325 MG PO TABS
325.0000 mg | ORAL_TABLET | Freq: Every day | ORAL | Status: DC
  Administered 2021-04-18 – 2021-04-27 (×5): 325 mg via ORAL
  Filled 2021-04-18 (×10): qty 1

## 2021-04-18 SURGICAL SUPPLY — 6 items
BIOPATCH RED 1 DISK 7.0 (GAUZE/BANDAGES/DRESSINGS) ×1 IMPLANT
CATH PALIN MAXID VT KIT 19CM (CATHETERS) ×1 IMPLANT
GUIDEWIRE SUPER STIFF .035X180 (WIRE) ×1 IMPLANT
PACK ANGIOGRAPHY (CUSTOM PROCEDURE TRAY) ×1 IMPLANT
SUT MNCRL AB 4-0 PS2 18 (SUTURE) ×1 IMPLANT
SUT SILK 0 FSL (SUTURE) ×1 IMPLANT

## 2021-04-18 NOTE — Assessment & Plan Note (Addendum)
Loculated. PCCM & CT surgery recommends conservative mgmt - based on record. Patient doesn't seem interested in doing anything either for now. I've requested him to seek 2nd opinion at [facility] as an outpt

## 2021-04-18 NOTE — Progress Notes (Signed)
1648: new dressing with bloody drainage.  Pressure held for 9 minutes and Vascular Lab Team in room applying pressure dressing post pressure hold.  Report at bedside with Di Kindle, RN.

## 2021-04-18 NOTE — Progress Notes (Signed)
Patient returned to room after cath replacement.  Pressure dressing applied by PACU RN.  Upon recheck of patient, pressure dressing leaking blood down patient's chest.  New pressure dressing applied by 2 RNs.  Dressing rechecked Q15 minutes for 1 hour.  Patient c/o pain at site and MD notified.  Awaiting new orders.

## 2021-04-18 NOTE — Progress Notes (Addendum)
[facility]  ROUNDING NOTE   Subjective:   Travis Long is a 36 year old African-American male with past medical concerns including hypertension and end-stage renal disease on dialysis.  Patient presents to the emergency department from his outpatient dialysis center with complaints of shortness of breath.  Patient will be admitted under observation for Acute pulmonary edema ([place]) [J81.0] Volume overload [E87.70]  Patient is known to our clinic and receives outpatient dialysis treatments at [facility], supervised by Dr. Holley Raring.  Patient receives dialysis on a MWF schedule.    Patient resting quietly in bed, alert and oriented Dialysis received yesterday, tolerated well with UF 2.5 L removed. Achieved acceptable blood flow rate from PermCath until last hour to 30 minutes of treatment.  HD RN reports arterial pressure drops during that time. Denies current shortness of breath, patient seen later in a.m. with supervising physician on room air. Patient would like fistula evaluated, states if in a "zigzag" pattern that the outpatient clinic has a hard time sticking.  He states his preference of the fistula versus a catheter.  Objective:  Vital signs in last 24 hours:  Temp:  [98.6 F (37 C)-99.2 F (37.3 C)] 99 F (37.2 C) (01/24 1134) Pulse Rate:  [85-101] 91 (01/24 1134) Resp:  [16-26] 17 (01/24 1134) BP: (145-208)/(92-128) 145/92 (01/24 1134) SpO2:  [97 %-100 %] 99 % (01/24 1134) Weight:  [81 kg-83.8 kg] 81 kg (01/23 1845)  Weight change:  Filed Weights   04/17/21 1024 04/17/21 1521 04/17/21 1845  Weight: 79.4 kg 83.8 kg 81 kg    Intake/Output: I/O last 3 completed shifts: In: -  Out: 2540 [Other:2540]   Intake/Output this shift:  No intake/output data recorded.  Physical Exam: General: NAD, resting in bed  Head: Normocephalic, atraumatic. Moist oral mucosal membranes  Eyes: Anicteric  Lungs:  Clear to auscultation, normal effort, 3 L [place]  Heart:  Regular rate and rhythm  Abdomen:  Soft, nontender  Extremities:  No peripheral edema.  Neurologic: Nonfocal, moving all four extremities  Skin: No lesions  Access: Right IJ PermCath, Lt lower AVF    Basic Metabolic Panel: Recent Labs  Lab 04/17/21 1026 04/18/21 0538  NA 133* 134*  K 4.0 4.6  CL 95* 96*  CO2 25 27  GLUCOSE 81 87  BUN 43* 37*  CREATININE 10.12* 9.53*  CALCIUM 8.9 8.7*     Liver Function Tests: No results for input(s): AST, ALT, ALKPHOS, BILITOT, PROT, ALBUMIN in the last 168 hours. No results for input(s): LIPASE, AMYLASE in the last 168 hours. No results for input(s): AMMONIA in the last 168 hours.  CBC: Recent Labs  Lab 04/17/21 1026  WBC 7.2  HGB 8.3*  HCT 25.1*  MCV 94.4  PLT 256     Cardiac Enzymes: No results for input(s): CKTOTAL, CKMB, CKMBINDEX, TROPONINI in the last 168 hours.  BNP: Invalid input(s): POCBNP  CBG: No results for input(s): GLUCAP in the last 168 hours.  Microbiology: Results for orders placed or performed during the hospital encounter of 04/17/21  Resp Panel by RT-PCR (Flu A&B, Covid) Nasopharyngeal Swab     Status: None   Collection Time: 04/17/21  1:00 PM   Specimen: Nasopharyngeal Swab; Nasopharyngeal(NP) swabs in vial transport medium  Result Value Ref Range Status   SARS Coronavirus 2 by RT PCR NEGATIVE NEGATIVE Final    Comment: (NOTE) SARS-CoV-2 target nucleic acids are NOT DETECTED.  The SARS-CoV-2 RNA is generally detectable in upper respiratory specimens during the acute phase  of infection. The lowest concentration of SARS-CoV-2 viral copies this assay can detect is 138 copies/mL. A negative result does not preclude SARS-Cov-2 infection and should not be used as the sole basis for treatment or other patient management decisions. A negative result may occur with  improper specimen collection/handling, submission of specimen other than nasopharyngeal swab, presence of viral mutation(s) within  the areas targeted by this assay, and inadequate number of viral copies(<138 copies/mL). A negative result must be combined with clinical observations, patient history, and epidemiological information. The expected result is Negative.  Fact Sheet for Patients:  EntrepreneurPulse.com.au  Fact Sheet for Healthcare Providers:  IncredibleEmployment.be  This test is no t yet approved or cleared by the [place] FDA and  has been authorized for detection and/or diagnosis of SARS-CoV-2 by FDA under an Emergency Use Authorization (EUA). This EUA will remain  in effect (meaning this test can be used) for the duration of the COVID-19 declaration under Section 564(b)(1) of the Act, 21 U.S.C.section 360bbb-3(b)(1), unless the authorization is terminated  or revoked sooner.       Influenza A by PCR NEGATIVE NEGATIVE Final   Influenza B by PCR NEGATIVE NEGATIVE Final    Comment: (NOTE) The Xpert Xpress SARS-CoV-2/FLU/RSV plus assay is intended as an aid in the diagnosis of influenza from Nasopharyngeal swab specimens and should not be used as a sole basis for treatment. Nasal washings and aspirates are unacceptable for Xpert Xpress SARS-CoV-2/FLU/RSV testing.  Fact Sheet for Patients: EntrepreneurPulse.com.au  Fact Sheet for Healthcare Providers: IncredibleEmployment.be  This test is not yet approved or cleared by the [place] FDA and has been authorized for detection and/or diagnosis of SARS-CoV-2 by FDA under an Emergency Use Authorization (EUA). This EUA will remain in effect (meaning this test can be used) for the duration of the COVID-19 declaration under Section 564(b)(1) of the Act, 21 U.S.C. section 360bbb-3(b)(1), unless the authorization is terminated or revoked.  Performed at [facility], [address] [place], [place] [address]     Coagulation Studies: No results for input(s):  LABPROT, INR in the last 72 hours.  Urinalysis: No results for input(s): COLORURINE, LABSPEC, PHURINE, GLUCOSEU, HGBUR, BILIRUBINUR, KETONESUR, PROTEINUR, UROBILINOGEN, NITRITE, LEUKOCYTESUR in the last 72 hours.  Invalid input(s): APPERANCEUR    Imaging: DG Chest 2 View  Result Date: 04/18/2021 CLINICAL DATA:  Shortness of breath.  End-stage renal disease. EXAM: CHEST - 2 VIEW COMPARISON:  04/17/2021 FINDINGS: No critical change since yesterday. Central line tip in the SVC above the right atrium. Bilateral pleural fluid collections, larger on the left than the right. Multiple air-fluid levels on the left appears similar. Associated pulmonary atelectasis/infiltrate appears the same. IMPRESSION: No change since yesterday. Bilateral effusions, larger on the left than the right, with multiple air-fluid levels on the left. Associated pulmonary volume loss/infiltrate. Electronically Signed   By: Nelson Chimes M.D.   On: 04/18/2021 10:21   DG Chest 2 View  Result Date: 04/17/2021 CLINICAL DATA:  Shortness of breath.  Dialysis. EXAM: CHEST - 2 VIEW COMPARISON:  March 15, 2021 FINDINGS: Dual lumen dialysis catheter with tip projecting over the superior cavoatrial junction. The heart size and mediastinal contours are obscured. Large right pleural effusion with atelectasis versus infiltrate involving the left lung. Two focal gas fluid levels in the left hemithorax 1 of the mid lung level the other at the apex may reflect focal areas of aerated lung but abscess not excluded consider further evaluation with chest CT. Small right pleural  effusion. Bilateral pulmonary edema. The visualized skeletal structures are unchanged. IMPRESSION: Large left pleural effusion with a combination of edema and atelectasis versus infiltrate in the left lung, additionally there are 2 focal gas fluid-levels in the left hemithorax 1 of the mid lung level the other in the apex which may reflect focal areas of aerated lung but abscess  not excluded, consider further evaluation with chest CT. Small right pleural effusion with right lung atelectasis and edema. Electronically Signed   By: Dahlia Bailiff M.D.   On: 04/17/2021 11:19   CT Chest Wo Contrast  Result Date: 04/17/2021 CLINICAL DATA:  Pneumonia. Complication suspected. Abnormal chest radiograph. After 3 hours of dialysis treatment today common dialysis machine with stopping was told he needed dialysis access changed out. Hypertension. Patient has not felt well for 1 week. EXAM: CT CHEST WITHOUT CONTRAST TECHNIQUE: Multidetector CT imaging of the chest was performed following the standard protocol without IV contrast. RADIATION DOSE REDUCTION: This exam was performed according to the departmental dose-optimization program which includes automated exposure control, adjustment of the mA and/or kV according to patient size and/or use of iterative reconstruction technique. COMPARISON:  Chest radiographs 04/17/2021, AP chest 03/15/2021, 03/09/2021, 09/16/2020; CT chest 03/06/2021 FINDINGS: Right internal jugular dual-lumen central venous catheter with tips within the central superior vena cava. Cardiovascular: Heart size is again mildly enlarged. Trace pericardial fluid is slightly increased from prior. No thoracic aortic aneurysm. Mediastinum/Nodes: No pathologically enlarged axillary lymph nodes by CT criteria. There are again multiple mildly enlarged mediastinal lymph nodes. The largest is again seen within the inferior right paratracheal region measuring up to 1.3 cm in short axis, not significantly changed from prior when measured in a similar manner. Again these are likely reactive. No definite hilar lymphadenopathy is seen, within the limitations of lack of IV contrast.The thyroid gland is grossly unremarkable. The esophagus follows a normal course of normal caliber. Lungs/Pleura: There is again opacification of the distal segmental left lower lobe airways. There is again high-grade  consolidation within the posterior left lower lobe. Moderate bilateral pleural effusions are mildly increased from prior. Interval increase in air component of left-sided hydropneumothorax. This includes an air component measuring up to approximately 2.8 cm in craniocaudal dimension on the current CT, increased from 2.1 cm on 03/06/2021 CT (as measured on sagittal series 6, image 122). There is also interval increase in air within the more inferior posterolateral hydropneumothorax component on this supine CT with air newly seen within the non dependent regions of the left mid and lower lung (axial series 3, images 84 through 99). Previously a small amount of air was seen within the posteromedial left lower lobe pleural space, and this is still mild but slightly increased from prior (axial series 3 images 63 through 84). Overall there is interval increase in fluid and air components of this left-sided hydropneumothorax. There again mild right peripheral paraseptal cystic emphysematous changes. Upper Abdomen: There is partial visualization of the kidneys which demonstrate moderate to high-grade atrophy consistent with known chronic kidney disease. Moderate to high-grade bilateral gynecomastia is again noted. Musculoskeletal: There is diffuse increased bone density again seen, consistent with chronic renal failure. IMPRESSION:: IMPRESSION: 1. Compared to 03/06/2021, there is interval increase in the fluid and air components of a moderate left hydropneumothorax. Interval mild increase in moderate bilateral pleural effusions. There again appear to be multiple loculations within the hydropneumothorax, also increased from prior. 2. There is again high-grade consolidation of the posterior left lower lobe either pneumonia versus round  atelectasis. 3. Note is made of prior 03/09/2021 basilar pleural pigtail catheter placement in the region of this hydropneumothorax on prior 03/09/2021 radiographs. Following removal of this  pigtail catheter, the extrapleural air and fluid appears to have increased over time. Electronically Signed   By: Yvonne Kendall M.D.   On: 04/17/2021 14:17     Medications:    sodium chloride     sodium chloride     sodium chloride     [START ON 04/19/2021]  ceFAZolin (ANCEF) IV      heparin       amLODipine  10 mg Oral Daily   aspirin  325 mg Oral Daily   calcium acetate  1,334 mg Oral TID WC   carvedilol  25 mg Oral BID WC   Chlorhexidine Gluconate Cloth  6 each Topical Q0600   heparin  5,000 Units Subcutaneous Q8H   hydrALAZINE  100 mg Oral Q8H   minoxidil  5 mg Oral Daily   pantoprazole  40 mg Oral Daily   sodium chloride flush  3 mL Intravenous Q12H     Assessment/ Plan:  Travis Long is a 36 y.o.  male past medical concerns including hypertension and end-stage renal disease on dialysis.  Patient presents to the emergency department from his outpatient dialysis center with complaints of shortness of breath.  Patient will be admitted under observation for Acute pulmonary edema ([facility]) [J81.0] Volume overload [E87.70]  [facility] /MWF/right IJ PermCath  Acute respiratory distress with end-stage renal disease on dialysis  Received dialysis treatment yesterday with UF goal 2.5 L achieved.  Patient blood flow rate achieved with current PermCath until left hour to 30 minutes of treatment.  Blood flow rate reduced due to declining arterial pressures.  We will consult vascular surgery for PermCath exchange today or tomorrow and will order aspirin 325 mg for antiplatelet therapy.  Vascular surgery asked to evaluate left lower fistula.  Patient made n.p.o. for procedure.  Next treatment scheduled for Wednesday.  2. Anemia of chronic kidney disease  Lab Results  Component Value Date   HGB 8.3 (L) 04/17/2021  Hemoglobin below target, continue EPO 10,000 units IV with dialysis  3. Secondary Hyperparathyroidism:   Lab Results  Component Value Date   CALCIUM 8.7 (L)  04/18/2021   PHOS 7.2 (H) 03/15/2021    Will monitor bone mineral metabolism during this admission.  Continue calcium acetate, 2 capsules with meals.  4.  Hypertension with chronic kidney disease.  Home regimen includes amlodipine, carvedilol, minoxidil, and hydralazine.  All currently prescribed at this time.  BP 145/92.   LOS: 0   1/24/20231:05 PM

## 2021-04-18 NOTE — Progress Notes (Signed)
Bloody drainage noted on new dressing over perm cath.  Pressure applied and carefully removed dressing, continued to hold pressure for 10 minutes and bleeding ceased.  New dressing applied.  Small amount of swelling noted under skin above perm cath insertion site with no changes.  Logan with Vascular Lab Team assessed and agreed with holding pressure and applying new dressing.  During this, patient with small amount of nausea and vomiting and post vomiting states "I feel better" and asked for something to eat.

## 2021-04-18 NOTE — Consult Note (Signed)
@LOGO @   MRN : [id number]  Travis Long is a 36 y.o. (March 12, 1986) male who presents with chief complaint of catheter is not working.  History of Present Illness:   I have asked to evaluate the patient by Dr. Candiss Norse.  Patient is a 36 year old gentleman admitted to [facility] with volume overload earlier this week.   The patient is seen for evaluation of dialysis access.  He has a wrist fistula but this has not matured and therefore has not been functional as access.  Current access is via a catheter which is functioning poorly.  There have not been any episodes of catheter infection.  The patient denies amaurosis fugax or recent TIA symptoms. There are no recent neurological changes noted. The patient denies claudication symptoms or rest pain symptoms. The patient denies history of DVT, PE or superficial thrombophlebitis. The patient denies recent episodes of angina or shortness of breath.    No outpatient medications have been marked as taking for the 04/17/21 encounter [facility] Encounter).    Past Medical History:  Diagnosis Date   ESRD on hemodialysis [facility])    Hypertension     Past Surgical History:  Procedure Laterality Date   A/V FISTULAGRAM Left 03/09/2021   Procedure: A/V Fistulagram;  Surgeon: Algernon Huxley, MD;  Location: [facility];  Service: Cardiovascular;  Laterality: Left;   AV FISTULA PLACEMENT Left    IR CATHETER TUBE CHANGE  03/09/2021    Social History Social History   Tobacco Use   Smoking status: Former    Types: Cigarettes   Smokeless tobacco: Never  Substance Use Topics   Alcohol use: Not Currently   Drug use: Never    Family History Family History  Problem Relation Age of Onset   Hypertension Mother    Diabetes Mellitus II Mother     No Known Allergies   REVIEW OF SYSTEMS (Negative unless checked)  Constitutional: [] Weight loss  [] Fever  [] Chills Cardiac: [] Chest pain   [] Chest pressure   [] Palpitations    [] Shortness of breath when laying flat   [] Shortness of breath with exertion. Vascular:  [] Pain in legs with walking   [] Pain in legs at rest  [] History of DVT   [] Phlebitis   [] Swelling in legs   [] Varicose veins   [] Non-healing ulcers Pulmonary:   [] Uses home oxygen   [] Productive cough   [] Hemoptysis   [] Wheeze  [] COPD   [] Asthma Neurologic:  [] Dizziness   [] Seizures   [] History of stroke   [] History of TIA  [] Aphasia   [] Vissual changes   [] Weakness or numbness in arm   [] Weakness or numbness in leg Musculoskeletal:   [] Joint swelling   [] Joint pain   [] Low back pain Hematologic:  [] Easy bruising  [] Easy bleeding   [] Hypercoagulable state   [] Anemic Gastrointestinal:  [] Diarrhea   [] Vomiting  [] Gastroesophageal reflux/heartburn   [] Difficulty swallowing. Genitourinary:  [x] Chronic kidney disease   [] Difficult urination  [] Frequent urination   [] Blood in urine Skin:  [] Rashes   [] Ulcers  Psychological:  [] History of anxiety   []  History of major depression.  Physical Examination  Vitals:   04/18/21 0529 04/18/21 0730 04/18/21 1134 04/18/21 1448  BP: (!) 173/112 (!) 192/125 (!) 145/92 (!) 153/98  Pulse: 89 (!) 101 91 90  Resp: 18 19 17  (!) 24  Temp: 98.6 F (37 C) 98.9 F (37.2 C) 99 F (37.2 C) 98.3 F (36.8 C)  TempSrc:  Oral Oral Oral  SpO2: 100% 98% 99% 96%  Weight:      Height:       Body mass index is 26.37 kg/m. Gen: WD/WN, NAD Head: [place]/AT, No temporalis wasting.  Ear/Nose/Throat: Hearing grossly intact, nares w/o erythema or drainage Eyes: PER, EOMI, sclera nonicteric.  Neck: Supple, no gross masses or lesions.  No JVD.  Pulmonary:  Good air movement, no audible wheezing, no use of accessory muscles.  Cardiac: RRR, precordium non-hyperdynamic. Vascular:   right IJ catheter and left forearm fistula good thrill good bruit Vessel Right Left  Radial Palpable Palpable  Brachial Palpable Palpable  Gastrointestinal: soft, non-distended. No guarding/no peritoneal signs.   Musculoskeletal: M/S 5/5 throughout.  No deformity.  Neurologic: CN 2-12 intact. Pain and light touch intact in extremities.  Symmetrical.  Speech is fluent. Motor exam as listed above. Psychiatric: Judgment intact, Mood & affect appropriate for pt's clinical situation. Dermatologic: No rashes or ulcers noted.  No changes consistent with cellulitis.   CBC Lab Results  Component Value Date   WBC 7.2 04/17/2021   HGB 8.3 (L) 04/17/2021   HCT 25.1 (L) 04/17/2021   MCV 94.4 04/17/2021   PLT 256 04/17/2021    BMET    Component Value Date/Time   NA 134 (L) 04/18/2021 0538   K 4.6 04/18/2021 0538   CL 96 (L) 04/18/2021 0538   CO2 27 04/18/2021 0538   GLUCOSE 87 04/18/2021 0538   BUN 37 (H) 04/18/2021 0538   CREATININE 9.53 (H) 04/18/2021 0538   CALCIUM 8.7 (L) 04/18/2021 0538   GFRNONAA 7 (L) 04/18/2021 0538   Estimated Creatinine Clearance: 11 mL/min (A) (by C-G formula based on SCr of 9.53 mg/dL (H)).  COAG Lab Results  Component Value Date   INR 1.2 03/07/2021   INR 1.2 12/01/2020   INR 1.1 09/16/2020    Radiology DG Chest 2 View  Result Date: 04/18/2021 CLINICAL DATA:  Shortness of breath.  End-stage renal disease. EXAM: CHEST - 2 VIEW COMPARISON:  04/17/2021 FINDINGS: No critical change since yesterday. Central line tip in the SVC above the right atrium. Bilateral pleural fluid collections, larger on the left than the right. Multiple air-fluid levels on the left appears similar. Associated pulmonary atelectasis/infiltrate appears the same. IMPRESSION: No change since yesterday. Bilateral effusions, larger on the left than the right, with multiple air-fluid levels on the left. Associated pulmonary volume loss/infiltrate. Electronically Signed   By: Nelson Chimes M.D.   On: 04/18/2021 10:21   DG Chest 2 View  Result Date: 04/17/2021 CLINICAL DATA:  Shortness of breath.  Dialysis. EXAM: CHEST - 2 VIEW COMPARISON:  March 15, 2021 FINDINGS: Dual lumen dialysis catheter with  tip projecting over the superior cavoatrial junction. The heart size and mediastinal contours are obscured. Large right pleural effusion with atelectasis versus infiltrate involving the left lung. Two focal gas fluid levels in the left hemithorax 1 of the mid lung level the other at the apex may reflect focal areas of aerated lung but abscess not excluded consider further evaluation with chest CT. Small right pleural effusion. Bilateral pulmonary edema. The visualized skeletal structures are unchanged. IMPRESSION: Large left pleural effusion with a combination of edema and atelectasis versus infiltrate in the left lung, additionally there are 2 focal gas fluid-levels in the left hemithorax 1 of the mid lung level the other in the apex which may reflect focal areas of aerated lung but abscess not excluded, consider further evaluation with chest CT. Small right pleural effusion with right lung atelectasis and edema. Electronically Signed  By: Dahlia Bailiff M.D.   On: 04/17/2021 11:19   CT Chest Wo Contrast  Result Date: 04/17/2021 CLINICAL DATA:  Pneumonia. Complication suspected. Abnormal chest radiograph. After 3 hours of dialysis treatment today common dialysis machine with stopping was told he needed dialysis access changed out. Hypertension. Patient has not felt well for 1 week. EXAM: CT CHEST WITHOUT CONTRAST TECHNIQUE: Multidetector CT imaging of the chest was performed following the standard protocol without IV contrast. RADIATION DOSE REDUCTION: This exam was performed according to the departmental dose-optimization program which includes automated exposure control, adjustment of the mA and/or kV according to patient size and/or use of iterative reconstruction technique. COMPARISON:  Chest radiographs 04/17/2021, AP chest 03/15/2021, 03/09/2021, 09/16/2020; CT chest 03/06/2021 FINDINGS: Right internal jugular dual-lumen central venous catheter with tips within the central superior vena cava.  Cardiovascular: Heart size is again mildly enlarged. Trace pericardial fluid is slightly increased from prior. No thoracic aortic aneurysm. Mediastinum/Nodes: No pathologically enlarged axillary lymph nodes by CT criteria. There are again multiple mildly enlarged mediastinal lymph nodes. The largest is again seen within the inferior right paratracheal region measuring up to 1.3 cm in short axis, not significantly changed from prior when measured in a similar manner. Again these are likely reactive. No definite hilar lymphadenopathy is seen, within the limitations of lack of IV contrast.The thyroid gland is grossly unremarkable. The esophagus follows a normal course of normal caliber. Lungs/Pleura: There is again opacification of the distal segmental left lower lobe airways. There is again high-grade consolidation within the posterior left lower lobe. Moderate bilateral pleural effusions are mildly increased from prior. Interval increase in air component of left-sided hydropneumothorax. This includes an air component measuring up to approximately 2.8 cm in craniocaudal dimension on the current CT, increased from 2.1 cm on 03/06/2021 CT (as measured on sagittal series 6, image 122). There is also interval increase in air within the more inferior posterolateral hydropneumothorax component on this supine CT with air newly seen within the non dependent regions of the left mid and lower lung (axial series 3, images 84 through 99). Previously a small amount of air was seen within the posteromedial left lower lobe pleural space, and this is still mild but slightly increased from prior (axial series 3 images 63 through 84). Overall there is interval increase in fluid and air components of this left-sided hydropneumothorax. There again mild right peripheral paraseptal cystic emphysematous changes. Upper Abdomen: There is partial visualization of the kidneys which demonstrate moderate to high-grade atrophy consistent with  known chronic kidney disease. Moderate to high-grade bilateral gynecomastia is again noted. Musculoskeletal: There is diffuse increased bone density again seen, consistent with chronic renal failure. IMPRESSION:: IMPRESSION: 1. Compared to 03/06/2021, there is interval increase in the fluid and air components of a moderate left hydropneumothorax. Interval mild increase in moderate bilateral pleural effusions. There again appear to be multiple loculations within the hydropneumothorax, also increased from prior. 2. There is again high-grade consolidation of the posterior left lower lobe either pneumonia versus round atelectasis. 3. Note is made of prior 03/09/2021 basilar pleural pigtail catheter placement in the region of this hydropneumothorax on prior 03/09/2021 radiographs. Following removal of this pigtail catheter, the extrapleural air and fluid appears to have increased over time. Electronically Signed   By: Yvonne Kendall M.D.   On: 04/17/2021 14:17     Assessment/Plan Complication of dialysis access: Patient does not have a functioning catheter and his fistula is not yet usable and therefore we will replace  his tunneled catheter.  Plan is for replacement same venous access.  Risks and benefits of been reviewed all questions answered patient agrees to proceed. End-stage renal disease requiring hemodialysis: Patient has a functioning fistula but is not adequate for access we will evaluate this for revision.  In the meantime we will continue dialysis once his catheters been secured. Hypertension: Continue antihypertensive medications as already ordered, these medications have been reviewed and there are no changes at this time.    Hortencia Pilar, MD  04/18/2021 3:01 PM

## 2021-04-18 NOTE — Assessment & Plan Note (Signed)
S/p HD y'day, HD cathetar changed by vascular today as existing one wasn't malfunctioning

## 2021-04-18 NOTE — Assessment & Plan Note (Signed)
Resolved now. Likely due to volume overload and flash pulmo edema

## 2021-04-18 NOTE — Op Note (Signed)
OPERATIVE NOTE   PROCEDURE: Insertion of tunneled dialysis catheter right IJ approach same venous access.  PRE-OPERATIVE DIAGNOSIS: Nonfunction of existing tunneled dialysis catheter, and stage renal disease requiring hemodialysis   POST-OPERATIVE DIAGNOSIS: Same SURGEON: Hortencia Pilar  ANESTHESIA: Conscious sedation was administered under my direct supervision by the interventional radiology RN.  IV Versed plus fentanyl were utilized. Continuous ECG, pulse oximetry and blood pressure was monitored throughout the entire procedure.  Conscious sedation was for a total of 27 minutes and 12 seconds.  ESTIMATED BLOOD LOSS: Minimal cc  CONTRAST USED:  None  FLUOROSCOPY TIME: 0.2 minutes  INDICATIONS:   Travis Long is a 36 y.o.y.o. male who presents with poor flow and nonfunction of the tunneled dialysis catheter.  Adequate dialysis has not been possible.  DESCRIPTION: After obtaining full informed written consent, the patient was positioned supine. The right neck and chest wall was prepped and draped in a sterile fashion. The cuff is localized and using blunt and sharp dissection it is freed from the surrounding adhesions.  The existing catheter is then transected proximal to the cuff.  The guidewire is advanced without difficulty under fluoroscopy.  Dilators are passed over the wire as needed and the tunneled dialysis catheter is fed into the central venous system without difficulty.  Under fluoroscopy the catheter tip positioned at the atrial caval junction.  Both lumens aspirate and flush easily. After verification of smooth contour with proper tip position under fluoroscopy the catheter is packed with 5000 units of heparin per lumen.  Catheter secured to the skin of the right chest wall with 0 silk. A sterile dressing is applied with a Biopatch.  COMPLICATIONS: None  CONDITION: Good  Hortencia Pilar [facility] Office:  [phone]   04/18/2021,4:40 PM

## 2021-04-18 NOTE — Hospital Course (Signed)
36 y.o. male with medical history significant for esrd on mwf hemodialysis, HTN, admitted for pulmo edema and volume overload with malfunction of HD cathetar  1/23 - HD performed 1/24 - right IJ tunnel HD cathetar change by vascular

## 2021-04-18 NOTE — Assessment & Plan Note (Signed)
HD per nephro

## 2021-04-18 NOTE — Progress Notes (Signed)
°  Progress Note   Patient: Travis Long FBP:[id number] DOB: 02-02-1986 DOA: 04/17/2021     0 DOS: the patient was seen and examined on 04/18/2021   Brief hospital course: 36 y.o. male with medical history significant for esrd on mwf hemodialysis, HTN, admitted for pulmo edema and volume overload with malfunction of HD cathetar  1/23 - HD performed 1/24 - right IJ tunnel HD cathetar change by vascular   Assessment and Plan * Volume overload- (present on admission) S/p HD y'day, HD cathetar changed by vascular today as existing one wasn't malfunctioning  Hypertensive urgency- (present on admission) Resolved now. Likely due to volume overload and flash pulmo edema  Pleural effusion- (present on admission) Loculated. PCCM & CT surgery recommends conservative mgmt - based on record. Patient doesn't seem interested in doing anything either for now. I've requested him to seek 2nd opinion at [facility] as an outpt  ESRD on hemodialysis ([place]) HD per nephro   Subjective: wants to eat but agreeable to fix his HD cathetar for now and requesting to get his fistula use for HD. Not interested in pursuing any work up for his effusion for now. He's very clear about wanting to leave tomorrow after HD  Objective Vital signs were reviewed and unremarkable.  Constitutional: No acute distress Head: Atraumatic Eyes: Conjunctiva clear ENM: Moist mucous membranes. Normal dentition.  Neck: Supple Respiratory: rales b/l bases Cardiovascular: Regular rate and rhythm. No murmurs/rubs/gallops. Abdomen: Non-tender, non-distended. No masses. No rebound or guarding. Positive bowel sounds. Musculoskeletal: No joint deformity upper and lower extremities. Normal ROM, no contractures. Normal muscle tone.  Access - Right IJ permcath, LUE av fistula Extremities: No peripheral edema. Palpable peripheral pulses. Neurologic: Alert, moving all 4 extremities. Psychiatric: Normal insight and judgement.  Data  Reviewed:  There are no new results to review at this time.  Family Communication: none   Disposition: Status is: Inpatient  Remains inpatient appropriate because: getting HD cathetar change and needs HD tomorrow     DVT Prophylaxis - heparin SQ   Time spent: 35 minutes  Author: Max Sane, MD 04/18/2021 9:19 PM  For on call review [email or web address].

## 2021-04-18 NOTE — Progress Notes (Signed)
Chaplain On-Call responded to [place] Order for Advance Directives information for the patient.  Chaplain met the patient, who is awaiting imminent replacement of a catheter in order to receive dialysis today.  Chaplain provided the AD documents and education for the patient, who will contact Chaplains if he needs further assistance.  Chaplain Pollyann Samples M.Div., [facility]

## 2021-04-19 ENCOUNTER — Encounter: Payer: Self-pay | Admitting: Vascular Surgery

## 2021-04-19 DIAGNOSIS — I160 Hypertensive urgency: Secondary | ICD-10-CM

## 2021-04-19 DIAGNOSIS — J90 Pleural effusion, not elsewhere classified: Secondary | ICD-10-CM

## 2021-04-19 LAB — COMPREHENSIVE METABOLIC PANEL
ALT: 7 U/L (ref 0–44)
AST: 11 U/L — ABNORMAL LOW (ref 15–41)
Albumin: 3.3 g/dL — ABNORMAL LOW (ref 3.5–5.0)
Alkaline Phosphatase: 55 U/L (ref 38–126)
Anion gap: 15 (ref 5–15)
BUN: 56 mg/dL — ABNORMAL HIGH (ref 6–20)
CO2: 25 mmol/L (ref 22–32)
Calcium: 9 mg/dL (ref 8.9–10.3)
Chloride: 93 mmol/L — ABNORMAL LOW (ref 98–111)
Creatinine, Ser: 12.99 mg/dL — ABNORMAL HIGH (ref 0.61–1.24)
GFR, Estimated: 5 mL/min — ABNORMAL LOW (ref >60)
Glucose, Bld: 108 mg/dL — ABNORMAL HIGH (ref 70–99)
Potassium: 5 mmol/L (ref 3.5–5.1)
Sodium: 133 mmol/L — ABNORMAL LOW (ref 135–145)
Total Bilirubin: 0.5 mg/dL (ref 0.3–1.2)
Total Protein: 8 g/dL (ref 6.5–8.1)

## 2021-04-19 LAB — CBC WITH DIFFERENTIAL/PLATELET
Abs Immature Granulocytes: 0.02 10*3/uL (ref 0.00–0.07)
Basophils Absolute: 0 10*3/uL (ref 0.0–0.1)
Basophils Relative: 1 %
Eosinophils Absolute: 0.8 10*3/uL — ABNORMAL HIGH (ref 0.0–0.5)
Eosinophils Relative: 10 %
HCT: 22.9 % — ABNORMAL LOW (ref 39.0–52.0)
Hemoglobin: 7.6 g/dL — ABNORMAL LOW (ref 13.0–17.0)
Immature Granulocytes: 0 %
Lymphocytes Relative: 11 %
Lymphs Abs: 0.9 10*3/uL (ref 0.7–4.0)
MCH: 31.4 pg (ref 26.0–34.0)
MCHC: 33.2 g/dL (ref 30.0–36.0)
MCV: 94.6 fL (ref 80.0–100.0)
Monocytes Absolute: 0.8 10*3/uL (ref 0.1–1.0)
Monocytes Relative: 10 %
Neutro Abs: 5.4 10*3/uL (ref 1.7–7.7)
Neutrophils Relative %: 68 %
Platelets: 247 10*3/uL (ref 150–400)
RBC: 2.42 MIL/uL — ABNORMAL LOW (ref 4.22–5.81)
RDW: 14.6 % (ref 11.5–15.5)
WBC: 7.9 10*3/uL (ref 4.0–10.5)
nRBC: 0 % (ref 0.0–0.2)

## 2021-04-19 LAB — MAGNESIUM: Magnesium: 1.9 mg/dL (ref 1.7–2.4)

## 2021-04-19 LAB — PHOSPHORUS: Phosphorus: 7.5 mg/dL — ABNORMAL HIGH (ref 2.5–4.6)

## 2021-04-19 MED ORDER — HEPARIN SODIUM (PORCINE) 1000 UNIT/ML IJ SOLN
INTRAMUSCULAR | Status: AC
  Filled 2021-04-19: qty 10

## 2021-04-19 NOTE — Progress Notes (Signed)
PROGRESS NOTE    Travis Long  HYW:[id number] DOB: January 16, 1986 DOA: 04/17/2021 PCP: Pcp, No  Brief Narrative:  Patient is a 36 year old African-American male with past medical history significant for but not limited to ESRD on hemodialysis Monday Wednesday Friday, hypertension as well as other comorbidities who presented with pulmonary edema and volume overload with HD catheter malfunction.  On 04/17/2020 EKG was performed and 04/18/2021 is right IJ tunneled catheter was exchanged by vascular.  After that he had some pulsatile bleeding but is now improved.  He is underwent hemodialysis on 123 and is undergoing hemodialysis again today.  His hypertensive urgency is improved and he does continue to have some volume overload and flash pulmonary edema.  He had a CT of the chest done which showed a high-grade consolidation in the left lower lobe either pneumonia versus atelectasis as well as a hydropneumothorax that appears increased from prior CT scan done in December 2022.  He is seen by [name], CT surgery last hospitalization and case was discussed with Dr. Cyndia Bent from this hospitalization and Dr. Cyndia Bent feels that he has had a complex multiloculated left pleural effusion of several months of duration that was treated with a thoracentesis and pigtail catheter insertion for tPA at last hospitalization and he feels that this patient will never be a surgery candidate so CT surgery feels there is nothing to intervene on.  They feel that basically his lung will never fully re-expand due to scar tissue from his effusions.  Pulmonary Dr. Mortimer Fries was consluted and felt they could not offer anything at this time and so Dr. Lanney Gins was consulted for further evaluation and management and to try to help set up CT Surgery at [facility] for Second opinion.   Assessment & Plan:   Principal Problem:   Volume overload Active Problems:   ESRD on hemodialysis (HCC)   Hypertension   Anemia in ESRD (end-stage renal disease)  (HCC)   Pleural effusion   Hypertensive urgency  ESRD on Hemodialysis Monday Wednesday Friday Hyperphosphatemia -Was dialyzed on Monday and was dialyzed again today.  His HD catheter was changed by vascular on 04/18/2021 given that was his malfunctioning -Patient's BUNs/creatinine went from 43/10.12 -> 37/9.53 -> 56/12.99 -We will continue monitor for bleeding and his next treatment is scheduled for Friday -Patient's Phos Level was 7.5 -Vascular surgery is following and the next step is to perform the left wrist fistulogram to ensure that the outflow from the proximal forearm centrally is widely patent and once this is verified they will move forward with revision of his fistula in the operating room -C/w Cal -Further care per Nephro  Anemia of chronic kidney disease -Nephrology is following and his hemoglobin is below target and dropped from 8.3 to 7.6 -Currently going to get EPO -Continue to monitor for signs and symptoms of bleeding; currently no overt bleeding noted but he did have some yesterday when his [facility] was placed -Patient has had no bleeding today in dialysis went well  Hypertensive Urgency -Was present on admission likely in the setting of volume overload/pulmonary edema -This is resolved now -Blood pressure remains elevated -C/w Home Amlodipine 10 mg po Daily, Carvedilol 25 mg po BID, Hydralazine 100 mg po q8h, and Minoxidil 5 mg po Daily  -Last blood pressure reading was 141/79  Left lower lobe pneumonia with high-grade consolidation versus atelectasis and hydropneumothorax -Appears to be increased from CT scan on 03/06/2021 -He seen by CT surgery Dr. Cyndia Bent and Dr. Cyndia Bent felt hey feel that basically  his lung will never fully re-expand due to scar tissue from his effusions and felt that CT surgery had nothing else to offer at this time and felt that his pleural biopsy of decortication would be high risk -Dr. Glendale Chard was consulted but he recommended getting a second opinion  by CT surgery at [facility] or [facility] -Have consulted Dr. Lanney Gins for further evaluation and attempt to get him set up with [facility] CT surgery  GERD/GI Prophylaxis -C/w Pantoprazole 40 mg    DVT prophylaxis: Heparin 5,000 units sq q8h Code Status: FULL CODE  Family Communication: No family present at bedside  Disposition Plan: Pending further clinical Improvement and clearance by specialists   Status is: Inpatient  Remains inpatient appropriate because: He needs further work-up and improvement in his respiratory status prior to safe discharge disposition and clearance by pulmonary as well as vascular surgery  Consultants:  PCCM/pulmonary Dr. Lanney Gins Discussed the case with Dr. Cyndia Bent of Cardiothoracic Surgery Nephrology Vascular surgery  Procedures: HD  Duration of a tunneled dialysis catheter of the right IJ with the same venous approach by Dr. Delana Meyer on 04/18/21  Antimicrobials:  Anti-infectives (From admission, onward)    Start     Dose/Rate Route Frequency Ordered Stop   04/19/21 0600  ceFAZolin (ANCEF) IVPB 2g/100 mL premix        2 g 200 mL/hr over 30 Minutes Intravenous On call to O.R. 04/18/21 1203 04/20/21 0559   04/18/21 1441  ceFAZolin (ANCEF) 1-4 GM/50ML-% IVPB       Note to Pharmacy: Corlis Hove H: cabinet override      04/18/21 1441 04/19/21 0244        Subjective: Seen and examined at bedside in the dialysis unit he was still complaining of some shortness of breath and still complaining of some pain at his insertion site of his tunneled catheter.  Was concerned as 1 of bleeding.  No nausea or vomiting.  States that he is extremely short of breath at times.  No lightheadedness or dizziness.  No other concerns or complaints at this time.  Objective: Vitals:   04/19/21 1345 04/19/21 1415 04/19/21 1434 04/19/21 1554  BP: 128/67 (!) 141/72 126/73 (!) 141/79  Pulse: 83  (!) 57 93  Resp: (!) 23 13 18 17   Temp: 98.7 F (37.1 C)  98.4 F (36.9 C) 98.6 F (37 C)   TempSrc: Oral  Oral Oral  SpO2: 98%  99% 99%  Weight: 80.9 kg     Height:        Intake/Output Summary (Last 24 hours) at 04/19/2021 1710 Last data filed at 04/19/2021 1345 Gross per 24 hour  Intake --  Output 2001 ml  Net -2001 ml   Filed Weights   04/17/21 1845 04/19/21 1030 04/19/21 1345  Weight: 81 kg 82.9 kg 80.9 kg   Examination: Physical Exam:  Constitutional: WN/WD overweight African-American male currently in no acute distress appears calm Eyes: Lids and conjunctivae normal, sclerae anicteric  ENMT: External Ears, Nose appear normal. Grossly normal hearing. Mucous membranes are moist.  Neck: Appears normal, supple, no cervical masses, normal ROM, no appreciable thyromegaly; no appreciable JVD Respiratory: Diminished to auscultation bilaterally with coarse breath sounds and some crackles worse on the left compared to the right and some slight rhonchi.  Has a normal respiratory effort but he is wearing supplemental oxygen via nasal cannula Cardiovascular: RRR, no murmurs / rubs / gallops. S1 and S2 auscultated.  Trace to 1+ lower extremity edema Abdomen: Soft, non-tender, distended  secondary body habitus. . Bowel sounds positive.  GU: Deferred. Musculoskeletal: No clubbing / cyanosis of digits/nails. No joint deformity upper and lower extremities.  Has a right TDC in place where he is getting dialysis from Skin: No rashes, lesions, ulcers on limited skin evaluation. No induration; Warm and dry.  Neurologic: CN 2-12 grossly intact with no focal deficits. Romberg sign and cerebellar reflexes not assessed.  Psychiatric: Normal judgment and insight. Alert and oriented x 3. Normal mood and appropriate affect.   Data Reviewed: I have personally reviewed following labs and imaging studies  CBC: Recent Labs  Lab 04/17/21 1026 04/19/21 0925  WBC 7.2 7.9  NEUTROABS  --  5.4  HGB 8.3* 7.6*  HCT 25.1* 22.9*  MCV 94.4 94.6  PLT 256 458   Basic Metabolic Panel: Recent Labs   Lab 04/17/21 1026 04/18/21 0538 04/19/21 0925  NA 133* 134* 133*  K 4.0 4.6 5.0  CL 95* 96* 93*  CO2 25 27 25   GLUCOSE 81 87 108*  BUN 43* 37* 56*  CREATININE 10.12* 9.53* 12.99*  CALCIUM 8.9 8.7* 9.0  MG  --   --  1.9  PHOS  --   --  7.5*   GFR: Estimated Creatinine Clearance: 8.1 mL/min (A) (by C-G formula based on SCr of 12.99 mg/dL (H)). Liver Function Tests: Recent Labs  Lab 04/19/21 0925  AST 11*  ALT 7  ALKPHOS 55  BILITOT 0.5  PROT 8.0  ALBUMIN 3.3*   No results for input(s): LIPASE, AMYLASE in the last 168 hours. No results for input(s): AMMONIA in the last 168 hours. Coagulation Profile: No results for input(s): INR, PROTIME in the last 168 hours. Cardiac Enzymes: No results for input(s): CKTOTAL, CKMB, CKMBINDEX, TROPONINI in the last 168 hours. BNP (last 3 results) No results for input(s): PROBNP in the last 8760 hours. HbA1C: No results for input(s): HGBA1C in the last 72 hours. CBG: No results for input(s): GLUCAP in the last 168 hours. Lipid Profile: No results for input(s): CHOL, HDL, LDLCALC, TRIG, CHOLHDL, LDLDIRECT in the last 72 hours. Thyroid Function Tests: No results for input(s): TSH, T4TOTAL, FREET4, T3FREE, THYROIDAB in the last 72 hours. Anemia Panel: No results for input(s): VITAMINB12, FOLATE, FERRITIN, TIBC, IRON, RETICCTPCT in the last 72 hours. Sepsis Labs: No results for input(s): PROCALCITON, LATICACIDVEN in the last 168 hours.  Recent Results (from the past 240 hour(s))  Resp Panel by RT-PCR (Flu A&B, Covid) Nasopharyngeal Swab     Status: None   Collection Time: 04/17/21  1:00 PM   Specimen: Nasopharyngeal Swab; Nasopharyngeal(NP) swabs in vial transport medium  Result Value Ref Range Status   SARS Coronavirus 2 by RT PCR NEGATIVE NEGATIVE Final    Comment: (NOTE) SARS-CoV-2 target nucleic acids are NOT DETECTED.  The SARS-CoV-2 RNA is generally detectable in upper respiratory specimens during the acute phase of  infection. The lowest concentration of SARS-CoV-2 viral copies this assay can detect is 138 copies/mL. A negative result does not preclude SARS-Cov-2 infection and should not be used as the sole basis for treatment or other patient management decisions. A negative result may occur with  improper specimen collection/handling, submission of specimen other than nasopharyngeal swab, presence of viral mutation(s) within the areas targeted by this assay, and inadequate number of viral copies(<138 copies/mL). A negative result must be combined with clinical observations, patient history, and epidemiological information. The expected result is Negative.  Fact Sheet for Patients:  EntrepreneurPulse.com.au  Fact Sheet for Healthcare Providers:  IncredibleEmployment.be  This test is no t yet approved or cleared by the [place] and  has been authorized for detection and/or diagnosis of SARS-CoV-2 by FDA under an Emergency Use Authorization (EUA). This EUA will remain  in effect (meaning this test can be used) for the duration of the COVID-19 declaration under Section 564(b)(1) of the Act, 21 U.S.C.section 360bbb-3(b)(1), unless the authorization is terminated  or revoked sooner.       Influenza A by PCR NEGATIVE NEGATIVE Final   Influenza B by PCR NEGATIVE NEGATIVE Final    Comment: (NOTE) The Xpert Xpress SARS-CoV-2/FLU/RSV plus assay is intended as an aid in the diagnosis of influenza from Nasopharyngeal swab specimens and should not be used as a sole basis for treatment. Nasal washings and aspirates are unacceptable for Xpert Xpress SARS-CoV-2/FLU/RSV testing.  Fact Sheet for Patients: EntrepreneurPulse.com.au  Fact Sheet for Healthcare Providers: IncredibleEmployment.be  This test is not yet approved or cleared by the [place] FDA and has been authorized for detection and/or diagnosis of SARS-CoV-2  by FDA under an Emergency Use Authorization (EUA). This EUA will remain in effect (meaning this test can be used) for the duration of the COVID-19 declaration under Section 564(b)(1) of the Act, 21 U.S.C. section 360bbb-3(b)(1), unless the authorization is terminated or revoked.  Performed at [facility], [address] [place], [place] [address]      RN Pressure Injury Documentation:     Estimated body mass index is 26.34 kg/m as calculated from the following:   Height as of this encounter: 5\' 9"  (1.753 m).   Weight as of this encounter: 80.9 kg.  Malnutrition Type:   Malnutrition Characteristics:   Nutrition Interventions:    Radiology Studies: DG Chest 2 View  Result Date: 04/18/2021 CLINICAL DATA:  Shortness of breath.  End-stage renal disease. EXAM: CHEST - 2 VIEW COMPARISON:  04/17/2021 FINDINGS: No critical change since yesterday. Central line tip in the SVC above the right atrium. Bilateral pleural fluid collections, larger on the left than the right. Multiple air-fluid levels on the left appears similar. Associated pulmonary atelectasis/infiltrate appears the same. IMPRESSION: No change since yesterday. Bilateral effusions, larger on the left than the right, with multiple air-fluid levels on the left. Associated pulmonary volume loss/infiltrate. Electronically Signed   By: Nelson Chimes M.D.   On: 04/18/2021 10:21   PERIPHERAL VASCULAR CATHETERIZATION  Result Date: 04/18/2021 See surgical note for result.   Scheduled Meds:  amLODipine  10 mg Oral Daily   aspirin  325 mg Oral Daily   calcium acetate  1,334 mg Oral TID WC   carvedilol  25 mg Oral BID WC   Chlorhexidine Gluconate Cloth  6 each Topical Q0600   heparin  5,000 Units Subcutaneous Q8H   heparin sodium (porcine)       hydrALAZINE  100 mg Oral Q8H   minoxidil  5 mg Oral Daily   pantoprazole  40 mg Oral Daily   sodium chloride flush  3 mL Intravenous Q12H   Continuous Infusions:  sodium  chloride     sodium chloride     sodium chloride      ceFAZolin (ANCEF) IV      LOS: 1 day   Kerney Elbe, DO [facility] PAGER is on AMION  If 7PM-7AM, please contact night-coverage [email or web address]

## 2021-04-19 NOTE — Progress Notes (Signed)
[facility]  Daily Progress Note   Subjective  - 1 Day Post-Op  Patient had concerns regarding bleeding from his catheter.  No bleeding today dialysis went well and the catheter ran with good flow volumes  Objective Vitals:   04/19/21 1300 04/19/21 1315 04/19/21 1330 04/19/21 1434  BP: (!) 118/59 124/62 116/65 126/73  Pulse: 87 84 85 (!) 57  Resp: (!) 30 17 (!) 32   Temp:    98.4 F (36.9 C)  TempSrc:    Oral  SpO2: 100% 100% 99% 99%  Weight:      Height:       No intake or output data in the 24 hours ending 04/19/21 1505  PULM  Normal effort , no use of accessory muscles CV  No JVD, RRR Abd      No distended, nontender VASC  catheter is clean dry and intact dressing is without any evidence for bleeding  Laboratory CBC    Component Value Date/Time   WBC 7.9 04/19/2021 0925   HGB 7.6 (L) 04/19/2021 0925   HCT 22.9 (L) 04/19/2021 0925   PLT 247 04/19/2021 0925    BMET    Component Value Date/Time   NA 133 (L) 04/19/2021 0925   K 5.0 04/19/2021 0925   CL 93 (L) 04/19/2021 0925   CO2 25 04/19/2021 0925   GLUCOSE 108 (H) 04/19/2021 0925   BUN 56 (H) 04/19/2021 0925   CREATININE 12.99 (H) 04/19/2021 0925   CALCIUM 9.0 04/19/2021 0925   GFRNONAA 5 (L) 04/19/2021 0925    Assessment/Planning: POD #1 s/p tunneled catheter exchange  I reviewed the plan with the patient.  The neck step is to perform a left wrist fistulogram to ensure that the outflow from the proximal forearm centrally is widely patent.  Once this is identified I will move forward with revision of his fistula in the operating room.  Patient acknowledged understanding.   Hortencia Pilar  04/19/2021, 3:05 PM

## 2021-04-19 NOTE — Consult Note (Signed)
PULMONOLOGY         Date: 04/19/2021,   MRN# [id number] Travis Long Oct 10, 1985     AdmissionWeight: 79.4 kg                 CurrentWeight: 82.9 kg   Referring physician: Dr Laurell Josephs   CHIEF COMPLAINT:   Recurrent pleural effusion    HISTORY OF PRESENT ILLNESS   This is a 36 yo male with hx of recurrent pleural effusion with hx of ESRD on HD MWF s/p R IJ HD access placed. PCCM consulted to evaluate recurrent pleural left pleural effusion.   I reviewed CT chest with findings of bronchiectatic lungs and overlying moderate pleural effusions bilaterally with ex-vacuo physiology on right hemithorax due to rounded atelectasis and what appears to be lung entrapment.  He had thoracic surgery evaluation at [facility] and it was felt by surgery that it would be more harm to patient if he was to undergo decortication and invasive thoracoscopic management.He should be able to recruit more lung function via diuresis or more aggressive HD due to findings of bilateral effusions with compressive atelectasis.    PAST MEDICAL HISTORY   Past Medical History:  Diagnosis Date   ESRD on hemodialysis [facility])    Hypertension      SURGICAL HISTORY   Past Surgical History:  Procedure Laterality Date   A/V FISTULAGRAM Left 03/09/2021   Procedure: A/V Fistulagram;  Surgeon: Algernon Huxley, MD;  Location: [place] [facility];  Service: Cardiovascular;  Laterality: Left;   AV FISTULA PLACEMENT Left    DIALYSIS/PERMA CATHETER INSERTION N/A 04/18/2021   Procedure: DIALYSIS/PERMA CATHETER INSERTION;  Surgeon: Katha Cabal, MD;  Location: [place] [facility];  Service: Cardiovascular;  Laterality: N/A;   IR CATHETER TUBE CHANGE  03/09/2021     FAMILY HISTORY   Family History  Problem Relation Age of Onset   Hypertension Mother    Diabetes Mellitus II Mother      SOCIAL HISTORY   Social History   Tobacco Use   Smoking status: Former    Types: Cigarettes   Smokeless  tobacco: Never  Substance Use Topics   Alcohol use: Not Currently   Drug use: Never     MEDICATIONS    Home Medication:    Current Medication:  Current Facility-Administered Medications:    0.9 %  sodium chloride infusion, 100 mL, Intravenous, PRN, Schnier, [name], MD   0.9 %  sodium chloride infusion, 100 mL, Intravenous, PRN, Schnier, [name], MD   0.9 %  sodium chloride infusion, 250 mL, Intravenous, PRN, Schnier, [name], MD   alteplase (CATHFLO ACTIVASE) injection 2 mg, 2 mg, Intracatheter, Once PRN, Schnier, [name], MD   amLODipine (NORVASC) tablet 10 mg, 10 mg, Oral, Daily, Schnier, [name], MD, 10 mg at 04/19/21 0910   aspirin tablet 325 mg, 325 mg, Oral, Daily, Schnier, [name], MD, 325 mg at 04/19/21 0909   calcium acetate (PHOSLO) capsule 1,334 mg, 1,334 mg, Oral, TID WC, Schnier, [name], MD, 1,334 mg at 04/19/21 0910   carvedilol (COREG) tablet 25 mg, 25 mg, Oral, BID WC, Schnier, [name], MD, 25 mg at 04/19/21 0910   ceFAZolin (ANCEF) IVPB 2g/100 mL premix, 2 g, Intravenous, On Call to OR, Schnier, [name], MD   Chlorhexidine Gluconate Cloth 2 % PADS 6 each, 6 each, Topical, Q0600, Schnier, [name], MD   heparin injection 1,000 Units, 1,000 Units, Dialysis, PRN, Schnier, Belenda Cruise  G, MD, 10,000 Units at 04/19/21 1341   heparin injection 5,000 Units, 5,000 Units, Subcutaneous, Q8H, Schnier, [name], MD, 5,000 Units at 04/18/21 2308   heparin sodium (porcine) 1000 UNIT/ML injection, , , ,    hydrALAZINE (APRESOLINE) tablet 100 mg, 100 mg, Oral, Q8H, Schnier, [name], MD, 100 mg at 04/19/21 1432   HYDROcodone-acetaminophen (NORCO/VICODIN) 5-325 MG per tablet 1 tablet, 1 tablet, Oral, Q6H PRN, Max Sane, MD, 1 tablet at 04/19/21 0549   lidocaine (PF) (XYLOCAINE) 1 % injection 5 mL, 5 mL, Intradermal, PRN, Schnier, [name], MD   lidocaine-prilocaine (EMLA) cream 1 application, 1 application, Topical, PRN, Schnier, [name], MD   minoxidil  (LONITEN) tablet 5 mg, 5 mg, Oral, Daily, Schnier, [name], MD, 5 mg at 04/19/21 0910   pantoprazole (PROTONIX) EC tablet 40 mg, 40 mg, Oral, Daily, Schnier, [name], MD   pentafluoroprop-tetrafluoroeth (GEBAUERS) aerosol 1 application, 1 application, Topical, PRN, Schnier, [name], MD   sodium chloride flush (NS) 0.9 % injection 3 mL, 3 mL, Intravenous, Q12H, Schnier, [name], MD, 3 mL at 04/18/21 2309   sodium chloride flush (NS) 0.9 % injection 3 mL, 3 mL, Intravenous, PRN, Schnier, [name], MD    ALLERGIES   Patient has no known allergies.     REVIEW OF SYSTEMS    Review of Systems:  Gen:  Denies  fever, sweats, chills weigh loss  HEENT: Denies blurred vision, double vision, ear pain, eye pain, hearing loss, nose bleeds, sore throat Cardiac:  No dizziness, chest pain or heaviness, chest tightness,edema Resp:   Denies cough or sputum porduction, shortness of breath,wheezing, hemoptysis,  Gi: Denies swallowing difficulty, stomach pain, nausea or vomiting, diarrhea, constipation, bowel incontinence Gu:  Denies bladder incontinence, burning urine Ext:   Denies Joint pain, stiffness or swelling Skin: Denies  skin rash, easy bruising or bleeding or hives Endoc:  Denies polyuria, polydipsia , polyphagia or weight change Psych:   Denies depression, insomnia or hallucinations   Other:  All other systems negative   VS: BP 126/73 (BP Location: Right Arm)    Pulse (!) 57    Temp 98.4 F (36.9 C) (Oral)    Resp (!) 32 Comment: Simultaneous filing. User may not have seen previous data.   Ht 5\' 9"  (1.753 m)    Wt 82.9 kg    SpO2 99%    BMI 26.99 kg/m      PHYSICAL EXAM    GENERAL:NAD, no fevers, chills, no weakness no fatigue HEAD: Normocephalic, atraumatic.  EYES: Pupils equal, round, reactive to light. Extraocular muscles intact. No scleral icterus.  MOUTH: Moist mucosal membrane. Dentition intact. No abscess noted.  EAR, NOSE, THROAT: Clear without exudates. No  external lesions.  NECK: Supple. No thyromegaly. No nodules. No JVD.  PULMONARY: Diffuse coarse rhonchi right sided +wheezes CARDIOVASCULAR: S1 and S2. Regular rate and rhythm. No murmurs, rubs, or gallops. No edema. Pedal pulses 2+ bilaterally.  GASTROINTESTINAL: Soft, nontender, nondistended. No masses. Positive bowel sounds. No hepatosplenomegaly.  MUSCULOSKELETAL: No swelling, clubbing, or edema. Range of motion full in all extremities.  NEUROLOGIC: Cranial nerves II through XII are intact. No gross focal neurological deficits. Sensation intact. Reflexes intact.  SKIN: No ulceration, lesions, rashes, or cyanosis. Skin warm and dry. Turgor intact.  PSYCHIATRIC: Mood, affect within normal limits. The patient is awake, alert and oriented x 3. Insight, judgment intact.       IMAGING    DG Chest 2 View  Result Date: 04/18/2021  CLINICAL DATA:  Shortness of breath.  End-stage renal disease. EXAM: CHEST - 2 VIEW COMPARISON:  04/17/2021 FINDINGS: No critical change since yesterday. Central line tip in the SVC above the right atrium. Bilateral pleural fluid collections, larger on the left than the right. Multiple air-fluid levels on the left appears similar. Associated pulmonary atelectasis/infiltrate appears the same. IMPRESSION: No change since yesterday. Bilateral effusions, larger on the left than the right, with multiple air-fluid levels on the left. Associated pulmonary volume loss/infiltrate. Electronically Signed   By: Nelson Chimes M.D.   On: 04/18/2021 10:21   DG Chest 2 View  Result Date: 04/17/2021 CLINICAL DATA:  Shortness of breath.  Dialysis. EXAM: CHEST - 2 VIEW COMPARISON:  March 15, 2021 FINDINGS: Dual lumen dialysis catheter with tip projecting over the superior cavoatrial junction. The heart size and mediastinal contours are obscured. Large right pleural effusion with atelectasis versus infiltrate involving the left lung. Two focal gas fluid levels in the left hemithorax 1 of  the mid lung level the other at the apex may reflect focal areas of aerated lung but abscess not excluded consider further evaluation with chest CT. Small right pleural effusion. Bilateral pulmonary edema. The visualized skeletal structures are unchanged. IMPRESSION: Large left pleural effusion with a combination of edema and atelectasis versus infiltrate in the left lung, additionally there are 2 focal gas fluid-levels in the left hemithorax 1 of the mid lung level the other in the apex which may reflect focal areas of aerated lung but abscess not excluded, consider further evaluation with chest CT. Small right pleural effusion with right lung atelectasis and edema. Electronically Signed   By: Dahlia Bailiff M.D.   On: 04/17/2021 11:19   CT Chest Wo Contrast  Result Date: 04/17/2021 CLINICAL DATA:  Pneumonia. Complication suspected. Abnormal chest radiograph. After 3 hours of dialysis treatment today common dialysis machine with stopping was told he needed dialysis access changed out. Hypertension. Patient has not felt well for 1 week. EXAM: CT CHEST WITHOUT CONTRAST TECHNIQUE: Multidetector CT imaging of the chest was performed following the standard protocol without IV contrast. RADIATION DOSE REDUCTION: This exam was performed according to the departmental dose-optimization program which includes automated exposure control, adjustment of the mA and/or kV according to patient size and/or use of iterative reconstruction technique. COMPARISON:  Chest radiographs 04/17/2021, AP chest 03/15/2021, 03/09/2021, 09/16/2020; CT chest 03/06/2021 FINDINGS: Right internal jugular dual-lumen central venous catheter with tips within the central superior vena cava. Cardiovascular: Heart size is again mildly enlarged. Trace pericardial fluid is slightly increased from prior. No thoracic aortic aneurysm. Mediastinum/Nodes: No pathologically enlarged axillary lymph nodes by CT criteria. There are again multiple mildly enlarged  mediastinal lymph nodes. The largest is again seen within the inferior right paratracheal region measuring up to 1.3 cm in short axis, not significantly changed from prior when measured in a similar manner. Again these are likely reactive. No definite hilar lymphadenopathy is seen, within the limitations of lack of IV contrast.The thyroid gland is grossly unremarkable. The esophagus follows a normal course of normal caliber. Lungs/Pleura: There is again opacification of the distal segmental left lower lobe airways. There is again high-grade consolidation within the posterior left lower lobe. Moderate bilateral pleural effusions are mildly increased from prior. Interval increase in air component of left-sided hydropneumothorax. This includes an air component measuring up to approximately 2.8 cm in craniocaudal dimension on the current CT, increased from 2.1 cm on 03/06/2021 CT (as measured on sagittal series 6, image 122).  There is also interval increase in air within the more inferior posterolateral hydropneumothorax component on this supine CT with air newly seen within the non dependent regions of the left mid and lower lung (axial series 3, images 84 through 99). Previously a small amount of air was seen within the posteromedial left lower lobe pleural space, and this is still mild but slightly increased from prior (axial series 3 images 63 through 84). Overall there is interval increase in fluid and air components of this left-sided hydropneumothorax. There again mild right peripheral paraseptal cystic emphysematous changes. Upper Abdomen: There is partial visualization of the kidneys which demonstrate moderate to high-grade atrophy consistent with known chronic kidney disease. Moderate to high-grade bilateral gynecomastia is again noted. Musculoskeletal: There is diffuse increased bone density again seen, consistent with chronic renal failure. IMPRESSION:: IMPRESSION: 1. Compared to 03/06/2021, there is  interval increase in the fluid and air components of a moderate left hydropneumothorax. Interval mild increase in moderate bilateral pleural effusions. There again appear to be multiple loculations within the hydropneumothorax, also increased from prior. 2. There is again high-grade consolidation of the posterior left lower lobe either pneumonia versus round atelectasis. 3. Note is made of prior 03/09/2021 basilar pleural pigtail catheter placement in the region of this hydropneumothorax on prior 03/09/2021 radiographs. Following removal of this pigtail catheter, the extrapleural air and fluid appears to have increased over time. Electronically Signed   By: Yvonne Kendall M.D.   On: 04/17/2021 14:17   PERIPHERAL VASCULAR CATHETERIZATION  Result Date: 04/18/2021 See surgical note for result.     ASSESSMENT/PLAN   Bilateral pleural effusions worse on left with lung entrapment         - patient is not on oxygen therapy          - he had thoracic surgery evaluation and is not candidate for surgery          -he improved with HD          -patient has been referred to [facility] for second opinion    ESRD on HD  - patient does have fluid overload and would benefit from aggressive removal of fluid to help with pleural effusions   Major depression    - patient lost job and car and this is due to medical disease, recommend outpatient follow up with psychiatry       Thank you for allowing me to participate in the care of this patient.    Patient/Family are satisfied with care plan and all questions have been answered.  This document was prepared using Dragon voice recognition software and may include unintentional dictation errors.     Ottie Glazier, M.D.  Division of [place]

## 2021-04-19 NOTE — Discharge Summary (Incomplete)
Physician Discharge Summary  Travis Long IRJ:[id number] DOB: 03-19-1986 DOA: 04/17/2021  PCP: Pcp, No  Admit date: 04/17/2021 Discharge date: 04/19/2021  Admitted From: *** Disposition:  ***  Recommendations for Outpatient Follow-up:  Follow up with PCP in 1-2 weeks Please obtain BMP/CBC in one week Please follow up on the following pending results:  Home Health:***  Equipment/Devices:***    Discharge Condition:***  CODE STATUS:***  Diet recommendation:   Brief/Interim Summary: ***  Discharge Diagnoses:  Principal Problem:   Volume overload Active Problems:   ESRD on hemodialysis ([place])   Hypertension   Anemia in ESRD (end-stage renal disease) ([place])   Pleural effusion   Hypertensive urgency    Discharge Instructions   Allergies as of 04/19/2021   No Known Allergies   Med Rec must be completed prior to using this [place]***       No Known Allergies  Consultations: ***   Procedures/Studies: DG Chest 2 View  Result Date: 04/18/2021 CLINICAL DATA:  Shortness of breath.  End-stage renal disease. EXAM: CHEST - 2 VIEW COMPARISON:  04/17/2021 FINDINGS: No critical change since yesterday. Central line tip in the SVC above the right atrium. Bilateral pleural fluid collections, larger on the left than the right. Multiple air-fluid levels on the left appears similar. Associated pulmonary atelectasis/infiltrate appears the same. IMPRESSION: No change since yesterday. Bilateral effusions, larger on the left than the right, with multiple air-fluid levels on the left. Associated pulmonary volume loss/infiltrate. Electronically Signed   By: Nelson Chimes M.D.   On: 04/18/2021 10:21   DG Chest 2 View  Result Date: 04/17/2021 CLINICAL DATA:  Shortness of breath.  Dialysis. EXAM: CHEST - 2 VIEW COMPARISON:  March 15, 2021 FINDINGS: Dual lumen dialysis catheter with tip projecting over the superior cavoatrial junction. The heart size and mediastinal contours are obscured.  Large right pleural effusion with atelectasis versus infiltrate involving the left lung. Two focal gas fluid levels in the left hemithorax 1 of the mid lung level the other at the apex may reflect focal areas of aerated lung but abscess not excluded consider further evaluation with chest CT. Small right pleural effusion. Bilateral pulmonary edema. The visualized skeletal structures are unchanged. IMPRESSION: Large left pleural effusion with a combination of edema and atelectasis versus infiltrate in the left lung, additionally there are 2 focal gas fluid-levels in the left hemithorax 1 of the mid lung level the other in the apex which may reflect focal areas of aerated lung but abscess not excluded, consider further evaluation with chest CT. Small right pleural effusion with right lung atelectasis and edema. Electronically Signed   By: Dahlia Bailiff M.D.   On: 04/17/2021 11:19   CT Chest Wo Contrast  Result Date: 04/17/2021 CLINICAL DATA:  Pneumonia. Complication suspected. Abnormal chest radiograph. After 3 hours of dialysis treatment today common dialysis machine with stopping was told he needed dialysis access changed out. Hypertension. Patient has not felt well for 1 week. EXAM: CT CHEST WITHOUT CONTRAST TECHNIQUE: Multidetector CT imaging of the chest was performed following the standard protocol without IV contrast. RADIATION DOSE REDUCTION: This exam was performed according to the departmental dose-optimization program which includes automated exposure control, adjustment of the mA and/or kV according to patient size and/or use of iterative reconstruction technique. COMPARISON:  Chest radiographs 04/17/2021, AP chest 03/15/2021, 03/09/2021, 09/16/2020; CT chest 03/06/2021 FINDINGS: Right internal jugular dual-lumen central venous catheter with tips within the central superior vena cava. Cardiovascular: Heart size is again mildly enlarged. Trace pericardial fluid  is slightly increased from prior. No  thoracic aortic aneurysm. Mediastinum/Nodes: No pathologically enlarged axillary lymph nodes by CT criteria. There are again multiple mildly enlarged mediastinal lymph nodes. The largest is again seen within the inferior right paratracheal region measuring up to 1.3 cm in short axis, not significantly changed from prior when measured in a similar manner. Again these are likely reactive. No definite hilar lymphadenopathy is seen, within the limitations of lack of IV contrast.The thyroid gland is grossly unremarkable. The esophagus follows a normal course of normal caliber. Lungs/Pleura: There is again opacification of the distal segmental left lower lobe airways. There is again high-grade consolidation within the posterior left lower lobe. Moderate bilateral pleural effusions are mildly increased from prior. Interval increase in air component of left-sided hydropneumothorax. This includes an air component measuring up to approximately 2.8 cm in craniocaudal dimension on the current CT, increased from 2.1 cm on 03/06/2021 CT (as measured on sagittal series 6, image 122). There is also interval increase in air within the more inferior posterolateral hydropneumothorax component on this supine CT with air newly seen within the non dependent regions of the left mid and lower lung (axial series 3, images 84 through 99). Previously a small amount of air was seen within the posteromedial left lower lobe pleural space, and this is still mild but slightly increased from prior (axial series 3 images 63 through 84). Overall there is interval increase in fluid and air components of this left-sided hydropneumothorax. There again mild right peripheral paraseptal cystic emphysematous changes. Upper Abdomen: There is partial visualization of the kidneys which demonstrate moderate to high-grade atrophy consistent with known chronic kidney disease. Moderate to high-grade bilateral gynecomastia is again noted. Musculoskeletal: There is  diffuse increased bone density again seen, consistent with chronic renal failure. IMPRESSION:: IMPRESSION: 1. Compared to 03/06/2021, there is interval increase in the fluid and air components of a moderate left hydropneumothorax. Interval mild increase in moderate bilateral pleural effusions. There again appear to be multiple loculations within the hydropneumothorax, also increased from prior. 2. There is again high-grade consolidation of the posterior left lower lobe either pneumonia versus round atelectasis. 3. Note is made of prior 03/09/2021 basilar pleural pigtail catheter placement in the region of this hydropneumothorax on prior 03/09/2021 radiographs. Following removal of this pigtail catheter, the extrapleural air and fluid appears to have increased over time. Electronically Signed   By: Yvonne Kendall M.D.   On: 04/17/2021 14:17   PERIPHERAL VASCULAR CATHETERIZATION  Result Date: 04/18/2021 See surgical note for result.    Subjective:   Discharge Exam: Vitals:   04/19/21 0551 04/19/21 0748  BP: (!) 164/103 (!) 175/99  Pulse: 94 95  Resp: 16 17  Temp: 98 F (36.7 C) 98.5 F (36.9 C)  SpO2: 94% 96%   Vitals:   04/18/21 2034 04/19/21 0026 04/19/21 0551 04/19/21 0748  BP: 131/79 (!) 146/78 (!) 164/103 (!) 175/99  Pulse: 87 87 94 95  Resp: 16 16 16 17   Temp: 98.4 F (36.9 C) 98.2 F (36.8 C) 98 F (36.7 C) 98.5 F (36.9 C)  TempSrc: Oral Oral Oral Oral  SpO2: 98% 97% 94% 96%  Weight:      Height:        General: Pt is alert, awake, not in acute distress Cardiovascular: RRR, S1/S2 +, no rubs, no gallops Respiratory: CTA bilaterally, no wheezing, no rhonchi Abdominal: Soft, NT, ND, bowel sounds + Extremities: no edema, no cyanosis    The results of significant diagnostics  from this hospitalization (including imaging, microbiology, ancillary and laboratory) are listed below for reference.     Microbiology: Recent Results (from the past 240 hour(s))  Resp Panel by  RT-PCR (Flu A&B, Covid) Nasopharyngeal Swab     Status: None   Collection Time: 04/17/21  1:00 PM   Specimen: Nasopharyngeal Swab; Nasopharyngeal(NP) swabs in vial transport medium  Result Value Ref Range Status   SARS Coronavirus 2 by RT PCR NEGATIVE NEGATIVE Final    Comment: (NOTE) SARS-CoV-2 target nucleic acids are NOT DETECTED.  The SARS-CoV-2 RNA is generally detectable in upper respiratory specimens during the acute phase of infection. The lowest concentration of SARS-CoV-2 viral copies this assay can detect is 138 copies/mL. A negative result does not preclude SARS-Cov-2 infection and should not be used as the sole basis for treatment or other patient management decisions. A negative result may occur with  improper specimen collection/handling, submission of specimen other than nasopharyngeal swab, presence of viral mutation(s) within the areas targeted by this assay, and inadequate number of viral copies(<138 copies/mL). A negative result must be combined with clinical observations, patient history, and epidemiological information. The expected result is Negative.  Fact Sheet for Patients:  EntrepreneurPulse.com.au  Fact Sheet for Healthcare Providers:  IncredibleEmployment.be  This test is no t yet approved or cleared by the [place] FDA and  has been authorized for detection and/or diagnosis of SARS-CoV-2 by FDA under an Emergency Use Authorization (EUA). This EUA will remain  in effect (meaning this test can be used) for the duration of the COVID-19 declaration under Section 564(b)(1) of the Act, 21 U.S.C.section 360bbb-3(b)(1), unless the authorization is terminated  or revoked sooner.       Influenza A by PCR NEGATIVE NEGATIVE Final   Influenza B by PCR NEGATIVE NEGATIVE Final    Comment: (NOTE) The Xpert Xpress SARS-CoV-2/FLU/RSV plus assay is intended as an aid in the diagnosis of influenza from Nasopharyngeal swab  specimens and should not be used as a sole basis for treatment. Nasal washings and aspirates are unacceptable for Xpert Xpress SARS-CoV-2/FLU/RSV testing.  Fact Sheet for Patients: EntrepreneurPulse.com.au  Fact Sheet for Healthcare Providers: IncredibleEmployment.be  This test is not yet approved or cleared by the [place] FDA and has been authorized for detection and/or diagnosis of SARS-CoV-2 by FDA under an Emergency Use Authorization (EUA). This EUA will remain in effect (meaning this test can be used) for the duration of the COVID-19 declaration under Section 564(b)(1) of the Act, 21 U.S.C. section 360bbb-3(b)(1), unless the authorization is terminated or revoked.  Performed at [facility], [address] [place], [place] [address]      Labs: BNP (last 3 results) Recent Labs    09/16/20 0609 03/06/21 0643  BNP >4,500.0* 7,017.7*   Basic Metabolic Panel: Recent Labs  Lab 04/17/21 1026 04/18/21 0538  NA 133* 134*  K 4.0 4.6  CL 95* 96*  CO2 25 27  GLUCOSE 81 87  BUN 43* 37*  CREATININE 10.12* 9.53*  CALCIUM 8.9 8.7*   Liver Function Tests: No results for input(s): AST, ALT, ALKPHOS, BILITOT, PROT, ALBUMIN in the last 168 hours. No results for input(s): LIPASE, AMYLASE in the last 168 hours. No results for input(s): AMMONIA in the last 168 hours. CBC: Recent Labs  Lab 04/17/21 1026  WBC 7.2  HGB 8.3*  HCT 25.1*  MCV 94.4  PLT 256   Cardiac Enzymes: No results for input(s): CKTOTAL, CKMB, CKMBINDEX, TROPONINI in the last 168 hours. BNP:  Invalid input(s): POCBNP CBG: No results for input(s): GLUCAP in the last 168 hours. D-Dimer No results for input(s): DDIMER in the last 72 hours. Hgb A1c No results for input(s): HGBA1C in the last 72 hours. Lipid Profile No results for input(s): CHOL, HDL, LDLCALC, TRIG, CHOLHDL, LDLDIRECT in the last 72 hours. Thyroid function studies No results for  input(s): TSH, T4TOTAL, T3FREE, THYROIDAB in the last 72 hours.  Invalid input(s): FREET3 Anemia work up No results for input(s): VITAMINB12, FOLATE, FERRITIN, TIBC, IRON, RETICCTPCT in the last 72 hours. Urinalysis No results found for: COLORURINE, APPEARANCEUR, [place], [place], GLUCOSEU, [place], [place], [place], PROTEINUR, UROBILINOGEN, NITRITE, LEUKOCYTESUR Sepsis Labs Invalid input(s): PROCALCITONIN,  WBC,  LACTICIDVEN Microbiology Recent Results (from the past 240 hour(s))  Resp Panel by RT-PCR (Flu A&B, Covid) Nasopharyngeal Swab     Status: None   Collection Time: 04/17/21  1:00 PM   Specimen: Nasopharyngeal Swab; Nasopharyngeal(NP) swabs in vial transport medium  Result Value Ref Range Status   SARS Coronavirus 2 by RT PCR NEGATIVE NEGATIVE Final    Comment: (NOTE) SARS-CoV-2 target nucleic acids are NOT DETECTED.  The SARS-CoV-2 RNA is generally detectable in upper respiratory specimens during the acute phase of infection. The lowest concentration of SARS-CoV-2 viral copies this assay can detect is 138 copies/mL. A negative result does not preclude SARS-Cov-2 infection and should not be used as the sole basis for treatment or other patient management decisions. A negative result may occur with  improper specimen collection/handling, submission of specimen other than nasopharyngeal swab, presence of viral mutation(s) within the areas targeted by this assay, and inadequate number of viral copies(<138 copies/mL). A negative result must be combined with clinical observations, patient history, and epidemiological information. The expected result is Negative.  Fact Sheet for Patients:  EntrepreneurPulse.com.au  Fact Sheet for Healthcare Providers:  IncredibleEmployment.be  This test is no t yet approved or cleared by the [place] FDA and  has been authorized for detection and/or diagnosis of SARS-CoV-2 by FDA under an Emergency  Use Authorization (EUA). This EUA will remain  in effect (meaning this test can be used) for the duration of the COVID-19 declaration under Section 564(b)(1) of the Act, 21 U.S.C.section 360bbb-3(b)(1), unless the authorization is terminated  or revoked sooner.       Influenza A by PCR NEGATIVE NEGATIVE Final   Influenza B by PCR NEGATIVE NEGATIVE Final    Comment: (NOTE) The Xpert Xpress SARS-CoV-2/FLU/RSV plus assay is intended as an aid in the diagnosis of influenza from Nasopharyngeal swab specimens and should not be used as a sole basis for treatment. Nasal washings and aspirates are unacceptable for Xpert Xpress SARS-CoV-2/FLU/RSV testing.  Fact Sheet for Patients: EntrepreneurPulse.com.au  Fact Sheet for Healthcare Providers: IncredibleEmployment.be  This test is not yet approved or cleared by the [place] FDA and has been authorized for detection and/or diagnosis of SARS-CoV-2 by FDA under an Emergency Use Authorization (EUA). This EUA will remain in effect (meaning this test can be used) for the duration of the COVID-19 declaration under Section 564(b)(1) of the Act, 21 U.S.C. section 360bbb-3(b)(1), unless the authorization is terminated or revoked.  Performed at [facility], [address], [place], [place] [address]    Time coordinating discharge: 35 minutes  SIGNED:  Kerney Elbe, DO [facility] 04/19/2021, 8:22 AM Pager is on [place]  If 7PM-7AM, please contact night-coverage [email or web address]

## 2021-04-19 NOTE — Progress Notes (Signed)
Patient tolerates 3-hour treatment without incident, newly placed CVC functions well, no issue maintaining prescribed BFR. Targeted UF of 2-liters met, outpatient dry weight 80 kg.  Patient declines to have CVC limbs wrapped, states wrapping too bulky. No med's nor labs drawn this treatment, patient returned to assigned room.

## 2021-04-19 NOTE — Progress Notes (Signed)
[facility]  ROUNDING NOTE   Subjective:   Travis Long is a 36 year old African-American male with past medical concerns including hypertension and end-stage renal disease on dialysis.  Patient presents to the emergency department from his outpatient dialysis center with complaints of shortness of breath.  Patient will be admitted under observation for Acute pulmonary edema ([place]) [J81.0] Volume overload [E87.70]  Patient is known to our clinic and receives outpatient dialysis treatments at [facility], supervised by Dr. Holley Raring.  Patient receives dialysis on a MWF schedule.    Patient seen and evaluated during dialysis   HEMODIALYSIS FLOWSHEET:  Blood Flow Rate (mL/min): 400 mL/min Arterial Pressure (mmHg): -140 mmHg Venous Pressure (mmHg): 140 mmHg Transmembrane Pressure (mmHg): 70 mmHg Ultrafiltration Rate (mL/min): 830 mL/min Dialysate Flow Rate (mL/min): 500 ml/min Conductivity: Machine : 13.8 Conductivity: Machine : 13.8 Dialysis Fluid Bolus: Normal Saline Bolus Amount (mL): 100 mL (flushed 171ml to check for impending clot)  Complains of soreness from right chest Tolerating treatment well   Objective:  Vital signs in last 24 hours:  Temp:  [98 F (36.7 C)-98.5 F (36.9 C)] 98.5 F (36.9 C) (01/25 0748) Pulse Rate:  [78-95] 88 (01/25 1245) Resp:  [9-31] 21 (01/25 1245) BP: (131-180)/(70-104) 141/74 (01/25 1230) SpO2:  [94 %-100 %] 94 % (01/25 1245) Weight:  [82.9 kg] 82.9 kg (01/25 1030)  Weight change:  Filed Weights   04/17/21 1521 04/17/21 1845 04/19/21 1030  Weight: 83.8 kg 81 kg 82.9 kg    Intake/Output: No intake/output data recorded.   Intake/Output this shift:  No intake/output data recorded.  Physical Exam: General: NAD, resting in bed  Head: Normocephalic, atraumatic. Moist oral mucosal membranes  Eyes: Anicteric  Lungs:  Clear to auscultation, normal effort, 2 L [place]  Heart: Regular rate and rhythm  Abdomen:  Soft,  nontender  Extremities:  No peripheral edema.  Neurologic: Nonfocal, moving all four extremities  Skin: No lesions  Access: Right IJ PermCath, Lt lower AVF    Basic Metabolic Panel: Recent Labs  Lab 04/17/21 1026 04/18/21 0538 04/19/21 0925  NA 133* 134* 133*  K 4.0 4.6 5.0  CL 95* 96* 93*  CO2 25 27 25   GLUCOSE 81 87 108*  BUN 43* 37* 56*  CREATININE 10.12* 9.53* 12.99*  CALCIUM 8.9 8.7* 9.0  MG  --   --  1.9  PHOS  --   --  7.5*     Liver Function Tests: Recent Labs  Lab 04/19/21 0925  AST 11*  ALT 7  ALKPHOS 55  BILITOT 0.5  PROT 8.0  ALBUMIN 3.3*   No results for input(s): LIPASE, AMYLASE in the last 168 hours. No results for input(s): AMMONIA in the last 168 hours.  CBC: Recent Labs  Lab 04/17/21 1026 04/19/21 0925  WBC 7.2 7.9  NEUTROABS  --  5.4  HGB 8.3* 7.6*  HCT 25.1* 22.9*  MCV 94.4 94.6  PLT 256 247     Cardiac Enzymes: No results for input(s): CKTOTAL, CKMB, CKMBINDEX, TROPONINI in the last 168 hours.  BNP: Invalid input(s): POCBNP  CBG: No results for input(s): GLUCAP in the last 168 hours.  Microbiology: Results for orders placed or performed during the hospital encounter of 04/17/21  Resp Panel by RT-PCR (Flu A&B, Covid) Nasopharyngeal Swab     Status: None   Collection Time: 04/17/21  1:00 PM   Specimen: Nasopharyngeal Swab; Nasopharyngeal(NP) swabs in vial transport medium  Result Value Ref Range Status   SARS Coronavirus 2  by RT PCR NEGATIVE NEGATIVE Final    Comment: (NOTE) SARS-CoV-2 target nucleic acids are NOT DETECTED.  The SARS-CoV-2 RNA is generally detectable in upper respiratory specimens during the acute phase of infection. The lowest concentration of SARS-CoV-2 viral copies this assay can detect is 138 copies/mL. A negative result does not preclude SARS-Cov-2 infection and should not be used as the sole basis for treatment or other patient management decisions. A negative result may occur with  improper  specimen collection/handling, submission of specimen other than nasopharyngeal swab, presence of viral mutation(s) within the areas targeted by this assay, and inadequate number of viral copies(<138 copies/mL). A negative result must be combined with clinical observations, patient history, and epidemiological information. The expected result is Negative.  Fact Sheet for Patients:  EntrepreneurPulse.com.au  Fact Sheet for Healthcare Providers:  IncredibleEmployment.be  This test is no t yet approved or cleared by the [place] FDA and  has been authorized for detection and/or diagnosis of SARS-CoV-2 by FDA under an Emergency Use Authorization (EUA). This EUA will remain  in effect (meaning this test can be used) for the duration of the COVID-19 declaration under Section 564(b)(1) of the Act, 21 U.S.C.section 360bbb-3(b)(1), unless the authorization is terminated  or revoked sooner.       Influenza A by PCR NEGATIVE NEGATIVE Final   Influenza B by PCR NEGATIVE NEGATIVE Final    Comment: (NOTE) The Xpert Xpress SARS-CoV-2/FLU/RSV plus assay is intended as an aid in the diagnosis of influenza from Nasopharyngeal swab specimens and should not be used as a sole basis for treatment. Nasal washings and aspirates are unacceptable for Xpert Xpress SARS-CoV-2/FLU/RSV testing.  Fact Sheet for Patients: EntrepreneurPulse.com.au  Fact Sheet for Healthcare Providers: IncredibleEmployment.be  This test is not yet approved or cleared by the [place] FDA and has been authorized for detection and/or diagnosis of SARS-CoV-2 by FDA under an Emergency Use Authorization (EUA). This EUA will remain in effect (meaning this test can be used) for the duration of the COVID-19 declaration under Section 564(b)(1) of the Act, 21 U.S.C. section 360bbb-3(b)(1), unless the authorization is terminated or revoked.  Performed at  [facility], [address] [place],  [address]     Coagulation Studies: No results for input(s): LABPROT, INR in the last 72 hours.  Urinalysis: No results for input(s): COLORURINE, LABSPEC, PHURINE, GLUCOSEU, HGBUR, BILIRUBINUR, KETONESUR, PROTEINUR, UROBILINOGEN, NITRITE, LEUKOCYTESUR in the last 72 hours.  Invalid input(s): APPERANCEUR    Imaging: DG Chest 2 View  Result Date: 04/18/2021 CLINICAL DATA:  Shortness of breath.  End-stage renal disease. EXAM: CHEST - 2 VIEW COMPARISON:  04/17/2021 FINDINGS: No critical change since yesterday. Central line tip in the SVC above the right atrium. Bilateral pleural fluid collections, larger on the left than the right. Multiple air-fluid levels on the left appears similar. Associated pulmonary atelectasis/infiltrate appears the same. IMPRESSION: No change since yesterday. Bilateral effusions, larger on the left than the right, with multiple air-fluid levels on the left. Associated pulmonary volume loss/infiltrate. Electronically Signed   By: Nelson Chimes M.D.   On: 04/18/2021 10:21   CT Chest Wo Contrast  Result Date: 04/17/2021 CLINICAL DATA:  Pneumonia. Complication suspected. Abnormal chest radiograph. After 3 hours of dialysis treatment today common dialysis machine with stopping was told he needed dialysis access changed out. Hypertension. Patient has not felt well for 1 week. EXAM: CT CHEST WITHOUT CONTRAST TECHNIQUE: Multidetector CT imaging of the chest was performed following the standard protocol without IV  contrast. RADIATION DOSE REDUCTION: This exam was performed according to the departmental dose-optimization program which includes automated exposure control, adjustment of the mA and/or kV according to patient size and/or use of iterative reconstruction technique. COMPARISON:  Chest radiographs 04/17/2021, AP chest 03/15/2021, 03/09/2021, 09/16/2020; CT chest 03/06/2021 FINDINGS: Right internal jugular dual-lumen  central venous catheter with tips within the central superior vena cava. Cardiovascular: Heart size is again mildly enlarged. Trace pericardial fluid is slightly increased from prior. No thoracic aortic aneurysm. Mediastinum/Nodes: No pathologically enlarged axillary lymph nodes by CT criteria. There are again multiple mildly enlarged mediastinal lymph nodes. The largest is again seen within the inferior right paratracheal region measuring up to 1.3 cm in short axis, not significantly changed from prior when measured in a similar manner. Again these are likely reactive. No definite hilar lymphadenopathy is seen, within the limitations of lack of IV contrast.The thyroid gland is grossly unremarkable. The esophagus follows a normal course of normal caliber. Lungs/Pleura: There is again opacification of the distal segmental left lower lobe airways. There is again high-grade consolidation within the posterior left lower lobe. Moderate bilateral pleural effusions are mildly increased from prior. Interval increase in air component of left-sided hydropneumothorax. This includes an air component measuring up to approximately 2.8 cm in craniocaudal dimension on the current CT, increased from 2.1 cm on 03/06/2021 CT (as measured on sagittal series 6, image 122). There is also interval increase in air within the more inferior posterolateral hydropneumothorax component on this supine CT with air newly seen within the non dependent regions of the left mid and lower lung (axial series 3, images 84 through 99). Previously a small amount of air was seen within the posteromedial left lower lobe pleural space, and this is still mild but slightly increased from prior (axial series 3 images 63 through 84). Overall there is interval increase in fluid and air components of this left-sided hydropneumothorax. There again mild right peripheral paraseptal cystic emphysematous changes. Upper Abdomen: There is partial visualization of the  kidneys which demonstrate moderate to high-grade atrophy consistent with known chronic kidney disease. Moderate to high-grade bilateral gynecomastia is again noted. Musculoskeletal: There is diffuse increased bone density again seen, consistent with chronic renal failure. IMPRESSION:: IMPRESSION: 1. Compared to 03/06/2021, there is interval increase in the fluid and air components of a moderate left hydropneumothorax. Interval mild increase in moderate bilateral pleural effusions. There again appear to be multiple loculations within the hydropneumothorax, also increased from prior. 2. There is again high-grade consolidation of the posterior left lower lobe either pneumonia versus round atelectasis. 3. Note is made of prior 03/09/2021 basilar pleural pigtail catheter placement in the region of this hydropneumothorax on prior 03/09/2021 radiographs. Following removal of this pigtail catheter, the extrapleural air and fluid appears to have increased over time. Electronically Signed   By: Yvonne Kendall M.D.   On: 04/17/2021 14:17   PERIPHERAL VASCULAR CATHETERIZATION  Result Date: 04/18/2021 See surgical note for result.    Medications:    sodium chloride     sodium chloride     sodium chloride      ceFAZolin (ANCEF) IV      amLODipine  10 mg Oral Daily   aspirin  325 mg Oral Daily   calcium acetate  1,334 mg Oral TID WC   carvedilol  25 mg Oral BID WC   Chlorhexidine Gluconate Cloth  6 each Topical Q0600   heparin  5,000 Units Subcutaneous Q8H   hydrALAZINE  100 mg Oral  Q8H   minoxidil  5 mg Oral Daily   pantoprazole  40 mg Oral Daily   sodium chloride flush  3 mL Intravenous Q12H     Assessment/ Plan:  Mr. Aquarius Latouche is a 35 y.o.  male past medical concerns including hypertension and end-stage renal disease on dialysis.  Patient presents to the emergency department from his outpatient dialysis center with complaints of shortness of breath.  Patient will be admitted under observation for  Acute pulmonary edema ([facility]) [J81.0] Volume overload [E87.70]  [facility] /MWF/right IJ PermCath  Acute respiratory distress with end-stage renal disease on dialysis  Appreciate vascular surgery exchange and right PermCath yesterday.  We will continue pain management and monitoring for bleeding. Currently utilizing during dialysis, adequate blood flow maintained during treatment.  Next treatment scheduled for Friday.  2. Anemia of chronic kidney disease  Lab Results  Component Value Date   HGB 7.6 (L) 04/19/2021  Hemoglobin below target, continue EPO with dialysis  3. Secondary Hyperparathyroidism:   Lab Results  Component Value Date   CALCIUM 9.0 04/19/2021   PHOS 7.5 (H) 04/19/2021    Continue calcium acetate, 2 capsules with meals.  4.  Hypertension with chronic kidney disease.  Home regimen includes amlodipine, carvedilol, minoxidil, and hydralazine.  All currently prescribed at this time.  BP 118/59 during dialysis   LOS: 1   1/25/20231:02 PM

## 2021-04-20 ENCOUNTER — Telehealth: Payer: Self-pay | Admitting: Student

## 2021-04-20 DIAGNOSIS — Z515 Encounter for palliative care: Secondary | ICD-10-CM

## 2021-04-20 DIAGNOSIS — Z7189 Other specified counseling: Secondary | ICD-10-CM

## 2021-04-20 LAB — CBC WITH DIFFERENTIAL/PLATELET
Abs Immature Granulocytes: 0.03 10*3/uL (ref 0.00–0.07)
Basophils Absolute: 0 10*3/uL (ref 0.0–0.1)
Basophils Relative: 0 %
Eosinophils Absolute: 0.9 10*3/uL — ABNORMAL HIGH (ref 0.0–0.5)
Eosinophils Relative: 11 %
HCT: 22.2 % — ABNORMAL LOW (ref 39.0–52.0)
Hemoglobin: 7.4 g/dL — ABNORMAL LOW (ref 13.0–17.0)
Immature Granulocytes: 0 %
Lymphocytes Relative: 13 %
Lymphs Abs: 1 10*3/uL (ref 0.7–4.0)
MCH: 31.4 pg (ref 26.0–34.0)
MCHC: 33.3 g/dL (ref 30.0–36.0)
MCV: 94.1 fL (ref 80.0–100.0)
Monocytes Absolute: 1 10*3/uL (ref 0.1–1.0)
Monocytes Relative: 12 %
Neutro Abs: 5 10*3/uL (ref 1.7–7.7)
Neutrophils Relative %: 64 %
Platelets: 265 10*3/uL (ref 150–400)
RBC: 2.36 MIL/uL — ABNORMAL LOW (ref 4.22–5.81)
RDW: 14.7 % (ref 11.5–15.5)
WBC: 7.9 10*3/uL (ref 4.0–10.5)
nRBC: 0 % (ref 0.0–0.2)

## 2021-04-20 LAB — COMPREHENSIVE METABOLIC PANEL
ALT: 7 U/L (ref 0–44)
AST: 10 U/L — ABNORMAL LOW (ref 15–41)
Albumin: 3.2 g/dL — ABNORMAL LOW (ref 3.5–5.0)
Alkaline Phosphatase: 44 U/L (ref 38–126)
Anion gap: 12 (ref 5–15)
BUN: 38 mg/dL — ABNORMAL HIGH (ref 6–20)
CO2: 27 mmol/L (ref 22–32)
Calcium: 8.9 mg/dL (ref 8.9–10.3)
Chloride: 93 mmol/L — ABNORMAL LOW (ref 98–111)
Creatinine, Ser: 9.22 mg/dL — ABNORMAL HIGH (ref 0.61–1.24)
GFR, Estimated: 7 mL/min — ABNORMAL LOW (ref >60)
Glucose, Bld: 84 mg/dL (ref 70–99)
Potassium: 4.3 mmol/L (ref 3.5–5.1)
Sodium: 132 mmol/L — ABNORMAL LOW (ref 135–145)
Total Bilirubin: 0.2 mg/dL — ABNORMAL LOW (ref 0.3–1.2)
Total Protein: 7.9 g/dL (ref 6.5–8.1)

## 2021-04-20 LAB — MAGNESIUM: Magnesium: 1.7 mg/dL (ref 1.7–2.4)

## 2021-04-20 LAB — PHOSPHORUS: Phosphorus: 6.1 mg/dL — ABNORMAL HIGH (ref 2.5–4.6)

## 2021-04-20 NOTE — Progress Notes (Signed)
PROGRESS NOTE    Travis Long  IHW:[id number] DOB: 02-12-1986 DOA: 04/17/2021 PCP: Pcp, No  Brief Narrative:  Patient is a 36 year old African-American male with past medical history significant for but not limited to ESRD on hemodialysis Monday Wednesday Friday, hypertension as well as other comorbidities who presented with pulmonary edema and volume overload with HD catheter malfunction.  On 04/17/2020 EKG was performed and 04/18/2021 is right IJ tunneled catheter was exchanged by vascular.  After that he had some pulsatile bleeding but is now improved.  He is underwent hemodialysis on 123 and is undergoing hemodialysis again today.  His hypertensive urgency is improved and he does continue to have some volume overload and flash pulmonary edema.  He had a CT of the chest done which showed a high-grade consolidation in the left lower lobe either pneumonia versus atelectasis as well as a hydropneumothorax that appears increased from prior CT scan done in December 2022.  He is seen by [name], CT surgery last hospitalization and case was discussed with Dr. Cyndia Bent from this hospitalization and Dr. Cyndia Bent feels that he has had a complex multiloculated left pleural effusion of several months of duration that was treated with a thoracentesis and pigtail catheter insertion for tPA at last hospitalization and he feels that this patient will never be a surgery candidate so CT surgery feels there is nothing to intervene on.  They feel that basically his lung will never fully re-expand due to scar tissue from his effusions.  Pulmonary Dr. Mortimer Fries was consluted and he felt they could not offer anything at this time and so subsequently Dr. Lanney Gins was consulted for further evaluation and management and to try to help set up CT Surgery at [facility] for Second opinion.  After further discussion with Dr. Lanney Gins he recommended trying to get the patient as dry as possible and recommends aggressive removal of fluid with  dialysis to help with his pleural effusions and then reimage the patient.  Patient was on room air this morning but continues to complain of shortness of breath.  We will dialyze the patient tomorrow and do an ambulatory home O2 screen and if he is stable we will have the patient follow-up with [facility] due to his advanced complex loculated pleural effusion with lung entrapment and ex vacuo physiology.  Assessment & Plan:   Principal Problem:   Volume overload Active Problems:   ESRD on hemodialysis (HCC)   Hypertension   Anemia in ESRD (end-stage renal disease) (HCC)   Pleural effusion   Hypertensive urgency  ESRD on Hemodialysis Monday Wednesday Friday Hyperphosphatemia -Was dialyzed on Monday and was dialyzed again yesterday.  His HD catheter was changed by vascular on 04/18/2021 given that was his malfunctioning -Patient's BUNs/creatinine went from 43/10.12 -> 37/9.53 -> 56/12.99 and is now 38/9.22 -We will continue monitor for bleeding and his next treatment is scheduled for Friday and will see if they can go for more aggressive dialysis volume management treatment -Patient's Phos Level was 7.5 -Vascular surgery is following and the next step is to perform the left wrist fistulogram to ensure that the outflow from the proximal forearm centrally is widely patent and once this is verified they will move forward with revision of his fistula in the operating room -C/w Calcium Acetate 1334 mg p.o. 3 times daily with meals -Further care per Nephro and the plan is to increase his ultrafiltration with HD tomorrow with a goal of 1 L/h at 13 mL/kg/hr' the goal of ultrafiltration tomorrow will be  4 L  Anemia of chronic kidney disease -Nephrology is following and his hemoglobin is below target and dropped from 8.3 to 7.6 yesterday and his hemoglobin/hematocrit is now 7.4/22.2 -Currently going to get EPO with dialysis sessions -Continue to monitor for signs and symptoms of bleeding; currently no overt  bleeding noted but he did have some yesterday when his [facility] was placed -Patient has had no bleeding today in dialysis went well  Hypertensive Urgency -Was present on admission likely in the setting of volume overload/pulmonary edema -This is resolved now -Blood pressure remains elevated -C/w Home Amlodipine 10 mg po Daily, Carvedilol 25 mg po BID, Hydralazine 100 mg po q8h, and Minoxidil 5 mg po Daily  -Last blood pressure reading was 138/73  Left lower lobe pneumonia with high-grade consolidation versus atelectasis and hydropneumothorax Bilateral Pleural Effusions  -Appears to be increased from CT scan on 03/06/2021 -He seen by CT surgery Dr. Cyndia Bent and Dr. Cyndia Bent felt hey feel that basically his lung will never fully re-expand due to scar tissue from his effusions and felt that CT surgery had nothing else to offer at this time and felt that his pleural biopsy of decortication would be high risk -Dr. Glendale Chard was consulted but he recommended getting a second opinion by CT surgery at [facility] or [facility] -Have consulted Dr. Lanney Gins for further evaluation and attempt to get him set up with [facility] CT surgery -Dr. Lanney Gins evaluated and the patient was not on oxygen therapy as he subjectively uses it when he feels short of breath.  He feels that the patient will need a second opinion referral but he recommends aggressively diuresing and removing fluid from the patient to help with his pleural effusions and then reimage to see how much lung entrapment the patient actually has if we can get him dry as possible -Chest x-ray from 04/18/2021 showed "No change since yesterday. Bilateral effusions, larger on the left than the right, with multiple air-fluid levels on the left. Associated pulmonary volume loss/infiltrate." -Repeat CXR in the AM   GERD/GI Prophylaxis -C/w Pantoprazole 40 mg   DVT prophylaxis: Heparin 5,000 units sq q8h Code Status: FULL CODE  Family Communication: No family present at bedside   Disposition Plan: Pending further clinical Improvement and clearance by specialists   Status is: Inpatient  Remains inpatient appropriate because: He needs further work-up and improvement in his respiratory status prior to safe discharge disposition and clearance by pulmonary as well as vascular surgery  Consultants:  PCCM/Pulmonary Dr. Lanney Gins Discussed the case with Dr. Cyndia Bent of Cardiothoracic Surgery Nephrology Vascular surgery  Procedures: HD  Duration of a tunneled dialysis catheter of the right IJ with the same venous approach by Dr. Delana Meyer on 04/18/21  Antimicrobials:  Anti-infectives (From admission, onward)    Start     Dose/Rate Route Frequency Ordered Stop   04/19/21 0600  ceFAZolin (ANCEF) IVPB 2g/100 mL premix        2 g 200 mL/hr over 30 Minutes Intravenous On call to O.R. 04/18/21 1203 04/20/21 0559   04/18/21 1441  ceFAZolin (ANCEF) 1-4 GM/50ML-% IVPB       Note to Pharmacy: Corlis Hove H: cabinet override      04/18/21 1441 04/19/21 0244        Subjective: Seen and examined at bedside and he had taken off his oxygen given that he feels it is giving him a headache given how dry it is.  No nausea or vomiting.  Continues to complain of shortness of breath and  states that he cannot ambulate very far and states that ambulating 17 steps gets very short of breath.  Asking to see if they can push his dry weight further down.  Denies any other concerns or complaints at this time.  Objective: Vitals:   04/20/21 0520 04/20/21 0751 04/20/21 1238 04/20/21 1500  BP: (!) 154/84 (!) 156/87 135/87 138/73  Pulse: 96 95 93 94  Resp: 20  18   Temp: 98.5 F (36.9 C) 98.6 F (37 C) 97.9 F (36.6 C) 98.2 F (36.8 C)  TempSrc: Oral Oral  Oral  SpO2: 95% 100% 95% 100%  Weight:      Height:        Intake/Output Summary (Last 24 hours) at 04/20/2021 1537 Last data filed at 04/20/2021 0900 Gross per 24 hour  Intake 240 ml  Output --  Net 240 ml    Filed Weights    04/17/21 1845 04/19/21 1030 04/19/21 1345  Weight: 81 kg 82.9 kg 80.9 kg   Examination: Physical Exam:  Constitutional: WN/WD overweight AAM in NAD and appears calm Eyes: Lids and conjunctivae normal, sclerae anicteric  ENMT: External Ears, Nose appear normal. Grossly normal hearing. Mucous membranes are moist.  Neck: Appears normal, supple, no cervical masses, normal ROM, no appreciable thyromegaly; no JVD Respiratory: Diminished to auscultation bilaterally coarse breath sounds and has some crackles worse on the left compared to right and has some rhonchi; no wheezing, rales. Has a normal respiratory effort and has his O2 off today.  Cardiovascular: RRR, no murmurs / rubs / gallops. S1 and S2 auscultated. Mild LE edema   Abdomen: Soft, non-tender, Distended 2/2 to body habitus. Bowel sounds positive.  GU: Deferred. Musculoskeletal: No clubbing / cyanosis of digits/nails. No joint deformity upper and lower extremities. Has a TDC in place in Right Chest Skin: No rashes, lesions, ulcers. No induration; Warm and dry.  Neurologic: CN 2-12 grossly intact with no focal deficits. Romberg sign and cerebellar reflexes not assessed.  Psychiatric: Normal judgment and insight. Alert and oriented x 3. Normal mood and appropriate affect.   Data Reviewed: I have personally reviewed following labs and imaging studies  CBC: Recent Labs  Lab 04/17/21 1026 04/19/21 0925 04/20/21 0543  WBC 7.2 7.9 7.9  NEUTROABS  --  5.4 5.0  HGB 8.3* 7.6* 7.4*  HCT 25.1* 22.9* 22.2*  MCV 94.4 94.6 94.1  PLT 256 247 193    Basic Metabolic Panel: Recent Labs  Lab 04/17/21 1026 04/18/21 0538 04/19/21 0925 04/20/21 0543  NA 133* 134* 133* 132*  K 4.0 4.6 5.0 4.3  CL 95* 96* 93* 93*  CO2 25 27 25 27   GLUCOSE 81 87 108* 84  BUN 43* 37* 56* 38*  CREATININE 10.12* 9.53* 12.99* 9.22*  CALCIUM 8.9 8.7* 9.0 8.9  MG  --   --  1.9 1.7  PHOS  --   --  7.5* 6.1*    GFR: Estimated Creatinine Clearance: 11.4  mL/min (A) (by C-G formula based on SCr of 9.22 mg/dL (H)). Liver Function Tests: Recent Labs  Lab 04/19/21 0925 04/20/21 0543  AST 11* 10*  ALT 7 7  ALKPHOS 55 44  BILITOT 0.5 0.2*  PROT 8.0 7.9  ALBUMIN 3.3* 3.2*    No results for input(s): LIPASE, AMYLASE in the last 168 hours. No results for input(s): AMMONIA in the last 168 hours. Coagulation Profile: No results for input(s): INR, PROTIME in the last 168 hours. Cardiac Enzymes: No results for input(s): CKTOTAL, CKMB, CKMBINDEX,  TROPONINI in the last 168 hours. BNP (last 3 results) No results for input(s): PROBNP in the last 8760 hours. HbA1C: No results for input(s): HGBA1C in the last 72 hours. CBG: No results for input(s): GLUCAP in the last 168 hours. Lipid Profile: No results for input(s): CHOL, HDL, LDLCALC, TRIG, CHOLHDL, LDLDIRECT in the last 72 hours. Thyroid Function Tests: No results for input(s): TSH, T4TOTAL, FREET4, T3FREE, THYROIDAB in the last 72 hours. Anemia Panel: No results for input(s): VITAMINB12, FOLATE, FERRITIN, TIBC, IRON, RETICCTPCT in the last 72 hours. Sepsis Labs: No results for input(s): PROCALCITON, LATICACIDVEN in the last 168 hours.  Recent Results (from the past 240 hour(s))  Resp Panel by RT-PCR (Flu A&B, Covid) Nasopharyngeal Swab     Status: None   Collection Time: 04/17/21  1:00 PM   Specimen: Nasopharyngeal Swab; Nasopharyngeal(NP) swabs in vial transport medium  Result Value Ref Range Status   SARS Coronavirus 2 by RT PCR NEGATIVE NEGATIVE Final    Comment: (NOTE) SARS-CoV-2 target nucleic acids are NOT DETECTED.  The SARS-CoV-2 RNA is generally detectable in upper respiratory specimens during the acute phase of infection. The lowest concentration of SARS-CoV-2 viral copies this assay can detect is 138 copies/mL. A negative result does not preclude SARS-Cov-2 infection and should not be used as the sole basis for treatment or other patient management decisions. A negative  result may occur with  improper specimen collection/handling, submission of specimen other than nasopharyngeal swab, presence of viral mutation(s) within the areas targeted by this assay, and inadequate number of viral copies(<138 copies/mL). A negative result must be combined with clinical observations, patient history, and epidemiological information. The expected result is Negative.  Fact Sheet for Patients:  EntrepreneurPulse.com.au  Fact Sheet for Healthcare Providers:  IncredibleEmployment.be  This test is no t yet approved or cleared by the [place] FDA and  has been authorized for detection and/or diagnosis of SARS-CoV-2 by FDA under an Emergency Use Authorization (EUA). This EUA will remain  in effect (meaning this test can be used) for the duration of the COVID-19 declaration under Section 564(b)(1) of the Act, 21 U.S.C.section 360bbb-3(b)(1), unless the authorization is terminated  or revoked sooner.       Influenza A by PCR NEGATIVE NEGATIVE Final   Influenza B by PCR NEGATIVE NEGATIVE Final    Comment: (NOTE) The Xpert Xpress SARS-CoV-2/FLU/RSV plus assay is intended as an aid in the diagnosis of influenza from Nasopharyngeal swab specimens and should not be used as a sole basis for treatment. Nasal washings and aspirates are unacceptable for Xpert Xpress SARS-CoV-2/FLU/RSV testing.  Fact Sheet for Patients: EntrepreneurPulse.com.au  Fact Sheet for Healthcare Providers: IncredibleEmployment.be  This test is not yet approved or cleared by the [place] FDA and has been authorized for detection and/or diagnosis of SARS-CoV-2 by FDA under an Emergency Use Authorization (EUA). This EUA will remain in effect (meaning this test can be used) for the duration of the COVID-19 declaration under Section 564(b)(1) of the Act, 21 U.S.C. section 360bbb-3(b)(1), unless the authorization is  terminated or revoked.  Performed at [facility], [address] [place], [place] [address]       RN Pressure Injury Documentation:     Estimated body mass index is 26.34 kg/m as calculated from the following:   Height as of this encounter: 5\' 9"  (1.753 m).   Weight as of this encounter: 80.9 kg.  Malnutrition Type:   Malnutrition Characteristics:   Nutrition Interventions:    Radiology  Studies: No results found.  Scheduled Meds:  amLODipine  10 mg Oral Daily   aspirin  325 mg Oral Daily   calcium acetate  1,334 mg Oral TID WC   carvedilol  25 mg Oral BID WC   Chlorhexidine Gluconate Cloth  6 each Topical Q0600   heparin  5,000 Units Subcutaneous Q8H   hydrALAZINE  100 mg Oral Q8H   minoxidil  5 mg Oral Daily   pantoprazole  40 mg Oral Daily   sodium chloride flush  3 mL Intravenous Q12H   Continuous Infusions:  sodium chloride     sodium chloride     sodium chloride      LOS: 2 days   Kerney Elbe, DO [facility] PAGER is on AMION  If 7PM-7AM, please contact night-coverage [email or web address]

## 2021-04-20 NOTE — Consult Note (Signed)
PULMONOLOGY         Date: 04/20/2021,   MRN# [id number] Travis Long 1985-12-31     AdmissionWeight: 79.4 kg                 CurrentWeight: 80.9 kg   Referring physician: Dr Laurell Josephs   CHIEF COMPLAINT:   Recurrent pleural effusion    HISTORY OF PRESENT ILLNESS   This is a 36 yo male with hx of recurrent pleural effusion with hx of ESRD on HD MWF s/p R IJ HD access placed. PCCM consulted to evaluate recurrent pleural left pleural effusion.   I reviewed CT chest with findings of bronchiectatic lungs and overlying moderate pleural effusions bilaterally with ex-vacuo physiology on right hemithorax due to rounded atelectasis and what appears to be lung entrapment.  He had thoracic surgery evaluation at [facility] and it was felt by surgery that it would be more harm to patient if he was to undergo decortication and invasive thoracoscopic management.He should be able to recruit more lung function via diuresis or more aggressive HD due to findings of bilateral effusions with compressive atelectasis.    04/20/21- patient is stable for dc home on room air. He is asked to follow up with [facility] or [facility] with thoracic surgery due to advanced complex loculated left pleural effusion with lung entrapment and ex-vacuo physiology. Patient is thankful and agreeable to plan.   PAST MEDICAL HISTORY   Past Medical History:  Diagnosis Date   ESRD on hemodialysis [facility])    Hypertension      SURGICAL HISTORY   Past Surgical History:  Procedure Laterality Date   A/V FISTULAGRAM Left 03/09/2021   Procedure: A/V Fistulagram;  Surgeon: Algernon Huxley, MD;  Location: [place] [facility];  Service: Cardiovascular;  Laterality: Left;   AV FISTULA PLACEMENT Left    DIALYSIS/PERMA CATHETER INSERTION N/A 04/18/2021   Procedure: DIALYSIS/PERMA CATHETER INSERTION;  Surgeon: Katha Cabal, MD;  Location: [place] [facility];  Service: Cardiovascular;  Laterality: N/A;   IR CATHETER TUBE CHANGE   03/09/2021     FAMILY HISTORY   Family History  Problem Relation Age of Onset   Hypertension Mother    Diabetes Mellitus II Mother      SOCIAL HISTORY   Social History   Tobacco Use   Smoking status: Former    Types: Cigarettes   Smokeless tobacco: Never  Substance Use Topics   Alcohol use: Not Currently   Drug use: Never     MEDICATIONS    Home Medication:    Current Medication:  Current Facility-Administered Medications:    0.9 %  sodium chloride infusion, 100 mL, Intravenous, PRN, Schnier, [name], MD   0.9 %  sodium chloride infusion, 100 mL, Intravenous, PRN, Schnier, [name], MD   0.9 %  sodium chloride infusion, 250 mL, Intravenous, PRN, Schnier, [name], MD   alteplase (CATHFLO ACTIVASE) injection 2 mg, 2 mg, Intracatheter, Once PRN, Schnier, [name], MD   amLODipine (NORVASC) tablet 10 mg, 10 mg, Oral, Daily, Schnier, [name], MD, 10 mg at 04/20/21 0940   aspirin tablet 325 mg, 325 mg, Oral, Daily, Schnier, [name], MD, 325 mg at 04/20/21 0940   calcium acetate (PHOSLO) capsule 1,334 mg, 1,334 mg, Oral, TID WC, Schnier, [name], MD, 1,334 mg at 04/20/21 1154   carvedilol (COREG) tablet 25 mg, 25 mg, Oral, BID WC, Schnier, [name], MD, 25 mg at 04/20/21 0804   Chlorhexidine  Gluconate Cloth 2 % PADS 6 each, 6 each, Topical, Q0600, Schnier, [name], MD, 6 each at 04/19/21 0730   heparin injection 1,000 Units, 1,000 Units, Dialysis, PRN, Schnier, [name], MD, 10,000 Units at 04/19/21 1341   heparin injection 5,000 Units, 5,000 Units, Subcutaneous, Q8H, Schnier, [name], MD, 5,000 Units at 04/18/21 2308   hydrALAZINE (APRESOLINE) tablet 100 mg, 100 mg, Oral, Q8H, Schnier, [name], MD, 100 mg at 04/20/21 0517   HYDROcodone-acetaminophen (NORCO/VICODIN) 5-325 MG per tablet 1 tablet, 1 tablet, Oral, Q6H PRN, Max Sane, MD, 1 tablet at 04/20/21 0804   lidocaine (PF) (XYLOCAINE) 1 % injection 5 mL, 5 mL, Intradermal, PRN, Schnier, [name],  MD   lidocaine-prilocaine (EMLA) cream 1 application, 1 application, Topical, PRN, Schnier, [name], MD   minoxidil (LONITEN) tablet 5 mg, 5 mg, Oral, Daily, Schnier, [name], MD, 5 mg at 04/20/21 0940   pantoprazole (PROTONIX) EC tablet 40 mg, 40 mg, Oral, Daily, Schnier, [name], MD   pentafluoroprop-tetrafluoroeth (GEBAUERS) aerosol 1 application, 1 application, Topical, PRN, Schnier, [name], MD   sodium chloride flush (NS) 0.9 % injection 3 mL, 3 mL, Intravenous, Q12H, Schnier, [name], MD, 3 mL at 04/20/21 0946   sodium chloride flush (NS) 0.9 % injection 3 mL, 3 mL, Intravenous, PRN, Schnier, [name], MD    ALLERGIES   Patient has no known allergies.     REVIEW OF SYSTEMS    Review of Systems:  Gen:  Denies  fever, sweats, chills weigh loss  HEENT: Denies blurred vision, double vision, ear pain, eye pain, hearing loss, nose bleeds, sore throat Cardiac:  No dizziness, chest pain or heaviness, chest tightness,edema Resp:   Denies cough or sputum porduction, shortness of breath,wheezing, hemoptysis,  Gi: Denies swallowing difficulty, stomach pain, nausea or vomiting, diarrhea, constipation, bowel incontinence Gu:  Denies bladder incontinence, burning urine Ext:   Denies Joint pain, stiffness or swelling Skin: Denies  skin rash, easy bruising or bleeding or hives Endoc:  Denies polyuria, polydipsia , polyphagia or weight change Psych:   Denies depression, insomnia or hallucinations   Other:  All other systems negative   VS: BP (!) 156/87 (BP Location: Right Arm)    Pulse 95    Temp 98.6 F (37 C) (Oral)    Resp 20    Ht 5\' 9"  (1.753 m)    Wt 80.9 kg    SpO2 100%    BMI 26.34 kg/m      PHYSICAL EXAM    GENERAL:NAD, no fevers, chills, no weakness no fatigue HEAD: Normocephalic, atraumatic.  EYES: Pupils equal, round, reactive to light. Extraocular muscles intact. No scleral icterus.  MOUTH: Moist mucosal membrane. Dentition intact. No abscess noted.  EAR,  NOSE, THROAT: Clear without exudates. No external lesions.  NECK: Supple. No thyromegaly. No nodules. No JVD.  PULMONARY: decreased air entry bilaterally worse on left CARDIOVASCULAR: S1 and S2. Regular rate and rhythm. No murmurs, rubs, or gallops. No edema. Pedal pulses 2+ bilaterally.  GASTROINTESTINAL: Soft, nontender, nondistended. No masses. Positive bowel sounds. No hepatosplenomegaly.  MUSCULOSKELETAL: No swelling, clubbing, or edema. Range of motion full in all extremities.  NEUROLOGIC: Cranial nerves II through XII are intact. No gross focal neurological deficits. Sensation intact. Reflexes intact.  SKIN: No ulceration, lesions, rashes, or cyanosis. Skin warm and dry. Turgor intact.  PSYCHIATRIC: Mood, affect within normal limits. The patient is awake, alert and oriented x 3. Insight, judgment intact.       IMAGING  DG Chest 2 View  Result Date: 04/18/2021 CLINICAL DATA:  Shortness of breath.  End-stage renal disease. EXAM: CHEST - 2 VIEW COMPARISON:  04/17/2021 FINDINGS: No critical change since yesterday. Central line tip in the SVC above the right atrium. Bilateral pleural fluid collections, larger on the left than the right. Multiple air-fluid levels on the left appears similar. Associated pulmonary atelectasis/infiltrate appears the same. IMPRESSION: No change since yesterday. Bilateral effusions, larger on the left than the right, with multiple air-fluid levels on the left. Associated pulmonary volume loss/infiltrate. Electronically Signed   By: Nelson Chimes M.D.   On: 04/18/2021 10:21   DG Chest 2 View  Result Date: 04/17/2021 CLINICAL DATA:  Shortness of breath.  Dialysis. EXAM: CHEST - 2 VIEW COMPARISON:  March 15, 2021 FINDINGS: Dual lumen dialysis catheter with tip projecting over the superior cavoatrial junction. The heart size and mediastinal contours are obscured. Large right pleural effusion with atelectasis versus infiltrate involving the left lung. Two focal gas  fluid levels in the left hemithorax 1 of the mid lung level the other at the apex may reflect focal areas of aerated lung but abscess not excluded consider further evaluation with chest CT. Small right pleural effusion. Bilateral pulmonary edema. The visualized skeletal structures are unchanged. IMPRESSION: Large left pleural effusion with a combination of edema and atelectasis versus infiltrate in the left lung, additionally there are 2 focal gas fluid-levels in the left hemithorax 1 of the mid lung level the other in the apex which may reflect focal areas of aerated lung but abscess not excluded, consider further evaluation with chest CT. Small right pleural effusion with right lung atelectasis and edema. Electronically Signed   By: Dahlia Bailiff M.D.   On: 04/17/2021 11:19   CT Chest Wo Contrast  Result Date: 04/17/2021 CLINICAL DATA:  Pneumonia. Complication suspected. Abnormal chest radiograph. After 3 hours of dialysis treatment today common dialysis machine with stopping was told he needed dialysis access changed out. Hypertension. Patient has not felt well for 1 week. EXAM: CT CHEST WITHOUT CONTRAST TECHNIQUE: Multidetector CT imaging of the chest was performed following the standard protocol without IV contrast. RADIATION DOSE REDUCTION: This exam was performed according to the departmental dose-optimization program which includes automated exposure control, adjustment of the mA and/or kV according to patient size and/or use of iterative reconstruction technique. COMPARISON:  Chest radiographs 04/17/2021, AP chest 03/15/2021, 03/09/2021, 09/16/2020; CT chest 03/06/2021 FINDINGS: Right internal jugular dual-lumen central venous catheter with tips within the central superior vena cava. Cardiovascular: Heart size is again mildly enlarged. Trace pericardial fluid is slightly increased from prior. No thoracic aortic aneurysm. Mediastinum/Nodes: No pathologically enlarged axillary lymph nodes by CT criteria.  There are again multiple mildly enlarged mediastinal lymph nodes. The largest is again seen within the inferior right paratracheal region measuring up to 1.3 cm in short axis, not significantly changed from prior when measured in a similar manner. Again these are likely reactive. No definite hilar lymphadenopathy is seen, within the limitations of lack of IV contrast.The thyroid gland is grossly unremarkable. The esophagus follows a normal course of normal caliber. Lungs/Pleura: There is again opacification of the distal segmental left lower lobe airways. There is again high-grade consolidation within the posterior left lower lobe. Moderate bilateral pleural effusions are mildly increased from prior. Interval increase in air component of left-sided hydropneumothorax. This includes an air component measuring up to approximately 2.8 cm in craniocaudal dimension on the current CT, increased from 2.1 cm on 03/06/2021 CT (  as measured on sagittal series 6, image 122). There is also interval increase in air within the more inferior posterolateral hydropneumothorax component on this supine CT with air newly seen within the non dependent regions of the left mid and lower lung (axial series 3, images 84 through 99). Previously a small amount of air was seen within the posteromedial left lower lobe pleural space, and this is still mild but slightly increased from prior (axial series 3 images 63 through 84). Overall there is interval increase in fluid and air components of this left-sided hydropneumothorax. There again mild right peripheral paraseptal cystic emphysematous changes. Upper Abdomen: There is partial visualization of the kidneys which demonstrate moderate to high-grade atrophy consistent with known chronic kidney disease. Moderate to high-grade bilateral gynecomastia is again noted. Musculoskeletal: There is diffuse increased bone density again seen, consistent with chronic renal failure. IMPRESSION:: IMPRESSION: 1.  Compared to 03/06/2021, there is interval increase in the fluid and air components of a moderate left hydropneumothorax. Interval mild increase in moderate bilateral pleural effusions. There again appear to be multiple loculations within the hydropneumothorax, also increased from prior. 2. There is again high-grade consolidation of the posterior left lower lobe either pneumonia versus round atelectasis. 3. Note is made of prior 03/09/2021 basilar pleural pigtail catheter placement in the region of this hydropneumothorax on prior 03/09/2021 radiographs. Following removal of this pigtail catheter, the extrapleural air and fluid appears to have increased over time. Electronically Signed   By: Yvonne Kendall M.D.   On: 04/17/2021 14:17   PERIPHERAL VASCULAR CATHETERIZATION  Result Date: 04/18/2021 See surgical note for result.     ASSESSMENT/PLAN   Bilateral pleural effusions worse on left with lung entrapment         - patient is not on oxygen therapy          - he had thoracic surgery evaluation and is not candidate for surgery          -he improved with HD          -patient has been referred to [facility] for second opinion    ESRD on HD  - patient does have fluid overload and would benefit from aggressive removal of fluid to help with pleural effusions   Major depression    - patient lost job and car and this is due to medical disease, recommend outpatient follow up with psychiatry       Thank you for allowing me to participate in the care of this patient.    Patient/Family are satisfied with care plan and all questions have been answered.  This document was prepared using Dragon voice recognition software and may include unintentional dictation errors.     Ottie Glazier, M.D.  Division of [place]

## 2021-04-20 NOTE — Consult Note (Signed)
Consultation Note Date: 04/20/2021   Patient Name: Travis Long  DOB: 1985/05/14  MRN: [id number]  Age / Sex: 36 y.o., male  PCP: Pcp, No Referring Physician: Kerney Elbe, DO  Reason for Consultation: Establishing goals of care  HPI/Patient Profile: 36 y.o. male  with past medical history of htn and ESRD on HD admitted on 04/17/2021 with complaints of shortness of breath.  He had a CT of the chest done which showed a high-grade consolidation in the left lower lobe either pneumonia versus atelectasis as well as a hydropneumothorax that appears increased from prior CT scan done in December 2022. CT surgery at [facility] saw last hospitalization and felt that his lung will never fully re-expand due to scar tissue from his effusions. Dr. Lanney Gins was consulted for further evaluation and management and to try to help set up CT Surgery at [facility] for Second opinion. PMT consulted to discuss [place] d/t "noncompliance and refusal of care at times".  Clinical Assessment and Goals of Care: I have reviewed medical records including EPIC notes, labs and imaging, assessed the patient and then met with patient  to discuss diagnosis prognosis, GOC, EOL wishes, disposition and options.  He is pleasant and agreeable to speaking with me.  I introduced Palliative Medicine as specialized medical care for people living with serious illness. It focuses on providing relief from the symptoms and stress of a serious illness. The goal is to improve quality of life for both the patient and the family.  He tells me he is grateful for the care he has been receiving and feels "really positive" today. He tells me at one point he did not trust the medical team and did not believe he was as sick as he was being told he is  however now he recognizes how much better he feels when he comes to the hospital for treatment and he recognizes that he improves after prescribed interventions.   He  tells me he stays short of breath most of the time and he has learned to expect this. He tells me he continues with HD and has been tolerating it well.   I attempted to elicit values and goals of care important to the patient.  He tells me he is interested in any care offered to prolong his life.  Discussed with patient the importance of continued conversation with family and the medical providers regarding overall plan of care and treatment options, ensuring decisions are within the context of the patients values and GOCs.    He tells me if he were ever unable to make decisions for himself he would want his mother Travis Long to make decisions for him - he feels she understands his goals and wishes well and would make decisions in line with his goal to prolong life as long as possible.   Questions and concerns were addressed. The family was encouraged to call with questions or concerns.   Primary Decision Maker PATIENT    SUMMARY OF RECOMMENDATIONS   - full code/full scope - patient feeling positive today and expresses interested in any recommendation made by the medical team to prolong his life/improve his condition  Code Status/Advance Care Planning: Full code      Primary Diagnoses: Present on Admission:  Volume overload  Hypertension  Anemia in ESRD (end-stage renal disease) ([place])  Pleural effusion  Hypertensive urgency   I have reviewed the medical record, interviewed the patient and family, and examined the patient. The following aspects are pertinent.  Past  Medical History:  Diagnosis Date   ESRD on hemodialysis ([place])    Hypertension    Social History   Socioeconomic History   Marital status: Single    Spouse name: Not on file   Number of children: Not on file   Years of education: Not on file   Highest education level: Not on file  Occupational History   Not on file  Tobacco Use   Smoking status: Former    Types: Cigarettes   Smokeless tobacco: Never   Substance and Sexual Activity   Alcohol use: Not Currently   Drug use: Never   Sexual activity: Not on file  Other Topics Concern   Not on file  Social History Narrative   Not on file   Social Determinants of Health   Financial Resource Strain: Not on file  Food Insecurity: Not on file  Transportation Needs: Not on file  Physical Activity: Not on file  Stress: Not on file  Social Connections: Not on file   Family History  Problem Relation Age of Onset   Hypertension Mother    Diabetes Mellitus II Mother    Scheduled Meds:  amLODipine  10 mg Oral Daily   aspirin  325 mg Oral Daily   calcium acetate  1,334 mg Oral TID WC   carvedilol  25 mg Oral BID WC   Chlorhexidine Gluconate Cloth  6 each Topical Q0600   heparin  5,000 Units Subcutaneous Q8H   hydrALAZINE  100 mg Oral Q8H   minoxidil  5 mg Oral Daily   pantoprazole  40 mg Oral Daily   sodium chloride flush  3 mL Intravenous Q12H   Continuous Infusions:  sodium chloride     sodium chloride     sodium chloride     PRN Meds:.sodium chloride, sodium chloride, sodium chloride, alteplase, heparin, HYDROcodone-acetaminophen, lidocaine (PF), lidocaine-prilocaine, pentafluoroprop-tetrafluoroeth, sodium chloride flush No Known Allergies Review of Systems  Respiratory:  Positive for shortness of breath.   All other systems reviewed and are negative.  Physical Exam Constitutional:      General: He is not in acute distress. Pulmonary:     Effort: Pulmonary effort is normal.     Comments: Remains on [place] [facility]:     Mental Status: He is alert and oriented to person, place, and time.  Psychiatric:        Mood and Affect: Mood normal.        Behavior: Behavior normal.    Vital Signs: BP 135/87 (BP Location: Right Arm)    Pulse 93    Temp 97.9 F (36.6 C)    Resp 18    Ht '5\' 9"'  (1.753 m)    Wt 80.9 kg    SpO2 95%    BMI 26.34 kg/m  Pain Scale: 0-10   Pain Score: 8    SpO2: SpO2: 95 % O2 Device:SpO2: 95 % O2  Flow Rate: .O2 Flow Rate (L/min): 2 L/min  IO: Intake/output summary:  Intake/Output Summary (Last 24 hours) at 04/20/2021 1522 Last data filed at 04/20/2021 0900 Gross per 24 hour  Intake 240 ml  Output --  Net 240 ml    LBM: Last BM Date: 04/19/21 (per pt) Baseline Weight: Weight: 79.4 kg Most recent weight: Weight: 80.9 kg     Palliative Assessment/Data: PPS 60%     Juel Burrow, DNP, AGNP-C Palliative Medicine Team [phone] Pager: [phone]

## 2021-04-20 NOTE — Telephone Encounter (Signed)
Called and spoke with pt who is requesting a call from Dr. Verlee Monte as he is still in the hospital.  Routing to Dr. Verlee Monte. Please advise.

## 2021-04-20 NOTE — Progress Notes (Signed)
MD made aware that patient refused heparin, coreg and phoslo today.

## 2021-04-20 NOTE — Progress Notes (Signed)
[facility]  ROUNDING NOTE   Subjective:   Travis Long is a 36 year old African-American male with past medical concerns including hypertension and end-stage renal disease on dialysis.  Patient presents to the emergency department from his outpatient dialysis center with complaints of shortness of breath.  Patient will be admitted under observation for Acute pulmonary edema ([place]) [J81.0] Volume overload [E87.70]  Patient is known to our clinic and receives outpatient dialysis treatments at [facility], supervised by Dr. Holley Raring.  Patient receives dialysis on a MWF schedule.    Patient resting in bed Alert and oriented Voiced concerns about lack of fluid removal during treatment and shortness of breath after stairs and extended conversations.  Patient removes oxygen when not needed and replaces when feeling short of breath  Objective:  Vital signs in last 24 hours:  Temp:  [97.9 F (36.6 C)-98.8 F (37.1 C)] 97.9 F (36.6 C) (01/26 1238) Pulse Rate:  [57-97] 93 (01/26 1238) Resp:  [13-32] 18 (01/26 1238) BP: (96-156)/(41-87) 135/87 (01/26 1238) SpO2:  [94 %-100 %] 95 % (01/26 1238) Weight:  [80.9 kg] 80.9 kg (01/25 1345)  Weight change:  Filed Weights   04/17/21 1845 04/19/21 1030 04/19/21 1345  Weight: 81 kg 82.9 kg 80.9 kg    Intake/Output: I/O last 3 completed shifts: In: -  Out: 2001 [Other:2001]   Intake/Output this shift:  Total I/O In: 240 [P.O.:240] Out: -   Physical Exam: General: NAD, resting in bed  Head: Normocephalic, atraumatic. Moist oral mucosal membranes  Eyes: Anicteric  Lungs:  Diminished on left, normal effort, 2 L [place]  Heart: Regular rate and rhythm  Abdomen:  Soft, nontender  Extremities:  No peripheral edema.  Neurologic: Nonfocal, moving all four extremities  Skin: No lesions  Access: Right IJ PermCath, Lt lower AVF    Basic Metabolic Panel: Recent Labs  Lab 04/17/21 1026 04/18/21 0538 04/19/21 0925 04/20/21 0543   NA 133* 134* 133* 132*  K 4.0 4.6 5.0 4.3  CL 95* 96* 93* 93*  CO2 25 27 25 27   GLUCOSE 81 87 108* 84  BUN 43* 37* 56* 38*  CREATININE 10.12* 9.53* 12.99* 9.22*  CALCIUM 8.9 8.7* 9.0 8.9  MG  --   --  1.9 1.7  PHOS  --   --  7.5* 6.1*     Liver Function Tests: Recent Labs  Lab 04/19/21 0925 04/20/21 0543  AST 11* 10*  ALT 7 7  ALKPHOS 55 44  BILITOT 0.5 0.2*  PROT 8.0 7.9  ALBUMIN 3.3* 3.2*    No results for input(s): LIPASE, AMYLASE in the last 168 hours. No results for input(s): AMMONIA in the last 168 hours.  CBC: Recent Labs  Lab 04/17/21 1026 04/19/21 0925 04/20/21 0543  WBC 7.2 7.9 7.9  NEUTROABS  --  5.4 5.0  HGB 8.3* 7.6* 7.4*  HCT 25.1* 22.9* 22.2*  MCV 94.4 94.6 94.1  PLT 256 247 265     Cardiac Enzymes: No results for input(s): CKTOTAL, CKMB, CKMBINDEX, TROPONINI in the last 168 hours.  BNP: Invalid input(s): POCBNP  CBG: No results for input(s): GLUCAP in the last 168 hours.  Microbiology: Results for orders placed or performed during the hospital encounter of 04/17/21  Resp Panel by RT-PCR (Flu A&B, Covid) Nasopharyngeal Swab     Status: None   Collection Time: 04/17/21  1:00 PM   Specimen: Nasopharyngeal Swab; Nasopharyngeal(NP) swabs in vial transport medium  Result Value Ref Range Status   SARS Coronavirus 2  by RT PCR NEGATIVE NEGATIVE Final    Comment: (NOTE) SARS-CoV-2 target nucleic acids are NOT DETECTED.  The SARS-CoV-2 RNA is generally detectable in upper respiratory specimens during the acute phase of infection. The lowest concentration of SARS-CoV-2 viral copies this assay can detect is 138 copies/mL. A negative result does not preclude SARS-Cov-2 infection and should not be used as the sole basis for treatment or other patient management decisions. A negative result may occur with  improper specimen collection/handling, submission of specimen other than nasopharyngeal swab, presence of viral mutation(s) within the areas  targeted by this assay, and inadequate number of viral copies(<138 copies/mL). A negative result must be combined with clinical observations, patient history, and epidemiological information. The expected result is Negative.  Fact Sheet for Patients:  EntrepreneurPulse.com.au  Fact Sheet for Healthcare Providers:  IncredibleEmployment.be  This test is no t yet approved or cleared by the [place] FDA and  has been authorized for detection and/or diagnosis of SARS-CoV-2 by FDA under an Emergency Use Authorization (EUA). This EUA will remain  in effect (meaning this test can be used) for the duration of the COVID-19 declaration under Section 564(b)(1) of the Act, 21 U.S.C.section 360bbb-3(b)(1), unless the authorization is terminated  or revoked sooner.       Influenza A by PCR NEGATIVE NEGATIVE Final   Influenza B by PCR NEGATIVE NEGATIVE Final    Comment: (NOTE) The Xpert Xpress SARS-CoV-2/FLU/RSV plus assay is intended as an aid in the diagnosis of influenza from Nasopharyngeal swab specimens and should not be used as a sole basis for treatment. Nasal washings and aspirates are unacceptable for Xpert Xpress SARS-CoV-2/FLU/RSV testing.  Fact Sheet for Patients: EntrepreneurPulse.com.au  Fact Sheet for Healthcare Providers: IncredibleEmployment.be  This test is not yet approved or cleared by the [place] FDA and has been authorized for detection and/or diagnosis of SARS-CoV-2 by FDA under an Emergency Use Authorization (EUA). This EUA will remain in effect (meaning this test can be used) for the duration of the COVID-19 declaration under Section 564(b)(1) of the Act, 21 U.S.C. section 360bbb-3(b)(1), unless the authorization is terminated or revoked.  Performed at [facility], [address] [place], [place] [address]     Coagulation Studies: No results for input(s): LABPROT,  INR in the last 72 hours.  Urinalysis: No results for input(s): COLORURINE, LABSPEC, PHURINE, GLUCOSEU, HGBUR, BILIRUBINUR, KETONESUR, PROTEINUR, UROBILINOGEN, NITRITE, LEUKOCYTESUR in the last 72 hours.  Invalid input(s): APPERANCEUR    Imaging: PERIPHERAL VASCULAR CATHETERIZATION  Result Date: 04/18/2021 See surgical note for result.    Medications:    sodium chloride     sodium chloride     sodium chloride      amLODipine  10 mg Oral Daily   aspirin  325 mg Oral Daily   calcium acetate  1,334 mg Oral TID WC   carvedilol  25 mg Oral BID WC   Chlorhexidine Gluconate Cloth  6 each Topical Q0600   heparin  5,000 Units Subcutaneous Q8H   hydrALAZINE  100 mg Oral Q8H   minoxidil  5 mg Oral Daily   pantoprazole  40 mg Oral Daily   sodium chloride flush  3 mL Intravenous Q12H     Assessment/ Plan:  [name] is a 36 y.o.  male past medical concerns including hypertension and end-stage renal disease on dialysis.  Patient presents to the emergency department from his outpatient dialysis center with complaints of shortness of breath.  Patient will be admitted under  observation for Acute pulmonary edema (HCC) [J81.0] Volume overload [E87.70]  [facility]/MWF/right IJ PermCath  Acute respiratory distress with end-stage renal disease on dialysis  Appreciate vascular surgery exchange and right PermCath 04/18/21.  Received a successful dialysis treatment yesterday.  UF goal 2 L achieved.  We will challenge patient during treatment tomorrow, UF goal 4 L as tolerated.  Patient agreeable to this goal.  2. Anemia of chronic kidney disease  Lab Results  Component Value Date   HGB 7.4 (L) 04/20/2021  Hemoglobin remains below desired goal, continue EPO with dialysis  3. Secondary Hyperparathyroidism:   Lab Results  Component Value Date   CALCIUM 8.9 04/20/2021   PHOS 6.1 (H) 04/20/2021  We will continue to monitor bone minerals during this admission Continue  calcium acetate, 2 capsules with meals.  4.  Hypertension with chronic kidney disease.  Home regimen includes amlodipine, carvedilol, minoxidil, and hydralazine.  All currently prescribed at this time.  BP 135/87  5.  Bilateral pleural effusions with left lung entrapment Pulmonology following.  Recommend increase fluid removal with dialysis.    LOS: 2   1/26/20231:09 PM

## 2021-04-20 NOTE — TOC Initial Note (Signed)
Transition of Care [facility]) - Initial/Assessment Note    Patient Details  Name: Travis Long MRN: [id number] Date of Birth: 08-24-1985  [facility]) CM/SW Contact:    Candie Chroman, LCSW Phone Number: 04/20/2021, 10:29 AM  Clinical Narrative:  Readmission prevention screen complete. CSW met with patient. No supports at bedside. CSW introduced role and explained that discharge planning would be discussed. Patient does not have a PCP. TOC coworker made an appointment last admission with Dr. Shan Levans at [facility] for 3/20. CSW called and confirmed this appointment. Information added to AVS because patient said he was not aware of it. Patient said he is supposed to have a new patient appointment at [facility] tomorrow. Family drives him to appointments and HD. [facility]. No issues obtaining medications. No home health or DME use prior to admission. Family will take him home at discharge. No further concerns. CSW encouraged patient to contact CSW as needed. CSW will continue to follow patient for support and facilitate return home when stable.             Expected Discharge Plan: Home/Self Care Barriers to Discharge: Continued Medical Work up   Patient Goals and CMS Choice        Expected Discharge Plan and Services Expected Discharge Plan: Home/Self Care     Post Acute Care Choice: NA Living arrangements for the past 2 months: Single Family Home                                      Prior Living Arrangements/Services Living arrangements for the past 2 months: Single Family Home   Patient language and need for interpreter reviewed:: Yes Do you feel safe going back to the place where you live?: Yes      Need for Family Participation in Patient Care: Yes (Comment) Care giver support system in place?: Yes (comment)   Criminal Activity/Legal Involvement Pertinent to Current Situation/Hospitalization: No - Comment as needed  Activities of  Daily Living Home Assistive Devices/Equipment: None ADL Screening (condition at time of admission) Patient's cognitive ability adequate to safely complete daily activities?: Yes Is the patient deaf or have difficulty hearing?: No Does the patient have difficulty seeing, even when wearing glasses/contacts?: No Does the patient have difficulty concentrating, remembering, or making decisions?: No Patient able to express need for assistance with ADLs?: Yes Does the patient have difficulty dressing or bathing?: No Independently performs ADLs?: Yes (appropriate for developmental age) Does the patient have difficulty walking or climbing stairs?: No Weakness of Legs: None Weakness of Arms/Hands: None  Permission Sought/Granted                  Emotional Assessment Appearance:: Appears stated age Attitude/Demeanor/Rapport: Engaged, Gracious Affect (typically observed): Accepting, Appropriate, Calm, Pleasant Orientation: : Oriented to Self, Oriented to Place, Oriented to  Time, Oriented to Situation Alcohol / Substance Use: Not Applicable Psych Involvement: No (comment)  Admission diagnosis:  Acute pulmonary edema (HCC) [J81.0] Volume overload [E87.70] Patient Active Problem List   Diagnosis Date Noted   Volume overload 04/17/2021   Adjustment disorder    Encephalopathy 03/11/2021   Acute on chronic anemia    ESRD (end stage renal disease) ([place])    Heart failure with preserved ejection fraction (HCC)    Stress reaction causing mixed disturbance of emotion and conduct 03/10/2021   Medically noncompliant 03/08/2021  SOB (shortness of breath)    Hypertensive urgency 03/06/2021   Acute on chronic diastolic CHF (congestive heart failure) ([place]) 03/06/2021   CAP (community acquired pneumonia) 03/06/2021   Hypertensive emergency 11/30/2020   ESRD on hemodialysis ([place])    Hypertension    Acute pulmonary edema ([place])    Anemia in ESRD (end-stage renal disease) ([place])    Tobacco abuse     Pleural effusion    Acute respiratory failure with hypoxia ([place]) 09/16/2020   PCP:  Pcp, No Pharmacy:   [id number] [id number]: [phone]: [phone]    Social Determinants of Health (SDOH) Interventions    Readmission Risk Interventions Readmission Risk Prevention Plan 04/20/2021  Transportation Screening Complete  PCP or Specialist Appt within 3-5 Days Complete  Social Work Consult for [place]Counseling Complete  Palliative Care Screening Not Applicable  Medication Review (Press photographer Complete

## 2021-04-20 NOTE — Progress Notes (Signed)
Pt refused CHG bath and heparin this am. NP notified.

## 2021-04-21 ENCOUNTER — Inpatient Hospital Stay: Payer: Medicaid Other | Admitting: Student

## 2021-04-21 ENCOUNTER — Inpatient Hospital Stay: Payer: Medicare Other

## 2021-04-21 LAB — CBC WITH DIFFERENTIAL/PLATELET
Abs Immature Granulocytes: 0.05 10*3/uL (ref 0.00–0.07)
Basophils Absolute: 0 10*3/uL (ref 0.0–0.1)
Basophils Relative: 0 %
Eosinophils Absolute: 0.8 10*3/uL — ABNORMAL HIGH (ref 0.0–0.5)
Eosinophils Relative: 8 %
HCT: 22.7 % — ABNORMAL LOW (ref 39.0–52.0)
Hemoglobin: 7.4 g/dL — ABNORMAL LOW (ref 13.0–17.0)
Immature Granulocytes: 1 %
Lymphocytes Relative: 6 %
Lymphs Abs: 0.6 10*3/uL — ABNORMAL LOW (ref 0.7–4.0)
MCH: 31.2 pg (ref 26.0–34.0)
MCHC: 32.6 g/dL (ref 30.0–36.0)
MCV: 95.8 fL (ref 80.0–100.0)
Monocytes Absolute: 1 10*3/uL (ref 0.1–1.0)
Monocytes Relative: 9 %
Neutro Abs: 8.2 10*3/uL — ABNORMAL HIGH (ref 1.7–7.7)
Neutrophils Relative %: 76 %
Platelets: 273 10*3/uL (ref 150–400)
RBC: 2.37 MIL/uL — ABNORMAL LOW (ref 4.22–5.81)
RDW: 14.6 % (ref 11.5–15.5)
WBC: 10.7 10*3/uL — ABNORMAL HIGH (ref 4.0–10.5)
nRBC: 0 % (ref 0.0–0.2)

## 2021-04-21 LAB — COMPREHENSIVE METABOLIC PANEL
ALT: 6 U/L (ref 0–44)
AST: 13 U/L — ABNORMAL LOW (ref 15–41)
Albumin: 3.2 g/dL — ABNORMAL LOW (ref 3.5–5.0)
Alkaline Phosphatase: 51 U/L (ref 38–126)
Anion gap: 15 (ref 5–15)
BUN: 51 mg/dL — ABNORMAL HIGH (ref 6–20)
CO2: 28 mmol/L (ref 22–32)
Calcium: 8.7 mg/dL — ABNORMAL LOW (ref 8.9–10.3)
Chloride: 87 mmol/L — ABNORMAL LOW (ref 98–111)
Creatinine, Ser: 12.4 mg/dL — ABNORMAL HIGH (ref 0.61–1.24)
GFR, Estimated: 5 mL/min — ABNORMAL LOW (ref >60)
Glucose, Bld: 115 mg/dL — ABNORMAL HIGH (ref 70–99)
Potassium: 4.8 mmol/L (ref 3.5–5.1)
Sodium: 130 mmol/L — ABNORMAL LOW (ref 135–145)
Total Bilirubin: 0.4 mg/dL (ref 0.3–1.2)
Total Protein: 8 g/dL (ref 6.5–8.1)

## 2021-04-21 LAB — PHOSPHORUS: Phosphorus: 6.7 mg/dL — ABNORMAL HIGH (ref 2.5–4.6)

## 2021-04-21 LAB — MRSA NEXT GEN BY PCR, NASAL: MRSA by PCR Next Gen: NOT DETECTED

## 2021-04-21 LAB — MAGNESIUM: Magnesium: 1.7 mg/dL (ref 1.7–2.4)

## 2021-04-21 MED ORDER — EPOETIN ALFA 10000 UNIT/ML IJ SOLN
10000.0000 [IU] | INTRAMUSCULAR | Status: DC
  Administered 2021-04-21 – 2021-04-26 (×3): 10000 [IU] via INTRAVENOUS
  Filled 2021-04-21 (×2): qty 1

## 2021-04-21 MED ORDER — HEPARIN SODIUM (PORCINE) 1000 UNIT/ML IJ SOLN
INTRAMUSCULAR | Status: AC
  Filled 2021-04-21: qty 10

## 2021-04-21 MED ORDER — EPOETIN ALFA 10000 UNIT/ML IJ SOLN
INTRAMUSCULAR | Status: AC
  Filled 2021-04-21: qty 1

## 2021-04-21 NOTE — Progress Notes (Signed)
Patient with lower blood pressure requesting UF be held, patient states that he is dizzy, additional time on machine required for fluid removal.

## 2021-04-21 NOTE — Care Management Important Message (Signed)
Important Message  Patient Details  Name: Travis Long MRN: [id number] Date of Birth: 1985/10/11   Medicare Important Message Given:  Yes     Juliann Pulse A Nadeem Romanoski 04/21/2021, 12:20 PM

## 2021-04-21 NOTE — Progress Notes (Signed)
PROGRESS NOTE    Travis Long  JJO:[id number] DOB: 08-29-1985 DOA: 04/17/2021 PCP: Pcp, No  Brief Narrative:  Patient is a 36 year old African-American male with past medical history significant for but not limited to ESRD on hemodialysis Monday Wednesday Friday, hypertension as well as other comorbidities who presented with pulmonary edema and volume overload with HD catheter malfunction.  On 04/17/2020 EKG was performed and 04/18/2021 is right IJ tunneled catheter was exchanged by vascular.  After that he had some pulsatile bleeding but is now improved.  He is underwent hemodialysis on 123 and is undergoing hemodialysis again today.  His hypertensive urgency is improved and he does continue to have some volume overload and flash pulmonary edema.  He had a CT of the chest done which showed a high-grade consolidation in the left lower lobe either pneumonia versus atelectasis as well as a hydropneumothorax that appears increased from prior CT scan done in December 2022.  He is seen by [name], CT surgery last hospitalization and case was discussed with Dr. Cyndia Bent from this hospitalization and Dr. Cyndia Bent feels that he has had a complex multiloculated left pleural effusion of several months of duration that was treated with a thoracentesis and pigtail catheter insertion for tPA at last hospitalization and he feels that this patient will never be a surgery candidate so CT surgery feels there is nothing to intervene on.  They feel that basically his lung will never fully re-expand due to scar tissue from his effusions.  Pulmonary Dr. Mortimer Fries was consluted and he felt they could not offer anything at this time and so subsequently Dr. Lanney Gins was consulted for further evaluation and management and to try to help set up CT Surgery at [facility] for Second opinion.  After further discussion with Dr. Lanney Gins he recommended trying to get the patient as dry as possible and recommends aggressive removal of fluid with  dialysis to help with his pleural effusions and then reimage the patient.  Patient was on room air this morning but continues to complain of shortness of breath.  We will dialyze the patient tomorrow and do an ambulatory home O2 screen and if he is stable we will have the patient follow-up with [facility] due to his advanced complex loculated pleural effusion with lung entrapment and ex vacuo physiology.  Patient was dialyzed today and they are going to try a 4-hour treatment today with ultrafiltration goal of 4 L based on pulmonary recommendations to reduce the pulmonary effusion.  They also discussed with the outpatient clinic to times the patient.  Patient continues to be short of breath and will do an amatory home O2 screen and repeat chest x-ray after his dialysis to evaluate his pleural effusion  Assessment & Plan:   Principal Problem:   Volume overload Active Problems:   ESRD on hemodialysis (HCC)   Hypertension   Anemia in ESRD (end-stage renal disease) (HCC)   Pleural effusion   Hypertensive urgency  ESRD on Hemodialysis Monday Wednesday Friday Hyperphosphatemia -Was dialyzed on Monday and was dialyzed again yesterday.  His HD catheter was changed by vascular on 04/18/2021 given that was his malfunctioning -Patient's BUNs/creatinine went from 43/10.12 -> 37/9.53 -> 56/12.99 -> 38/9.22 -> 51/12.40 -We will continue monitor for bleeding and his next treatment is scheduled for Friday and will see if they can go for more aggressive dialysis volume management treatment -Patient's Phos Level was 7.5 -Vascular surgery is following and the next step is to perform the left wrist fistulogram to ensure that  the outflow from the proximal forearm centrally is widely patent and once this is verified they will move forward with revision of his fistula in the operating room -C/w Calcium Acetate 1334 mg p.o. 3 times daily with meals -Further care per Nephro and the plan is to increase his ultrafiltration  with HD today with a goal of 1 L/h at 13 mL/kg/hr' the goal of ultrafiltration tomorrow will be 4 L  Anemia of chronic kidney disease -Nephrology is following and his hemoglobin is below target and dropped from 8.3 to 7.6 yesterday and his hemoglobin/hematocrit is now 7.4/22.2 -> 7.4/22.7 -Currently going to get EPO with dialysis sessions -Continue to monitor for signs and symptoms of bleeding; currently no overt bleeding noted but he did have some yesterday when his [facility] was placed -Patient has had no bleeding today in dialysis went well  Hypertensive Urgency -Was present on admission likely in the setting of volume overload/pulmonary edema -This is resolved now -Blood pressure remains elevated -C/w Home Amlodipine 10 mg po Daily, Carvedilol 25 mg po BID, Hydralazine 100 mg po q8h, and Minoxidil 5 mg po Daily  -Last blood pressure reading was 119/64  Left lower lobe pneumonia with high-grade consolidation versus atelectasis and hydropneumothorax Bilateral Pleural Effusions  -Appears to be increased from CT scan on 03/06/2021 -He seen by CT surgery Dr. Cyndia Bent and Dr. Cyndia Bent felt hey feel that basically his lung will never fully re-expand due to scar tissue from his effusions and felt that CT surgery had nothing else to offer at this time and felt that his pleural biopsy of decortication would be high risk -Dr. Mortimer Fries  was consulted but he recommended getting a second opinion by [facility] surgery at [facility] or [facility] -Have consulted Dr. Lanney Gins for further evaluation and attempt to get him set up with [facility] CT surgery -Dr. Lanney Gins evaluated and the patient was not on oxygen therapy as he subjectively uses it when he feels short of breath.  He feels that the patient will need a second opinion referral but he recommends aggressively diuresing and removing fluid from the patient to help with his pleural effusions and then reimage to see how much lung entrapment the patient actually has if we can get him dry as  possible -SpO2: 100 % O2 Flow Rate (L/min): 3 L/min; patient continues to wear supplemental oxygen via nasal cannula and was back on 3 L today -Chest x-ray from 04/18/2021 showed "No change since yesterday. Bilateral effusions, larger on the left than the right, with multiple air-fluid levels on the left. Associated pulmonary volume loss/infiltrate." -Repeat CXR after dialysis today  Leukocytosis -Patient's WBC went from 7.9 -> 10.7 -Monitor and trend and repeat CBC in the a.m.  GERD/GI Prophylaxis -C/w Pantoprazole 40 mg   DVT prophylaxis: Heparin 5,000 units sq q8h Code Status: FULL CODE  Family Communication: No family present at bedside  Disposition Plan: Pending further clinical Improvement and clearance by specialists   Status is: Inpatient  Remains inpatient appropriate because: He needs further work-up and improvement in his respiratory status prior to safe discharge disposition and clearance by pulmonary as well as vascular surgery  Consultants:  PCCM/Pulmonary Dr. Lanney Gins Discussed the case with Dr. Cyndia Bent of Cardiothoracic Surgery Nephrology Vascular surgery  Procedures: HD  Duration of a tunneled dialysis catheter of the right IJ with the same venous approach by Dr. Delana Meyer on 04/18/21  Antimicrobials:  Anti-infectives (From admission, onward)    Start     Dose/Rate Route Frequency Ordered Stop  04/19/21 0600  ceFAZolin (ANCEF) IVPB 2g/100 mL premix        2 g 200 mL/hr over 30 Minutes Intravenous On call to O.R. 04/18/21 1203 04/20/21 0559   04/18/21 1441  ceFAZolin (ANCEF) 1-4 GM/50ML-% IVPB       Note to Pharmacy: Corlis Hove H: cabinet override      04/18/21 1441 04/19/21 0244        Subjective: Seen and examined at bedside in the dialysis unit and he states that he is not feeling as well today and continue to complain of a headache as well as some abdominal discomfort.  States that he is also short of breath and needs his oxygen.  States he is  very weak today.  Denies any other concerns or complaints at this time.  Objective: Vitals:   04/21/21 1440 04/21/21 1449 04/21/21 1452 04/21/21 1527  BP:    119/64  Pulse: 99 99 99 99  Resp: (!) 25 (!) 24  18  Temp:    99 F (37.2 C)  TempSrc:    Oral  SpO2: 97% 98% 96% 100%  Weight: 77.8 kg     Height:        Intake/Output Summary (Last 24 hours) at 04/21/2021 1630 Last data filed at 04/21/2021 1431 Gross per 24 hour  Intake 240 ml  Output 4501 ml  Net -4261 ml    Filed Weights   04/19/21 1345 04/21/21 0945 04/21/21 1440  Weight: 80.9 kg 81.4 kg 77.8 kg   Examination: Physical Exam:  Constitutional: WN/WD overweight African-American male currently no acute distress appears calm but uncomfortable laying in the dialysis unit Eyes: Lids and conjunctivae normal, sclerae anicteric  ENMT: External Ears, Nose appear normal. Grossly normal hearing. Mucous membranes are moist.  Neck: Appears normal, supple, no cervical masses, normal ROM, no appreciable thyromegaly; no appreciable JVD Respiratory: Diminished to auscultation bilaterally with coarse breath sounds and some crackles noted.  Worse on the left compared to the right, no wheezing, rales, rhonchi or crackles. Normal respiratory effort and patient is not tachypenic. No accessory muscle use.  Wearing supplemental oxygen via nasal cannula Cardiovascular: Tachycardic rate but regular rhythm.  Mild extremity edema.  Abdomen: Soft, non-tender, non-distended. Bowel sounds positive.  GU: Deferred. Musculoskeletal: No clubbing / cyanosis of digits/nails. No joint deformity upper and lower extremities. Skin: No rashes, lesions, ulcers on limited skin evaluation. No induration; Warm and dry.  Neurologic: CN 2-12 grossly intact with no focal deficits. Romberg sign and cerebellar reflexes not assessed.  Psychiatric: Normal judgment and insight. Alert and oriented x 3.  Little anxious mood and appropriate affect.   Data Reviewed: I have  personally reviewed following labs and imaging studies  CBC: Recent Labs  Lab 04/17/21 1026 04/19/21 0925 04/20/21 0543 04/21/21 0617  WBC 7.2 7.9 7.9 10.7*  NEUTROABS  --  5.4 5.0 8.2*  HGB 8.3* 7.6* 7.4* 7.4*  HCT 25.1* 22.9* 22.2* 22.7*  MCV 94.4 94.6 94.1 95.8  PLT 256 247 265 242    Basic Metabolic Panel: Recent Labs  Lab 04/17/21 1026 04/18/21 0538 04/19/21 0925 04/20/21 0543 04/21/21 0617  NA 133* 134* 133* 132* 130*  K 4.0 4.6 5.0 4.3 4.8  CL 95* 96* 93* 93* 87*  CO2 25 27 25 27 28   GLUCOSE 81 87 108* 84 115*  BUN 43* 37* 56* 38* 51*  CREATININE 10.12* 9.53* 12.99* 9.22* 12.40*  CALCIUM 8.9 8.7* 9.0 8.9 8.7*  MG  --   --  1.9 1.7 1.7  PHOS  --   --  7.5* 6.1* 6.7*    GFR: Estimated Creatinine Clearance: 8.5 mL/min (A) (by C-G formula based on SCr of 12.4 mg/dL (H)). Liver Function Tests: Recent Labs  Lab 04/19/21 0925 04/20/21 0543 04/21/21 0617  AST 11* 10* 13*  ALT 7 7 6   ALKPHOS 55 44 51  BILITOT 0.5 0.2* 0.4  PROT 8.0 7.9 8.0  ALBUMIN 3.3* 3.2* 3.2*    No results for input(s): LIPASE, AMYLASE in the last 168 hours. No results for input(s): AMMONIA in the last 168 hours. Coagulation Profile: No results for input(s): INR, PROTIME in the last 168 hours. Cardiac Enzymes: No results for input(s): CKTOTAL, CKMB, CKMBINDEX, TROPONINI in the last 168 hours. BNP (last 3 results) No results for input(s): PROBNP in the last 8760 hours. HbA1C: No results for input(s): HGBA1C in the last 72 hours. CBG: No results for input(s): GLUCAP in the last 168 hours. Lipid Profile: No results for input(s): CHOL, HDL, LDLCALC, TRIG, CHOLHDL, LDLDIRECT in the last 72 hours. Thyroid Function Tests: No results for input(s): TSH, T4TOTAL, FREET4, T3FREE, THYROIDAB in the last 72 hours. Anemia Panel: No results for input(s): VITAMINB12, FOLATE, FERRITIN, TIBC, IRON, RETICCTPCT in the last 72 hours. Sepsis Labs: No results for input(s): PROCALCITON, LATICACIDVEN  in the last 168 hours.  Recent Results (from the past 240 hour(s))  Resp Panel by RT-PCR (Flu A&B, Covid) Nasopharyngeal Swab     Status: None   Collection Time: 04/17/21  1:00 PM   Specimen: Nasopharyngeal Swab; Nasopharyngeal(NP) swabs in vial transport medium  Result Value Ref Range Status   SARS Coronavirus 2 by RT PCR NEGATIVE NEGATIVE Final    Comment: (NOTE) SARS-CoV-2 target nucleic acids are NOT DETECTED.  The SARS-CoV-2 RNA is generally detectable in upper respiratory specimens during the acute phase of infection. The lowest concentration of SARS-CoV-2 viral copies this assay can detect is 138 copies/mL. A negative result does not preclude SARS-Cov-2 infection and should not be used as the sole basis for treatment or other patient management decisions. A negative result may occur with  improper specimen collection/handling, submission of specimen other than nasopharyngeal swab, presence of viral mutation(s) within the areas targeted by this assay, and inadequate number of viral copies(<138 copies/mL). A negative result must be combined with clinical observations, patient history, and epidemiological information. The expected result is Negative.  Fact Sheet for Patients:  EntrepreneurPulse.com.au  Fact Sheet for Healthcare Providers:  IncredibleEmployment.be  This test is no t yet approved or cleared by the [place] FDA and  has been authorized for detection and/or diagnosis of SARS-CoV-2 by FDA under an Emergency Use Authorization (EUA). This EUA will remain  in effect (meaning this test can be used) for the duration of the COVID-19 declaration under Section 564(b)(1) of the Act, 21 U.S.C.section 360bbb-3(b)(1), unless the authorization is terminated  or revoked sooner.       Influenza A by PCR NEGATIVE NEGATIVE Final   Influenza B by PCR NEGATIVE NEGATIVE Final    Comment: (NOTE) The Xpert Xpress SARS-CoV-2/FLU/RSV plus  assay is intended as an aid in the diagnosis of influenza from Nasopharyngeal swab specimens and should not be used as a sole basis for treatment. Nasal washings and aspirates are unacceptable for Xpert Xpress SARS-CoV-2/FLU/RSV testing.  Fact Sheet for Patients: EntrepreneurPulse.com.au  Fact Sheet for Healthcare Providers: IncredibleEmployment.be  This test is not yet approved or cleared by the [place] and has been authorized for  detection and/or diagnosis of SARS-CoV-2 by FDA under an Emergency Use Authorization (EUA). This EUA will remain in effect (meaning this test can be used) for the duration of the COVID-19 declaration under Section 564(b)(1) of the Act, 21 U.S.C. section 360bbb-3(b)(1), unless the authorization is terminated or revoked.  Performed at [facility], [address] [place], [place] [address]   MRSA Next Gen by PCR, Nasal     Status: None   Collection Time: 04/21/21  8:50 AM   Specimen: Nasal Mucosa; Nasal Swab  Result Value Ref Range Status   MRSA by PCR Next Gen NOT DETECTED NOT DETECTED Final    Comment: (NOTE) The GeneXpert MRSA Assay (FDA approved for NASAL specimens only), is one component of a comprehensive MRSA colonization surveillance program. It is not intended to diagnose MRSA infection nor to guide or monitor treatment for MRSA infections. Test performance is not FDA approved in patients less than 53 years old. Performed at [facility], [address] [place], [place] [address]       RN Pressure Injury Documentation:     Estimated body mass index is 25.33 kg/m as calculated from the following:   Height as of this encounter: 5\' 9"  (1.753 m).   Weight as of this encounter: 77.8 kg.  Malnutrition Type:   Malnutrition Characteristics:   Nutrition Interventions:    Radiology Studies: No results found.  Scheduled Meds:  amLODipine  10 mg Oral Daily   aspirin   325 mg Oral Daily   calcium acetate  1,334 mg Oral TID WC   carvedilol  25 mg Oral BID WC   Chlorhexidine Gluconate Cloth  6 each Topical Q0600   epoetin alfa       epoetin (EPOGEN/PROCRIT) injection  10,000 Units Intravenous Q M,W,F-HD   heparin  5,000 Units Subcutaneous Q8H   heparin sodium (porcine)       hydrALAZINE  100 mg Oral Q8H   minoxidil  5 mg Oral Daily   pantoprazole  40 mg Oral Daily   sodium chloride flush  3 mL Intravenous Q12H   Continuous Infusions:  sodium chloride     sodium chloride     sodium chloride      LOS: 3 days   [place], DO [facility] PAGER is on AMION  If 7PM-7AM, please contact night-coverage [email or web address]

## 2021-04-21 NOTE — Progress Notes (Signed)
Pt BP LOW, RN NOTIFIED

## 2021-04-21 NOTE — Progress Notes (Signed)
Pt vomited after breakfast 500cc

## 2021-04-21 NOTE — Progress Notes (Signed)
[facility]  ROUNDING NOTE   Subjective:   Travis Long is a 36 year old African-American male with past medical concerns including hypertension and end-stage renal disease on dialysis.  Patient presents to the emergency department from his outpatient dialysis center with complaints of shortness of breath.  Patient will be admitted under observation for Acute pulmonary edema ([place]) [J81.0] Volume overload [E87.70]  Patient is known to our clinic and receives outpatient dialysis treatments at [facility], supervised by Dr. Holley Raring.  Patient receives dialysis on a MWF schedule.    Patient seen and evaluated during dialysis   HEMODIALYSIS FLOWSHEET:  Blood Flow Rate (mL/min): 400 mL/min Arterial Pressure (mmHg): -170 mmHg Venous Pressure (mmHg): 120 mmHg Transmembrane Pressure (mmHg): 80 mmHg Ultrafiltration Rate (mL/min): 1130 mL/min Dialysate Flow Rate (mL/min): 500 ml/min Conductivity: Machine : 13.8 Conductivity: Machine : 13.8 Dialysis Fluid Bolus: Normal Saline Bolus Amount (mL): 250 mL  No complaints at this time.  Objective:  Vital signs in last 24 hours:  Temp:  [98 F (36.7 C)-99.4 F (37.4 C)] 99.3 F (37.4 C) (01/27 0945) Pulse Rate:  [94-103] 96 (01/27 1245) Resp:  [18-36] 32 (01/27 1245) BP: (109-157)/(50-103) 115/50 (01/27 1245) SpO2:  [97 %-100 %] 100 % (01/27 1245) Weight:  [81.4 kg] 81.4 kg (01/27 0945)  Weight change:  Filed Weights   04/19/21 1030 04/19/21 1345 04/21/21 0945  Weight: 82.9 kg 80.9 kg 81.4 kg    Intake/Output: I/O last 3 completed shifts: In: 480 [P.O.:480] Out: -    Intake/Output this shift:  Total I/O In: -  Out: 500 [Emesis/NG output:500]  Physical Exam: General: NAD, resting in bed  Head: Normocephalic, atraumatic. Moist oral mucosal membranes  Eyes: Anicteric  Lungs:  Diminished on left, normal effort, 2 L [place]  Heart: Regular rate and rhythm  Abdomen:  Soft, nontender  Extremities:  No peripheral  edema.  Neurologic: Nonfocal, moving all four extremities  Skin: No lesions  Access: Right IJ PermCath, Lt lower AVF    Basic Metabolic Panel: Recent Labs  Lab 04/17/21 1026 04/18/21 0538 04/19/21 0925 04/20/21 0543 04/21/21 0617  NA 133* 134* 133* 132* 130*  K 4.0 4.6 5.0 4.3 4.8  CL 95* 96* 93* 93* 87*  CO2 25 27 25 27 28   GLUCOSE 81 87 108* 84 115*  BUN 43* 37* 56* 38* 51*  CREATININE 10.12* 9.53* 12.99* 9.22* 12.40*  CALCIUM 8.9 8.7* 9.0 8.9 8.7*  MG  --   --  1.9 1.7 1.7  PHOS  --   --  7.5* 6.1* 6.7*     Liver Function Tests: Recent Labs  Lab 04/19/21 0925 04/20/21 0543 04/21/21 0617  AST 11* 10* 13*  ALT 7 7 6   ALKPHOS 55 44 51  BILITOT 0.5 0.2* 0.4  PROT 8.0 7.9 8.0  ALBUMIN 3.3* 3.2* 3.2*    No results for input(s): LIPASE, AMYLASE in the last 168 hours. No results for input(s): AMMONIA in the last 168 hours.  CBC: Recent Labs  Lab 04/17/21 1026 04/19/21 0925 04/20/21 0543 04/21/21 0617  WBC 7.2 7.9 7.9 10.7*  NEUTROABS  --  5.4 5.0 8.2*  HGB 8.3* 7.6* 7.4* 7.4*  HCT 25.1* 22.9* 22.2* 22.7*  MCV 94.4 94.6 94.1 95.8  PLT 256 247 265 273     Cardiac Enzymes: No results for input(s): CKTOTAL, CKMB, CKMBINDEX, TROPONINI in the last 168 hours.  BNP: Invalid input(s): POCBNP  CBG: No results for input(s): GLUCAP in the last 168 hours.  Microbiology: Results  for orders placed or performed during the hospital encounter of 04/17/21  Resp Panel by RT-PCR (Flu A&B, Covid) Nasopharyngeal Swab     Status: None   Collection Time: 04/17/21  1:00 PM   Specimen: Nasopharyngeal Swab; Nasopharyngeal(NP) swabs in vial transport medium  Result Value Ref Range Status   SARS Coronavirus 2 by RT PCR NEGATIVE NEGATIVE Final    Comment: (NOTE) SARS-CoV-2 target nucleic acids are NOT DETECTED.  The SARS-CoV-2 RNA is generally detectable in upper respiratory specimens during the acute phase of infection. The lowest concentration of SARS-CoV-2 viral copies  this assay can detect is 138 copies/mL. A negative result does not preclude SARS-Cov-2 infection and should not be used as the sole basis for treatment or other patient management decisions. A negative result may occur with  improper specimen collection/handling, submission of specimen other than nasopharyngeal swab, presence of viral mutation(s) within the areas targeted by this assay, and inadequate number of viral copies(<138 copies/mL). A negative result must be combined with clinical observations, patient history, and epidemiological information. The expected result is Negative.  Fact Sheet for Patients:  EntrepreneurPulse.com.au  Fact Sheet for Healthcare Providers:  IncredibleEmployment.be  This test is no t yet approved or cleared by the [place] FDA and  has been authorized for detection and/or diagnosis of SARS-CoV-2 by FDA under an Emergency Use Authorization (EUA). This EUA will remain  in effect (meaning this test can be used) for the duration of the COVID-19 declaration under Section 564(b)(1) of the Act, 21 U.S.C.section 360bbb-3(b)(1), unless the authorization is terminated  or revoked sooner.       Influenza A by PCR NEGATIVE NEGATIVE Final   Influenza B by PCR NEGATIVE NEGATIVE Final    Comment: (NOTE) The Xpert Xpress SARS-CoV-2/FLU/RSV plus assay is intended as an aid in the diagnosis of influenza from Nasopharyngeal swab specimens and should not be used as a sole basis for treatment. Nasal washings and aspirates are unacceptable for Xpert Xpress SARS-CoV-2/FLU/RSV testing.  Fact Sheet for Patients: EntrepreneurPulse.com.au  Fact Sheet for Healthcare Providers: IncredibleEmployment.be  This test is not yet approved or cleared by the [place] FDA and has been authorized for detection and/or diagnosis of SARS-CoV-2 by FDA under an Emergency Use Authorization (EUA). This EUA  will remain in effect (meaning this test can be used) for the duration of the COVID-19 declaration under Section 564(b)(1) of the Act, 21 U.S.C. section 360bbb-3(b)(1), unless the authorization is terminated or revoked.  Performed at [facility], [address] [place], [place] [address]   MRSA Next Gen by PCR, Nasal     Status: None   Collection Time: 04/21/21  8:50 AM   Specimen: Nasal Mucosa; Nasal Swab  Result Value Ref Range Status   MRSA by PCR Next Gen NOT DETECTED NOT DETECTED Final    Comment: (NOTE) The GeneXpert MRSA Assay (FDA approved for NASAL specimens only), is one component of a comprehensive MRSA colonization surveillance program. It is not intended to diagnose MRSA infection nor to guide or monitor treatment for MRSA infections. Test performance is not FDA approved in patients less than 44 years old. Performed at [facility], [address] [place], [place] [address]     Coagulation Studies: No results for input(s): LABPROT, INR in the last 72 hours.  Urinalysis: No results for input(s): COLORURINE, LABSPEC, PHURINE, GLUCOSEU, HGBUR, BILIRUBINUR, KETONESUR, PROTEINUR, UROBILINOGEN, NITRITE, LEUKOCYTESUR in the last 72 hours.  Invalid input(s): APPERANCEUR    Imaging: No results found.  Medications:    sodium chloride     sodium chloride     sodium chloride      amLODipine  10 mg Oral Daily   aspirin  325 mg Oral Daily   calcium acetate  1,334 mg Oral TID WC   carvedilol  25 mg Oral BID WC   Chlorhexidine Gluconate Cloth  6 each Topical Q0600   heparin  5,000 Units Subcutaneous Q8H   heparin sodium (porcine)       hydrALAZINE  100 mg Oral Q8H   minoxidil  5 mg Oral Daily   pantoprazole  40 mg Oral Daily   sodium chloride flush  3 mL Intravenous Q12H     Assessment/ Plan:  Travis Long is a 36 y.o.  male past medical concerns including hypertension and end-stage renal disease on dialysis.  Patient presents to  the emergency department from his outpatient dialysis center with complaints of shortness of breath.  Patient will be admitted under observation for Acute pulmonary edema ([facility]) [J81.0] Volume overload [E87.70]  [facility]/MWF/right IJ PermCath  Acute respiratory distress with end-stage renal disease on dialysis  Appreciate vascular surgery exchange of right PermCath on 04/18/21.  Patient currently receiving dialysis.  Has agreed to 4-hour treatment with UF goal of 4 L based on pulmonology recommendations to reduce pulmonary effusion.  Discussed with outpatient clinic the need to challenge patient 0.5 to 1 L with treatment.  Patient cleared to discharge from renal stance.  Next treatment scheduled for Monday.  2. Anemia of chronic kidney disease  Lab Results  Component Value Date   HGB 7.4 (L) 04/21/2021  Continue EPO with dialysis  3. Secondary Hyperparathyroidism:   Lab Results  Component Value Date   CALCIUM 8.7 (L) 04/21/2021   PHOS 6.7 (H) 04/21/2021  We will continue to monitor bone minerals  Continue calcium acetate, 2 capsules with meals.  4.  Hypertension with chronic kidney disease.  Home regimen includes amlodipine, carvedilol, minoxidil, and hydralazine.  All currently prescribed at this time.  BP 122/66 during dialysis  5.  Bilateral pleural effusions with left lung entrapment Pulmonology following.  Recommend increase fluid removal with dialysis.    LOS: 3   1/27/20231:02 PM

## 2021-04-22 LAB — COMPREHENSIVE METABOLIC PANEL
ALT: 6 U/L (ref 0–44)
AST: 11 U/L — ABNORMAL LOW (ref 15–41)
Albumin: 3.4 g/dL — ABNORMAL LOW (ref 3.5–5.0)
Alkaline Phosphatase: 55 U/L (ref 38–126)
Anion gap: 15 (ref 5–15)
BUN: 28 mg/dL — ABNORMAL HIGH (ref 6–20)
CO2: 29 mmol/L (ref 22–32)
Calcium: 9.3 mg/dL (ref 8.9–10.3)
Chloride: 90 mmol/L — ABNORMAL LOW (ref 98–111)
Creatinine, Ser: 8.07 mg/dL — ABNORMAL HIGH (ref 0.61–1.24)
GFR, Estimated: 8 mL/min — ABNORMAL LOW (ref >60)
Glucose, Bld: 98 mg/dL (ref 70–99)
Potassium: 4.4 mmol/L (ref 3.5–5.1)
Sodium: 134 mmol/L — ABNORMAL LOW (ref 135–145)
Total Bilirubin: 0.4 mg/dL (ref 0.3–1.2)
Total Protein: 8.3 g/dL — ABNORMAL HIGH (ref 6.5–8.1)

## 2021-04-22 LAB — CBC WITH DIFFERENTIAL/PLATELET
Abs Immature Granulocytes: 0.03 10*3/uL (ref 0.00–0.07)
Basophils Absolute: 0 10*3/uL (ref 0.0–0.1)
Basophils Relative: 0 %
Eosinophils Absolute: 0.7 10*3/uL — ABNORMAL HIGH (ref 0.0–0.5)
Eosinophils Relative: 7 %
HCT: 25 % — ABNORMAL LOW (ref 39.0–52.0)
Hemoglobin: 8 g/dL — ABNORMAL LOW (ref 13.0–17.0)
Immature Granulocytes: 0 %
Lymphocytes Relative: 9 %
Lymphs Abs: 0.8 10*3/uL (ref 0.7–4.0)
MCH: 30.1 pg (ref 26.0–34.0)
MCHC: 32 g/dL (ref 30.0–36.0)
MCV: 94 fL (ref 80.0–100.0)
Monocytes Absolute: 1.4 10*3/uL — ABNORMAL HIGH (ref 0.1–1.0)
Monocytes Relative: 15 %
Neutro Abs: 6.5 10*3/uL (ref 1.7–7.7)
Neutrophils Relative %: 69 %
Platelets: 306 10*3/uL (ref 150–400)
RBC: 2.66 MIL/uL — ABNORMAL LOW (ref 4.22–5.81)
RDW: 14.6 % (ref 11.5–15.5)
WBC: 9.4 10*3/uL (ref 4.0–10.5)
nRBC: 0 % (ref 0.0–0.2)

## 2021-04-22 LAB — MAGNESIUM: Magnesium: 1.7 mg/dL (ref 1.7–2.4)

## 2021-04-22 LAB — PHOSPHORUS: Phosphorus: 6 mg/dL — ABNORMAL HIGH (ref 2.5–4.6)

## 2021-04-22 MED ORDER — IPRATROPIUM BROMIDE 0.02 % IN SOLN
0.5000 mg | Freq: Four times a day (QID) | RESPIRATORY_TRACT | Status: DC
  Administered 2021-04-22 – 2021-04-23 (×2): 0.5 mg via RESPIRATORY_TRACT
  Filled 2021-04-22 (×2): qty 2.5

## 2021-04-22 MED ORDER — LEVALBUTEROL HCL 0.63 MG/3ML IN NEBU
0.6300 mg | INHALATION_SOLUTION | Freq: Four times a day (QID) | RESPIRATORY_TRACT | Status: DC
  Administered 2021-04-22 – 2021-04-23 (×2): 0.63 mg via RESPIRATORY_TRACT
  Filled 2021-04-22 (×5): qty 3

## 2021-04-22 MED ORDER — GUAIFENESIN ER 600 MG PO TB12
1200.0000 mg | ORAL_TABLET | Freq: Two times a day (BID) | ORAL | Status: DC
  Filled 2021-04-22 (×3): qty 2

## 2021-04-22 NOTE — Progress Notes (Signed)
Pt called out to see nurse, upon entering the room pt noted to have removed his perm cath dressing, states "I had to take it off because I didn't like how it felt, I don't like the dried blood on the gauze, it just don't feel or look right, I want a new dressing", explained to the pt that doing dressing changes are sterile procedures and that he is taking a risk of getting an infection by removing the dressing and touching the insertion site, despite education pt states "I know that, but I don't like seeing the dressing like that, I wish that I had never got this thing", placed a new dressing per policy and procedure

## 2021-04-22 NOTE — Progress Notes (Signed)
PROGRESS NOTE    Travis Long  OEV:[id number] DOB: 1985-08-03 DOA: 04/17/2021 PCP: Pcp, No  Brief Narrative:  Patient is a 36 year old African-American male with past medical history significant for but not limited to ESRD on hemodialysis Monday Wednesday Friday, hypertension as well as other comorbidities who presented with pulmonary edema and volume overload with HD catheter malfunction.  On 04/17/2020 EKG was performed and 04/18/2021 is right IJ tunneled catheter was exchanged by vascular.  After that he had some pulsatile bleeding but is now improved.  He is underwent hemodialysis on 123 and is undergoing hemodialysis again today.  His hypertensive urgency is improved and he does continue to have some volume overload and flash pulmonary edema.  He had a CT of the chest done which showed a high-grade consolidation in the left lower lobe either pneumonia versus atelectasis as well as a hydropneumothorax that appears increased from prior CT scan done in December 2022.  He is seen by [name], CT surgery last hospitalization and case was discussed with Dr. Cyndia Bent from this hospitalization and Dr. Cyndia Bent feels that he has had a complex multiloculated left pleural effusion of several months of duration that was treated with a thoracentesis and pigtail catheter insertion for tPA at last hospitalization and he feels that this patient will never be a surgery candidate so CT surgery feels there is nothing to intervene on.  They feel that basically his lung will never fully re-expand due to scar tissue from his effusions.  Pulmonary Dr. Mortimer Fries was consluted and he felt they could not offer anything at this time and so subsequently Dr. Lanney Gins was consulted for further evaluation and management and to try to help set up CT Surgery at [facility] for Second opinion.  After further discussion with Dr. Lanney Gins he recommended trying to get the patient as dry as possible and recommends aggressive removal of fluid with  dialysis to help with his pleural effusions and then reimage the patient.  Patient was on room air this morning but continues to complain of shortness of breath.  We will dialyze the patient tomorrow and do an ambulatory home O2 screen and if he is stable we will have the patient follow-up with [facility] due to his advanced complex loculated pleural effusion with lung entrapment and ex vacuo physiology.  Patient was dialyzed yesterday and they are going to try a 4-hour treatment today with ultrafiltration goal of 4 L based on pulmonary recommendations to reduce the pulmonary effusion.  They also discussed with the outpatient clinic to times the patient.  Patient continues to be short of breath and will do an amatory home O2 screen and repeat chest x-ray after his dialysis to evaluate his pleural effusion repeat chest x-ray done and showed that his right-sided improved however his left side still has a very large pleural effusion.  Patient continues to complain of dyspnea and was able to ambulate without desaturating however ambulating up the stairs he desaturated to 86% and needed at least 3 L to remain above 90.  We will add flutter valve and incentive spirometry as well as Xopenex and Atrovent  Assessment & Plan:   Principal Problem:   Volume overload Active Problems:   ESRD on hemodialysis (HCC)   Hypertension   Anemia in ESRD (end-stage renal disease) (HCC)   Pleural effusion   Hypertensive urgency  ESRD on Hemodialysis Monday Wednesday Friday Hyperphosphatemia -Was dialyzed on Monday and was dialyzed again yesterday.  His HD catheter was changed by vascular on 04/18/2021  given that was his malfunctioning -Patient's BUNs/creatinine went from 43/10.12 -> 37/9.53 -> 56/12.99 -> 38/9.22 -> 51/12.40 and is now 28/0.87 -We will continue monitor for bleeding and he has had some dried blood from his temporary dialysis catheter.  He was dialyzed yesterday and had increased ultrafiltration treatment of 4  L. -Patient's Phos Level was 7.5 and is improved to 6.0 -Vascular surgery is following and the next step is to perform the left wrist fistulogram to ensure that the outflow from the proximal forearm centrally is widely patent and once this is verified they will move forward with revision of his fistula in the operating room -C/w Calcium Acetate 1334 mg p.o. 3 times daily with meals -Further care per Nephro and the plan is to increase his ultrafiltration with HD with a goal of 1 L/h at 13 mL/kg/hr' the goal of ultrafiltration will be 4 L MI: Next scheduled dialysis session is on Monday  Anemia of Chronic Kidney Disease -Nephrology is following and his hemoglobin is below target and dropped from 8.3 to 7.6 yesterday and his hemoglobin/hematocrit is now 7.4/22.2 -> 7.4/22.7 and is now improved to 8.0/25.0 -Currently going to get EPO with dialysis sessions -Continue to monitor for signs and symptoms of bleeding; currently no overt bleeding noted but he did have some yesterday when his [facility] was placed -Patient has had no bleeding in dialysis but has some dried blood around his [facility]  Hypertensive Urgency -Was present on admission likely in the setting of volume overload/pulmonary edema -This is resolved now -Blood pressure remains elevated -C/w Home Amlodipine 10 mg po Daily, Carvedilol 25 mg po BID, Hydralazine 100 mg po q8h, and Minoxidil 5 mg po Daily  -Last blood pressure reading was 125/64  Left lower lobe pneumonia with high-grade consolidation versus atelectasis and hydropneumothorax Bilateral Pleural Effusions  -Appears to be increased from CT scan on 03/06/2021 -He seen by CT surgery Dr. Cyndia Bent and Dr. Cyndia Bent felt hey feel that basically his lung will never fully re-expand due to scar tissue from his effusions and felt that CT surgery had nothing else to offer at this time and felt that his pleural biopsy of decortication would be high risk -Dr. Mortimer Fries  was consulted but he recommended getting  a second opinion by CT surgery at [facility] or [facility] -Have consulted Dr. Lanney Gins for further evaluation and attempt to get him set up with [facility] CT surgery -Dr. Lanney Gins evaluated and the patient was not on oxygen therapy as he subjectively uses it when he feels short of breath.  He feels that the patient will need a second opinion referral but he recommends aggressively diuresing and removing fluid from the patient to help with his pleural effusions and then reimage to see how much lung entrapment the patient actually has if we can get him dry as possible -SpO2: 94 % O2 Flow Rate (L/min): 3 L/min; patient continues to wear supplemental oxygen via nasal cannula and was back on 3 L today -Chest x-ray from 04/18/2021 showed "No change since yesterday. Bilateral effusions, larger on the left than the right, with multiple air-fluid levels on the left. Associated pulmonary volume loss/infiltrate." -Repeat CXR after dialysis done yesterday and showed "There is interval improvement in aeration of right lower lung fields suggesting decrease in atelectasis/pneumonia. Bilateral pleural effusions, more so on the left side. There are patchy densities in the left mid and left lower lung fields suggesting atelectasis/pneumonia." -Ambulatory home O2 screen done today and he did desaturate going up the stairs  and will require supplemental oxygen 3 L so we have written this order -We will place him on Xopenex and Atrovent and continued guaifenesin, and flutter valve, incentive spirometry  Leukocytosis -Patient's WBC went from 7.9 -> 10.7 -> 9.4 -Monitor and trend and repeat CBC in the a.m.  GERD/GI Prophylaxis -C/w Pantoprazole 40 mg   DVT prophylaxis: Heparin 5,000 units sq q8h Code Status: FULL CODE  Family Communication: No family present at bedside  Disposition Plan: Pending further clinical Improvement and clearance by specialists   Status is: Inpatient  Remains inpatient appropriate because: He needs further  work-up and improvement in his respiratory status prior to safe discharge disposition and clearance by pulmonary as well as vascular surgery  Consultants:  PCCM/Pulmonary Dr. Lanney Gins Discussed the case with Dr. Cyndia Bent of Cardiothoracic Surgery Nephrology Vascular surgery  Procedures: HD  Duration of a tunneled dialysis catheter of the right IJ with the same venous approach by Dr. Delana Meyer on 04/18/21  Antimicrobials:  Anti-infectives (From admission, onward)    Start     Dose/Rate Route Frequency Ordered Stop   04/19/21 0600  ceFAZolin (ANCEF) IVPB 2g/100 mL premix        2 g 200 mL/hr over 30 Minutes Intravenous On call to O.R. 04/18/21 1203 04/20/21 0559   04/18/21 1441  ceFAZolin (ANCEF) 1-4 GM/50ML-% IVPB       Note to Pharmacy: Corlis Hove H: cabinet override      04/18/21 1441 04/19/21 0244        Subjective: Seen and examined at bedside and he states that he still not feeling as well and continues to be short of breath.  Was concerned that his [facility] was bleeding today and he did have some dried blood on his dressing per the nurse.  Ambulated and did not desaturate but when he went up the stairs he continues to desaturate and so will require home oxygen given that he has 17 steps to go up to get to get to his bedroom.  Still remains volume overloaded so we will start him on Xopenex and Atrovent and order a flutter valve and incentive spirometer.  Nephrology recommending no dialysis today and recommends continuing dialysis schedule.  No other concerns or complaints at this time but does have a headache.  Objective: Vitals:   04/21/21 2019 04/22/21 0740 04/22/21 1154 04/22/21 1604  BP: (!) 116/47 (!) 142/81 128/64 125/64  Pulse: 94 91 88 91  Resp: 18 19 17 17   Temp:  98.2 F (36.8 C) 97.8 F (36.6 C) 97.8 F (36.6 C)  TempSrc: Oral     SpO2: 95% 100% 96% 94%  Weight:      Height:        Intake/Output Summary (Last 24 hours) at 04/22/2021 1710 Last data filed at  04/22/2021 1300 Gross per 24 hour  Intake 720 ml  Output --  Net 720 ml    Filed Weights   04/19/21 1345 04/21/21 0945 04/21/21 1440  Weight: 80.9 kg 81.4 kg 77.8 kg   Examination: Physical Exam:  Constitutional: WN/WD overweight African-American male currently in no acute distress but appears fatigued and feels generally unwell Eyes: Lids and conjunctivae normal, sclerae anicteric  ENMT: External Ears, Nose appear normal. Grossly normal hearing. Mucous membranes are moist.   Neck: Appears normal, supple, no cervical masses, normal ROM, no appreciable thyromegaly; no appreciable JVD Respiratory: Diminished to auscultation bilaterally with coarse breath sounds and has some crackles worse on the left compared to the right and some  slight rhonchi.  No appreciable wheezing or rales. Normal respiratory effort and patient is not tachypenic. No accessory muscle use.  And was not wearing any supplemental oxygen currently but then when he ambulated and went up the stairs desaturated and needed oxygen Cardiovascular: RRR, no murmurs / rubs / gallops. S1 and S2 auscultated.  Mild 1+ extremity edema Abdomen: Soft, non-tender, distended secondary body habitus.  Bowel sounds positive.  GU: Deferred. Musculoskeletal: No clubbing / cyanosis of digits/nails. No joint deformity upper and lower extremities.  Has a right TDC in place Skin: No rashes, lesions, ulcers on limited skin evaluation. No induration; Warm and dry.  Neurologic: CN 2-12 grossly intact with no focal deficits. Romberg sign cerebellar reflexes not assessed.  Psychiatric: Normal judgment and insight. Alert and oriented x 3. Normal mood and appropriate affect.   Data Reviewed: I have personally reviewed following labs and imaging studies  CBC: Recent Labs  Lab 04/17/21 1026 04/19/21 0925 04/20/21 0543 04/21/21 0617 04/22/21 0544  WBC 7.2 7.9 7.9 10.7* 9.4  NEUTROABS  --  5.4 5.0 8.2* 6.5  HGB 8.3* 7.6* 7.4* 7.4* 8.0*  HCT 25.1*  22.9* 22.2* 22.7* 25.0*  MCV 94.4 94.6 94.1 95.8 94.0  PLT 256 247 265 273 101    Basic Metabolic Panel: Recent Labs  Lab 04/18/21 0538 04/19/21 0925 04/20/21 0543 04/21/21 0617 04/22/21 0544  NA 134* 133* 132* 130* 134*  K 4.6 5.0 4.3 4.8 4.4  CL 96* 93* 93* 87* 90*  CO2 27 25 27 28 29   GLUCOSE 87 108* 84 115* 98  BUN 37* 56* 38* 51* 28*  CREATININE 9.53* 12.99* 9.22* 12.40* 8.07*  CALCIUM 8.7* 9.0 8.9 8.7* 9.3  MG  --  1.9 1.7 1.7 1.7  PHOS  --  7.5* 6.1* 6.7* 6.0*    GFR: Estimated Creatinine Clearance: 13 mL/min (A) (by C-G formula based on SCr of 8.07 mg/dL (H)). Liver Function Tests: Recent Labs  Lab 04/19/21 0925 04/20/21 0543 04/21/21 0617 04/22/21 0544  AST 11* 10* 13* 11*  ALT 7 7 6 6   ALKPHOS 55 44 51 55  BILITOT 0.5 0.2* 0.4 0.4  PROT 8.0 7.9 8.0 8.3*  ALBUMIN 3.3* 3.2* 3.2* 3.4*    No results for input(s): LIPASE, AMYLASE in the last 168 hours. No results for input(s): AMMONIA in the last 168 hours. Coagulation Profile: No results for input(s): INR, PROTIME in the last 168 hours. Cardiac Enzymes: No results for input(s): CKTOTAL, CKMB, CKMBINDEX, TROPONINI in the last 168 hours. BNP (last 3 results) No results for input(s): PROBNP in the last 8760 hours. HbA1C: No results for input(s): HGBA1C in the last 72 hours. CBG: No results for input(s): GLUCAP in the last 168 hours. Lipid Profile: No results for input(s): CHOL, HDL, LDLCALC, TRIG, CHOLHDL, LDLDIRECT in the last 72 hours. Thyroid Function Tests: No results for input(s): TSH, T4TOTAL, FREET4, T3FREE, THYROIDAB in the last 72 hours. Anemia Panel: No results for input(s): VITAMINB12, FOLATE, FERRITIN, TIBC, IRON, RETICCTPCT in the last 72 hours. Sepsis Labs: No results for input(s): PROCALCITON, LATICACIDVEN in the last 168 hours.  Recent Results (from the past 240 hour(s))  Resp Panel by RT-PCR (Flu A&B, Covid) Nasopharyngeal Swab     Status: None   Collection Time: 04/17/21  1:00 PM    Specimen: Nasopharyngeal Swab; Nasopharyngeal(NP) swabs in vial transport medium  Result Value Ref Range Status   SARS Coronavirus 2 by RT PCR NEGATIVE NEGATIVE Final    Comment: (NOTE) SARS-CoV-2 target  nucleic acids are NOT DETECTED.  The SARS-CoV-2 RNA is generally detectable in upper respiratory specimens during the acute phase of infection. The lowest concentration of SARS-CoV-2 viral copies this assay can detect is 138 copies/mL. A negative result does not preclude SARS-Cov-2 infection and should not be used as the sole basis for treatment or other patient management decisions. A negative result may occur with  improper specimen collection/handling, submission of specimen other than nasopharyngeal swab, presence of viral mutation(s) within the areas targeted by this assay, and inadequate number of viral copies(<138 copies/mL). A negative result must be combined with clinical observations, patient history, and epidemiological information. The expected result is Negative.  Fact Sheet for Patients:  EntrepreneurPulse.com.au  Fact Sheet for Healthcare Providers:  IncredibleEmployment.be  This test is no t yet approved or cleared by the [place] FDA and  has been authorized for detection and/or diagnosis of SARS-CoV-2 by FDA under an Emergency Use Authorization (EUA). This EUA will remain  in effect (meaning this test can be used) for the duration of the COVID-19 declaration under Section 564(b)(1) of the Act, 21 U.S.C.section 360bbb-3(b)(1), unless the authorization is terminated  or revoked sooner.       Influenza A by PCR NEGATIVE NEGATIVE Final   Influenza B by PCR NEGATIVE NEGATIVE Final    Comment: (NOTE) The Xpert Xpress SARS-CoV-2/FLU/RSV plus assay is intended as an aid in the diagnosis of influenza from Nasopharyngeal swab specimens and should not be used as a sole basis for treatment. Nasal washings and aspirates are  unacceptable for Xpert Xpress SARS-CoV-2/FLU/RSV testing.  Fact Sheet for Patients: EntrepreneurPulse.com.au  Fact Sheet for Healthcare Providers: IncredibleEmployment.be  This test is not yet approved or cleared by the [place] FDA and has been authorized for detection and/or diagnosis of SARS-CoV-2 by FDA under an Emergency Use Authorization (EUA). This EUA will remain in effect (meaning this test can be used) for the duration of the COVID-19 declaration under Section 564(b)(1) of the Act, 21 U.S.C. section 360bbb-3(b)(1), unless the authorization is terminated or revoked.  Performed at [facility], [address] [place], [place] [address]   MRSA Next Gen by PCR, Nasal     Status: None   Collection Time: 04/21/21  8:50 AM   Specimen: Nasal Mucosa; Nasal Swab  Result Value Ref Range Status   MRSA by PCR Next Gen NOT DETECTED NOT DETECTED Final    Comment: (NOTE) The GeneXpert MRSA Assay (FDA approved for NASAL specimens only), is one component of a comprehensive MRSA colonization surveillance program. It is not intended to diagnose MRSA infection nor to guide or monitor treatment for MRSA infections. Test performance is not FDA approved in patients less than 34 years old. Performed at [facility], [address] [place], [place] [address]       RN Pressure Injury Documentation:     Estimated body mass index is 25.33 kg/m as calculated from the following:   Height as of this encounter: 5\' 9"  (1.753 m).   Weight as of this encounter: 77.8 kg.  Malnutrition Type:   Malnutrition Characteristics:   Nutrition Interventions:    Radiology Studies: DG Chest Port 1 View  Result Date: 04/21/2021 CLINICAL DATA:  Shortness of breath EXAM: PORTABLE CHEST 1 VIEW COMPARISON:  Previous studies including the examination of 04/18/2021 FINDINGS: Tip of right IJ dialysis catheter is seen at the junction of superior  vena cava and right atrium. Bilateral pleural effusions are seen, more so on the left  side. There is improvement in aeration of right lower lung fields. Air-fluid levels seen in the left lung are less evident, possibly due to semi upright position of the patient in the current study. IMPRESSION: There is interval improvement in aeration of right lower lung fields suggesting decrease in atelectasis/pneumonia. Bilateral pleural effusions, more so on the left side. There are patchy densities in the left mid and left lower lung fields suggesting atelectasis/pneumonia. Electronically Signed   By: Elmer Picker M.D.   On: 04/21/2021 17:31    Scheduled Meds:  amLODipine  10 mg Oral Daily   aspirin  325 mg Oral Daily   calcium acetate  1,334 mg Oral TID WC   carvedilol  25 mg Oral BID WC   Chlorhexidine Gluconate Cloth  6 each Topical Q0600   epoetin (EPOGEN/PROCRIT) injection  10,000 Units Intravenous Q M,W,F-HD   heparin  5,000 Units Subcutaneous Q8H   hydrALAZINE  100 mg Oral Q8H   minoxidil  5 mg Oral Daily   pantoprazole  40 mg Oral Daily   sodium chloride flush  3 mL Intravenous Q12H   Continuous Infusions:  sodium chloride     sodium chloride     sodium chloride      LOS: 4 days   [place], DO [facility] PAGER is on AMION  If 7PM-7AM, please contact night-coverage [email or web address]

## 2021-04-22 NOTE — Progress Notes (Signed)
Pt uncooperative with said staff in obtaining Vital Signs; pt verbally refused 0600 scheduled medications too; pt asked me to get out of his room verbalizing "you know what you done"; previously during the shift I went into the pt's room while the pt was talking/facetiming on his personal cell phone and asked him to lower his voice as the loudness was disturbing the patients who were sleeping near his room; he became angry and asked me to repeat what he said and I advised him that he could talk on his cell phone but needed to lower the volume in order not to disturb others; the pt's TV volume was loud too, however, I did not ask him to lower the volume on the TV; the pt told me to get out; this nurse left the room as requested by the pt

## 2021-04-22 NOTE — Progress Notes (Signed)
Pt removed his IV dressing, requesting new dressing, educated again and replaced IV dressing

## 2021-04-22 NOTE — Progress Notes (Addendum)
SATURATION QUALIFICATIONS: (This note is used to comply with regulatory documentation for home oxygen)  Patient Saturations on Room Air at Rest = 94%  Patient Saturations on Room Air while Ambulating = 91-92%, 86% walking up stairs  Patient Saturations on 3 Liters of oxygen while Ambulating = 90%-stairs  Please briefly explain why patient needs home oxygen:

## 2021-04-22 NOTE — Progress Notes (Signed)
[facility]  ROUNDING NOTE   Subjective:   Travis Long is a 36 year old African-American male with past medical concerns including hypertension and end-stage renal disease on dialysis.  Patient presents to the emergency department from his outpatient dialysis center with complaints of shortness of breath.  Patient will be admitted under observation for Acute pulmonary edema ([place]) [J81.0] Volume overload [E87.70]  Patient is known to our clinic and receives outpatient dialysis treatments at [facility], supervised by Dr. Holley Raring.  Patient receives dialysis on a MWF schedule.    Patient's main concern in today visit was how he was going through so much.  Patient went on to explain his lung and access issues I empathized with the patient Patient though did not complain of any change in his dyspnea. Patient is on 2 L nasal cannula.    Objective:  Vital signs in last 24 hours:  Temp:  [97.8 F (36.6 C)-99.3 F (37.4 C)] 98.2 F (36.8 C) (01/28 0740) Pulse Rate:  [91-103] 91 (01/28 0740) Resp:  [16-36] 19 (01/28 0740) BP: (100-156)/(47-88) 142/81 (01/28 0740) SpO2:  [95 %-100 %] 100 % (01/28 0740) Weight:  [77.8 kg-81.4 kg] 77.8 kg (01/27 1440)  Weight change:  Filed Weights   04/19/21 1345 04/21/21 0945 04/21/21 1440  Weight: 80.9 kg 81.4 kg 77.8 kg    Intake/Output: I/O last 3 completed shifts: In: 240 [P.O.:240] Out: 4501 [Emesis/NG output:500; Other:4001]   Intake/Output this shift:  No intake/output data recorded.  Physical Exam: General: NAD, resting in bed  Head: Normocephalic, atraumatic. Moist oral mucosal membranes  Eyes: Anicteric  Lungs:  Diminished on left, normal effort, 2 L [place]  Heart: Regular rate and rhythm  Abdomen:  Soft, nontender  Extremities:  No peripheral edema.  Neurologic: Nonfocal, moving all four extremities  Skin: No lesions  Access: Right IJ PermCath, Lt lower AVF    Basic Metabolic Panel: Recent Labs  Lab  04/18/21 0538 04/19/21 0925 04/20/21 0543 04/21/21 0617 04/22/21 0544  NA 134* 133* 132* 130* 134*  K 4.6 5.0 4.3 4.8 4.4  CL 96* 93* 93* 87* 90*  CO2 27 25 27 28 29   GLUCOSE 87 108* 84 115* 98  BUN 37* 56* 38* 51* 28*  CREATININE 9.53* 12.99* 9.22* 12.40* 8.07*  CALCIUM 8.7* 9.0 8.9 8.7* 9.3  MG  --  1.9 1.7 1.7 1.7  PHOS  --  7.5* 6.1* 6.7* 6.0*    Liver Function Tests: Recent Labs  Lab 04/19/21 0925 04/20/21 0543 04/21/21 0617 04/22/21 0544  AST 11* 10* 13* 11*  ALT 7 7 6 6   ALKPHOS 55 44 51 55  BILITOT 0.5 0.2* 0.4 0.4  PROT 8.0 7.9 8.0 8.3*  ALBUMIN 3.3* 3.2* 3.2* 3.4*   No results for input(s): LIPASE, AMYLASE in the last 168 hours. No results for input(s): AMMONIA in the last 168 hours.  CBC: Recent Labs  Lab 04/17/21 1026 04/19/21 0925 04/20/21 0543 04/21/21 0617 04/22/21 0544  WBC 7.2 7.9 7.9 10.7* 9.4  NEUTROABS  --  5.4 5.0 8.2* 6.5  HGB 8.3* 7.6* 7.4* 7.4* 8.0*  HCT 25.1* 22.9* 22.2* 22.7* 25.0*  MCV 94.4 94.6 94.1 95.8 94.0  PLT 256 247 265 273 306    Cardiac Enzymes: No results for input(s): CKTOTAL, CKMB, CKMBINDEX, TROPONINI in the last 168 hours.  BNP: Invalid input(s): POCBNP  CBG: No results for input(s): GLUCAP in the last 168 hours.  Microbiology: Results for orders placed or performed during the hospital encounter of  04/17/21  Resp Panel by RT-PCR (Flu A&B, Covid) Nasopharyngeal Swab     Status: None   Collection Time: 04/17/21  1:00 PM   Specimen: Nasopharyngeal Swab; Nasopharyngeal(NP) swabs in vial transport medium  Result Value Ref Range Status   SARS Coronavirus 2 by RT PCR NEGATIVE NEGATIVE Final    Comment: (NOTE) SARS-CoV-2 target nucleic acids are NOT DETECTED.  The SARS-CoV-2 RNA is generally detectable in upper respiratory specimens during the acute phase of infection. The lowest concentration of SARS-CoV-2 viral copies this assay can detect is 138 copies/mL. A negative result does not preclude  SARS-Cov-2 infection and should not be used as the sole basis for treatment or other patient management decisions. A negative result may occur with  improper specimen collection/handling, submission of specimen other than nasopharyngeal swab, presence of viral mutation(s) within the areas targeted by this assay, and inadequate number of viral copies(<138 copies/mL). A negative result must be combined with clinical observations, patient history, and epidemiological information. The expected result is Negative.  Fact Sheet for Patients:  EntrepreneurPulse.com.au  Fact Sheet for Healthcare Providers:  IncredibleEmployment.be  This test is no t yet approved or cleared by the [place] FDA and  has been authorized for detection and/or diagnosis of SARS-CoV-2 by FDA under an Emergency Use Authorization (EUA). This EUA will remain  in effect (meaning this test can be used) for the duration of the COVID-19 declaration under Section 564(b)(1) of the Act, 21 U.S.C.section 360bbb-3(b)(1), unless the authorization is terminated  or revoked sooner.       Influenza A by PCR NEGATIVE NEGATIVE Final   Influenza B by PCR NEGATIVE NEGATIVE Final    Comment: (NOTE) The Xpert Xpress SARS-CoV-2/FLU/RSV plus assay is intended as an aid in the diagnosis of influenza from Nasopharyngeal swab specimens and should not be used as a sole basis for treatment. Nasal washings and aspirates are unacceptable for Xpert Xpress SARS-CoV-2/FLU/RSV testing.  Fact Sheet for Patients: EntrepreneurPulse.com.au  Fact Sheet for Healthcare Providers: IncredibleEmployment.be  This test is not yet approved or cleared by the [place] FDA and has been authorized for detection and/or diagnosis of SARS-CoV-2 by FDA under an Emergency Use Authorization (EUA). This EUA will remain in effect (meaning this test can be used) for the duration of  the COVID-19 declaration under Section 564(b)(1) of the Act, 21 U.S.C. section 360bbb-3(b)(1), unless the authorization is terminated or revoked.  Performed at [facility], [address] [place], [place] [address]   MRSA Next Gen by PCR, Nasal     Status: None   Collection Time: 04/21/21  8:50 AM   Specimen: Nasal Mucosa; Nasal Swab  Result Value Ref Range Status   MRSA by PCR Next Gen NOT DETECTED NOT DETECTED Final    Comment: (NOTE) The GeneXpert MRSA Assay (FDA approved for NASAL specimens only), is one component of a comprehensive MRSA colonization surveillance program. It is not intended to diagnose MRSA infection nor to guide or monitor treatment for MRSA infections. Test performance is not FDA approved in patients less than 26 years old. Performed at [facility], [address] [place], [place] [address]     Coagulation Studies: No results for input(s): LABPROT, INR in the last 72 hours.  Urinalysis: No results for input(s): COLORURINE, LABSPEC, PHURINE, GLUCOSEU, HGBUR, BILIRUBINUR, KETONESUR, PROTEINUR, UROBILINOGEN, NITRITE, LEUKOCYTESUR in the last 72 hours.  Invalid input(s): APPERANCEUR    Imaging: DG Chest Port 1 View  Result Date: 04/21/2021 CLINICAL DATA:  Shortness  of breath EXAM: PORTABLE CHEST 1 VIEW COMPARISON:  Previous studies including the examination of 04/18/2021 FINDINGS: Tip of right IJ dialysis catheter is seen at the junction of superior vena cava and right atrium. Bilateral pleural effusions are seen, more so on the left side. There is improvement in aeration of right lower lung fields. Air-fluid levels seen in the left lung are less evident, possibly due to semi upright position of the patient in the current study. IMPRESSION: There is interval improvement in aeration of right lower lung fields suggesting decrease in atelectasis/pneumonia. Bilateral pleural effusions, more so on the left side. There are patchy densities in  the left mid and left lower lung fields suggesting atelectasis/pneumonia. Electronically Signed   By: Elmer Picker M.D.   On: 04/21/2021 17:31     Medications:    sodium chloride     sodium chloride     sodium chloride      amLODipine  10 mg Oral Daily   aspirin  325 mg Oral Daily   calcium acetate  1,334 mg Oral TID WC   carvedilol  25 mg Oral BID WC   Chlorhexidine Gluconate Cloth  6 each Topical Q0600   epoetin (EPOGEN/PROCRIT) injection  10,000 Units Intravenous Q M,W,F-HD   heparin  5,000 Units Subcutaneous Q8H   hydrALAZINE  100 mg Oral Q8H   minoxidil  5 mg Oral Daily   pantoprazole  40 mg Oral Daily   sodium chloride flush  3 mL Intravenous Q12H     Assessment/ Plan:  Travis Long is a 36 y.o.  male past medical concerns including hypertension and end-stage renal disease on dialysis.  Patient presents to the emergency department from his outpatient dialysis center with complaints of shortness of breath.  Patient will be admitted under observation for Acute pulmonary edema (HCC) [J81.0] Volume overload [E87.70]  [facility]/MWF/right IJ PermCath  Acute respiratory distress with end-stage renal disease on dialysis  Appreciate vascular surgery exchange of right PermCath on 04/18/21.              Patient last received dialysis yesterday on April 21, 2021.              Patient has agreed to 4-hour treatment with UF goal of 4 L based on pulmonology recommendations to reduce pulmonary effusion.             We have discussed with outpatient clinic the need to challenge patient 0.5 to 1 L with treatment.              Regarding discharge planning patient has been cleared to discharge from renal stance.              If patient is still inpatient then next treatment scheduled for Monday.  2. Anemia of chronic kidney disease  Hemoglobin is not at goal Lab Results  Component Value Date   HGB 8.0 (L) 04/22/2021  Continue EPO with dialysis  3. Secondary  Hyperparathyroidism:   Lab Results  Component Value Date   CALCIUM 9.3 04/22/2021   PHOS 6.0 (H) 04/22/2021  We will continue to monitor bone minerals  Continue calcium acetate, 2 capsules with meals.  4.  Hypertension with chronic kidney disease.  Home regimen includes amlodipine, carvedilol, minoxidil, and hydralazine.  All currently prescribed at this time.  BP 122/66 during dialysis  5.  Bilateral pleural effusions with left lung entrapment Pulmonology following.  Recommend increase fluid removal with dialysis.   Plan No need  for renal placement therapy today We will dialyze patient Monday We will continue to challenge patient fluid removal    LOS: 4 Travis Long s [facility] 1/28/20238:15 AM

## 2021-04-23 ENCOUNTER — Inpatient Hospital Stay: Payer: Medicare Other

## 2021-04-23 LAB — CBC WITH DIFFERENTIAL/PLATELET
Abs Immature Granulocytes: 0.04 10*3/uL (ref 0.00–0.07)
Basophils Absolute: 0.1 10*3/uL (ref 0.0–0.1)
Basophils Relative: 0 %
Eosinophils Absolute: 0.7 10*3/uL — ABNORMAL HIGH (ref 0.0–0.5)
Eosinophils Relative: 7 %
HCT: 23.6 % — ABNORMAL LOW (ref 39.0–52.0)
Hemoglobin: 7.6 g/dL — ABNORMAL LOW (ref 13.0–17.0)
Immature Granulocytes: 0 %
Lymphocytes Relative: 6 %
Lymphs Abs: 0.7 10*3/uL (ref 0.7–4.0)
MCH: 30.9 pg (ref 26.0–34.0)
MCHC: 32.2 g/dL (ref 30.0–36.0)
MCV: 95.9 fL (ref 80.0–100.0)
Monocytes Absolute: 1.4 10*3/uL — ABNORMAL HIGH (ref 0.1–1.0)
Monocytes Relative: 12 %
Neutro Abs: 8.6 10*3/uL — ABNORMAL HIGH (ref 1.7–7.7)
Neutrophils Relative %: 75 %
Platelets: 285 10*3/uL (ref 150–400)
RBC: 2.46 MIL/uL — ABNORMAL LOW (ref 4.22–5.81)
RDW: 14.2 % (ref 11.5–15.5)
WBC: 11.4 10*3/uL — ABNORMAL HIGH (ref 4.0–10.5)
nRBC: 0 % (ref 0.0–0.2)

## 2021-04-23 LAB — COMPREHENSIVE METABOLIC PANEL
ALT: 7 U/L (ref 0–44)
AST: 13 U/L — ABNORMAL LOW (ref 15–41)
Albumin: 3.4 g/dL — ABNORMAL LOW (ref 3.5–5.0)
Alkaline Phosphatase: 53 U/L (ref 38–126)
Anion gap: 14 (ref 5–15)
BUN: 46 mg/dL — ABNORMAL HIGH (ref 6–20)
CO2: 28 mmol/L (ref 22–32)
Calcium: 8.9 mg/dL (ref 8.9–10.3)
Chloride: 89 mmol/L — ABNORMAL LOW (ref 98–111)
Creatinine, Ser: 11.64 mg/dL — ABNORMAL HIGH (ref 0.61–1.24)
GFR, Estimated: 5 mL/min — ABNORMAL LOW (ref >60)
Glucose, Bld: 117 mg/dL — ABNORMAL HIGH (ref 70–99)
Potassium: 4.6 mmol/L (ref 3.5–5.1)
Sodium: 131 mmol/L — ABNORMAL LOW (ref 135–145)
Total Bilirubin: 0.3 mg/dL (ref 0.3–1.2)
Total Protein: 8.2 g/dL — ABNORMAL HIGH (ref 6.5–8.1)

## 2021-04-23 LAB — PHOSPHORUS: Phosphorus: 7 mg/dL — ABNORMAL HIGH (ref 2.5–4.6)

## 2021-04-23 LAB — MAGNESIUM: Magnesium: 1.8 mg/dL (ref 1.7–2.4)

## 2021-04-23 MED ORDER — IPRATROPIUM BROMIDE 0.02 % IN SOLN
0.5000 mg | Freq: Two times a day (BID) | RESPIRATORY_TRACT | Status: DC
  Administered 2021-04-23 – 2021-04-27 (×8): 0.5 mg via RESPIRATORY_TRACT
  Filled 2021-04-23 (×8): qty 2.5

## 2021-04-23 MED ORDER — LEVALBUTEROL HCL 0.63 MG/3ML IN NEBU
0.6300 mg | INHALATION_SOLUTION | Freq: Two times a day (BID) | RESPIRATORY_TRACT | Status: DC
  Administered 2021-04-23 – 2021-04-27 (×8): 0.63 mg via RESPIRATORY_TRACT
  Filled 2021-04-23 (×9): qty 3

## 2021-04-23 NOTE — Progress Notes (Signed)
SATURATION QUALIFICATIONS: (This note is used to comply with regulatory documentation for home oxygen)  Patient Saturations on Room Air at Rest = 94%  Patient Saturations on Room Air while Ambulating = 86%  Patient Saturations on 3 Liters of oxygen while Ambulating = 90%  Please briefly explain why patient needs home oxygen:

## 2021-04-23 NOTE — Progress Notes (Signed)
[facility]  ROUNDING NOTE   Subjective:   Travis Long is a 36 year old African-American male with past medical concerns including hypertension and end-stage renal disease on dialysis.  Patient presents to the emergency department from his outpatient dialysis center with complaints of shortness of breath.  Patient will be admitted under observation for Acute pulmonary edema ([place]) [J81.0] Volume overload [E87.70]  Patient is known to our clinic and receives outpatient dialysis treatments at [facility], supervised by Dr. Holley Raring.  Patient receives dialysis on a MWF schedule.    Patient offers no new specific physical complaints I then discussed with the patient that we will continue to challenge fluid removal.  Patient was understanding    Objective:  Vital signs in last 24 hours:  Temp:  [97.8 F (36.6 C)-98.7 F (37.1 C)] 98.7 F (37.1 C) (01/29 0603) Pulse Rate:  [87-95] 95 (01/29 0603) Resp:  [16-18] 16 (01/29 0603) BP: (125-151)/(64-82) 151/82 (01/29 0603) SpO2:  [94 %-97 %] 95 % (01/29 0749)  Weight change:  Filed Weights   04/19/21 1345 04/21/21 0945 04/21/21 1440  Weight: 80.9 kg 81.4 kg 77.8 kg    Intake/Output: I/O last 3 completed shifts: In: 720 [P.O.:720] Out: -    Intake/Output this shift:  No intake/output data recorded.  Physical Exam: General: NAD, resting in bed  Head: Normocephalic, atraumatic. Moist oral mucosal membranes  Eyes: Anicteric  Lungs:  Diminished on left, normal effort, 2 L [place]  Heart: Regular rate and rhythm  Abdomen:  Soft, nontender  Extremities:  No peripheral edema.  Neurologic: Nonfocal, moving all four extremities  Skin: No lesions  Access: Right IJ PermCath, Lt lower AVF    Basic Metabolic Panel: Recent Labs  Lab 04/19/21 0925 04/20/21 0543 04/21/21 0617 04/22/21 0544 04/23/21 0647  NA 133* 132* 130* 134* 131*  K 5.0 4.3 4.8 4.4 4.6  CL 93* 93* 87* 90* 89*  CO2 25 27 28 29 28   GLUCOSE 108* 84  115* 98 117*  BUN 56* 38* 51* 28* 46*  CREATININE 12.99* 9.22* 12.40* 8.07* 11.64*  CALCIUM 9.0 8.9 8.7* 9.3 8.9  MG 1.9 1.7 1.7 1.7 1.8  PHOS 7.5* 6.1* 6.7* 6.0* 7.0*    Liver Function Tests: Recent Labs  Lab 04/19/21 0925 04/20/21 0543 04/21/21 0617 04/22/21 0544 04/23/21 0647  AST 11* 10* 13* 11* 13*  ALT 7 7 6 6 7   ALKPHOS 55 44 51 55 53  BILITOT 0.5 0.2* 0.4 0.4 0.3  PROT 8.0 7.9 8.0 8.3* 8.2*  ALBUMIN 3.3* 3.2* 3.2* 3.4* 3.4*   No results for input(s): LIPASE, AMYLASE in the last 168 hours. No results for input(s): AMMONIA in the last 168 hours.  CBC: Recent Labs  Lab 04/19/21 0925 04/20/21 0543 04/21/21 0617 04/22/21 0544 04/23/21 0647  WBC 7.9 7.9 10.7* 9.4 11.4*  NEUTROABS 5.4 5.0 8.2* 6.5 8.6*  HGB 7.6* 7.4* 7.4* 8.0* 7.6*  HCT 22.9* 22.2* 22.7* 25.0* 23.6*  MCV 94.6 94.1 95.8 94.0 95.9  PLT 247 265 273 306 285    Cardiac Enzymes: No results for input(s): CKTOTAL, CKMB, CKMBINDEX, TROPONINI in the last 168 hours.  BNP: Invalid input(s): POCBNP  CBG: No results for input(s): GLUCAP in the last 168 hours.  Microbiology: Results for orders placed or performed during the hospital encounter of 04/17/21  Resp Panel by RT-PCR (Flu A&B, Covid) Nasopharyngeal Swab     Status: None   Collection Time: 04/17/21  1:00 PM   Specimen: Nasopharyngeal Swab; Nasopharyngeal(NP) swabs  in vial transport medium  Result Value Ref Range Status   SARS Coronavirus 2 by RT PCR NEGATIVE NEGATIVE Final    Comment: (NOTE) SARS-CoV-2 target nucleic acids are NOT DETECTED.  The SARS-CoV-2 RNA is generally detectable in upper respiratory specimens during the acute phase of infection. The lowest concentration of SARS-CoV-2 viral copies this assay can detect is 138 copies/mL. A negative result does not preclude SARS-Cov-2 infection and should not be used as the sole basis for treatment or other patient management decisions. A negative result may occur with  improper specimen  collection/handling, submission of specimen other than nasopharyngeal swab, presence of viral mutation(s) within the areas targeted by this assay, and inadequate number of viral copies(<138 copies/mL). A negative result must be combined with clinical observations, patient history, and epidemiological information. The expected result is Negative.  Fact Sheet for Patients:  EntrepreneurPulse.com.au  Fact Sheet for Healthcare Providers:  IncredibleEmployment.be  This test is no t yet approved or cleared by the [place] FDA and  has been authorized for detection and/or diagnosis of SARS-CoV-2 by FDA under an Emergency Use Authorization (EUA). This EUA will remain  in effect (meaning this test can be used) for the duration of the COVID-19 declaration under Section 564(b)(1) of the Act, 21 U.S.C.section 360bbb-3(b)(1), unless the authorization is terminated  or revoked sooner.       Influenza A by PCR NEGATIVE NEGATIVE Final   Influenza B by PCR NEGATIVE NEGATIVE Final    Comment: (NOTE) The Xpert Xpress SARS-CoV-2/FLU/RSV plus assay is intended as an aid in the diagnosis of influenza from Nasopharyngeal swab specimens and should not be used as a sole basis for treatment. Nasal washings and aspirates are unacceptable for Xpert Xpress SARS-CoV-2/FLU/RSV testing.  Fact Sheet for Patients: EntrepreneurPulse.com.au  Fact Sheet for Healthcare Providers: IncredibleEmployment.be  This test is not yet approved or cleared by the [place] FDA and has been authorized for detection and/or diagnosis of SARS-CoV-2 by FDA under an Emergency Use Authorization (EUA). This EUA will remain in effect (meaning this test can be used) for the duration of the COVID-19 declaration under Section 564(b)(1) of the Act, 21 U.S.C. section 360bbb-3(b)(1), unless the authorization is terminated or revoked.  Performed at [facility], [address] [place], [place] [address]   MRSA Next Gen by PCR, Nasal     Status: None   Collection Time: 04/21/21  8:50 AM   Specimen: Nasal Mucosa; Nasal Swab  Result Value Ref Range Status   MRSA by PCR Next Gen NOT DETECTED NOT DETECTED Final    Comment: (NOTE) The GeneXpert MRSA Assay (FDA approved for NASAL specimens only), is one component of a comprehensive MRSA colonization surveillance program. It is not intended to diagnose MRSA infection nor to guide or monitor treatment for MRSA infections. Test performance is not FDA approved in patients less than 29 years old. Performed at [facility], [address] [place], [place] [address]     Coagulation Studies: No results for input(s): LABPROT, INR in the last 72 hours.  Urinalysis: No results for input(s): COLORURINE, LABSPEC, PHURINE, GLUCOSEU, HGBUR, BILIRUBINUR, KETONESUR, PROTEINUR, UROBILINOGEN, NITRITE, LEUKOCYTESUR in the last 72 hours.  Invalid input(s): APPERANCEUR    Imaging: DG Chest Port 1 View  Result Date: 04/23/2021 CLINICAL DATA:  Shortness of breath. EXAM: PORTABLE CHEST 1 VIEW COMPARISON:  Portable chest 04/21/2021, chest CT 04/17/2021. FINDINGS: Right IJ dialysis catheter is stable, with tip at the superior cavoatrial junction. Complex and sizable  encompassing left hydropneumothorax appears similar to the prior studies. There is associated atelectasis or consolidation in the partially aerated left lung, more so in the lower zone. On the right, moderate layering pleural effusion persists unchanged. There is hazy atelectasis or pneumonia in the right base alongside the effusion, aside from which right lung is otherwise clear. Cardiac size is stable but not optimally seen. There is mild central vascular distension. Stable mediastinum. IMPRESSION: Stable overall aeration. There is perihilar vascular congestion without appreciable edema. Sizable encompassing and complex left  hydropneumothorax shows no obvious interval change as well as underlying opacities in the compressed left lung. Layering right pleural fluid appears stable as well as hazy overlying atelectasis or consolidation. Electronically Signed   By: Telford Nab M.D.   On: 04/23/2021 05:10   DG Chest Port 1 View  Result Date: 04/21/2021 CLINICAL DATA:  Shortness of breath EXAM: PORTABLE CHEST 1 VIEW COMPARISON:  Previous studies including the examination of 04/18/2021 FINDINGS: Tip of right IJ dialysis catheter is seen at the junction of superior vena cava and right atrium. Bilateral pleural effusions are seen, more so on the left side. There is improvement in aeration of right lower lung fields. Air-fluid levels seen in the left lung are less evident, possibly due to semi upright position of the patient in the current study. IMPRESSION: There is interval improvement in aeration of right lower lung fields suggesting decrease in atelectasis/pneumonia. Bilateral pleural effusions, more so on the left side. There are patchy densities in the left mid and left lower lung fields suggesting atelectasis/pneumonia. Electronically Signed   By: Elmer Picker M.D.   On: 04/21/2021 17:31     Medications:    sodium chloride     sodium chloride     sodium chloride      amLODipine  10 mg Oral Daily   aspirin  325 mg Oral Daily   calcium acetate  1,334 mg Oral TID WC   carvedilol  25 mg Oral BID WC   Chlorhexidine Gluconate Cloth  6 each Topical Q0600   epoetin (EPOGEN/PROCRIT) injection  10,000 Units Intravenous Q M,W,F-HD   guaiFENesin  1,200 mg Oral BID   heparin  5,000 Units Subcutaneous Q8H   hydrALAZINE  100 mg Oral Q8H   ipratropium  0.5 mg Nebulization Q6H   levalbuterol  0.63 mg Nebulization Q6H   minoxidil  5 mg Oral Daily   pantoprazole  40 mg Oral Daily   sodium chloride flush  3 mL Intravenous Q12H     Assessment/ Plan:  Travis Long is a 36 y.o.  male past medical concerns including  hypertension and end-stage renal disease on dialysis.  Patient presents to the emergency department from his outpatient dialysis center with complaints of shortness of breath.  Patient will be admitted under observation for Acute pulmonary edema (HCC) [J81.0] Volume overload [E87.70]  [facility]/MWF/right IJ PermCath  Acute respiratory distress with end-stage renal disease on dialysis  Appreciate vascular surgery exchange of right PermCath on 04/18/21.              Patient last received dialysis yesterday on April 21, 2021.              Patient has agreed to 4-hour treatment with UF goal of 4 L based on pulmonology recommendations to reduce pulmonary effusion.             We have discussed with outpatient clinic the need to challenge patient 0.5 to 1 L  with treatment.              Regarding discharge planning patient has been cleared to discharge from renal stance.              If patient is still inpatient then next treatment scheduled for Monday.  2. Anemia of chronic kidney disease  Hemoglobin is not at goal Lab Results  Component Value Date   HGB 7.6 (L) 04/23/2021  Continue EPO with dialysis  3. Secondary Hyperparathyroidism:   Lab Results  Component Value Date   CALCIUM 8.9 04/23/2021   PHOS 7.0 (H) 04/23/2021  We will continue to monitor bone minerals  Continue calcium acetate, 2 capsules with meals.  4.  Hypertension with chronic kidney disease.  Home regimen includes amlodipine, carvedilol, minoxidil, and hydralazine.  All currently prescribed at this time.  BP 122/66 during dialysis  5.  Bilateral pleural effusions with left lung entrapment Pulmonology following.  Recommend increase fluid removal with dialysis.   Plan  No need for renal placement therapy today We will dialyze patient Monday We will continue to challenge patient fluid removal    LOS: 5 Matsue Strom s [facility] 1/29/20238:03 AM

## 2021-04-23 NOTE — Plan of Care (Signed)

## 2021-04-23 NOTE — TOC Progression Note (Signed)
Transition of Care [facility]) - Progression Note    Patient Details  Name: Travis Long MRN: [id number] Date of Birth: December 16, 1985  Transition of Care [facility]) CM/SW [place], RN Phone Number: 04/23/2021, 10:36 AM  Clinical Narrative:   Patient will need home oxygen, he consents to home O2 through [facility].  Notified weekend team at [employer] of needs.  Will be delivered to patient room.  Potential Dc tomorrow after dialysis    Expected Discharge Plan: Home/Self Care Barriers to Discharge: Continued Medical Work up  Expected Discharge Plan and Services Expected Discharge Plan: Home/Self Care     Post Acute Care Choice: NA Living arrangements for the past 2 months: Single Family Home                                       Social Determinants of Health (SDOH) Interventions    Readmission Risk Interventions Readmission Risk Prevention Plan 04/20/2021  Transportation Screening Complete  PCP or Specialist Appt within 3-5 Days Complete  Social Work Consult for [place] Planning/Counseling Complete  Palliative Care Screening Not Applicable  Medication Review [employer] photographer) Complete

## 2021-04-23 NOTE — Progress Notes (Signed)
PULMONOLOGY         Date: 04/23/2021,   MRN# [id number] Travis Long [birth year]     AdmissionWeight: 79.4 kg                 CurrentWeight: 77.8 kg   Referring physician: Dr Laurell Josephs   CHIEF COMPLAINT:   Recurrent pleural effusion    HISTORY OF PRESENT ILLNESS   This is a 36 yo male with hx of recurrent pleural effusion with hx of ESRD on HD MWF s/p R IJ HD access placed. PCCM consulted to evaluate recurrent pleural left pleural effusion.   I reviewed CT chest with findings of bronchiectatic lungs and overlying moderate pleural effusions bilaterally with ex-vacuo physiology on right hemithorax due to rounded atelectasis and what appears to be lung entrapment.  He had thoracic surgery evaluation at [facility] and it was felt by surgery that it would be more harm to patient if he was to undergo decortication and invasive thoracoscopic management.He should be able to recruit more lung function via diuresis or more aggressive HD due to findings of bilateral effusions with compressive atelectasis.    04/20/21- patient is stable for dc home on room air. He is asked to follow up with [facility] or [facility] with thoracic surgery due to advanced complex loculated left pleural effusion with lung entrapment and ex-vacuo physiology. Patient is thankful and agreeable to plan.  04/23/21- Patient is stable, remains with anasarca and is actively being dialized. He is slowly improving but does have episodes of hypoxemia and this is related to compressive atelectasis on left and severe anemia.    I reviewed medical plan today with Attending physician Dr Alfredia Ferguson and recommendation for complex hydropneumothorax with atelectasis is to have thoracic surgery evaluation favorably at [facility] such as [facility].  Similar recommendations were placed by previous specialists. He does have plan to receive supplementary O2 upon dc and he is appreciative and agreeable to plan.   PAST MEDICAL HISTORY   Past  Medical History:  Diagnosis Date   ESRD on hemodialysis [facility])    Hypertension      SURGICAL HISTORY   Past Surgical History:  Procedure Laterality Date   A/V FISTULAGRAM Left 03/09/2021   Procedure: A/V Fistulagram;  Surgeon: Algernon Huxley, MD;  Location: [place] [facility];  Service: Cardiovascular;  Laterality: Left;   AV FISTULA PLACEMENT Left    DIALYSIS/PERMA CATHETER INSERTION N/A 04/18/2021   Procedure: DIALYSIS/PERMA CATHETER INSERTION;  Surgeon: Katha Cabal, MD;  Location: [facility];  Service: Cardiovascular;  Laterality: N/A;   IR CATHETER TUBE CHANGE  03/09/2021     FAMILY HISTORY   Family History  Problem Relation Age of Onset   Hypertension Mother    Diabetes Mellitus II Mother      SOCIAL HISTORY   Social History   Tobacco Use   Smoking status: Former    Types: Cigarettes   Smokeless tobacco: Never  Substance Use Topics   Alcohol use: Not Currently   Drug use: Never     MEDICATIONS    Home Medication:    Current Medication:  Current Facility-Administered Medications:    0.9 %  sodium chloride infusion, 100 mL, Intravenous, PRN, Schnier, [name], MD   0.9 %  sodium chloride infusion, 100 mL, Intravenous, PRN, Schnier, [name], MD   0.9 %  sodium chloride infusion, 250 mL, Intravenous, PRN, Schnier, [name], MD   alteplase (CATHFLO ACTIVASE) injection 2 mg,  2 mg, Intracatheter, Once PRN, Schnier, [name], MD   amLODipine (NORVASC) tablet 10 mg, 10 mg, Oral, Daily, Schnier, [name], MD, 10 mg at 04/23/21 4536   aspirin tablet 325 mg, 325 mg, Oral, Daily, Schnier, [name], MD, 325 mg at 04/21/21 1534   calcium acetate (PHOSLO) capsule 1,334 mg, 1,334 mg, Oral, TID WC, Schnier, [name], MD, 1,334 mg at 04/21/21 0739   carvedilol (COREG) tablet 25 mg, 25 mg, Oral, BID WC, Schnier, [name], MD, 25 mg at 04/23/21 0914   Chlorhexidine Gluconate Cloth 2 % PADS 6 each, 6 each, Topical, Q0600, Schnier, [name], MD, 6  each at 04/19/21 0730   epoetin alfa (EPOGEN) injection 10,000 Units, 10,000 Units, Intravenous, Q M,W,F-HD, Breeze, Shantelle, NP, 10,000 Units at 04/21/21 1325   guaiFENesin (MUCINEX) 12 hr tablet 1,200 mg, 1,200 mg, Oral, BID, Sheikh, Omair Latif, DO   heparin injection 1,000 Units, 1,000 Units, Dialysis, PRN, Delana Meyer, [name], MD, 10,000 Units at 04/19/21 1341   heparin injection 5,000 Units, 5,000 Units, Subcutaneous, Q8H, Schnier, [name], MD, 5,000 Units at 04/18/21 2308   hydrALAZINE (APRESOLINE) tablet 100 mg, 100 mg, Oral, Q8H, Schnier, [name], MD, 100 mg at 04/21/21 2122   HYDROcodone-acetaminophen (NORCO/VICODIN) 5-325 MG per tablet 1 tablet, 1 tablet, Oral, Q6H PRN, Max Sane, MD, 1 tablet at 04/22/21 1713   ipratropium (ATROVENT) nebulizer solution 0.5 mg, 0.5 mg, Nebulization, Q6H, Sheikh, Omair Latif, DO, 0.5 mg at 04/23/21 0749   levalbuterol (XOPENEX) nebulizer solution 0.63 mg, 0.63 mg, Nebulization, Q6H, Sheikh, Omair Latif, DO, 0.63 mg at 04/23/21 0749   lidocaine (PF) (XYLOCAINE) 1 % injection 5 mL, 5 mL, Intradermal, PRN, Schnier, [name], MD   lidocaine-prilocaine (EMLA) cream 1 application, 1 application, Topical, PRN, Schnier, [name], MD   minoxidil (LONITEN) tablet 5 mg, 5 mg, Oral, Daily, Schnier, [name], MD, 5 mg at 04/23/21 0914   pantoprazole (PROTONIX) EC tablet 40 mg, 40 mg, Oral, Daily, Schnier, [name], MD   pentafluoroprop-tetrafluoroeth (GEBAUERS) aerosol 1 application, 1 application, Topical, PRN, Schnier, [name], MD   sodium chloride flush (NS) 0.9 % injection 3 mL, 3 mL, Intravenous, Q12H, Schnier, [name], MD, 3 mL at 04/23/21 0915   sodium chloride flush (NS) 0.9 % injection 3 mL, 3 mL, Intravenous, PRN, Schnier, [name], MD    ALLERGIES   Patient has no known allergies.     REVIEW OF SYSTEMS    Review of Systems:  Gen:  Denies  fever, sweats, chills weigh loss  HEENT: Denies blurred vision, double vision, ear pain,  eye pain, hearing loss, nose bleeds, sore throat Cardiac:  No dizziness, chest pain or heaviness, chest tightness,edema Resp:   Denies cough or sputum porduction, shortness of breath,wheezing, hemoptysis,  Gi: Denies swallowing difficulty, stomach pain, nausea or vomiting, diarrhea, constipation, bowel incontinence Gu:  Denies bladder incontinence, burning urine Ext:   Denies Joint pain, stiffness or swelling Skin: Denies  skin rash, easy bruising or bleeding or hives Endoc:  Denies polyuria, polydipsia , polyphagia or weight change Psych:   Denies depression, insomnia or hallucinations   Other:  All other systems negative   VS: BP (!) 162/92 (BP Location: Right Arm)    Pulse 94    Temp 98 F (36.7 C) (Oral)    Resp 18    Ht 5\' 9"  (1.753 m)    Wt 77.8 kg    SpO2 94%    BMI 25.33 kg/m  PHYSICAL EXAM    GENERAL:NAD, no fevers, chills, no weakness no fatigue HEAD: Normocephalic, atraumatic.  EYES: Pupils equal, round, reactive to light. Extraocular muscles intact. No scleral icterus.  MOUTH: Moist mucosal membrane. Dentition intact. No abscess noted.  EAR, NOSE, THROAT: Clear without exudates. No external lesions.  NECK: Supple. No thyromegaly. No nodules. No JVD.  PULMONARY: decreased air entry bilaterally worse on left CARDIOVASCULAR: S1 and S2. Regular rate and rhythm. No murmurs, rubs, or gallops. No edema. Pedal pulses 2+ bilaterally.  GASTROINTESTINAL: Soft, nontender, nondistended. No masses. Positive bowel sounds. No hepatosplenomegaly.  MUSCULOSKELETAL: No swelling, clubbing, or edema. Range of motion full in all extremities.  NEUROLOGIC: Cranial nerves II through XII are intact. No gross focal neurological deficits. Sensation intact. Reflexes intact.  SKIN: No ulceration, lesions, rashes, or cyanosis. Skin warm and dry. Turgor intact.  PSYCHIATRIC: Mood, affect within normal limits. The patient is awake, alert and oriented x 3. Insight, judgment intact.       IMAGING     DG Chest 2 View  Result Date: 04/18/2021 CLINICAL DATA:  Shortness of breath.  End-stage renal disease. EXAM: CHEST - 2 VIEW COMPARISON:  04/17/2021 FINDINGS: No critical change since yesterday. Central line tip in the SVC above the right atrium. Bilateral pleural fluid collections, larger on the left than the right. Multiple air-fluid levels on the left appears similar. Associated pulmonary atelectasis/infiltrate appears the same. IMPRESSION: No change since yesterday. Bilateral effusions, larger on the left than the right, with multiple air-fluid levels on the left. Associated pulmonary volume loss/infiltrate. Electronically Signed   By: Nelson Chimes M.D.   On: 04/18/2021 10:21   DG Chest 2 View  Result Date: 04/17/2021 CLINICAL DATA:  Shortness of breath.  Dialysis. EXAM: CHEST - 2 VIEW COMPARISON:  March 15, 2021 FINDINGS: Dual lumen dialysis catheter with tip projecting over the superior cavoatrial junction. The heart size and mediastinal contours are obscured. Large right pleural effusion with atelectasis versus infiltrate involving the left lung. Two focal gas fluid levels in the left hemithorax 1 of the mid lung level the other at the apex may reflect focal areas of aerated lung but abscess not excluded consider further evaluation with chest CT. Small right pleural effusion. Bilateral pulmonary edema. The visualized skeletal structures are unchanged. IMPRESSION: Large left pleural effusion with a combination of edema and atelectasis versus infiltrate in the left lung, additionally there are 2 focal gas fluid-levels in the left hemithorax 1 of the mid lung level the other in the apex which may reflect focal areas of aerated lung but abscess not excluded, consider further evaluation with chest CT. Small right pleural effusion with right lung atelectasis and edema. Electronically Signed   By: Dahlia Bailiff M.D.   On: 04/17/2021 11:19   CT Chest Wo Contrast  Result Date: 04/17/2021 CLINICAL  DATA:  Pneumonia. Complication suspected. Abnormal chest radiograph. After 3 hours of dialysis treatment today common dialysis machine with stopping was told he needed dialysis access changed out. Hypertension. Patient has not felt well for 1 week. EXAM: CT CHEST WITHOUT CONTRAST TECHNIQUE: Multidetector CT imaging of the chest was performed following the standard protocol without IV contrast. RADIATION DOSE REDUCTION: This exam was performed according to the departmental dose-optimization program which includes automated exposure control, adjustment of the mA and/or kV according to patient size and/or use of iterative reconstruction technique. COMPARISON:  Chest radiographs 04/17/2021, AP chest 03/15/2021, 03/09/2021, 09/16/2020; CT chest 03/06/2021 FINDINGS: Right internal jugular dual-lumen central venous catheter with  tips within the central superior vena cava. Cardiovascular: Heart size is again mildly enlarged. Trace pericardial fluid is slightly increased from prior. No thoracic aortic aneurysm. Mediastinum/Nodes: No pathologically enlarged axillary lymph nodes by CT criteria. There are again multiple mildly enlarged mediastinal lymph nodes. The largest is again seen within the inferior right paratracheal region measuring up to 1.3 cm in short axis, not significantly changed from prior when measured in a similar manner. Again these are likely reactive. No definite hilar lymphadenopathy is seen, within the limitations of lack of IV contrast.The thyroid gland is grossly unremarkable. The esophagus follows a normal course of normal caliber. Lungs/Pleura: There is again opacification of the distal segmental left lower lobe airways. There is again high-grade consolidation within the posterior left lower lobe. Moderate bilateral pleural effusions are mildly increased from prior. Interval increase in air component of left-sided hydropneumothorax. This includes an air component measuring up to approximately 2.8 cm in  craniocaudal dimension on the current CT, increased from 2.1 cm on 03/06/2021 CT (as measured on sagittal series 6, image 122). There is also interval increase in air within the more inferior posterolateral hydropneumothorax component on this supine CT with air newly seen within the non dependent regions of the left mid and lower lung (axial series 3, images 84 through 99). Previously a small amount of air was seen within the posteromedial left lower lobe pleural space, and this is still mild but slightly increased from prior (axial series 3 images 63 through 84). Overall there is interval increase in fluid and air components of this left-sided hydropneumothorax. There again mild right peripheral paraseptal cystic emphysematous changes. Upper Abdomen: There is partial visualization of the kidneys which demonstrate moderate to high-grade atrophy consistent with known chronic kidney disease. Moderate to high-grade bilateral gynecomastia is again noted. Musculoskeletal: There is diffuse increased bone density again seen, consistent with chronic renal failure. IMPRESSION:: IMPRESSION: 1. Compared to 03/06/2021, there is interval increase in the fluid and air components of a moderate left hydropneumothorax. Interval mild increase in moderate bilateral pleural effusions. There again appear to be multiple loculations within the hydropneumothorax, also increased from prior. 2. There is again high-grade consolidation of the posterior left lower lobe either pneumonia versus round atelectasis. 3. Note is made of prior 03/09/2021 basilar pleural pigtail catheter placement in the region of this hydropneumothorax on prior 03/09/2021 radiographs. Following removal of this pigtail catheter, the extrapleural air and fluid appears to have increased over time. Electronically Signed   By: Yvonne Kendall M.D.   On: 04/17/2021 14:17   PERIPHERAL VASCULAR CATHETERIZATION  Result Date: 04/18/2021 See surgical note for result.  DG  Chest Port 1 View  Result Date: 04/23/2021 CLINICAL DATA:  Shortness of breath. EXAM: PORTABLE CHEST 1 VIEW COMPARISON:  Portable chest 04/21/2021, chest CT 04/17/2021. FINDINGS: Right IJ dialysis catheter is stable, with tip at the superior cavoatrial junction. Complex and sizable encompassing left hydropneumothorax appears similar to the prior studies. There is associated atelectasis or consolidation in the partially aerated left lung, more so in the lower zone. On the right, moderate layering pleural effusion persists unchanged. There is hazy atelectasis or pneumonia in the right base alongside the effusion, aside from which right lung is otherwise clear. Cardiac size is stable but not optimally seen. There is mild central vascular distension. Stable mediastinum. IMPRESSION: Stable overall aeration. There is perihilar vascular congestion without appreciable edema. Sizable encompassing and complex left hydropneumothorax shows no obvious interval change as well as underlying opacities in the  compressed left lung. Layering right pleural fluid appears stable as well as hazy overlying atelectasis or consolidation. Electronically Signed   By: Telford Nab M.D.   On: 04/23/2021 05:10   DG Chest Port 1 View  Result Date: 04/21/2021 CLINICAL DATA:  Shortness of breath EXAM: PORTABLE CHEST 1 VIEW COMPARISON:  Previous studies including the examination of 04/18/2021 FINDINGS: Tip of right IJ dialysis catheter is seen at the junction of superior vena cava and right atrium. Bilateral pleural effusions are seen, more so on the left side. There is improvement in aeration of right lower lung fields. Air-fluid levels seen in the left lung are less evident, possibly due to semi upright position of the patient in the current study. IMPRESSION: There is interval improvement in aeration of right lower lung fields suggesting decrease in atelectasis/pneumonia. Bilateral pleural effusions, more so on the left side. There are  patchy densities in the left mid and left lower lung fields suggesting atelectasis/pneumonia. Electronically Signed   By: Elmer Picker M.D.   On: 04/21/2021 17:31      ASSESSMENT/PLAN   Bilateral pleural effusions worse on left with lung entrapment         - patient is not on oxygen therapy          - he had thoracic surgery evaluation and is not candidate for surgery          -he improved with HD          -patient has been referred to [facility] for second opinion we discussed this at length and he agrees to follow up on outpatient.  He is stable and does have episodes of transient hypoxemia with exertion and will have O2 prescribed.     ESRD on HD  - patient does have fluid overload and would benefit from aggressive removal of fluid to help with pleural effusions   Major depression    - patient lost job and car and this is due to medical disease, recommend outpatient follow up with psychiatry       Thank you for allowing me to participate in the care of this patient.    Patient/Family are satisfied with care plan and all questions have been answered.  This document was prepared using Dragon voice recognition software and may include unintentional dictation errors.     Ottie Glazier, M.D.  Division of [place]

## 2021-04-23 NOTE — Progress Notes (Signed)
PROGRESS NOTE    Travis Long  FYB:[id number] DOB: 1985-06-17 DOA: 04/17/2021 PCP: Pcp, No  Brief Narrative:  Patient is a 36 year old African-American male with past medical history significant for but not limited to ESRD on hemodialysis Monday Wednesday Friday, hypertension as well as other comorbidities who presented with pulmonary edema and volume overload with HD catheter malfunction.  On 04/17/2020 EKG was performed and 04/18/2021 is right IJ tunneled catheter was exchanged by vascular.  After that he had some pulsatile bleeding but is now improved.  He is underwent hemodialysis on 123 and is undergoing hemodialysis again today.  His hypertensive urgency is improved and he does continue to have some volume overload and flash pulmonary edema.  He had a CT of the chest done which showed a high-grade consolidation in the left lower lobe either pneumonia versus atelectasis as well as a hydropneumothorax that appears increased from prior CT scan done in December 2022.  He is seen by [name], CT surgery last hospitalization and case was discussed with Dr. Cyndia Bent from this hospitalization and Dr. Cyndia Bent feels that he has had a complex multiloculated left pleural effusion of several months of duration that was treated with a thoracentesis and pigtail catheter insertion for tPA at last hospitalization and he feels that this patient will never be a surgery candidate so CT surgery feels there is nothing to intervene on.  They feel that basically his lung will never fully re-expand due to scar tissue from his effusions.  Pulmonary Dr. Mortimer Fries was consluted and he felt they could not offer anything at this time and so subsequently Dr. Lanney Gins was consulted for further evaluation and management and to try to help set up CT Surgery at [facility] for Second opinion.  After further discussion with Dr. Lanney Gins he recommended trying to get the patient as dry as possible and recommends aggressive removal of fluid with  dialysis to help with his pleural effusions and then reimage the patient.  Patient was on room air this morning but continues to complain of shortness of breath.  We will dialyze the patient tomorrow and do an ambulatory home O2 screen and if he is stable we will have the patient follow-up with [facility] due to his advanced complex loculated pleural effusion with lung entrapment and ex vacuo physiology.  Patient was dialyzed yesterday and they are going to try a 4-hour treatment today with ultrafiltration goal of 4 L based on pulmonary recommendations to reduce the pulmonary effusion.  They also discussed with the outpatient clinic to times the patient.  Patient continues to be short of breath and will do an amatory home O2 screen and repeat chest x-ray after his dialysis to evaluate his pleural effusion repeat chest x-ray done and showed that his right-sided improved however his left side still has a very large pleural effusion.  Patient continues to complain of dyspnea and was able to ambulate without desaturating however ambulating up the stairs he desaturated to 86% and needed at least 3 L to remain above 90.  We will add flutter valve and incentive spirometry as well as Xopenex and Atrovent  Assessment & Plan:   Principal Problem:   Volume overload Active Problems:   ESRD on hemodialysis (HCC)   Hypertension   Anemia in ESRD (end-stage renal disease) (HCC)   Pleural effusion   Hypertensive urgency  ESRD on Hemodialysis Monday Wednesday Friday Hyperphosphatemia -Was dialyzed on Monday and was dialyzed again yesterday.  His HD catheter was changed by vascular on 04/18/2021  given that was his malfunctioning -Patient's BUNs/creatinine went from 43/10.12 -> 37/9.53 -> 56/12.99 -> 38/9.22 -> 51/12.40 -> 28/8.07 -> 46/1.64 -We will continue monitor for bleeding and he has had some dried blood from his temporary dialysis catheter.  He was dialyzed yesterday and had increased ultrafiltration treatment of 4  L. -Patient's Phos Level was 7.5 and is improved to 6.0 yesterday but is now back up to 7.0 -Vascular surgery is following and the next step is to perform the left wrist fistulogram to ensure that the outflow from the proximal forearm centrally is widely patent and once this is verified they will move forward with revision of his fistula in the operating room; patient continues to complain of bleeding from his tunneled dialysis catheter site.  I asked Dr. Lorenso Courier to evaluate and he felt the patient would need a stitch but the patient was refused -C/w Calcium Acetate 1334 mg p.o. 3 times daily with meals -Further care per Nephro and the plan is to increase his ultrafiltration with HD with a goal of 1 L/h at 13 mL/kg/hr' the goal of ultrafiltration will be 4 L MI: Next scheduled dialysis session is on Monday  Anemia of Chronic Kidney Disease -Nephrology is following and his hemoglobin is below target and dropped from 8.3 to 7.6 yesterday and his hemoglobin/hematocrit is now 7.4/22.2 -> 7.4/22.7 -> 8.0/25.0 -> 7.6/23.6 -Currently going to get EPO with dialysis sessions -Continue to monitor for signs and symptoms of bleeding; currently no overt bleeding noted but he did have some when his [facility] was placed,: Patient continues to complain of his [facility] bleeding daily and so I asked Dr. Lorenso Courier to evaluate and Dr. Ulis Rias with replacement of the stitch but patient has refused -Patient has had no bleeding in dialysis but has some dried blood around his [facility] and on his dressing again today -Patient continues to be concerned about the bleeding and perseverating on this however since he is refuses nothing else that we can do Dr. Alphonzo Grieve has ordered a surgical dressing for the patient.  Hypertensive Urgency -Was present on admission likely in the setting of volume overload/pulmonary edema -This is resolved now -Blood pressure remains elevated -C/w Home Amlodipine 10 mg po Daily, Carvedilol 25 mg po BID, Hydralazine 100 mg  po q8h, and Minoxidil 5 mg po Daily  -Last blood pressure reading was 125/70  Left lower lobe pneumonia with high-grade consolidation versus atelectasis and hydropneumothorax Bilateral Pleural Effusions  -Appears to be increased from CT scan on 03/06/2021 -He seen by CT surgery Dr. Cyndia Bent and Dr. Cyndia Bent felt hey feel that basically his lung will never fully re-expand due to scar tissue from his effusions and felt that CT surgery had nothing else to offer at this time and felt that his pleural biopsy of decortication would be high risk -Dr. Mortimer Fries  was consulted but he recommended getting a second opinion by CT surgery at [facility] or [facility] -Have consulted Dr. Lanney Gins for further evaluation and attempt to get him set up with [facility] CT surgery -Dr. Lanney Gins evaluated and the patient was not on oxygen therapy as he subjectively uses it when he feels short of breath.  He feels that the patient will need a second opinion referral but he recommends aggressively diuresing and removing fluid from the patient to help with his pleural effusions and then reimage to see how much lung entrapment the patient actually has if we can get him dry as possible -SpO2: 95 % O2 Flow Rate (L/min): 3 L/min;  patient continues to wear supplemental oxygen via nasal cannula and was back on 3 L today and needed for ambulation given that he desaturated to 86% -Chest x-ray from 04/18/2021 showed "No change since yesterday. Bilateral effusions, larger on the left than the right, with multiple air-fluid levels on the left. Associated pulmonary volume loss/infiltrate." -Repeat CXR this a.m. showed "Stable overall aeration. There is perihilar vascular congestion without appreciable edema. Sizable encompassing and complex left hydropneumothorax shows no obvious interval change as well as underlying opacities in the compressed left lung. Layering right pleural fluid appears stable as well as hazy overlying atelectasis or consolidation." -Ambulatory  home O2 screen done yesterday and he did desaturate going up the stairs and will require supplemental oxygen 3 L so we have written this order -We will place him on Xopenex and Atrovent and continued guaifenesin, and flutter valve, incentive spirometry -I spoke with Dr. Lanney Gins and Dr. Theador Hawthorne and the plan is for the patient to be dialyzed again tomorrow and then anticipate discharge after dialysis with outpatient thoracic surgery evaluation and second opinion follow-up  Hyponatremia -Sodium has gone from 134 -> 133 -> 132 -> 130 -> 134 -> 131 -Continue monitor and trend and to be addressed in dialysis -Repeat CMP in a.m.  Leukocytosis -Patient's WBC went from 7.9 -> 10.7 -> 9.4 and is now 11.4 -Likely this is reactive -Monitor and trend and repeat CBC in the a.m.  GERD/GI Prophylaxis -C/w Pantoprazole 40 mg   DVT prophylaxis: Heparin 5,000 units sq q8h Code Status: FULL CODE  Family Communication: No family present at bedside  Disposition Plan: Pending further clinical Improvement and clearance by specialists   Status is: Inpatient  Remains inpatient appropriate because: He needs further work-up and improvement in his respiratory status prior to safe discharge disposition and clearance by pulmonary as well as vascular surgery  Consultants:  PCCM/Pulmonary Dr. Lanney Gins Discussed the case with Dr. Cyndia Bent of Cardiothoracic Surgery Nephrology Vascular surgery  Procedures: HD  Duration of a tunneled dialysis catheter of the right IJ with the same venous approach by Dr. Delana Meyer on 04/18/21  Antimicrobials:  Anti-infectives (From admission, onward)    Start     Dose/Rate Route Frequency Ordered Stop   04/19/21 0600  ceFAZolin (ANCEF) IVPB 2g/100 mL premix        2 g 200 mL/hr over 30 Minutes Intravenous On call to O.R. 04/18/21 1203 04/20/21 0559   04/18/21 1441  ceFAZolin (ANCEF) 1-4 GM/50ML-% IVPB       Note to Pharmacy: Corlis Hove H: cabinet override      04/18/21  1441 04/19/21 0244        Subjective: Seen and examined at bedside and he still feels a little short of breath.  He is mostly concerned about his tunneled dialysis catheter and bleeding daily.  He is very concerned about this and continues to mess with the dressing.  He is worried that he would get an infection.  I spoke with vascular surgery who came by to evaluate the patient and offer the patient a stitch and he has refused.  Plan is to dialyze the patient and discharge home after dialysis with outpatient follow-up at tertiary care center for evaluation of his complex hydropneumothorax with atelectasis.  Objective: Vitals:   04/23/21 0603 04/23/21 0749 04/23/21 0911 04/23/21 1248  BP: (!) 151/82  (!) 162/92 125/72  Pulse: 95  94 93  Resp: 16  18 18   Temp: 98.7 F (37.1 C)  98 F (36.7 C)  98.6 F (37 C)  TempSrc: Oral  Oral Oral  SpO2: 97% 95% 94% 95%  Weight:      Height:        Intake/Output Summary (Last 24 hours) at 04/23/2021 1558 Last data filed at 04/23/2021 1300 Gross per 24 hour  Intake 720 ml  Output --  Net 720 ml    Filed Weights   04/19/21 1345 04/21/21 0945 04/21/21 1440  Weight: 80.9 kg 81.4 kg 77.8 kg   Examination: Physical Exam:  Constitutional: WN/WD overweight African-American male currently no acute distress but is very concerned about his tunneled dialysis catheter Eyes: Lids and conjunctivae normal, sclerae anicteric  ENMT: External Ears, Nose appear normal. Grossly normal hearing.  Neck: Appears normal, supple, no cervical masses, normal ROM, no appreciable thyromegaly; no appreciable JVD Respiratory: Diminished to auscultation bilaterally with coarse breath sounds and some crackles on the left compared to the right and some slight rhonchi.  Currently has normal respiratory effort and is not wearing any supplemental oxygen via nasal cannula Cardiovascular: RRR, no murmurs / rubs / gallops. S1 and S2 auscultated.  Mild 1+ lower extremity  edema Abdomen: Soft, non-tender, non-distended. Bowel sounds positive.  GU: Deferred. Musculoskeletal: No clubbing / cyanosis of digits/nails. No joint deformity upper and lower extremities. TDC in place with some dry blood on it.  Skin: No rashes, lesions, ulcers. No induration; Warm and dry.  Neurologic: CN 2-12 grossly intact with no focal deficits. Romberg sign and cerebellar reflexes not assessed.  Psychiatric: Normal judgment and insight. Alert and oriented x 3. Normal mood and appropriate affect.   Data Reviewed: I have personally reviewed following labs and imaging studies  CBC: Recent Labs  Lab 04/19/21 0925 04/20/21 0543 04/21/21 0617 04/22/21 0544 04/23/21 0647  WBC 7.9 7.9 10.7* 9.4 11.4*  NEUTROABS 5.4 5.0 8.2* 6.5 8.6*  HGB 7.6* 7.4* 7.4* 8.0* 7.6*  HCT 22.9* 22.2* 22.7* 25.0* 23.6*  MCV 94.6 94.1 95.8 94.0 95.9  PLT 247 265 273 306 967    Basic Metabolic Panel: Recent Labs  Lab 04/19/21 0925 04/20/21 0543 04/21/21 0617 04/22/21 0544 04/23/21 0647  NA 133* 132* 130* 134* 131*  K 5.0 4.3 4.8 4.4 4.6  CL 93* 93* 87* 90* 89*  CO2 25 27 28 29 28   GLUCOSE 108* 84 115* 98 117*  BUN 56* 38* 51* 28* 46*  CREATININE 12.99* 9.22* 12.40* 8.07* 11.64*  CALCIUM 9.0 8.9 8.7* 9.3 8.9  MG 1.9 1.7 1.7 1.7 1.8  PHOS 7.5* 6.1* 6.7* 6.0* 7.0*    GFR: Estimated Creatinine Clearance: 9 mL/min (A) (by C-G formula based on SCr of 11.64 mg/dL (H)). Liver Function Tests: Recent Labs  Lab 04/19/21 0925 04/20/21 0543 04/21/21 0617 04/22/21 0544 04/23/21 0647  AST 11* 10* 13* 11* 13*  ALT 7 7 6 6 7   ALKPHOS 55 44 51 55 53  BILITOT 0.5 0.2* 0.4 0.4 0.3  PROT 8.0 7.9 8.0 8.3* 8.2*  ALBUMIN 3.3* 3.2* 3.2* 3.4* 3.4*    No results for input(s): LIPASE, AMYLASE in the last 168 hours. No results for input(s): AMMONIA in the last 168 hours. Coagulation Profile: No results for input(s): INR, PROTIME in the last 168 hours. Cardiac Enzymes: No results for input(s): CKTOTAL,  CKMB, CKMBINDEX, TROPONINI in the last 168 hours. BNP (last 3 results) No results for input(s): PROBNP in the last 8760 hours. HbA1C: No results for input(s): HGBA1C in the last 72 hours. CBG: No results for input(s): GLUCAP in the last  168 hours. Lipid Profile: No results for input(s): CHOL, HDL, LDLCALC, TRIG, CHOLHDL, LDLDIRECT in the last 72 hours. Thyroid Function Tests: No results for input(s): TSH, T4TOTAL, FREET4, T3FREE, THYROIDAB in the last 72 hours. Anemia Panel: No results for input(s): VITAMINB12, FOLATE, FERRITIN, TIBC, IRON, RETICCTPCT in the last 72 hours. Sepsis Labs: No results for input(s): PROCALCITON, LATICACIDVEN in the last 168 hours.  Recent Results (from the past 240 hour(s))  Resp Panel by RT-PCR (Flu A&B, Covid) Nasopharyngeal Swab     Status: None   Collection Time: 04/17/21  1:00 PM   Specimen: Nasopharyngeal Swab; Nasopharyngeal(NP) swabs in vial transport medium  Result Value Ref Range Status   SARS Coronavirus 2 by RT PCR NEGATIVE NEGATIVE Final    Comment: (NOTE) SARS-CoV-2 target nucleic acids are NOT DETECTED.  The SARS-CoV-2 RNA is generally detectable in upper respiratory specimens during the acute phase of infection. The lowest concentration of SARS-CoV-2 viral copies this assay can detect is 138 copies/mL. A negative result does not preclude SARS-Cov-2 infection and should not be used as the sole basis for treatment or other patient management decisions. A negative result may occur with  improper specimen collection/handling, submission of specimen other than nasopharyngeal swab, presence of viral mutation(s) within the areas targeted by this assay, and inadequate number of viral copies(<138 copies/mL). A negative result must be combined with clinical observations, patient history, and epidemiological information. The expected result is Negative.  Fact Sheet for Patients:  EntrepreneurPulse.com.au  Fact Sheet for  Healthcare Providers:  IncredibleEmployment.be  This test is no t yet approved or cleared by the [place] FDA and  has been authorized for detection and/or diagnosis of SARS-CoV-2 by FDA under an Emergency Use Authorization (EUA). This EUA will remain  in effect (meaning this test can be used) for the duration of the COVID-19 declaration under Section 564(b)(1) of the Act, 21 U.S.C.section 360bbb-3(b)(1), unless the authorization is terminated  or revoked sooner.       Influenza A by PCR NEGATIVE NEGATIVE Final   Influenza B by PCR NEGATIVE NEGATIVE Final    Comment: (NOTE) The Xpert Xpress SARS-CoV-2/FLU/RSV plus assay is intended as an aid in the diagnosis of influenza from Nasopharyngeal swab specimens and should not be used as a sole basis for treatment. Nasal washings and aspirates are unacceptable for Xpert Xpress SARS-CoV-2/FLU/RSV testing.  Fact Sheet for Patients: EntrepreneurPulse.com.au  Fact Sheet for Healthcare Providers: IncredibleEmployment.be  This test is not yet approved or cleared by the [place] FDA and has been authorized for detection and/or diagnosis of SARS-CoV-2 by FDA under an Emergency Use Authorization (EUA). This EUA will remain in effect (meaning this test can be used) for the duration of the COVID-19 declaration under Section 564(b)(1) of the Act, 21 U.S.C. section 360bbb-3(b)(1), unless the authorization is terminated or revoked.  Performed at [facility], [address] [place], [place] [address]   MRSA Next Gen by PCR, Nasal     Status: None   Collection Time: 04/21/21  8:50 AM   Specimen: Nasal Mucosa; Nasal Swab  Result Value Ref Range Status   MRSA by PCR Next Gen NOT DETECTED NOT DETECTED Final    Comment: (NOTE) The GeneXpert MRSA Assay (FDA approved for NASAL specimens only), is one component of a comprehensive MRSA colonization surveillance program. It is  not intended to diagnose MRSA infection nor to guide or monitor treatment for MRSA infections. Test performance is not FDA approved in patients less than 2 years  old. Performed at [facility], [address] [place], [place] [address]     RN Pressure Injury Documentation:     Estimated body mass index is 25.33 kg/m as calculated from the following:   Height as of this encounter: 5\' 9"  (1.753 m).   Weight as of this encounter: 77.8 kg.  Malnutrition Type:   Malnutrition Characteristics:   Nutrition Interventions:   Radiology Studies: DG Chest Port 1 View  Result Date: 04/23/2021 CLINICAL DATA:  Shortness of breath. EXAM: PORTABLE CHEST 1 VIEW COMPARISON:  Portable chest 04/21/2021, chest CT 04/17/2021. FINDINGS: Right IJ dialysis catheter is stable, with tip at the superior cavoatrial junction. Complex and sizable encompassing left hydropneumothorax appears similar to the prior studies. There is associated atelectasis or consolidation in the partially aerated left lung, more so in the lower zone. On the right, moderate layering pleural effusion persists unchanged. There is hazy atelectasis or pneumonia in the right base alongside the effusion, aside from which right lung is otherwise clear. Cardiac size is stable but not optimally seen. There is mild central vascular distension. Stable mediastinum. IMPRESSION: Stable overall aeration. There is perihilar vascular congestion without appreciable edema. Sizable encompassing and complex left hydropneumothorax shows no obvious interval change as well as underlying opacities in the compressed left lung. Layering right pleural fluid appears stable as well as hazy overlying atelectasis or consolidation. Electronically Signed   By: Telford Nab M.D.   On: 04/23/2021 05:10   DG Chest Port 1 View  Result Date: 04/21/2021 CLINICAL DATA:  Shortness of breath EXAM: PORTABLE CHEST 1 VIEW COMPARISON:  Previous studies including the  examination of 04/18/2021 FINDINGS: Tip of right IJ dialysis catheter is seen at the junction of superior vena cava and right atrium. Bilateral pleural effusions are seen, more so on the left side. There is improvement in aeration of right lower lung fields. Air-fluid levels seen in the left lung are less evident, possibly due to semi upright position of the patient in the current study. IMPRESSION: There is interval improvement in aeration of right lower lung fields suggesting decrease in atelectasis/pneumonia. Bilateral pleural effusions, more so on the left side. There are patchy densities in the left mid and left lower lung fields suggesting atelectasis/pneumonia. Electronically Signed   By: Elmer Picker M.D.   On: 04/21/2021 17:31    Scheduled Meds:  amLODipine  10 mg Oral Daily   aspirin  325 mg Oral Daily   calcium acetate  1,334 mg Oral TID WC   carvedilol  25 mg Oral BID WC   Chlorhexidine Gluconate Cloth  6 each Topical Q0600   epoetin (EPOGEN/PROCRIT) injection  10,000 Units Intravenous Q M,W,F-HD   guaiFENesin  1,200 mg Oral BID   heparin  5,000 Units Subcutaneous Q8H   hydrALAZINE  100 mg Oral Q8H   ipratropium  0.5 mg Nebulization BID   levalbuterol  0.63 mg Nebulization BID   minoxidil  5 mg Oral Daily   pantoprazole  40 mg Oral Daily   sodium chloride flush  3 mL Intravenous Q12H   Continuous Infusions:  sodium chloride     sodium chloride     sodium chloride      LOS: 5 days   [place], DO [facility] PAGER is on AMION  If 7PM-7AM, please contact night-coverage [email or web address]

## 2021-04-24 MED ORDER — ONDANSETRON HCL 4 MG/2ML IJ SOLN
4.0000 mg | Freq: Once | INTRAMUSCULAR | Status: DC
  Filled 2021-04-24: qty 2

## 2021-04-24 MED ORDER — HEPARIN SODIUM (PORCINE) 1000 UNIT/ML IJ SOLN
INTRAMUSCULAR | Status: AC
  Administered 2021-04-24: 3300 [IU]
  Filled 2021-04-24: qty 10

## 2021-04-24 MED ORDER — EPOETIN ALFA 10000 UNIT/ML IJ SOLN
INTRAMUSCULAR | Status: AC
  Filled 2021-04-24: qty 1

## 2021-04-24 NOTE — TOC Progression Note (Signed)
Transition of Care [facility]) - Progression Note    Patient Details  Name: Travis Long MRN: [id number] Date of Birth: [birth year]  Transition of Care [facility]) CM/SW Contact  Eileen Stanford, LCSW Phone Number: 04/24/2021, 11:53 AM  Clinical Narrative: [employer] notified that pt needs 02. Will be delivered to bedside.      Expected Discharge Plan: Home/Self Care Barriers to Discharge: Continued Medical Work up  Expected Discharge Plan and Services Expected Discharge Plan: Home/Self Care     Post Acute Care Choice: NA Living arrangements for the past 2 months: Single Family Home                                       Social Determinants of Health (SDOH) Interventions    Readmission Risk Interventions Readmission Risk Prevention Plan 04/20/2021  Transportation Screening Complete  PCP or Specialist Appt within 3-5 Days Complete  Social Work Consult for [place] Planning/Counseling Complete  Palliative Care Screening Not Applicable  Medication Review [employer]) Complete

## 2021-04-24 NOTE — Progress Notes (Signed)
PROGRESS NOTE    Travis Long  QPY:[id number] DOB: [birth year] DOA: 04/17/2021 PCP: Pcp, No  Brief Narrative:  Patient is a 36 year old African-American male with past medical history significant for but not limited to ESRD on hemodialysis Monday Wednesday Friday, hypertension as well as other comorbidities who presented with pulmonary edema and volume overload with HD catheter malfunction.  On 04/17/2020 EKG was performed and 04/18/2021 is right IJ tunneled catheter was exchanged by vascular.  After that he had some pulsatile bleeding but is now improved.  He is underwent hemodialysis on 123 and is undergoing hemodialysis again today.  His hypertensive urgency is improved and he does continue to have some volume overload and flash pulmonary edema.  He had a CT of the chest done which showed a high-grade consolidation in the left lower lobe either pneumonia versus atelectasis as well as a hydropneumothorax that appears increased from prior CT scan done in December 2022.  He is seen by [name], CT surgery last hospitalization and case was discussed with Dr. Cyndia Bent from this hospitalization and Dr. Cyndia Bent feels that he has had a complex multiloculated left pleural effusion of several months of duration that was treated with a thoracentesis and pigtail catheter insertion for tPA at last hospitalization and he feels that this patient will never be a surgery candidate so CT surgery feels there is nothing to intervene on.  They feel that basically his lung will never fully re-expand due to scar tissue from his effusions.  Pulmonary Dr. Mortimer Fries was consluted and he felt they could not offer anything at this time and so subsequently Dr. Lanney Gins was consulted for further evaluation and management and to try to help set up CT Surgery at [facility] for Second opinion.  After further discussion with Dr. Lanney Gins he recommended trying to get the patient as dry as possible and recommends aggressive removal of fluid with  dialysis to help with his pleural effusions and then reimage the patient.  Patient was on room air this morning but continues to complain of shortness of breath.  We will dialyze the patient tomorrow and do an ambulatory home O2 screen and if he is stable we will have the patient follow-up with [facility] due to his advanced complex loculated pleural effusion with lung entrapment and ex vacuo physiology.  Patient was dialyzed Friday and tried a 4-hour treatment today with ultrafiltration goal of 4 L based on pulmonary recommendations to reduce the pulmonary effusion.  They also discussed with the outpatient clinic to times the patient.  Patient continues to be short of breath and will do an amatory home O2 screen and repeat chest x-ray after his dialysis to evaluate his pleural effusion repeat chest x-ray done and showed that his right-sided improved however his left side still has a very large pleural effusion.  Patient continues to complain of dyspnea and was able to ambulate without desaturating however ambulating up the stairs he desaturated to 86% and needed at least 3 L to remain above 90.  We will add flutter valve and incentive spirometry as well as Xopenex and Atrovent  Patient is again seen in dialysis today and he was not feeling good at all and had several episodes of hypotension and nausea vomiting.  He was hypotensive intermittently and the goal was to try remove 4 L of ultrafiltration but patient was informed that they could not do it given his drop in blood pressure.  The patient's blood pressure continued to drop and only 1082 mL were able  to be removed.  Patient continued to not feel well with dialysis today and had to be given two 200 NS boluses during dialysis.  Home oxygen was set up for the patient today.  He continues to complain about his right [facility] causing some issues with his swallowing  Assessment & Plan:   Principal Problem:   Volume overload Active Problems:   ESRD on hemodialysis  (HCC)   Hypertension   Anemia in ESRD (end-stage renal disease) (HCC)   Pleural effusion   Hypertensive urgency  ESRD on Hemodialysis Monday Wednesday Friday Hyperphosphatemia -Was dialyzed on Monday and was dialyzed again yesterday.  His HD catheter was changed by vascular on 04/18/2021 given that was his malfunctioning -Patient's BUNs/creatinine went from 43/10.12 -> 37/9.53 -> 56/12.99 -> 38/9.22 -> 51/12.40 -> 28/8.07 -> 46/11.64 and not repeated today despite being ordered -We will continue monitor for bleeding and he has had some dried blood from his temporary dialysis catheter.  He was dialyzed yesterday and had increased ultrafiltration treatment of 4 L. -Patient's Phos Level was 7.5 and is improved to 6.0 yesterday but is now back up to 7.0 -Vascular surgery is following and the next step is to perform the left wrist fistulogram to ensure that the outflow from the proximal forearm centrally is widely patent and once this is verified they will move forward with revision of his fistula in the operating room; patient continues to complain of bleeding from his tunneled dialysis catheter site.  I asked Dr. Lorenso Courier to evaluate and he felt the patient would need a stitch but the patient was refused -C/w Calcium Acetate 1334 mg p.o. 3 times daily with meals -Further care per Nephro and the plan was to increase his ultrafiltration with HD with a goal of 1 L/h at 13 mL/kg/hr' the goal of ultrafiltration will be 4 L  -He has scheduled dialysis session today and unfortunately he was not able to get 4 L of ultrafiltration done given he continue to be intermittently nauseous and hypotensive.  His blood pressures medications have been held for now given that he dropped down to 69/34  Anemia of Chronic Kidney Disease -Nephrology is following and his hemoglobin is below target and dropped from 8.3 to 7.6 yesterday and his hemoglobin/hematocrit is now 7.4/22.2 -> 7.4/22.7 -> 8.0/25.0 -> 7.6/23.6 yesterday and  not repeated today despite being ordered -Currently going to get EPO with dialysis sessions -Continue to monitor for signs and symptoms of bleeding; currently no overt bleeding noted but he did have some when his [facility] was placed,: Patient continues to complain of his [facility] bleeding daily and so I asked Dr. Lorenso Courier to evaluate and Dr. Ulis Rias with replacement of the stitch but patient has refused -Patient has had no bleeding in dialysis but has some dried blood around his [facility] and on his dressing again today -Patient continues to be concerned about the bleeding and perseverating on this however since he is refuses nothing else that we can do Dr. Lorenso Courier has ordered a surgical dressing for the patient.  Hypertensive Urgency, now with blood pressures on the softer side during dialysis -Was present on admission likely in the setting of volume overload/pulmonary edema -This is resolved now -Blood pressure is now on the softer side and he appeared ill appearing -C/w Home Amlodipine 10 mg po Daily, Carvedilol 25 mg po BID, Hydralazine 100 mg po q8h, and Minoxidil 5 mg po Daily but have held these now given that he continued to have significant hypotension during his  dialysis session today -Last blood pressure reading was improved and back to 144/73  Left lower lobe pneumonia with high-grade consolidation versus atelectasis and hydropneumothorax Bilateral Pleural Effusions  -Appears to be increased from CT scan on 03/06/2021 -He seen by CT surgery Dr. Cyndia Bent and Dr. Cyndia Bent felt hey feel that basically his lung will never fully re-expand due to scar tissue from his effusions and felt that CT surgery had nothing else to offer at this time and felt that his pleural biopsy of decortication would be high risk -Dr. Mortimer Fries  was consulted but he recommended getting a second opinion by CT surgery at [facility] or [facility] -Have consulted Dr. Lanney Gins for further evaluation and attempt to get him set up with [facility] CT surgery -Dr.  Lanney Gins evaluated and the patient was not on oxygen therapy as he subjectively uses it when he feels short of breath.  He feels that the patient will need a second opinion referral but he recommends aggressively diuresing and removing fluid from the patient to help with his pleural effusions and then reimage to see how much lung entrapment the patient actually has if we can get him dry as possible -SpO2: 96 % O2 Flow Rate (L/min): 2 L/min; patient continues to wear supplemental oxygen via nasal cannula and was back on 3 L today and needed for ambulation given that he desaturated to 86% -Chest x-ray from 04/18/2021 showed "No change since yesterday. Bilateral effusions, larger on the left than the right, with multiple air-fluid levels on the left. Associated pulmonary volume loss/infiltrate." -Repeat CXR this a.m. showed "Stable overall aeration. There is perihilar vascular congestion without appreciable edema. Sizable encompassing and complex left hydropneumothorax shows no obvious interval change as well as underlying opacities in the compressed left lung. Layering right pleural fluid appears stable as well as hazy overlying atelectasis or consolidation." -Ambulatory home O2 screen done yesterday and he did desaturate going up the stairs and will require supplemental oxygen 3 L so we have written this order -We will place him on Xopenex and Atrovent and continued guaifenesin, and flutter valve, incentive spirometry -I spoke with Dr. Lanney Gins and Dr. Theador Hawthorne and the plan was for the patient be dialyzed today and then anticipate discharge after dialysis with outpatient thoracic surgery evaluation but given his events in dialysis and hypotension he was unable to get much fluid taken off.  We will defer further fluid removal to nephrology but patient did not feel well today and looked ill-appearing with nausea and vomiting  Hyponatremia -Sodium has gone from 134 -> 133 -> 132 -> 130 -> 134 -> 131 on last  check and was not repeated today despite being ordered -Continue monitor and trend and to be addressed in dialysis -Repeat CMP in a.m.  Leukocytosis -Patient's WBC went from 7.9 -> 10.7 -> 9.4 and is now 11.4 on last check and was ordered today but still not drawn and apparently not repeated yet -Likely this is reactive -Monitor and trend and repeat CBC in the a.m.  GERD/GI Prophylaxis -C/w Pantoprazole 40 mg   DVT prophylaxis: Heparin 5,000 units sq q8h Code Status: FULL CODE  Family Communication: No family present at bedside  Disposition Plan: Pending further clinical Improvement and clearance by specialists   Status is: Inpatient  Remains inpatient appropriate because: He needs further work-up and improvement in his respiratory status prior to safe discharge disposition and clearance by pulmonary as well as vascular surgery  Consultants:  PCCM/Pulmonary Dr. Lanney Gins Discussed the case with Dr. Cyndia Bent  of Cardiothoracic Surgery Nephrology Vascular surgery  Procedures: HD  Duration of a tunneled dialysis catheter of the right IJ with the same venous approach by Dr. Delana Meyer on 04/18/21  Antimicrobials:  Anti-infectives (From admission, onward)    Start     Dose/Rate Route Frequency Ordered Stop   04/19/21 0600  ceFAZolin (ANCEF) IVPB 2g/100 mL premix        2 g 200 mL/hr over 30 Minutes Intravenous On call to O.R. 04/18/21 1203 04/20/21 0559   04/18/21 1441  ceFAZolin (ANCEF) 1-4 GM/50ML-% IVPB       Note to Pharmacy: Corlis Hove H: cabinet override      04/18/21 1441 04/19/21 0244        Subjective: Seen and examined at bedside in the dialysis unit and he appeared ill-appearing and fatigued.  Was very nauseous and has been throwing up.  States that his throat was hurting and secondary to his [facility] catheter.  Had some lightheadedness earlier and blood pressure kept dropping.  It was not able to complete 4 L of ultrafiltration today and only had 1 L of ultrafiltration  given his hypotension and symptomatic nausea vomiting.  He is resting otherwise and had no further concerns or complaints.  Objective: Vitals:   04/24/21 1402 04/24/21 1415 04/24/21 1430 04/24/21 1740  BP: (!) 104/47 110/65 126/62 (!) 144/73  Pulse: 95 98 99 (!) 102  Resp: 19 17 (!) 25 17  Temp:  99.6 F (37.6 C)  99.6 F (37.6 C)  TempSrc:  Oral    SpO2: 95% 94% 92% 96%  Weight:  80.2 kg    Height:        Intake/Output Summary (Last 24 hours) at 04/24/2021 1814 Last data filed at 04/24/2021 1357 Gross per 24 hour  Intake --  Output 1082 ml  Net -1082 ml    Filed Weights   04/21/21 1440 04/24/21 1000 04/24/21 1415  Weight: 77.8 kg 83.2 kg 80.2 kg   Examination: Physical Exam:  Constitutional: WN/WD overweight African-American male currently in some distress and appears ill-appearing and nauseous and is wanting to rest Eyes: Lids and conjunctivae normal, sclerae anicteric  ENMT: External Ears, Nose appear normal. Grossly normal hearing.   Neck: Appears normal, supple, no cervical masses, normal ROM, no appreciable thyromegaly; no appreciable JVD Respiratory: Diminished to auscultation bilaterally with coarse breath sounds and some crackles on the left as well as some rhonchi worse on the left compared to right. Normal respiratory effort and patient is not tachypenic. No accessory muscle use.  Wearing supplemental oxygen via nasal cannula Cardiovascular: Slightly tachycardic rate, no murmurs / rubs / gallops. S1 and S2 auscultated.  Mild 1+ lower extremity edema Abdomen: Soft, non-tender, non-distended. Bowel sounds positive.  GU: Deferred. Musculoskeletal: No clubbing / cyanosis of digits/nails. No joint deformity upper and lower extremities.  Right TDC in place and is currently getting dialysis from it but he is complaining of having some neck pain and swallowing issues from Skin: No rashes, lesions, ulcers on limited skin evaluation. No induration; Warm and dry.  Neurologic:  CN 2-12 grossly intact with no focal deficits. Romberg sign and cerebellar reflexes not assessed.  Psychiatric: Normal judgment and insight.  Fatigued appearing and he is a little somnolent.  Data Reviewed: I have personally reviewed following labs and imaging studies  CBC: Recent Labs  Lab 04/19/21 0925 04/20/21 0543 04/21/21 0617 04/22/21 0544 04/23/21 0647  WBC 7.9 7.9 10.7* 9.4 11.4*  NEUTROABS 5.4 5.0 8.2* 6.5 8.6*  HGB 7.6* 7.4* 7.4* 8.0* 7.6*  HCT 22.9* 22.2* 22.7* 25.0* 23.6*  MCV 94.6 94.1 95.8 94.0 95.9  PLT 247 265 273 306 710    Basic Metabolic Panel: Recent Labs  Lab 04/19/21 0925 04/20/21 0543 04/21/21 0617 04/22/21 0544 04/23/21 0647  NA 133* 132* 130* 134* 131*  K 5.0 4.3 4.8 4.4 4.6  CL 93* 93* 87* 90* 89*  CO2 25 27 28 29 28   GLUCOSE 108* 84 115* 98 117*  BUN 56* 38* 51* 28* 46*  CREATININE 12.99* 9.22* 12.40* 8.07* 11.64*  CALCIUM 9.0 8.9 8.7* 9.3 8.9  MG 1.9 1.7 1.7 1.7 1.8  PHOS 7.5* 6.1* 6.7* 6.0* 7.0*    GFR: Estimated Creatinine Clearance: 9 mL/min (A) (by C-G formula based on SCr of 11.64 mg/dL (H)). Liver Function Tests: Recent Labs  Lab 04/19/21 0925 04/20/21 0543 04/21/21 0617 04/22/21 0544 04/23/21 0647  AST 11* 10* 13* 11* 13*  ALT 7 7 6 6 7   ALKPHOS 55 44 51 55 53  BILITOT 0.5 0.2* 0.4 0.4 0.3  PROT 8.0 7.9 8.0 8.3* 8.2*  ALBUMIN 3.3* 3.2* 3.2* 3.4* 3.4*    No results for input(s): LIPASE, AMYLASE in the last 168 hours. No results for input(s): AMMONIA in the last 168 hours. Coagulation Profile: No results for input(s): INR, PROTIME in the last 168 hours. Cardiac Enzymes: No results for input(s): CKTOTAL, CKMB, CKMBINDEX, TROPONINI in the last 168 hours. BNP (last 3 results) No results for input(s): PROBNP in the last 8760 hours. HbA1C: No results for input(s): HGBA1C in the last 72 hours. CBG: No results for input(s): GLUCAP in the last 168 hours. Lipid Profile: No results for input(s): CHOL, HDL, LDLCALC, TRIG,  CHOLHDL, LDLDIRECT in the last 72 hours. Thyroid Function Tests: No results for input(s): TSH, T4TOTAL, FREET4, T3FREE, THYROIDAB in the last 72 hours. Anemia Panel: No results for input(s): VITAMINB12, FOLATE, FERRITIN, TIBC, IRON, RETICCTPCT in the last 72 hours. Sepsis Labs: No results for input(s): PROCALCITON, LATICACIDVEN in the last 168 hours.  Recent Results (from the past 240 hour(s))  Resp Panel by RT-PCR (Flu A&B, Covid) Nasopharyngeal Swab     Status: None   Collection Time: 04/17/21  1:00 PM   Specimen: Nasopharyngeal Swab; Nasopharyngeal(NP) swabs in vial transport medium  Result Value Ref Range Status   SARS Coronavirus 2 by RT PCR NEGATIVE NEGATIVE Final    Comment: (NOTE) SARS-CoV-2 target nucleic acids are NOT DETECTED.  The SARS-CoV-2 RNA is generally detectable in upper respiratory specimens during the acute phase of infection. The lowest concentration of SARS-CoV-2 viral copies this assay can detect is 138 copies/mL. A negative result does not preclude SARS-Cov-2 infection and should not be used as the sole basis for treatment or other patient management decisions. A negative result may occur with  improper specimen collection/handling, submission of specimen other than nasopharyngeal swab, presence of viral mutation(s) within the areas targeted by this assay, and inadequate number of viral copies(<138 copies/mL). A negative result must be combined with clinical observations, patient history, and epidemiological information. The expected result is Negative.  Fact Sheet for Patients:  EntrepreneurPulse.com.au  Fact Sheet for Healthcare Providers:  IncredibleEmployment.be  This test is no t yet approved or cleared by the [place] FDA and  has been authorized for detection and/or diagnosis of SARS-CoV-2 by FDA under an Emergency Use Authorization (EUA). This EUA will remain  in effect (meaning this test can be used) for  the duration of the COVID-19 declaration  under Section 564(b)(1) of the Act, 21 U.S.C.section 360bbb-3(b)(1), unless the authorization is terminated  or revoked sooner.       Influenza A by PCR NEGATIVE NEGATIVE Final   Influenza B by PCR NEGATIVE NEGATIVE Final    Comment: (NOTE) The Xpert Xpress SARS-CoV-2/FLU/RSV plus assay is intended as an aid in the diagnosis of influenza from Nasopharyngeal swab specimens and should not be used as a sole basis for treatment. Nasal washings and aspirates are unacceptable for Xpert Xpress SARS-CoV-2/FLU/RSV testing.  Fact Sheet for Patients: EntrepreneurPulse.com.au  Fact Sheet for Healthcare Providers: IncredibleEmployment.be  This test is not yet approved or cleared by the [place] FDA and has been authorized for detection and/or diagnosis of SARS-CoV-2 by FDA under an Emergency Use Authorization (EUA). This EUA will remain in effect (meaning this test can be used) for the duration of the COVID-19 declaration under Section 564(b)(1) of the Act, 21 U.S.C. section 360bbb-3(b)(1), unless the authorization is terminated or revoked.  Performed at [facility], [address] [place], [place] [address]   MRSA Next Gen by PCR, Nasal     Status: None   Collection Time: 04/21/21  8:50 AM   Specimen: Nasal Mucosa; Nasal Swab  Result Value Ref Range Status   MRSA by PCR Next Gen NOT DETECTED NOT DETECTED Final    Comment: (NOTE) The GeneXpert MRSA Assay (FDA approved for NASAL specimens only), is one component of a comprehensive MRSA colonization surveillance program. It is not intended to diagnose MRSA infection nor to guide or monitor treatment for MRSA infections. Test performance is not FDA approved in patients less than 43 years old. Performed at [facility], [address] [place], [place] [address]     RN Pressure Injury Documentation:     Estimated body mass index  is 26.11 kg/m as calculated from the following:   Height as of this encounter: 5\' 9"  (1.753 m).   Weight as of this encounter: 80.2 kg.  Malnutrition Type:   Malnutrition Characteristics:   Nutrition Interventions:   Radiology Studies: DG Chest Port 1 View  Result Date: 04/23/2021 CLINICAL DATA:  Shortness of breath. EXAM: PORTABLE CHEST 1 VIEW COMPARISON:  Portable chest 04/21/2021, chest CT 04/17/2021. FINDINGS: Right IJ dialysis catheter is stable, with tip at the superior cavoatrial junction. Complex and sizable encompassing left hydropneumothorax appears similar to the prior studies. There is associated atelectasis or consolidation in the partially aerated left lung, more so in the lower zone. On the right, moderate layering pleural effusion persists unchanged. There is hazy atelectasis or pneumonia in the right base alongside the effusion, aside from which right lung is otherwise clear. Cardiac size is stable but not optimally seen. There is mild central vascular distension. Stable mediastinum. IMPRESSION: Stable overall aeration. There is perihilar vascular congestion without appreciable edema. Sizable encompassing and complex left hydropneumothorax shows no obvious interval change as well as underlying opacities in the compressed left lung. Layering right pleural fluid appears stable as well as hazy overlying atelectasis or consolidation. Electronically Signed   By: Telford Nab M.D.   On: 04/23/2021 05:10    Scheduled Meds:  aspirin  325 mg Oral Daily   calcium acetate  1,334 mg Oral TID WC   Chlorhexidine Gluconate Cloth  6 each Topical Q0600   epoetin alfa       epoetin (EPOGEN/PROCRIT) injection  10,000 Units Intravenous Q M,W,F-HD   guaiFENesin  1,200 mg Oral BID   heparin  5,000 Units Subcutaneous  Q8H   heparin sodium (porcine)       ipratropium  0.5 mg Nebulization BID   levalbuterol  0.63 mg Nebulization BID   minoxidil  5 mg Oral Daily   ondansetron (ZOFRAN) IV  4 mg  Intravenous Once   pantoprazole  40 mg Oral Daily   sodium chloride flush  3 mL Intravenous Q12H   Continuous Infusions:  sodium chloride     sodium chloride     sodium chloride      LOS: 6 days   [place], DO [facility] PAGER is on AMION  If 7PM-7AM, please contact night-coverage [email or web address]

## 2021-04-24 NOTE — Care Management Important Message (Signed)
Important Message  Patient Details  Name: Travis Long MRN: [id number] Date of Birth: 1985/11/11   Medicare Important Message Given:  Yes     Juliann Pulse A Kare Dado 04/24/2021, 2:04 PM

## 2021-04-24 NOTE — Progress Notes (Signed)
HD notes, pt did not complete his 4 hours HD treatment time. Pt still have 30 minutes remaining time on the machine. Pt had several episodes of hypotension during treatment. 1st episode of low BP pt was symptomatic,, pt was vomiting, UF was off, UF goal was decreased and  200cc saline flushed was given, repeat bp still low, another 200cc nss was given. Pt bp back up, bp was monitored closely and UF was turned on again. Dr Holley Raring was made aware. 2nd episode of hypotension, pt was asymptomatic. UF off turned off, Dr Holley Raring was notified. BP back up to 123/49. UF turned on again.12:45 BP was low again at 96/40 pt asymptomatic. Pt wanted to remove total of 4L UF but pt was informed that we cannot pull 4L d/t his BP dropping. Dr Holley Raring was made aware and he talked to the pt.  He ordered to do 2L UF net. 12:50 Bp is 130/48 UF turned on. 13:45 Bp was low again, pt is asymptomatic. Repeat BP still Low. UF turned off , then Hd terminated and pt also requested to be off the machine. Dr Holley Raring was notified by Eliane Decree. Dr Holley Raring agreed. Bp back up to 105/51. At 1415 bp was 110/65. Pt was asymptomatic.   Pt Total UF removed. 10101ml

## 2021-04-24 NOTE — Progress Notes (Signed)
[facility]  ROUNDING NOTE   Subjective:   Travis Long is a 36 year old African-American male with past medical concerns including hypertension and end-stage renal disease on dialysis.  Patient presents to the emergency department from his outpatient dialysis center with complaints of shortness of breath.  Patient will be admitted under observation for Acute pulmonary edema (Travis Long) [J81.0] Volume overload [E87.70]  Patient seen and evaluated during dialysis treatment. BP slightly low.  UF target 2kg.    Objective:  Vital signs in last 24 hours:  Temp:  [98 F (36.7 C)-98.8 F (37.1 C)] 98.8 F (37.1 C) (01/30 1000) Pulse Rate:  [88-106] 95 (01/30 1215) Resp:  [15-29] 17 (01/30 1215) BP: (80-151)/(37-83) 123/49 (01/30 1215) SpO2:  [92 %-100 %] 93 % (01/30 1215) Weight:  [83.2 kg] 83.2 kg (01/30 1000)  Weight change:  Filed Weights   04/21/21 0945 04/21/21 1440 04/24/21 1000  Weight: 81.4 kg 77.8 kg 83.2 kg    Intake/Output: I/O last 3 completed shifts: In: 720 [P.O.:720] Out: -    Intake/Output this shift:  No intake/output data recorded.  Physical Exam: General: NAD, resting in bed  Head: Normocephalic, atraumatic. Moist oral mucosal membranes  Eyes: Anicteric  Lungs:  Clear to auscultation, normal effort, 2 L [place]  Heart: Regular rate and rhythm  Abdomen:  Soft, nontender  Extremities: No peripheral edema.  Neurologic: Nonfocal, moving all four extremities  Skin: No lesions  Access: Right IJ PermCath, Lt lower AVF    Basic Metabolic Panel: Recent Labs  Lab 04/19/21 0925 04/20/21 0543 04/21/21 0617 04/22/21 0544 04/23/21 0647  NA 133* 132* 130* 134* 131*  K 5.0 4.3 4.8 4.4 4.6  CL 93* 93* 87* 90* 89*  CO2 25 27 28 29 28   GLUCOSE 108* 84 115* 98 117*  BUN 56* 38* 51* 28* 46*  CREATININE 12.99* 9.22* 12.40* 8.07* 11.64*  CALCIUM 9.0 8.9 8.7* 9.3 8.9  MG 1.9 1.7 1.7 1.7 1.8  PHOS 7.5* 6.1* 6.7* 6.0* 7.0*     Liver Function Tests: Recent  Labs  Lab 04/19/21 0925 04/20/21 0543 04/21/21 0617 04/22/21 0544 04/23/21 0647  AST 11* 10* 13* 11* 13*  ALT 7 7 6 6 7   ALKPHOS 55 44 51 55 53  BILITOT 0.5 0.2* 0.4 0.4 0.3  PROT 8.0 7.9 8.0 8.3* 8.2*  ALBUMIN 3.3* 3.2* 3.2* 3.4* 3.4*    No results for input(s): LIPASE, AMYLASE in the last 168 hours. No results for input(s): AMMONIA in the last 168 hours.  CBC: Recent Labs  Lab 04/19/21 0925 04/20/21 0543 04/21/21 0617 04/22/21 0544 04/23/21 0647  WBC 7.9 7.9 10.7* 9.4 11.4*  NEUTROABS 5.4 5.0 8.2* 6.5 8.6*  HGB 7.6* 7.4* 7.4* 8.0* 7.6*  HCT 22.9* 22.2* 22.7* 25.0* 23.6*  MCV 94.6 94.1 95.8 94.0 95.9  PLT 247 265 273 306 285     Cardiac Enzymes: No results for input(s): CKTOTAL, CKMB, CKMBINDEX, TROPONINI in the last 168 hours.  BNP: Invalid input(s): POCBNP  CBG: No results for input(s): GLUCAP in the last 168 hours.  Microbiology: Results for orders placed or performed during the hospital encounter of 04/17/21  Resp Panel by RT-PCR (Flu A&B, Covid) Nasopharyngeal Swab     Status: None   Collection Time: 04/17/21  1:00 PM   Specimen: Nasopharyngeal Swab; Nasopharyngeal(NP) swabs in vial transport medium  Result Value Ref Range Status   SARS Coronavirus 2 by RT PCR NEGATIVE NEGATIVE Final    Comment: (NOTE) SARS-CoV-2 target nucleic acids  are NOT DETECTED.  The SARS-CoV-2 RNA is generally detectable in upper respiratory specimens during the acute phase of infection. The lowest concentration of SARS-CoV-2 viral copies this assay can detect is 138 copies/mL. A negative result does not preclude SARS-Cov-2 infection and should not be used as the sole basis for treatment or other patient management decisions. A negative result may occur with  improper specimen collection/handling, submission of specimen other than nasopharyngeal swab, presence of viral mutation(s) within the areas targeted by this assay, and inadequate number of viral copies(<138 copies/mL).  A negative result must be combined with clinical observations, patient history, and epidemiological information. The expected result is Negative.  Fact Sheet for Patients:  EntrepreneurPulse.com.au  Fact Sheet for Healthcare Providers:  IncredibleEmployment.be  This test is no t yet approved or cleared by the [place] FDA and  has been authorized for detection and/or diagnosis of SARS-CoV-2 by FDA under an Emergency Use Authorization (EUA). This EUA will remain  in effect (meaning this test can be used) for the duration of the COVID-19 declaration under Section 564(b)(1) of the Act, 21 U.S.C.section 360bbb-3(b)(1), unless the authorization is terminated  or revoked sooner.       Influenza A by PCR NEGATIVE NEGATIVE Final   Influenza B by PCR NEGATIVE NEGATIVE Final    Comment: (NOTE) The Xpert Xpress SARS-CoV-2/FLU/RSV plus assay is intended as an aid in the diagnosis of influenza from Nasopharyngeal swab specimens and should not be used as a sole basis for treatment. Nasal washings and aspirates are unacceptable for Xpert Xpress SARS-CoV-2/FLU/RSV testing.  Fact Sheet for Patients: EntrepreneurPulse.com.au  Fact Sheet for Healthcare Providers: IncredibleEmployment.be  This test is not yet approved or cleared by the [place] FDA and has been authorized for detection and/or diagnosis of SARS-CoV-2 by FDA under an Emergency Use Authorization (EUA). This EUA will remain in effect (meaning this test can be used) for the duration of the COVID-19 declaration under Section 564(b)(1) of the Act, 21 U.S.C. section 360bbb-3(b)(1), unless the authorization is terminated or revoked.  Performed at [facility], [address] [place], [place] [address]   MRSA Next Gen by PCR, Nasal     Status: None   Collection Time: 04/21/21  8:50 AM   Specimen: Nasal Mucosa; Nasal Swab  Result Value Ref  Range Status   MRSA by PCR Next Gen NOT DETECTED NOT DETECTED Final    Comment: (NOTE) The GeneXpert MRSA Assay (FDA approved for NASAL specimens only), is one component of a comprehensive MRSA colonization surveillance program. It is not intended to diagnose MRSA infection nor to guide or monitor treatment for MRSA infections. Test performance is not FDA approved in patients less than 49 years old. Performed at [facility], [address] [place], [place] [address]     Coagulation Studies: No results for input(s): LABPROT, INR in the last 72 hours.  Urinalysis: No results for input(s): COLORURINE, LABSPEC, PHURINE, GLUCOSEU, HGBUR, BILIRUBINUR, KETONESUR, PROTEINUR, UROBILINOGEN, NITRITE, LEUKOCYTESUR in the last 72 hours.  Invalid input(s): APPERANCEUR    Imaging: DG Chest Port 1 View  Result Date: 04/23/2021 CLINICAL DATA:  Shortness of breath. EXAM: PORTABLE CHEST 1 VIEW COMPARISON:  Portable chest 04/21/2021, chest CT 04/17/2021. FINDINGS: Right IJ dialysis catheter is stable, with tip at the superior cavoatrial junction. Complex and sizable encompassing left hydropneumothorax appears similar to the prior studies. There is associated atelectasis or consolidation in the partially aerated left lung, more so in the lower zone. On the right,  moderate layering pleural effusion persists unchanged. There is hazy atelectasis or pneumonia in the right base alongside the effusion, aside from which right lung is otherwise clear. Cardiac size is stable but not optimally seen. There is mild central vascular distension. Stable mediastinum. IMPRESSION: Stable overall aeration. There is perihilar vascular congestion without appreciable edema. Sizable encompassing and complex left hydropneumothorax shows no obvious interval change as well as underlying opacities in the compressed left lung. Layering right pleural fluid appears stable as well as hazy overlying atelectasis or consolidation.  Electronically Signed   By: Telford Nab M.D.   On: 04/23/2021 05:10     Medications:    sodium chloride     sodium chloride     sodium chloride      amLODipine  10 mg Oral Daily   aspirin  325 mg Oral Daily   calcium acetate  1,334 mg Oral TID WC   carvedilol  25 mg Oral BID WC   Chlorhexidine Gluconate Cloth  6 each Topical Q0600   epoetin alfa       epoetin (EPOGEN/PROCRIT) injection  10,000 Units Intravenous Q M,W,F-HD   guaiFENesin  1,200 mg Oral BID   heparin  5,000 Units Subcutaneous Q8H   heparin sodium (porcine)       hydrALAZINE  100 mg Oral Q8H   ipratropium  0.5 mg Nebulization BID   levalbuterol  0.63 mg Nebulization BID   minoxidil  5 mg Oral Daily   ondansetron (ZOFRAN) IV  4 mg Intravenous Once   pantoprazole  40 mg Oral Daily   sodium chloride flush  3 mL Intravenous Q12H     Assessment/ Plan:  [name] is a 35 y.o.  male past medical concerns including hypertension and end-stage renal disease on dialysis.  Patient presents to the emergency department from his outpatient dialysis center with complaints of shortness of breath.  Patient will be admitted under observation for Acute pulmonary edema (HCC) [J81.0] Volume overload [E87.70]  [facility]/MWF/right IJ PermCath  ESRD.  Patient seen and evaluated during hemodialysis treatment.  Tolerating well thus far.  Did have hypotension earlier in the treatment but blood pressure recovered.  2. Anemia of chronic kidney disease  Lab Results  Component Value Date   HGB 7.6 (L) 04/23/2021  Maintain the patient on Epogen 10,000 IV with dialysis.  Maintain the patient on calcium acetate 2 tablets p.o. 3 times daily with meals.  Serum phosphorus 7.2 at last check.  3. Secondary Hyperparathyroidism:   Lab Results  Component Value Date   CALCIUM 8.9 04/23/2021   PHOS 7.0 (H) 04/23/2021    Continue calcium acetate, 2 capsules with meals.  4.  Hypertension with chronic kidney disease.   Continue amlodipine, carvedilol, hydralazine, minoxidil.   LOS: 6 Travis Long 1/30/202312:42 PM

## 2021-04-25 ENCOUNTER — Inpatient Hospital Stay: Payer: Medicare Other

## 2021-04-25 LAB — CBC WITH DIFFERENTIAL/PLATELET
Abs Immature Granulocytes: 0.05 10*3/uL (ref 0.00–0.07)
Basophils Absolute: 0 10*3/uL (ref 0.0–0.1)
Basophils Relative: 1 %
Eosinophils Absolute: 0.8 10*3/uL — ABNORMAL HIGH (ref 0.0–0.5)
Eosinophils Relative: 10 %
HCT: 22.9 % — ABNORMAL LOW (ref 39.0–52.0)
Hemoglobin: 7.3 g/dL — ABNORMAL LOW (ref 13.0–17.0)
Immature Granulocytes: 1 %
Lymphocytes Relative: 10 %
Lymphs Abs: 0.8 10*3/uL (ref 0.7–4.0)
MCH: 30.7 pg (ref 26.0–34.0)
MCHC: 31.9 g/dL (ref 30.0–36.0)
MCV: 96.2 fL (ref 80.0–100.0)
Monocytes Absolute: 1.4 10*3/uL — ABNORMAL HIGH (ref 0.1–1.0)
Monocytes Relative: 17 %
Neutro Abs: 5.2 10*3/uL (ref 1.7–7.7)
Neutrophils Relative %: 61 %
Platelets: 314 10*3/uL (ref 150–400)
RBC: 2.38 MIL/uL — ABNORMAL LOW (ref 4.22–5.81)
RDW: 14.1 % (ref 11.5–15.5)
WBC: 8.3 10*3/uL (ref 4.0–10.5)
nRBC: 0 % (ref 0.0–0.2)

## 2021-04-25 LAB — COMPREHENSIVE METABOLIC PANEL
ALT: 7 U/L (ref 0–44)
AST: 13 U/L — ABNORMAL LOW (ref 15–41)
Albumin: 3.2 g/dL — ABNORMAL LOW (ref 3.5–5.0)
Alkaline Phosphatase: 58 U/L (ref 38–126)
Anion gap: 13 (ref 5–15)
BUN: 39 mg/dL — ABNORMAL HIGH (ref 6–20)
CO2: 29 mmol/L (ref 22–32)
Calcium: 8.9 mg/dL (ref 8.9–10.3)
Chloride: 88 mmol/L — ABNORMAL LOW (ref 98–111)
Creatinine, Ser: 10.32 mg/dL — ABNORMAL HIGH (ref 0.61–1.24)
GFR, Estimated: 6 mL/min — ABNORMAL LOW (ref >60)
Glucose, Bld: 134 mg/dL — ABNORMAL HIGH (ref 70–99)
Potassium: 4.1 mmol/L (ref 3.5–5.1)
Sodium: 130 mmol/L — ABNORMAL LOW (ref 135–145)
Total Bilirubin: 0.2 mg/dL — ABNORMAL LOW (ref 0.3–1.2)
Total Protein: 8.4 g/dL — ABNORMAL HIGH (ref 6.5–8.1)

## 2021-04-25 LAB — MAGNESIUM: Magnesium: 2 mg/dL (ref 1.7–2.4)

## 2021-04-25 LAB — PHOSPHORUS: Phosphorus: 6 mg/dL — ABNORMAL HIGH (ref 2.5–4.6)

## 2021-04-25 MED ORDER — ONDANSETRON HCL 4 MG/2ML IJ SOLN
4.0000 mg | Freq: Four times a day (QID) | INTRAMUSCULAR | Status: DC | PRN
  Administered 2021-04-25: 19:00:00 4 mg via INTRAVENOUS
  Filled 2021-04-25: qty 2

## 2021-04-25 NOTE — Progress Notes (Signed)
PULMONOLOGY         Date: 04/25/2021,   MRN# [id number] Travis Long [birth year]     AdmissionWeight: 79.4 kg                 CurrentWeight: 80.2 kg   Referring physician: Dr Theone Murdoch   CHIEF COMPLAINT:   Recurrent pleural effusion - bilateral worse on left   HISTORY OF PRESENT ILLNESS   This is a 36 yo male with hx of recurrent pleural effusion with hx of ESRD on HD MWF s/p R IJ HD access placed. PCCM consulted to evaluate recurrent pleural left pleural effusion.   I reviewed CT chest with findings of bronchiectatic lungs and overlying moderate pleural effusions bilaterally with ex-vacuo physiology on right hemithorax due to rounded atelectasis and what appears to be lung entrapment.  He had thoracic surgery evaluation at [facility] and it was felt by surgery that it would be more harm to patient if he was to undergo decortication and invasive thoracoscopic management.He should be able to recruit more lung function via diuresis or more aggressive HD due to findings of bilateral effusions with compressive atelectasis.    04/20/21- patient is stable for dc home on room air. He is asked to follow up with [facility] or [facility] with thoracic surgery due to advanced complex loculated left pleural effusion with lung entrapment and ex-vacuo physiology. Patient is thankful and agreeable to plan.   04/24/21- patient is stable on room air, he reports no new events overnight. He is improved and has better ventilation on left chest during auscultation. He had CXR with slight left sided improvement. He continues to have HD and reports right neck discomfort from catheter. We discussed outpatient follow up with [facility] thoracic surgery for additional options to treat chronic hydropneumothorax. He wishes to take least risky approach and shares he is young and has children does not want to do anything with elevated risk.  PAST MEDICAL HISTORY   Past Medical History:  Diagnosis Date   ESRD on  hemodialysis [facility])    Hypertension      SURGICAL HISTORY   Past Surgical History:  Procedure Laterality Date   A/V FISTULAGRAM Left 03/09/2021   Procedure: A/V Fistulagram;  Surgeon: Algernon Huxley, MD;  Location: [place] [facility];  Service: Cardiovascular;  Laterality: Left;   AV FISTULA PLACEMENT Left    DIALYSIS/PERMA CATHETER INSERTION N/A 04/18/2021   Procedure: DIALYSIS/PERMA CATHETER INSERTION;  Surgeon: Katha Cabal, MD;  Location: [place] [facility];  Service: Cardiovascular;  Laterality: N/A;   IR CATHETER TUBE CHANGE  03/09/2021     FAMILY HISTORY   Family History  Problem Relation Age of Onset   Hypertension Mother    Diabetes Mellitus II Mother      SOCIAL HISTORY   Social History   Tobacco Use   Smoking status: Former    Types: Cigarettes   Smokeless tobacco: Never  Substance Use Topics   Alcohol use: Not Currently   Drug use: Never     MEDICATIONS    Home Medication:    Current Medication:  Current Facility-Administered Medications:    0.9 %  sodium chloride infusion, 100 mL, Intravenous, PRN, Schnier, [name], MD   0.9 %  sodium chloride infusion, 100 mL, Intravenous, PRN, Schnier, [name], MD   0.9 %  sodium chloride infusion, 250 mL, Intravenous, PRN, Schnier, [name], MD   alteplase (CATHFLO ACTIVASE) injection 2 mg, 2 mg, Intracatheter, Once  PRN, Delana Meyer, [name], MD   aspirin tablet 325 mg, 325 mg, Oral, Daily, Schnier, [name], MD, 325 mg at 04/21/21 1534   calcium acetate (PHOSLO) capsule 1,334 mg, 1,334 mg, Oral, TID WC, Schnier, [name], MD, 1,334 mg at 04/24/21 1747   Chlorhexidine Gluconate Cloth 2 % PADS 6 each, 6 each, Topical, Q0600, Schnier, [name], MD, 6 each at 04/19/21 0730   epoetin alfa (EPOGEN) injection 10,000 Units, 10,000 Units, Intravenous, Q M,W,F-HD, Breeze, Shantelle, NP, 10,000 Units at 04/24/21 1214   guaiFENesin (MUCINEX) 12 hr tablet 1,200 mg, 1,200 mg, Oral, BID, Sheikh, Omair Latif, DO    heparin injection 1,000 Units, 1,000 Units, Dialysis, PRN, Delana Meyer, [name], MD, 10,000 Units at 04/19/21 1341   heparin injection 5,000 Units, 5,000 Units, Subcutaneous, Q8H, Schnier, [name], MD, 5,000 Units at 04/18/21 2308   HYDROcodone-acetaminophen (NORCO/VICODIN) 5-325 MG per tablet 1 tablet, 1 tablet, Oral, Q6H PRN, Max Sane, MD, 1 tablet at 04/24/21 0635   ipratropium (ATROVENT) nebulizer solution 0.5 mg, 0.5 mg, Nebulization, BID, Sheikh, Omair Latif, DO, 0.5 mg at 04/25/21 0749   levalbuterol (XOPENEX) nebulizer solution 0.63 mg, 0.63 mg, Nebulization, BID, Sheikh, Omair Latif, DO, 0.63 mg at 04/25/21 0749   lidocaine (PF) (XYLOCAINE) 1 % injection 5 mL, 5 mL, Intradermal, PRN, Schnier, [name], MD   lidocaine-prilocaine (EMLA) cream 1 application, 1 application, Topical, PRN, Schnier, [name], MD   minoxidil (LONITEN) tablet 5 mg, 5 mg, Oral, Daily, Schnier, [name], MD, 5 mg at 04/24/21 0845   ondansetron (ZOFRAN) injection 4 mg, 4 mg, Intravenous, Once, Foust, Katy L, NP   pantoprazole (PROTONIX) EC tablet 40 mg, 40 mg, Oral, Daily, Schnier, [name], MD   pentafluoroprop-tetrafluoroeth (GEBAUERS) aerosol 1 application, 1 application, Topical, PRN, Schnier, [name], MD   sodium chloride flush (NS) 0.9 % injection 3 mL, 3 mL, Intravenous, Q12H, Schnier, [name], MD, 3 mL at 04/23/21 2127   sodium chloride flush (NS) 0.9 % injection 3 mL, 3 mL, Intravenous, PRN, Schnier, [name], MD    ALLERGIES   Patient has no known allergies.     REVIEW OF SYSTEMS    Review of Systems:  Gen:  Denies  fever, sweats, chills weigh loss  HEENT: Denies blurred vision, double vision, ear pain, eye pain, hearing loss, nose bleeds, sore throat Cardiac:  No dizziness, chest pain or heaviness, chest tightness,edema Resp:   Denies cough or sputum porduction, shortness of breath,wheezing, hemoptysis,  Gi: Denies swallowing difficulty, stomach pain, nausea or vomiting, diarrhea,  constipation, bowel incontinence Gu:  Denies bladder incontinence, burning urine Ext:   Denies Joint pain, stiffness or swelling Skin: Denies  skin rash, easy bruising or bleeding or hives Endoc:  Denies polyuria, polydipsia , polyphagia or weight change Psych:   Denies depression, insomnia or hallucinations   Other:  All other systems negative   VS: BP (!) 166/84 (BP Location: Right Arm)    Pulse 100    Temp 97.6 F (36.4 C) (Oral)    Resp 18    Ht 5\' 9"  (1.753 m)    Wt 80.2 kg    SpO2 96%    BMI 26.11 kg/m      PHYSICAL EXAM    GENERAL:NAD, no fevers, chills, no weakness no fatigue HEAD: Normocephalic, atraumatic.  EYES: Pupils equal, round, reactive to light. Extraocular muscles intact. No scleral icterus.  MOUTH: Moist mucosal membrane. Dentition intact. No abscess noted.  EAR, NOSE, THROAT: Clear without exudates.  No external lesions.  NECK: Supple. No thyromegaly. No nodules. No JVD.  PULMONARY: decreased air entry bilaterally worse on left CARDIOVASCULAR: S1 and S2. Regular rate and rhythm. No murmurs, rubs, or gallops. No edema. Pedal pulses 2+ bilaterally.  GASTROINTESTINAL: Soft, nontender, nondistended. No masses. Positive bowel sounds. No hepatosplenomegaly.  MUSCULOSKELETAL: No swelling, clubbing, or edema. Range of motion full in all extremities.  NEUROLOGIC: Cranial nerves II through XII are intact. No gross focal neurological deficits. Sensation intact. Reflexes intact.  SKIN: No ulceration, lesions, rashes, or cyanosis. Skin warm and dry. Turgor intact.  PSYCHIATRIC: Mood, affect within normal limits. The patient is awake, alert and oriented x 3. Insight, judgment intact.       IMAGING    DG Chest 2 View  Result Date: 04/18/2021 CLINICAL DATA:  Shortness of breath.  End-stage renal disease. EXAM: CHEST - 2 VIEW COMPARISON:  04/17/2021 FINDINGS: No critical change since yesterday. Central line tip in the SVC above the right atrium. Bilateral pleural fluid  collections, larger on the left than the right. Multiple air-fluid levels on the left appears similar. Associated pulmonary atelectasis/infiltrate appears the same. IMPRESSION: No change since yesterday. Bilateral effusions, larger on the left than the right, with multiple air-fluid levels on the left. Associated pulmonary volume loss/infiltrate. Electronically Signed   By: Nelson Chimes M.D.   On: 04/18/2021 10:21   DG Chest 2 View  Result Date: 04/17/2021 CLINICAL DATA:  Shortness of breath.  Dialysis. EXAM: CHEST - 2 VIEW COMPARISON:  March 15, 2021 FINDINGS: Dual lumen dialysis catheter with tip projecting over the superior cavoatrial junction. The heart size and mediastinal contours are obscured. Large right pleural effusion with atelectasis versus infiltrate involving the left lung. Two focal gas fluid levels in the left hemithorax 1 of the mid lung level the other at the apex may reflect focal areas of aerated lung but abscess not excluded consider further evaluation with chest CT. Small right pleural effusion. Bilateral pulmonary edema. The visualized skeletal structures are unchanged. IMPRESSION: Large left pleural effusion with a combination of edema and atelectasis versus infiltrate in the left lung, additionally there are 2 focal gas fluid-levels in the left hemithorax 1 of the mid lung level the other in the apex which may reflect focal areas of aerated lung but abscess not excluded, consider further evaluation with chest CT. Small right pleural effusion with right lung atelectasis and edema. Electronically Signed   By: Dahlia Bailiff M.D.   On: 04/17/2021 11:19   CT Chest Wo Contrast  Result Date: 04/17/2021 CLINICAL DATA:  Pneumonia. Complication suspected. Abnormal chest radiograph. After 3 hours of dialysis treatment today common dialysis machine with stopping was told he needed dialysis access changed out. Hypertension. Patient has not felt well for 1 week. EXAM: CT CHEST WITHOUT CONTRAST  TECHNIQUE: Multidetector CT imaging of the chest was performed following the standard protocol without IV contrast. RADIATION DOSE REDUCTION: This exam was performed according to the departmental dose-optimization program which includes automated exposure control, adjustment of the mA and/or kV according to patient size and/or use of iterative reconstruction technique. COMPARISON:  Chest radiographs 04/17/2021, AP chest 03/15/2021, 03/09/2021, 09/16/2020; CT chest 03/06/2021 FINDINGS: Right internal jugular dual-lumen central venous catheter with tips within the central superior vena cava. Cardiovascular: Heart size is again mildly enlarged. Trace pericardial fluid is slightly increased from prior. No thoracic aortic aneurysm. Mediastinum/Nodes: No pathologically enlarged axillary lymph nodes by CT criteria. There are again multiple mildly enlarged mediastinal lymph nodes. The largest  is again seen within the inferior right paratracheal region measuring up to 1.3 cm in short axis, not significantly changed from prior when measured in a similar manner. Again these are likely reactive. No definite hilar lymphadenopathy is seen, within the limitations of lack of IV contrast.The thyroid gland is grossly unremarkable. The esophagus follows a normal course of normal caliber. Lungs/Pleura: There is again opacification of the distal segmental left lower lobe airways. There is again high-grade consolidation within the posterior left lower lobe. Moderate bilateral pleural effusions are mildly increased from prior. Interval increase in air component of left-sided hydropneumothorax. This includes an air component measuring up to approximately 2.8 cm in craniocaudal dimension on the current CT, increased from 2.1 cm on 03/06/2021 CT (as measured on sagittal series 6, image 122). There is also interval increase in air within the more inferior posterolateral hydropneumothorax component on this supine CT with air newly seen within  the non dependent regions of the left mid and lower lung (axial series 3, images 84 through 99). Previously a small amount of air was seen within the posteromedial left lower lobe pleural space, and this is still mild but slightly increased from prior (axial series 3 images 63 through 84). Overall there is interval increase in fluid and air components of this left-sided hydropneumothorax. There again mild right peripheral paraseptal cystic emphysematous changes. Upper Abdomen: There is partial visualization of the kidneys which demonstrate moderate to high-grade atrophy consistent with known chronic kidney disease. Moderate to high-grade bilateral gynecomastia is again noted. Musculoskeletal: There is diffuse increased bone density again seen, consistent with chronic renal failure. IMPRESSION:: IMPRESSION: 1. Compared to 03/06/2021, there is interval increase in the fluid and air components of a moderate left hydropneumothorax. Interval mild increase in moderate bilateral pleural effusions. There again appear to be multiple loculations within the hydropneumothorax, also increased from prior. 2. There is again high-grade consolidation of the posterior left lower lobe either pneumonia versus round atelectasis. 3. Note is made of prior 03/09/2021 basilar pleural pigtail catheter placement in the region of this hydropneumothorax on prior 03/09/2021 radiographs. Following removal of this pigtail catheter, the extrapleural air and fluid appears to have increased over time. Electronically Signed   By: Yvonne Kendall M.D.   On: 04/17/2021 14:17   PERIPHERAL VASCULAR CATHETERIZATION  Result Date: 04/18/2021 See surgical note for result.  DG Chest Port 1 View  Result Date: 04/25/2021 CLINICAL DATA:  Shortness of breath EXAM: PORTABLE CHEST 1 VIEW COMPARISON:  04/23/2021 FINDINGS: Dialysis catheter is again noted. Cardiac shadow is stable. Persistent bilateral effusions are noted. Left hydropneumothorax is again noted  and stable. Patchy basilar opacities are noted left greater than right. IMPRESSION: Stable appearance of the chest when compared with the prior exam. Electronically Signed   By: Inez Catalina M.D.   On: 04/25/2021 02:40   DG Chest Port 1 View  Result Date: 04/23/2021 CLINICAL DATA:  Shortness of breath. EXAM: PORTABLE CHEST 1 VIEW COMPARISON:  Portable chest 04/21/2021, chest CT 04/17/2021. FINDINGS: Right IJ dialysis catheter is stable, with tip at the superior cavoatrial junction. Complex and sizable encompassing left hydropneumothorax appears similar to the prior studies. There is associated atelectasis or consolidation in the partially aerated left lung, more so in the lower zone. On the right, moderate layering pleural effusion persists unchanged. There is hazy atelectasis or pneumonia in the right base alongside the effusion, aside from which right lung is otherwise clear. Cardiac size is stable but not optimally seen. There is mild  central vascular distension. Stable mediastinum. IMPRESSION: Stable overall aeration. There is perihilar vascular congestion without appreciable edema. Sizable encompassing and complex left hydropneumothorax shows no obvious interval change as well as underlying opacities in the compressed left lung. Layering right pleural fluid appears stable as well as hazy overlying atelectasis or consolidation. Electronically Signed   By: Telford Nab M.D.   On: 04/23/2021 05:10   DG Chest Port 1 View  Result Date: 04/21/2021 CLINICAL DATA:  Shortness of breath EXAM: PORTABLE CHEST 1 VIEW COMPARISON:  Previous studies including the examination of 04/18/2021 FINDINGS: Tip of right IJ dialysis catheter is seen at the junction of superior vena cava and right atrium. Bilateral pleural effusions are seen, more so on the left side. There is improvement in aeration of right lower lung fields. Air-fluid levels seen in the left lung are less evident, possibly due to semi upright position of the  patient in the current study. IMPRESSION: There is interval improvement in aeration of right lower lung fields suggesting decrease in atelectasis/pneumonia. Bilateral pleural effusions, more so on the left side. There are patchy densities in the left mid and left lower lung fields suggesting atelectasis/pneumonia. Electronically Signed   By: Elmer Picker M.D.   On: 04/21/2021 17:31      ASSESSMENT/PLAN   Bilateral pleural effusions worse on left with lung entrapment         - patient is not on oxygen therapy          - he had thoracic surgery evaluation and is not candidate for surgery          -he improved with HD          -patient has been referred to [facility] for second opinion we discussed this at length and he agrees to follow up on outpatient.  He is stable and does have episodes of transient hypoxemia with exertion and will have O2 prescribed.     ESRD on HD  - patient does have fluid overload and would benefit from aggressive removal of fluid to help with pleural effusions   Major depression    - patient lost job and car and this is due to medical disease, recommend outpatient follow up with psychiatry       Thank you for allowing me to participate in the care of this patient.    Patient/Family are satisfied with care plan and all questions have been answered.  This document was prepared using Dragon voice recognition software and may include unintentional dictation errors.     Ottie Glazier, M.D.  Division of [place]

## 2021-04-25 NOTE — Progress Notes (Signed)
PROGRESS NOTE    Travis Long  SWF:[id number] DOB: 08-13-1985 DOA: 04/17/2021 PCP: Pcp, No  Brief Narrative:  Patient is a 36 year old African-American male with past medical history significant for but not limited to ESRD on hemodialysis Monday Wednesday Friday, hypertension as well as other comorbidities who presented with pulmonary edema and volume overload with HD catheter malfunction.  On 04/17/2020 EKG was performed and 04/18/2021 is right IJ tunneled catheter was exchanged by vascular.  After that he had some pulsatile bleeding but is now improved.  He is underwent hemodialysis on 123 and is undergoing hemodialysis again today.  His hypertensive urgency is improved and he does continue to have some volume overload and flash pulmonary edema.  He had a CT of the chest done which showed a high-grade consolidation in the left lower lobe either pneumonia versus atelectasis as well as a hydropneumothorax that appears increased from prior CT scan done in December 2022.  He is seen by [name], CT surgery last hospitalization and case was discussed with Dr. Cyndia Bent from this hospitalization and Dr. Cyndia Bent feels that he has had a complex multiloculated left pleural effusion of several months of duration that was treated with a thoracentesis and pigtail catheter insertion for tPA at last hospitalization and he feels that this patient will never be a surgery candidate so CT surgery feels there is nothing to intervene on.  They feel that basically his lung will never fully re-expand due to scar tissue from his effusions.  Pulmonary Dr. Mortimer Fries was consluted and he felt they could not offer anything at this time and so subsequently Dr. Lanney Gins was consulted for further evaluation and management and to try to help set up CT Surgery at [facility] for Second opinion.  After further discussion with Dr. Lanney Gins he recommended trying to get the patient as dry as possible and recommends aggressive removal of fluid with  dialysis to help with his pleural effusions and then reimage the patient.  Patient was on room air this morning but continues to complain of shortness of breath.  We will dialyze the patient tomorrow and do an ambulatory home O2 screen and if he is stable we will have the patient follow-up with [facility] due to his advanced complex loculated pleural effusion with lung entrapment and ex vacuo physiology.  Patient was dialyzed Friday and tried a 4-hour treatment today with ultrafiltration goal of 4 L based on pulmonary recommendations to reduce the pulmonary effusion.  They also discussed with the outpatient clinic to times the patient.  Patient continues to be short of breath and will do an amatory home O2 screen and repeat chest x-ray after his dialysis to evaluate his pleural effusion repeat chest x-ray done and showed that his right-sided improved however his left side still has a very large pleural effusion.  Patient continues to complain of dyspnea and was able to ambulate without desaturating however ambulating up the stairs he desaturated to 86% and needed at least 3 L to remain above 90.  We will add flutter valve and incentive spirometry as well as Xopenex and Atrovent  Patient is again seen in dialysis yesterday and he was not feeling good at all and had several episodes of hypotension and nausea vomiting.  He was hypotensive intermittently and the goal was to try remove 4 L of ultrafiltration but patient was informed that they could not do it given his drop in blood pressure.  The patient's blood pressure continued to drop and only 1082 mL were able  to be removed.  Patient continued to not feel well with dialysis today and had to be given two 200 NS boluses during dialysis.  Home oxygen was set up for the patient today.  He continues to complain about his right [facility] causing some issues with his swallowing  Because of his incomplete hemodialysis session yesterday he was undergoing hemodialysis again today  but after his hemodialysis session he had some nausea and vomiting.  We will add antiemetics and continue to monitor carefully as he will be going back for his regularly scheduled dialysis session tomorrow.  He continues to be back and forth on supplemental oxygen.  Pulmonary requested that the patient have aggressive diuresis and fluid removal via hemodialysis but nephrology feels that they may have reached his available fluid limit and circulation.  Patient to be dialyzed tomorrow and if stable after dialysis we will repeat chest x-ray and if stable can likely be discharged home on oxygen given that he desaturates on ambulation with exertion.  Assessment & Plan:   Principal Problem:   Volume overload Active Problems:   ESRD on hemodialysis (HCC)   Hypertension   Anemia in ESRD (end-stage renal disease) (HCC)   Pleural effusion   Hypertensive urgency  ESRD on Hemodialysis Monday Wednesday Friday Hyperphosphatemia -Was dialyzed on Monday and was dialyzed again yesterday.  His HD catheter was changed by vascular on 04/18/2021 given that was his malfunctioning -Patient's BUNs/creatinine went from 43/10.12 -> 37/9.53 -> 56/12.99 -> 38/9.22 -> 51/12.40 -> 28/8.07 -> 46/11.64 and is now 39/10.32 -We will continue monitor for bleeding and he has had some dried blood from his temporary dialysis catheter.  He was dialyzed yesterday and had increased ultrafiltration treatment of 4 L. -Patient's Phos Level was 7.5 and is improved to 6.0 yesterday but is now back up to 7.0 yesterday and today was 6.0 -Vascular surgery is following and the next step is to perform the left wrist fistulogram to ensure that the outflow from the proximal forearm centrally is widely patent and once this is verified they will move forward with revision of his fistula in the operating room; patient continues to complain of bleeding from his tunneled dialysis catheter site.  I asked Dr. Lorenso Courier to evaluate and he felt the patient would  need a stitch but the patient was refused; continues to complain of his temporary hemodialysis catheter affecting his neck and swallowing -C/w Calcium Acetate 1334 mg p.o. 3 times daily with meals -Further care per Nephro and the plan was to increase his ultrafiltration with HD with a goal of 1 L/h at 13 mL/kg/hr' the goal of ultrafiltration will be 4 L  -He has scheduled dialysis session yesterday and unfortunately he was not able to get 4 L of ultrafiltration done given he continue to be intermittently nauseous and hypotensive.  His blood pressures medications have been held for now given that he dropped down to 69/34.  Because of his incomplete dialysis yesterday he is having a sequential treatment to his attempt further for removal.  Pulmonary has recommended increase his ultrafiltration goal rates but nephrology may feel that they may be she is available fluid limit in his circulation.  He will be going under hemodialysis again tomorrow at his regular scheduled dialysis session and repeat chest x-ray afterwards to evaluate. -Patient will need to be discharged home on supplemental oxygen given his shortness of breath on exertion  Nausea and vomiting -In the setting of above -Initiate antiemetics with IV ondansetron 4 mg  Anemia  of Chronic Kidney Disease -Nephrology is following and his hemoglobin is below target and dropped from 8.3 to 7.6 yesterday and his hemoglobin/hematocrit is now 7.4/22.2 -> 7.4/22.7 -> 8.0/25.0 -> 7.6/23.6 and finally repeated and is now 7.3/22.9 -Currently going to get EPO with dialysis sessions -Continue to monitor for signs and symptoms of bleeding; currently no overt bleeding noted but he did have some when his [facility] was placed,: Patient continues to complain of his [facility] bleeding daily and so I asked Dr. Lorenso Courier to evaluate and Dr. Ulis Rias with replacement of the stitch but patient has refused -Patient has had no bleeding in dialysis but has some dried blood around his [facility] and  on his dressing again today -Patient continues to be concerned about the bleeding and perseverating on this however since he is refuses nothing else that we can do Dr. Lorenso Courier has ordered a surgical dressing for the patient.  Hypertensive Urgency, now with blood pressures on the softer side during dialysis -Was present on admission likely in the setting of volume overload/pulmonary edema -This is resolved now -Blood pressure is now on the softer side and he appeared ill appearing -C/w Home Amlodipine 10 mg po Daily, Carvedilol 25 mg po BID, Hydralazine 100 mg po q8h, and Minoxidil 5 mg po Daily but have held these now given that he continued to have significant hypotension during his dialysis session today -Last blood pressure reading was improved and back to 148/83  Left lower lobe pneumonia with high-grade consolidation versus atelectasis and hydropneumothorax Bilateral Pleural Effusions  -Appears to be increased from CT scan on 03/06/2021 -He seen by CT surgery Dr. Cyndia Bent and Dr. Cyndia Bent felt hey feel that basically his lung will never fully re-expand due to scar tissue from his effusions and felt that CT surgery had nothing else to offer at this time and felt that his pleural biopsy of decortication would be high risk -Dr. Mortimer Fries  was consulted but he recommended getting a second opinion by CT surgery at [facility] or [facility] -Have consulted Dr. Lanney Gins for further evaluation and attempt to get him set up with [facility] CT surgery -Dr. Lanney Gins evaluated and the patient was not on oxygen therapy as he subjectively uses it when he feels short of breath.  He feels that the patient will need a second opinion referral but he recommends aggressively diuresing and removing fluid from the patient to help with his pleural effusions and then reimage to see how much lung entrapment the patient actually has if we can get him dry as possible -SpO2: (!) 84 % O2 Flow Rate (L/min): 2 L/min ; patient continues to wear  supplemental oxygen via nasal cannula and was back on 3 L today and needed for ambulation given that he desaturated to 86% -Chest x-ray from 04/18/2021 showed "No change since yesterday. Bilateral effusions, larger on the left than the right, with multiple air-fluid levels on the left. Associated pulmonary volume loss/infiltrate." -Repeat CXR this a.m. showed "Stable overall aeration. There is perihilar vascular congestion without appreciable edema. Sizable encompassing and complex left hydropneumothorax shows no obvious interval change as well as underlying opacities in the compressed left lung. Layering right pleural fluid appears stable as well as hazy overlying atelectasis or consolidation." -Ambulatory home O2 screen done yesterday and he did desaturate going up the stairs and will require supplemental oxygen 3 L so we have written this order -We will place him on Xopenex and Atrovent and continued guaifenesin, and flutter valve, incentive spirometry -I spoke with Dr.  Aleskerov and Dr. Theador Hawthorne and the plan was for the patient be dialyzed the day before yesterday and then anticipate discharge after dialysis with outpatient thoracic surgery evaluation but given his events in dialysis and hypotension he was unable to get much fluid taken off.  We will defer further fluid removal to nephrology but patient did not feel well yesterday and looked ill-appearing with nausea and vomiting; underwent hemodialysis again today and did okay afterwards then became nauseous again and started vomiting. -Nephrology feels that may have reached the limit of fluid removal and circulation.  Anticipating discharge if stable after dialysis  Hyponatremia -Sodium has gone from 134 -> 133 -> 132 -> 130 -> 134 -> 131 and is now 130 -Continue monitor and trend and to be addressed in dialysis -Repeat CMP in a.m.  Leukocytosis -Patient's WBC went from 7.9 -> 10.7 -> 9.4 -> 11.4 -> 8.3 -Likely this is reactive -Monitor and trend  and repeat CBC in the a.m.  GERD/GI Prophylaxis -C/w Pantoprazole 40 mg   DVT prophylaxis: Heparin 5,000 units sq q8h Code Status: FULL CODE  Family Communication: No family present at bedside  Disposition Plan: Pending further clinical Improvement and clearance by specialists and anticipating discharging after hemodialysis tomorrow after he is stable repeat chest x-ray is improved  Status is: Inpatient  Remains inpatient appropriate because: He needs further work-up and improvement in his respiratory status prior to safe discharge disposition and clearance by pulmonary as well as vascular surgery  Consultants:  PCCM/Pulmonary Dr. Lanney Gins Discussed the case with Dr. Cyndia Bent of Cardiothoracic Surgery Nephrology Vascular surgery  Procedures: HD  Duration of a tunneled dialysis catheter of the right IJ with the same venous approach by Dr. Delana Meyer on 04/18/21  Antimicrobials:  Anti-infectives (From admission, onward)    Start     Dose/Rate Route Frequency Ordered Stop   04/19/21 0600  ceFAZolin (ANCEF) IVPB 2g/100 mL premix        2 g 200 mL/hr over 30 Minutes Intravenous On call to O.R. 04/18/21 1203 04/20/21 0559   04/18/21 1441  ceFAZolin (ANCEF) 1-4 GM/50ML-% IVPB       Note to Pharmacy: Corlis Hove H: cabinet override      04/18/21 1441 04/19/21 0244        Subjective: Seen and examined at bedside again in the dialysis unit and states he is feeling little bit better today.  States that he had significant nausea vomiting and felt terrible yesterday.  Had 2 L removed today but then subsequently after his hemodialysis session became nauseous and vomited again.  He was started on antiemetics.  Plan is to continue aggressive volume removal and repeat chest x-ray tomorrow after dialysis and it is stable and his breathing is improved likely can be discharged home with home supplemental oxygen via nasal with 3 L and outpatient specialist follow-up with [facility] with cardiothoracic  surgery evaluation.  Objective: Vitals:   04/25/21 1400 04/25/21 1404 04/25/21 1415 04/25/21 1656  BP: 116/64  135/72 (!) 148/83  Pulse: 87 90 88 (!) 101  Resp: (!) 21 (!) 25 (!) 21 18  Temp:   99 F (37.2 C) 99.1 F (37.3 C)  TempSrc:    Oral  SpO2: 99% 100% 100% (!) 84%  Weight:   80.4 kg   Height:        Intake/Output Summary (Last 24 hours) at 04/25/2021 1825 Last data filed at 04/25/2021 1404 Gross per 24 hour  Intake 240 ml  Output 2000 ml  Net -  1760 ml    Filed Weights   04/24/21 1415 04/25/21 1145 04/25/21 1415  Weight: 80.2 kg 82.8 kg 80.4 kg   Examination: Physical Exam:  Constitutional: WN/WD overweight African-American male currently no acute distress and just started his dialysis session Eyes: Lids and conjunctivae normal, sclerae anicteric  ENMT: External Ears, Nose appear normal. Grossly normal hearing. Mucous membranes are moist.  Neck: Appears normal, supple, no cervical masses, normal ROM, no appreciable thyromegaly; no appreciable JVD but has a right TDC in his in his chest Respiratory: Diminished to auscultation bilaterally with coarse breath sounds and is now back on supplemental oxygen via nasal cannula with some crackles worse in the left compared to right. Cardiovascular: RRR, no murmurs / rubs / gallops. S1 and S2 auscultated.  Mild 1+ lower extremity edema  Abdomen: Soft, non-tender, slightly distended second body habitus. Bowel sounds positive.  GU: Deferred. Musculoskeletal: No clubbing / cyanosis of digits/nails. No joint deformity upper and lower extremities.  Skin: No rashes, lesions, ulcers on limited skin evaluation. No induration; Warm and dry.  Neurologic: CN 2-12 grossly intact with no focal deficits. Romberg sign and cerebellar reflexes not assessed.  Psychiatric: Normal judgment and insight. Alert and oriented x 3. Normal mood and appropriate affect.   Data Reviewed: I have personally reviewed following labs and imaging  studies  CBC: Recent Labs  Lab 04/20/21 0543 04/21/21 0617 04/22/21 0544 04/23/21 0647 04/25/21 1300  WBC 7.9 10.7* 9.4 11.4* 8.3  NEUTROABS 5.0 8.2* 6.5 8.6* 5.2  HGB 7.4* 7.4* 8.0* 7.6* 7.3*  HCT 22.2* 22.7* 25.0* 23.6* 22.9*  MCV 94.1 95.8 94.0 95.9 96.2  PLT 265 273 306 285 063    Basic Metabolic Panel: Recent Labs  Lab 04/20/21 0543 04/21/21 0617 04/22/21 0544 04/23/21 0647 04/25/21 1300  NA 132* 130* 134* 131* 130*  K 4.3 4.8 4.4 4.6 4.1  CL 93* 87* 90* 89* 88*  CO2 27 28 29 28 29   GLUCOSE 84 115* 98 117* 134*  BUN 38* 51* 28* 46* 39*  CREATININE 9.22* 12.40* 8.07* 11.64* 10.32*  CALCIUM 8.9 8.7* 9.3 8.9 8.9  MG 1.7 1.7 1.7 1.8 2.0  PHOS 6.1* 6.7* 6.0* 7.0* 6.0*    GFR: Estimated Creatinine Clearance: 10.2 mL/min (A) (by C-G formula based on SCr of 10.32 mg/dL (H)). Liver Function Tests: Recent Labs  Lab 04/20/21 0543 04/21/21 0617 04/22/21 0544 04/23/21 0647 04/25/21 1300  AST 10* 13* 11* 13* 13*  ALT 7 6 6 7 7   ALKPHOS 44 51 55 53 58  BILITOT 0.2* 0.4 0.4 0.3 0.2*  PROT 7.9 8.0 8.3* 8.2* 8.4*  ALBUMIN 3.2* 3.2* 3.4* 3.4* 3.2*    No results for input(s): LIPASE, AMYLASE in the last 168 hours. No results for input(s): AMMONIA in the last 168 hours. Coagulation Profile: No results for input(s): INR, PROTIME in the last 168 hours. Cardiac Enzymes: No results for input(s): CKTOTAL, CKMB, CKMBINDEX, TROPONINI in the last 168 hours. BNP (last 3 results) No results for input(s): PROBNP in the last 8760 hours. HbA1C: No results for input(s): HGBA1C in the last 72 hours. CBG: No results for input(s): GLUCAP in the last 168 hours. Lipid Profile: No results for input(s): CHOL, HDL, LDLCALC, TRIG, CHOLHDL, LDLDIRECT in the last 72 hours. Thyroid Function Tests: No results for input(s): TSH, T4TOTAL, FREET4, T3FREE, THYROIDAB in the last 72 hours. Anemia Panel: No results for input(s): VITAMINB12, FOLATE, FERRITIN, TIBC, IRON, RETICCTPCT in the last  72 hours. Sepsis Labs: No results  for input(s): PROCALCITON, LATICACIDVEN in the last 168 hours.  Recent Results (from the past 240 hour(s))  Resp Panel by RT-PCR (Flu A&B, Covid) Nasopharyngeal Swab     Status: None   Collection Time: 04/17/21  1:00 PM   Specimen: Nasopharyngeal Swab; Nasopharyngeal(NP) swabs in vial transport medium  Result Value Ref Range Status   SARS Coronavirus 2 by RT PCR NEGATIVE NEGATIVE Final    Comment: (NOTE) SARS-CoV-2 target nucleic acids are NOT DETECTED.  The SARS-CoV-2 RNA is generally detectable in upper respiratory specimens during the acute phase of infection. The lowest concentration of SARS-CoV-2 viral copies this assay can detect is 138 copies/mL. A negative result does not preclude SARS-Cov-2 infection and should not be used as the sole basis for treatment or other patient management decisions. A negative result may occur with  improper specimen collection/handling, submission of specimen other than nasopharyngeal swab, presence of viral mutation(s) within the areas targeted by this assay, and inadequate number of viral copies(<138 copies/mL). A negative result must be combined with clinical observations, patient history, and epidemiological information. The expected result is Negative.  Fact Sheet for Patients:  EntrepreneurPulse.com.au  Fact Sheet for Healthcare Providers:  IncredibleEmployment.be  This test is no t yet approved or cleared by the [place] FDA and  has been authorized for detection and/or diagnosis of SARS-CoV-2 by FDA under an Emergency Use Authorization (EUA). This EUA will remain  in effect (meaning this test can be used) for the duration of the COVID-19 declaration under Section 564(b)(1) of the Act, 21 U.S.C.section 360bbb-3(b)(1), unless the authorization is terminated  or revoked sooner.       Influenza A by PCR NEGATIVE NEGATIVE Final   Influenza B by PCR NEGATIVE  NEGATIVE Final    Comment: (NOTE) The Xpert Xpress SARS-CoV-2/FLU/RSV plus assay is intended as an aid in the diagnosis of influenza from Nasopharyngeal swab specimens and should not be used as a sole basis for treatment. Nasal washings and aspirates are unacceptable for Xpert Xpress SARS-CoV-2/FLU/RSV testing.  Fact Sheet for Patients: EntrepreneurPulse.com.au  Fact Sheet for Healthcare Providers: IncredibleEmployment.be  This test is not yet approved or cleared by the [place] FDA and has been authorized for detection and/or diagnosis of SARS-CoV-2 by FDA under an Emergency Use Authorization (EUA). This EUA will remain in effect (meaning this test can be used) for the duration of the COVID-19 declaration under Section 564(b)(1) of the Act, 21 U.S.C. section 360bbb-3(b)(1), unless the authorization is terminated or revoked.  Performed at [facility], [address] [place], [place] [address]   MRSA Next Gen by PCR, Nasal     Status: None   Collection Time: 04/21/21  8:50 AM   Specimen: Nasal Mucosa; Nasal Swab  Result Value Ref Range Status   MRSA by PCR Next Gen NOT DETECTED NOT DETECTED Final    Comment: (NOTE) The GeneXpert MRSA Assay (FDA approved for NASAL specimens only), is one component of a comprehensive MRSA colonization surveillance program. It is not intended to diagnose MRSA infection nor to guide or monitor treatment for MRSA infections. Test performance is not FDA approved in patients less than 79 years old. Performed at [facility], [address] [place], [place] [address]     RN Pressure Injury Documentation:     Estimated body mass index is 26.18 kg/m as calculated from the following:   Height as of this encounter: 5\' 9"  (1.753 m).   Weight as of this encounter: 80.4 kg.  Malnutrition Type:   Malnutrition Characteristics:   Nutrition Interventions:   Radiology Studies: DG Chest  Port 1 View  Result Date: 04/25/2021 CLINICAL DATA:  Shortness of breath EXAM: PORTABLE CHEST 1 VIEW COMPARISON:  04/23/2021 FINDINGS: Dialysis catheter is again noted. Cardiac shadow is stable. Persistent bilateral effusions are noted. Left hydropneumothorax is again noted and stable. Patchy basilar opacities are noted left greater than right. IMPRESSION: Stable appearance of the chest when compared with the prior exam. Electronically Signed   By: Inez Catalina M.D.   On: 04/25/2021 02:40    Scheduled Meds:  aspirin  325 mg Oral Daily   calcium acetate  1,334 mg Oral TID WC   Chlorhexidine Gluconate Cloth  6 each Topical Q0600   epoetin (EPOGEN/PROCRIT) injection  10,000 Units Intravenous Q M,W,F-HD   guaiFENesin  1,200 mg Oral BID   heparin  5,000 Units Subcutaneous Q8H   ipratropium  0.5 mg Nebulization BID   levalbuterol  0.63 mg Nebulization BID   ondansetron (ZOFRAN) IV  4 mg Intravenous Once   pantoprazole  40 mg Oral Daily   sodium chloride flush  3 mL Intravenous Q12H   Continuous Infusions:  sodium chloride     sodium chloride     sodium chloride      LOS: 7 days   [place], DO [facility] PAGER is on AMION  If 7PM-7AM, please contact night-coverage [email or web address]

## 2021-04-25 NOTE — Progress Notes (Signed)
Patient completed a two hour UF treatment for a net UF of two liters.  Patient tolerated well with no HD complications.

## 2021-04-25 NOTE — Progress Notes (Signed)
[facility]  ROUNDING NOTE   Subjective:   Travis Long is a 36 year old African-American male with past medical concerns including hypertension and end-stage renal disease on dialysis.  Patient presents to the emergency department from his outpatient dialysis center with complaints of shortness of breath.  Patient will be admitted under observation for Acute pulmonary edema ([place]) [J81.0] Volume overload [E87.70]  Patient seen resting comfortably, currently denies shortness of breath, tolerating meals without nausea and vomiting.  Requesting additional dialysis treatment today for additional fluid removal not received yesterday.   Objective:  Vital signs in last 24 hours:  Temp:  [97.6 F (36.4 C)-99.6 F (37.6 C)] 99.3 F (37.4 C) (01/31 1145) Pulse Rate:  [91-103] 93 (01/31 1300) Resp:  [17-31] 19 (01/31 1300) BP: (69-183)/(33-91) 165/91 (01/31 1300) SpO2:  [91 %-100 %] 100 % (01/31 1300) Weight:  [80.2 kg-82.8 kg] 82.8 kg (01/31 1145)  Weight change:  Filed Weights   04/24/21 1000 04/24/21 1415 04/25/21 1145  Weight: 83.2 kg 80.2 kg 82.8 kg    Intake/Output: I/O last 3 completed shifts: In: -  Out: 1082 [Other:1082]   Intake/Output this shift:  Total I/O In: 240 [P.O.:240] Out: -   Physical Exam: General: NAD, resting in bed  Head: Normocephalic, atraumatic. Moist oral mucosal membranes  Eyes: Anicteric  Lungs:  Clear to auscultation, normal effort, room air  Heart: Regular rate and rhythm  Abdomen:  Soft, nontender  Extremities: No peripheral edema.  Neurologic: Nonfocal, moving all four extremities  Skin: No lesions  Access: Right IJ PermCath, Lt lower AVF    Basic Metabolic Panel: Recent Labs  Lab 04/19/21 0925 04/20/21 0543 04/21/21 0617 04/22/21 0544 04/23/21 0647  NA 133* 132* 130* 134* 131*  K 5.0 4.3 4.8 4.4 4.6  CL 93* 93* 87* 90* 89*  CO2 25 27 28 29 28   GLUCOSE 108* 84 115* 98 117*  BUN 56* 38* 51* 28* 46*  CREATININE  12.99* 9.22* 12.40* 8.07* 11.64*  CALCIUM 9.0 8.9 8.7* 9.3 8.9  MG 1.9 1.7 1.7 1.7 1.8  PHOS 7.5* 6.1* 6.7* 6.0* 7.0*     Liver Function Tests: Recent Labs  Lab 04/19/21 0925 04/20/21 0543 04/21/21 0617 04/22/21 0544 04/23/21 0647  AST 11* 10* 13* 11* 13*  ALT 7 7 6 6 7   ALKPHOS 55 44 51 55 53  BILITOT 0.5 0.2* 0.4 0.4 0.3  PROT 8.0 7.9 8.0 8.3* 8.2*  ALBUMIN 3.3* 3.2* 3.2* 3.4* 3.4*    No results for input(s): LIPASE, AMYLASE in the last 168 hours. No results for input(s): AMMONIA in the last 168 hours.  CBC: Recent Labs  Lab 04/19/21 0925 04/20/21 0543 04/21/21 0617 04/22/21 0544 04/23/21 0647  WBC 7.9 7.9 10.7* 9.4 11.4*  NEUTROABS 5.4 5.0 8.2* 6.5 8.6*  HGB 7.6* 7.4* 7.4* 8.0* 7.6*  HCT 22.9* 22.2* 22.7* 25.0* 23.6*  MCV 94.6 94.1 95.8 94.0 95.9  PLT 247 265 273 306 285     Cardiac Enzymes: No results for input(s): CKTOTAL, CKMB, CKMBINDEX, TROPONINI in the last 168 hours.  BNP: Invalid input(s): POCBNP  CBG: No results for input(s): GLUCAP in the last 168 hours.  Microbiology: Results for orders placed or performed during the hospital encounter of 04/17/21  Resp Panel by RT-PCR (Flu A&B, Covid) Nasopharyngeal Swab     Status: None   Collection Time: 04/17/21  1:00 PM   Specimen: Nasopharyngeal Swab; Nasopharyngeal(NP) swabs in vial transport medium  Result Value Ref Range Status   SARS  Coronavirus 2 by RT PCR NEGATIVE NEGATIVE Final    Comment: (NOTE) SARS-CoV-2 target nucleic acids are NOT DETECTED.  The SARS-CoV-2 RNA is generally detectable in upper respiratory specimens during the acute phase of infection. The lowest concentration of SARS-CoV-2 viral copies this assay can detect is 138 copies/mL. A negative result does not preclude SARS-Cov-2 infection and should not be used as the sole basis for treatment or other patient management decisions. A negative result may occur with  improper specimen collection/handling, submission of specimen  other than nasopharyngeal swab, presence of viral mutation(s) within the areas targeted by this assay, and inadequate number of viral copies(<138 copies/mL). A negative result must be combined with clinical observations, patient history, and epidemiological information. The expected result is Negative.  Fact Sheet for Patients:  EntrepreneurPulse.com.au  Fact Sheet for Healthcare Providers:  IncredibleEmployment.be  This test is no t yet approved or cleared by the [place] FDA and  has been authorized for detection and/or diagnosis of SARS-CoV-2 by FDA under an Emergency Use Authorization (EUA). This EUA will remain  in effect (meaning this test can be used) for the duration of the COVID-19 declaration under Section 564(b)(1) of the Act, 21 U.S.C.section 360bbb-3(b)(1), unless the authorization is terminated  or revoked sooner.       Influenza A by PCR NEGATIVE NEGATIVE Final   Influenza B by PCR NEGATIVE NEGATIVE Final    Comment: (NOTE) The Xpert Xpress SARS-CoV-2/FLU/RSV plus assay is intended as an aid in the diagnosis of influenza from Nasopharyngeal swab specimens and should not be used as a sole basis for treatment. Nasal washings and aspirates are unacceptable for Xpert Xpress SARS-CoV-2/FLU/RSV testing.  Fact Sheet for Patients: EntrepreneurPulse.com.au  Fact Sheet for Healthcare Providers: IncredibleEmployment.be  This test is not yet approved or cleared by the [place] FDA and has been authorized for detection and/or diagnosis of SARS-CoV-2 by FDA under an Emergency Use Authorization (EUA). This EUA will remain in effect (meaning this test can be used) for the duration of the COVID-19 declaration under Section 564(b)(1) of the Act, 21 U.S.C. section 360bbb-3(b)(1), unless the authorization is terminated or revoked.  Performed at [facility], [address]  [place], [place] [address]   MRSA Next Gen by PCR, Nasal     Status: None   Collection Time: 04/21/21  8:50 AM   Specimen: Nasal Mucosa; Nasal Swab  Result Value Ref Range Status   MRSA by PCR Next Gen NOT DETECTED NOT DETECTED Final    Comment: (NOTE) The GeneXpert MRSA Assay (FDA approved for NASAL specimens only), is one component of a comprehensive MRSA colonization surveillance program. It is not intended to diagnose MRSA infection nor to guide or monitor treatment for MRSA infections. Test performance is not FDA approved in patients less than 54 years old. Performed at [facility], [address] [place], [place] [address]     Coagulation Studies: No results for input(s): LABPROT, INR in the last 72 hours.  Urinalysis: No results for input(s): COLORURINE, LABSPEC, PHURINE, GLUCOSEU, HGBUR, BILIRUBINUR, KETONESUR, PROTEINUR, UROBILINOGEN, NITRITE, LEUKOCYTESUR in the last 72 hours.  Invalid input(s): APPERANCEUR    Imaging: DG Chest Port 1 View  Result Date: 04/25/2021 CLINICAL DATA:  Shortness of breath EXAM: PORTABLE CHEST 1 VIEW COMPARISON:  04/23/2021 FINDINGS: Dialysis catheter is again noted. Cardiac shadow is stable. Persistent bilateral effusions are noted. Left hydropneumothorax is again noted and stable. Patchy basilar opacities are noted left greater than right. IMPRESSION: Stable appearance of  the chest when compared with the prior exam. Electronically Signed   By: Inez Catalina M.D.   On: 04/25/2021 02:40     Medications:    sodium chloride     sodium chloride     sodium chloride      aspirin  325 mg Oral Daily   calcium acetate  1,334 mg Oral TID WC   Chlorhexidine Gluconate Cloth  6 each Topical Q0600   epoetin (EPOGEN/PROCRIT) injection  10,000 Units Intravenous Q M,W,F-HD   guaiFENesin  1,200 mg Oral BID   heparin  5,000 Units Subcutaneous Q8H   ipratropium  0.5 mg Nebulization BID   levalbuterol  0.63 mg Nebulization BID   ondansetron  (ZOFRAN) IV  4 mg Intravenous Once   pantoprazole  40 mg Oral Daily   sodium chloride flush  3 mL Intravenous Q12H     Assessment/ Plan:  Travis Long is a 36 y.o.  male past medical concerns including hypertension and end-stage renal disease on dialysis.  Patient presents to the emergency department from his outpatient dialysis center with complaints of shortness of breath.  Patient will be admitted under observation for Acute pulmonary edema (HCC) [J81.0] Volume overload [E87.70]  [facility]/MWF/right IJ PermCath  ESRD.  Patient received dialysis yesterday, UF reduced to 1 L due to hypotension.  Plan to provide sequential only treatment today to attempt further for removal.  Discussed with patient that we have removed a great deal of fluid during this admission.  Per pulmonary recommendations, we have increased UF goals but may have reached his available fluid limit in circulation. I have discussed this with patient and that the fluid in his lungs has to return to circulation for dialysis remove it and that process does not happen overnight. Will plan to dialyze tomorrow to maintain outpatient schedule.   2. Anemia of chronic kidney disease  Lab Results  Component Value Date   HGB 7.6 (L) 04/23/2021  Continue Epogen 10,000 IV with dialysis.  Maintain the patient on calcium acetate 2 tablets p.o. 3 times daily with meals.   3. Secondary Hyperparathyroidism:   Lab Results  Component Value Date   CALCIUM 8.9 04/23/2021   PHOS 7.0 (H) 04/23/2021   Calcium within acceptable range Continue calcium acetate, 2 capsules with meals.  4.  Hypertension with chronic kidney disease.  Continue amlodipine, carvedilol, hydralazine, minoxidil. Receiving all meds during admission.   LOS: [place] 1/31/20231:12 PM

## 2021-04-26 ENCOUNTER — Inpatient Hospital Stay: Payer: Medicare Other

## 2021-04-26 ENCOUNTER — Encounter: Payer: Self-pay | Admitting: Internal Medicine

## 2021-04-26 LAB — COMPREHENSIVE METABOLIC PANEL
ALT: 12 U/L (ref 0–44)
AST: 17 U/L (ref 15–41)
Albumin: 3.3 g/dL — ABNORMAL LOW (ref 3.5–5.0)
Alkaline Phosphatase: 62 U/L (ref 38–126)
Anion gap: 15 (ref 5–15)
BUN: 57 mg/dL — ABNORMAL HIGH (ref 6–20)
CO2: 32 mmol/L (ref 22–32)
Calcium: 8.7 mg/dL — ABNORMAL LOW (ref 8.9–10.3)
Chloride: 86 mmol/L — ABNORMAL LOW (ref 98–111)
Creatinine, Ser: 13.86 mg/dL — ABNORMAL HIGH (ref 0.61–1.24)
GFR, Estimated: 4 mL/min — ABNORMAL LOW (ref >60)
Glucose, Bld: 99 mg/dL (ref 70–99)
Potassium: 4.7 mmol/L (ref 3.5–5.1)
Sodium: 133 mmol/L — ABNORMAL LOW (ref 135–145)
Total Bilirubin: 0.2 mg/dL — ABNORMAL LOW (ref 0.3–1.2)
Total Protein: 8.4 g/dL — ABNORMAL HIGH (ref 6.5–8.1)

## 2021-04-26 LAB — CBC WITH DIFFERENTIAL/PLATELET
Abs Immature Granulocytes: 0.04 10*3/uL (ref 0.00–0.07)
Basophils Absolute: 0 10*3/uL (ref 0.0–0.1)
Basophils Relative: 0 %
Eosinophils Absolute: 1 10*3/uL — ABNORMAL HIGH (ref 0.0–0.5)
Eosinophils Relative: 9 %
HCT: 21.2 % — ABNORMAL LOW (ref 39.0–52.0)
Hemoglobin: 7.1 g/dL — ABNORMAL LOW (ref 13.0–17.0)
Immature Granulocytes: 0 %
Lymphocytes Relative: 9 %
Lymphs Abs: 0.9 10*3/uL (ref 0.7–4.0)
MCH: 31.6 pg (ref 26.0–34.0)
MCHC: 33.5 g/dL (ref 30.0–36.0)
MCV: 94.2 fL (ref 80.0–100.0)
Monocytes Absolute: 1.5 10*3/uL — ABNORMAL HIGH (ref 0.1–1.0)
Monocytes Relative: 14 %
Neutro Abs: 7.2 10*3/uL (ref 1.7–7.7)
Neutrophils Relative %: 68 %
Platelets: 315 10*3/uL (ref 150–400)
RBC: 2.25 MIL/uL — ABNORMAL LOW (ref 4.22–5.81)
RDW: 13.8 % (ref 11.5–15.5)
WBC: 10.7 10*3/uL — ABNORMAL HIGH (ref 4.0–10.5)
nRBC: 0 % (ref 0.0–0.2)

## 2021-04-26 LAB — MAGNESIUM: Magnesium: 2.1 mg/dL (ref 1.7–2.4)

## 2021-04-26 LAB — PHOSPHORUS: Phosphorus: 7.3 mg/dL — ABNORMAL HIGH (ref 2.5–4.6)

## 2021-04-26 MED ORDER — HEPARIN SODIUM (PORCINE) 1000 UNIT/ML IJ SOLN
INTRAMUSCULAR | Status: AC
  Filled 2021-04-26: qty 10

## 2021-04-26 MED ORDER — EPOETIN ALFA 10000 UNIT/ML IJ SOLN
INTRAMUSCULAR | Status: AC
  Filled 2021-04-26: qty 1

## 2021-04-26 MED ORDER — CALCIUM CARBONATE ANTACID 500 MG PO CHEW
1.0000 | CHEWABLE_TABLET | Freq: Once | ORAL | Status: AC
  Administered 2021-04-26: 04:00:00 200 mg via ORAL
  Filled 2021-04-26: qty 1

## 2021-04-26 NOTE — Progress Notes (Signed)
Patient completes a shorten treatment, declining a full treatment of 3.5 hours. CVC function well, maintains blood flow rate, no signs of infection. Fluid removed 2 liters. Labs drawn and meds given per order. Patient return to assigned room, report given to primary nurse.

## 2021-04-26 NOTE — Progress Notes (Addendum)
[facility]  ROUNDING NOTE   Subjective:   Travis Long is a 36 year old African-American male with past medical concerns including hypertension and end-stage renal disease on dialysis.  Patient presents to the emergency department from his outpatient dialysis center with complaints of shortness of breath.  Patient will be admitted under observation for Acute pulmonary edema ([place]) [J81.0] Volume overload [E87.70]  Patient resting quietly Alert and oriented Currently on room air, states he was placed on oxygen overnight, but he felt fine. Complains of mild headache and nasal soreness Tolerated dialysis well yesterday Patient seen later in am with 2L [place] Patient continues to voice interest in kidney transplant  Objective:  Vital signs in last 24 hours:  Temp:  [98.2 F (36.8 C)-99.3 F (37.4 C)] 98.9 F (37.2 C) (02/01 0855) Pulse Rate:  [87-101] 92 (02/01 0855) Resp:  [17-30] 17 (02/01 0855) BP: (116-183)/(64-91) 142/84 (02/01 0855) SpO2:  [84 %-100 %] 97 % (02/01 0855) Weight:  [80.4 kg-82.8 kg] 80.4 kg (01/31 1415)  Weight change: -0.4 kg Filed Weights   04/24/21 1415 04/25/21 1145 04/25/21 1415  Weight: 80.2 kg 82.8 kg 80.4 kg    Intake/Output: I/O last 3 completed shifts: In: 240 [P.O.:240] Out: 2000 [Other:2000]   Intake/Output this shift:  Total I/O In: 240 [P.O.:240] Out: -   Physical Exam: General: NAD, resting in bed  Head: Normocephalic, atraumatic. Moist oral mucosal membranes  Eyes: Anicteric  Lungs:  Clear to auscultation, normal effort, room air  Heart: Regular rate and rhythm  Abdomen:  Soft, nontender  Extremities: No peripheral edema.  Neurologic: Nonfocal, moving all four extremities  Skin: No lesions  Access: Right IJ PermCath, Lt lower AVF    Basic Metabolic Panel: Recent Labs  Lab 04/20/21 0543 04/21/21 0617 04/22/21 0544 04/23/21 0647 04/25/21 1300  NA 132* 130* 134* 131* 130*  K 4.3 4.8 4.4 4.6 4.1  CL 93* 87* 90*  89* 88*  CO2 27 28 29 28 29   GLUCOSE 84 115* 98 117* 134*  BUN 38* 51* 28* 46* 39*  CREATININE 9.22* 12.40* 8.07* 11.64* 10.32*  CALCIUM 8.9 8.7* 9.3 8.9 8.9  MG 1.7 1.7 1.7 1.8 2.0  PHOS 6.1* 6.7* 6.0* 7.0* 6.0*     Liver Function Tests: Recent Labs  Lab 04/20/21 0543 04/21/21 0617 04/22/21 0544 04/23/21 0647 04/25/21 1300  AST 10* 13* 11* 13* 13*  ALT 7 6 6 7 7   ALKPHOS 44 51 55 53 58  BILITOT 0.2* 0.4 0.4 0.3 0.2*  PROT 7.9 8.0 8.3* 8.2* 8.4*  ALBUMIN 3.2* 3.2* 3.4* 3.4* 3.2*    No results for input(s): LIPASE, AMYLASE in the last 168 hours. No results for input(s): AMMONIA in the last 168 hours.  CBC: Recent Labs  Lab 04/20/21 0543 04/21/21 0617 04/22/21 0544 04/23/21 0647 04/25/21 1300  WBC 7.9 10.7* 9.4 11.4* 8.3  NEUTROABS 5.0 8.2* 6.5 8.6* 5.2  HGB 7.4* 7.4* 8.0* 7.6* 7.3*  HCT 22.2* 22.7* 25.0* 23.6* 22.9*  MCV 94.1 95.8 94.0 95.9 96.2  PLT 265 273 306 285 314     Cardiac Enzymes: No results for input(s): CKTOTAL, CKMB, CKMBINDEX, TROPONINI in the last 168 hours.  BNP: Invalid input(s): POCBNP  CBG: No results for input(s): GLUCAP in the last 168 hours.  Microbiology: Results for orders placed or performed during the hospital encounter of 04/17/21  Resp Panel by RT-PCR (Flu A&B, Covid) Nasopharyngeal Swab     Status: None   Collection Time: 04/17/21  1:00 PM  Specimen: Nasopharyngeal Swab; Nasopharyngeal(NP) swabs in vial transport medium  Result Value Ref Range Status   SARS Coronavirus 2 by RT PCR NEGATIVE NEGATIVE Final    Comment: (NOTE) SARS-CoV-2 target nucleic acids are NOT DETECTED.  The SARS-CoV-2 RNA is generally detectable in upper respiratory specimens during the acute phase of infection. The lowest concentration of SARS-CoV-2 viral copies this assay can detect is 138 copies/mL. A negative result does not preclude SARS-Cov-2 infection and should not be used as the sole basis for treatment or other patient management  decisions. A negative result may occur with  improper specimen collection/handling, submission of specimen other than nasopharyngeal swab, presence of viral mutation(s) within the areas targeted by this assay, and inadequate number of viral copies(<138 copies/mL). A negative result must be combined with clinical observations, patient history, and epidemiological information. The expected result is Negative.  Fact Sheet for Patients:  EntrepreneurPulse.com.au  Fact Sheet for Healthcare Providers:  IncredibleEmployment.be  This test is no t yet approved or cleared by the [place] FDA and  has been authorized for detection and/or diagnosis of SARS-CoV-2 by FDA under an Emergency Use Authorization (EUA). This EUA will remain  in effect (meaning this test can be used) for the duration of the COVID-19 declaration under Section 564(b)(1) of the Act, 21 U.S.C.section 360bbb-3(b)(1), unless the authorization is terminated  or revoked sooner.       Influenza A by PCR NEGATIVE NEGATIVE Final   Influenza B by PCR NEGATIVE NEGATIVE Final    Comment: (NOTE) The Xpert Xpress SARS-CoV-2/FLU/RSV plus assay is intended as an aid in the diagnosis of influenza from Nasopharyngeal swab specimens and should not be used as a sole basis for treatment. Nasal washings and aspirates are unacceptable for Xpert Xpress SARS-CoV-2/FLU/RSV testing.  Fact Sheet for Patients: EntrepreneurPulse.com.au  Fact Sheet for Healthcare Providers: IncredibleEmployment.be  This test is not yet approved or cleared by the [place] FDA and has been authorized for detection and/or diagnosis of SARS-CoV-2 by FDA under an Emergency Use Authorization (EUA). This EUA will remain in effect (meaning this test can be used) for the duration of the COVID-19 declaration under Section 564(b)(1) of the Act, 21 U.S.C. section 360bbb-3(b)(1), unless the  authorization is terminated or revoked.  Performed at [facility], [address] [place], [place] [address]   MRSA Next Gen by PCR, Nasal     Status: None   Collection Time: 04/21/21  8:50 AM   Specimen: Nasal Mucosa; Nasal Swab  Result Value Ref Range Status   MRSA by PCR Next Gen NOT DETECTED NOT DETECTED Final    Comment: (NOTE) The GeneXpert MRSA Assay (FDA approved for NASAL specimens only), is one component of a comprehensive MRSA colonization surveillance program. It is not intended to diagnose MRSA infection nor to guide or monitor treatment for MRSA infections. Test performance is not FDA approved in patients less than 54 years old. Performed at [facility], [address] [place], [place] [address]     Coagulation Studies: No results for input(s): LABPROT, INR in the last 72 hours.  Urinalysis: No results for input(s): COLORURINE, LABSPEC, PHURINE, GLUCOSEU, HGBUR, BILIRUBINUR, KETONESUR, PROTEINUR, UROBILINOGEN, NITRITE, LEUKOCYTESUR in the last 72 hours.  Invalid input(s): APPERANCEUR    Imaging: DG Chest 2 View  Result Date: 04/26/2021 CLINICAL DATA:  Volume overload; shortness of breath; htn EXAM: CHEST - 2 VIEW COMPARISON:  the previous day's study FINDINGS: Large left and moderate right pleural effusions as before. Extensive consolidation/atelectasis  throughout much of the left lung, and the right lung base. Stable tunneled right IJ hemodialysis catheter to the cavoatrial junction. Heart size difficult to assess due to adjacent opacities. No pneumothorax. Visualized bones unremarkable. IMPRESSION: Stable bilateral effusions and parenchymal consolidation/atelectasis, left worse than right. Electronically Signed   By: Lucrezia Europe M.D.   On: 04/26/2021 08:13   DG Chest Port 1 View  Result Date: 04/25/2021 CLINICAL DATA:  Shortness of breath EXAM: PORTABLE CHEST 1 VIEW COMPARISON:  04/23/2021 FINDINGS: Dialysis catheter is again noted. Cardiac  shadow is stable. Persistent bilateral effusions are noted. Left hydropneumothorax is again noted and stable. Patchy basilar opacities are noted left greater than right. IMPRESSION: Stable appearance of the chest when compared with the prior exam. Electronically Signed   By: Inez Catalina M.D.   On: 04/25/2021 02:40     Medications:    sodium chloride     sodium chloride     sodium chloride      aspirin  325 mg Oral Daily   calcium acetate  1,334 mg Oral TID WC   Chlorhexidine Gluconate Cloth  6 each Topical Q0600   epoetin (EPOGEN/PROCRIT) injection  10,000 Units Intravenous Q M,W,F-HD   guaiFENesin  1,200 mg Oral BID   heparin  5,000 Units Subcutaneous Q8H   ipratropium  0.5 mg Nebulization BID   levalbuterol  0.63 mg Nebulization BID   ondansetron (ZOFRAN) IV  4 mg Intravenous Once   pantoprazole  40 mg Oral Daily   sodium chloride flush  3 mL Intravenous Q12H     Assessment/ Plan:  [name] is a 36 y.o.  male past medical concerns including hypertension and end-stage renal disease on dialysis.  Patient presents to the emergency department from his outpatient dialysis center with complaints of shortness of breath.  Patient will be admitted under observation for Acute pulmonary edema (HCC) [J81.0] Volume overload [E87.70]  [facility]/MWF/right IJ PermCath  ESRD.  Patient received additional dialysis treatment yesterday, UF goal 2L achieved. Plan to dialyze later today with UF goal 2L as tolerated. Discussed with patient that he must be in a better state of health before being considered for kidney transplant.   2. Anemia of chronic kidney disease  Lab Results  Component Value Date   HGB 7.3 (L) 04/25/2021  Hgb not at goal, will continue EPO with treatments   3. Secondary Hyperparathyroidism:   Lab Results  Component Value Date   CALCIUM 8.9 04/25/2021   PHOS 6.0 (H) 04/25/2021  Phosphorus remains elevated, continue calcium acetate with meals  4.   Hypertension with chronic kidney disease.  Continue amlodipine, carvedilol, hydralazine, minoxidil. Receiving all meds during admission. BP 154/77   LOS: 8   2/1/202310:21 AM

## 2021-04-26 NOTE — Progress Notes (Addendum)
Progress Note    Travis Long  ZDG:[id number] DOB: 01-25-1986  DOA: 04/17/2021 PCP: Pcp, No      Brief Narrative:    Medical records reviewed and are as summarized below:  Travis Long is a 36 y.o. male with medical history significant for ESRD on hemodialysis on M, W and F, chronic diastolic CHF, anemia of chronic disease, hypertension, depression, recent hospitalization for symptomatic pleural effusion requiring left thoracentesis and left chest tube placement on 03/08/2021.  He presented to the hospital because of shortness of breath secondary to incomplete hemodialysis from dialysis catheter malfunction.  He was admitted to the hospital for volume overload secondary to inadequate hemodialysis.    Assessment/Plan:   Principal Problem:   Volume overload Active Problems:   ESRD on hemodialysis (HCC)   Hypertension   Anemia in ESRD (end-stage renal disease) (HCC)   Pleural effusion   Hypertensive urgency    Body mass index is 26.18 kg/m.  Acute on chronic hypoxic respiratory failure: He is requiring 2 L/min oxygen and he will be discharged home on oxygen.  ESRD on hemodialysis with volume overload, hyperphosphatemia, secondary hyperparathyroidism, dialysis catheter malfunction: Follow-up with nephrologist for hemodialysis.  S/p right IJ tunneled dialysis catheter placement on 04/18/2021.  Moderate complex left hydropneumothorax, bilateral pleural effusions: Dr. Cyndia Bent, [place] surgeon at [facility] recommended conservative management because he is not a candidate for surgery.  Case discussed with Dr. Lanney Gins, pulmonologist.  He recommended outpatient follow-up at a tertiary center.  Patient will follow-up with CT surgeon as an outpatient at [facility] or [facility].  Hypertension, recent hypotension: Monitor blood pressure off antihypertensives  Nausea and vomiting: Antiemetics as needed  Chronic hyponatremia: Likely from kidney disease  Anemia of  chronic disease: Repeat CBC tomorrow  Diet Order             Diet renal with fluid restriction Fluid restriction: 1200 mL Fluid; Room service appropriate? Yes; Fluid consistency: Thin  Diet effective now                      Consultants: Nephrologist Pulmonologist  Procedures: Insertion of right IJ tunneled dialysis catheter 04/18/2021    Medications:    aspirin  325 mg Oral Daily   calcium acetate  1,334 mg Oral TID WC   Chlorhexidine Gluconate Cloth  6 each Topical Q0600   epoetin (EPOGEN/PROCRIT) injection  10,000 Units Intravenous Q M,W,F-HD   guaiFENesin  1,200 mg Oral BID   heparin  5,000 Units Subcutaneous Q8H   ipratropium  0.5 mg Nebulization BID   levalbuterol  0.63 mg Nebulization BID   ondansetron (ZOFRAN) IV  4 mg Intravenous Once   pantoprazole  40 mg Oral Daily   sodium chloride flush  3 mL Intravenous Q12H   Continuous Infusions:  sodium chloride     sodium chloride     sodium chloride       Anti-infectives (From admission, onward)    Start     Dose/Rate Route Frequency Ordered Stop   04/19/21 0600  ceFAZolin (ANCEF) IVPB 2g/100 mL premix        2 g 200 mL/hr over 30 Minutes Intravenous On call to O.R. 04/18/21 1203 04/20/21 0559   04/18/21 1441  ceFAZolin (ANCEF) 1-4 GM/50ML-% IVPB       Note to Pharmacy: Corlis Hove H: cabinet override      04/18/21 1441 04/19/21 0244  Family Communication/Anticipated D/C date and plan/Code Status   DVT prophylaxis: Place and maintain sequential compression device Start: 04/21/21 1656 heparin injection 5,000 Units Start: 04/17/21 1600     Code Status: Full Code  Family Communication: None Disposition Plan: Plan to discharge home in 1 to 2 days   Status is: Inpatient Remains inpatient appropriate because: Hypoxia               Subjective:   Interval events noted.  He complains of shortness of breath.  Objective:    Vitals:   04/26/21 0017 04/26/21  0524 04/26/21 0855 04/26/21 1122  BP:  (!) 142/86 (!) 142/84 (!) 154/77  Pulse: 92 94 92 96  Resp:  18 17 16   Temp:  98.9 F (37.2 C) 98.9 F (37.2 C) 98.7 F (37.1 C)  TempSrc:  Oral Oral   SpO2: 99% 100% 97% 90%  Weight:      Height:       No data found.   Intake/Output Summary (Last 24 hours) at 04/26/2021 1543 Last data filed at 04/26/2021 1418 Gross per 24 hour  Intake 720 ml  Output --  Net 720 ml   Filed Weights   04/24/21 1415 04/25/21 1145 04/25/21 1415  Weight: 80.2 kg 82.8 kg 80.4 kg    Exam:  GEN: NAD SKIN: No rash EYES: EOMI ENT: MMM CV: RRR PULM: CTA B ABD: soft, ND, NT, +BS CNS: AAO x 3, non focal EXT: No edema or tenderness        Data Reviewed:   I have personally reviewed following labs and imaging studies:  Labs: Labs show the following:   Basic Metabolic Panel: Recent Labs  Lab 04/20/21 0543 04/21/21 0617 04/22/21 0544 04/23/21 0647 04/25/21 1300  NA 132* 130* 134* 131* 130*  K 4.3 4.8 4.4 4.6 4.1  CL 93* 87* 90* 89* 88*  CO2 27 28 29 28 29   GLUCOSE 84 115* 98 117* 134*  BUN 38* 51* 28* 46* 39*  CREATININE 9.22* 12.40* 8.07* 11.64* 10.32*  CALCIUM 8.9 8.7* 9.3 8.9 8.9  MG 1.7 1.7 1.7 1.8 2.0  PHOS 6.1* 6.7* 6.0* 7.0* 6.0*   GFR Estimated Creatinine Clearance: 10.2 mL/min (A) (by C-G formula based on SCr of 10.32 mg/dL (H)). Liver Function Tests: Recent Labs  Lab 04/20/21 0543 04/21/21 0617 04/22/21 0544 04/23/21 0647 04/25/21 1300  AST 10* 13* 11* 13* 13*  ALT 7 6 6 7 7   ALKPHOS 44 51 55 53 58  BILITOT 0.2* 0.4 0.4 0.3 0.2*  PROT 7.9 8.0 8.3* 8.2* 8.4*  ALBUMIN 3.2* 3.2* 3.4* 3.4* 3.2*   No results for input(s): LIPASE, AMYLASE in the last 168 hours. No results for input(s): AMMONIA in the last 168 hours. Coagulation profile No results for input(s): INR, PROTIME in the last 168 hours.  CBC: Recent Labs  Lab 04/20/21 0543 04/21/21 0617 04/22/21 0544 04/23/21 0647 04/25/21 1300  WBC 7.9 10.7* 9.4 11.4*  8.3  NEUTROABS 5.0 8.2* 6.5 8.6* 5.2  HGB 7.4* 7.4* 8.0* 7.6* 7.3*  HCT 22.2* 22.7* 25.0* 23.6* 22.9*  MCV 94.1 95.8 94.0 95.9 96.2  PLT 265 273 306 285 314   Cardiac Enzymes: No results for input(s): CKTOTAL, CKMB, CKMBINDEX, TROPONINI in the last 168 hours. BNP (last 3 results) No results for input(s): PROBNP in the last 8760 hours. CBG: No results for input(s): GLUCAP in the last 168 hours. D-Dimer: No results for input(s): DDIMER in the last 72 hours. Hgb A1c: No results  for input(s): HGBA1C in the last 72 hours. Lipid Profile: No results for input(s): CHOL, HDL, LDLCALC, TRIG, CHOLHDL, LDLDIRECT in the last 72 hours. Thyroid function studies: No results for input(s): TSH, T4TOTAL, T3FREE, THYROIDAB in the last 72 hours.  Invalid input(s): FREET3 Anemia work up: No results for input(s): VITAMINB12, FOLATE, FERRITIN, TIBC, IRON, RETICCTPCT in the last 72 hours. Sepsis Labs: Recent Labs  Lab 04/21/21 0617 04/22/21 0544 04/23/21 0647 04/25/21 1300  WBC 10.7* 9.4 11.4* 8.3    Microbiology Recent Results (from the past 240 hour(s))  Resp Panel by RT-PCR (Flu A&B, Covid) Nasopharyngeal Swab     Status: None   Collection Time: 04/17/21  1:00 PM   Specimen: Nasopharyngeal Swab; Nasopharyngeal(NP) swabs in vial transport medium  Result Value Ref Range Status   SARS Coronavirus 2 by RT PCR NEGATIVE NEGATIVE Final    Comment: (NOTE) SARS-CoV-2 target nucleic acids are NOT DETECTED.  The SARS-CoV-2 RNA is generally detectable in upper respiratory specimens during the acute phase of infection. The lowest concentration of SARS-CoV-2 viral copies this assay can detect is 138 copies/mL. A negative result does not preclude SARS-Cov-2 infection and should not be used as the sole basis for treatment or other patient management decisions. A negative result may occur with  improper specimen collection/handling, submission of specimen other than nasopharyngeal swab, presence of  viral mutation(s) within the areas targeted by this assay, and inadequate number of viral copies(<138 copies/mL). A negative result must be combined with clinical observations, patient history, and epidemiological information. The expected result is Negative.  Fact Sheet for Patients:  EntrepreneurPulse.com.au  Fact Sheet for Healthcare Providers:  IncredibleEmployment.be  This test is no t yet approved or cleared by the [place] FDA and  has been authorized for detection and/or diagnosis of SARS-CoV-2 by FDA under an Emergency Use Authorization (EUA). This EUA will remain  in effect (meaning this test can be used) for the duration of the COVID-19 declaration under Section 564(b)(1) of the Act, 21 U.S.C.section 360bbb-3(b)(1), unless the authorization is terminated  or revoked sooner.       Influenza A by PCR NEGATIVE NEGATIVE Final   Influenza B by PCR NEGATIVE NEGATIVE Final    Comment: (NOTE) The Xpert Xpress SARS-CoV-2/FLU/RSV plus assay is intended as an aid in the diagnosis of influenza from Nasopharyngeal swab specimens and should not be used as a sole basis for treatment. Nasal washings and aspirates are unacceptable for Xpert Xpress SARS-CoV-2/FLU/RSV testing.  Fact Sheet for Patients: EntrepreneurPulse.com.au  Fact Sheet for Healthcare Providers: IncredibleEmployment.be  This test is not yet approved or cleared by the [place] FDA and has been authorized for detection and/or diagnosis of SARS-CoV-2 by FDA under an Emergency Use Authorization (EUA). This EUA will remain in effect (meaning this test can be used) for the duration of the COVID-19 declaration under Section 564(b)(1) of the Act, 21 U.S.C. section 360bbb-3(b)(1), unless the authorization is terminated or revoked.  Performed at [facility], [address] [place],  [address]   MRSA Next Gen by PCR,  Nasal     Status: None   Collection Time: 04/21/21  8:50 AM   Specimen: Nasal Mucosa; Nasal Swab  Result Value Ref Range Status   MRSA by PCR Next Gen NOT DETECTED NOT DETECTED Final    Comment: (NOTE) The GeneXpert MRSA Assay (FDA approved for NASAL specimens only), is one component of a comprehensive MRSA colonization surveillance program. It is not intended to diagnose MRSA infection nor  to guide or monitor treatment for MRSA infections. Test performance is not FDA approved in patients less than 61 years old. Performed at [facility], [address] [place], [place] [address]     Procedures and diagnostic studies:  DG Chest 2 View  Result Date: 04/26/2021 CLINICAL DATA:  Volume overload; shortness of breath; htn EXAM: CHEST - 2 VIEW COMPARISON:  the previous day's study FINDINGS: Large left and moderate right pleural effusions as before. Extensive consolidation/atelectasis throughout much of the left lung, and the right lung base. Stable tunneled right IJ hemodialysis catheter to the cavoatrial junction. Heart size difficult to assess due to adjacent opacities. No pneumothorax. Visualized bones unremarkable. IMPRESSION: Stable bilateral effusions and parenchymal consolidation/atelectasis, left worse than right. Electronically Signed   By: Lucrezia Europe M.D.   On: 04/26/2021 08:13   DG Chest Port 1 View  Result Date: 04/25/2021 CLINICAL DATA:  Shortness of breath EXAM: PORTABLE CHEST 1 VIEW COMPARISON:  04/23/2021 FINDINGS: Dialysis catheter is again noted. Cardiac shadow is stable. Persistent bilateral effusions are noted. Left hydropneumothorax is again noted and stable. Patchy basilar opacities are noted left greater than right. IMPRESSION: Stable appearance of the chest when compared with the prior exam. Electronically Signed   By: Inez Catalina M.D.   On: 04/25/2021 02:40               LOS: 8 days   Travis Long  [facility]   Pager on [email or web address]  7PM-7AM, please contact night-coverage at [email or web address]     04/26/2021, 3:43 PM

## 2021-04-27 MED ORDER — HYDRALAZINE HCL 50 MG PO TABS: 100.0000 mg | ORAL_TABLET | Freq: Three times a day (TID) | ORAL | Status: DC

## 2021-04-27 MED ORDER — AMLODIPINE BESYLATE 10 MG PO TABS: 10.0000 mg | ORAL_TABLET | Freq: Every day | ORAL | Status: DC

## 2021-04-27 MED ORDER — CARVEDILOL 25 MG PO TABS: 25.0000 mg | ORAL_TABLET | Freq: Two times a day (BID) | ORAL | Status: DC

## 2021-04-27 NOTE — Care Management Important Message (Signed)
Important Message  Patient Details  Name: Travis Long MRN: [id number] Date of Birth: 1985/10/20   Medicare Important Message Given:  Yes     [name] 04/27/2021, 10:06 AM

## 2021-04-27 NOTE — Progress Notes (Signed)
[facility]  ROUNDING NOTE   Subjective:   Travis Long is a 36 year old African-American male with past medical concerns including hypertension and end-stage renal disease on dialysis.  Patient presents to the emergency department from his outpatient dialysis center with complaints of shortness of breath.  Patient will be admitted under observation for Acute pulmonary edema ([place]) [J81.0] Volume overload [E87.70]  Patient seen resting in bed comfortably Alert and oriented Terminated dialysis after 2 hours with 2 L removed yesterday, feels his breathing has improved with fluid removal No lower extremity edema  Objective:  Vital signs in last 24 hours:  Temp:  [97.9 F (36.6 C)-98.7 F (37.1 C)] 97.9 F (36.6 C) (02/02 0759) Pulse Rate:  [93-99] 99 (02/02 0759) Resp:  [16-35] 18 (02/02 0759) BP: (123-171)/(77-103) 171/103 (02/02 0759) SpO2:  [90 %-100 %] 90 % (02/02 0759) Weight:  [80.3 kg-82.1 kg] 80.3 kg (02/01 1807)  Weight change: -0.7 kg Filed Weights   04/25/21 1415 04/26/21 1542 04/26/21 1807  Weight: 80.4 kg 82.1 kg 80.3 kg    Intake/Output: I/O last 3 completed shifts: In: 720 [P.O.:720] Out: 2004 [Other:2004]   Intake/Output this shift:  No intake/output data recorded.  Physical Exam: General: NAD, resting in bed  Head: Normocephalic, atraumatic. Moist oral mucosal membranes  Eyes: Anicteric  Lungs:  Clear to auscultation, normal effort, room air  Heart: Regular rate and rhythm  Abdomen:  Soft, nontender  Extremities: No peripheral edema.  Neurologic: Nonfocal, moving all four extremities  Skin: No lesions  Access: Right IJ PermCath, Lt lower AVF    Basic Metabolic Panel: Recent Labs  Lab 04/21/21 0617 04/22/21 0544 04/23/21 0647 04/25/21 1300 04/26/21 1603  NA 130* 134* 131* 130* 133*  K 4.8 4.4 4.6 4.1 4.7  CL 87* 90* 89* 88* 86*  CO2 28 29 28 29  32  GLUCOSE 115* 98 117* 134* 99  BUN 51* 28* 46* 39* 57*  CREATININE 12.40*  8.07* 11.64* 10.32* 13.86*  CALCIUM 8.7* 9.3 8.9 8.9 8.7*  MG 1.7 1.7 1.8 2.0 2.1  PHOS 6.7* 6.0* 7.0* 6.0* 7.3*     Liver Function Tests: Recent Labs  Lab 04/21/21 0617 04/22/21 0544 04/23/21 0647 04/25/21 1300 04/26/21 1603  AST 13* 11* 13* 13* 17  ALT 6 6 7 7 12   ALKPHOS 51 55 53 58 62  BILITOT 0.4 0.4 0.3 0.2* 0.2*  PROT 8.0 8.3* 8.2* 8.4* 8.4*  ALBUMIN 3.2* 3.4* 3.4* 3.2* 3.3*    No results for input(s): LIPASE, AMYLASE in the last 168 hours. No results for input(s): AMMONIA in the last 168 hours.  CBC: Recent Labs  Lab 04/21/21 0617 04/22/21 0544 04/23/21 0647 04/25/21 1300 04/26/21 1603  WBC 10.7* 9.4 11.4* 8.3 10.7*  NEUTROABS 8.2* 6.5 8.6* 5.2 7.2  HGB 7.4* 8.0* 7.6* 7.3* 7.1*  HCT 22.7* 25.0* 23.6* 22.9* 21.2*  MCV 95.8 94.0 95.9 96.2 94.2  PLT 273 306 285 314 315     Cardiac Enzymes: No results for input(s): CKTOTAL, CKMB, CKMBINDEX, TROPONINI in the last 168 hours.  BNP: Invalid input(s): POCBNP  CBG: No results for input(s): GLUCAP in the last 168 hours.  Microbiology: Results for orders placed or performed during the hospital encounter of 04/17/21  Resp Panel by RT-PCR (Flu A&B, Covid) Nasopharyngeal Swab     Status: None   Collection Time: 04/17/21  1:00 PM   Specimen: Nasopharyngeal Swab; Nasopharyngeal(NP) swabs in vial transport medium  Result Value Ref Range Status   SARS Coronavirus  2 by RT PCR NEGATIVE NEGATIVE Final    Comment: (NOTE) SARS-CoV-2 target nucleic acids are NOT DETECTED.  The SARS-CoV-2 RNA is generally detectable in upper respiratory specimens during the acute phase of infection. The lowest concentration of SARS-CoV-2 viral copies this assay can detect is 138 copies/mL. A negative result does not preclude SARS-Cov-2 infection and should not be used as the sole basis for treatment or other patient management decisions. A negative result may occur with  improper specimen collection/handling, submission of specimen  other than nasopharyngeal swab, presence of viral mutation(s) within the areas targeted by this assay, and inadequate number of viral copies(<138 copies/mL). A negative result must be combined with clinical observations, patient history, and epidemiological information. The expected result is Negative.  Fact Sheet for Patients:  EntrepreneurPulse.com.au  Fact Sheet for Healthcare Providers:  IncredibleEmployment.be  This test is no t yet approved or cleared by the [place] FDA and  has been authorized for detection and/or diagnosis of SARS-CoV-2 by FDA under an Emergency Use Authorization (EUA). This EUA will remain  in effect (meaning this test can be used) for the duration of the COVID-19 declaration under Section 564(b)(1) of the Act, 21 U.S.C.section 360bbb-3(b)(1), unless the authorization is terminated  or revoked sooner.       Influenza A by PCR NEGATIVE NEGATIVE Final   Influenza B by PCR NEGATIVE NEGATIVE Final    Comment: (NOTE) The Xpert Xpress SARS-CoV-2/FLU/RSV plus assay is intended as an aid in the diagnosis of influenza from Nasopharyngeal swab specimens and should not be used as a sole basis for treatment. Nasal washings and aspirates are unacceptable for Xpert Xpress SARS-CoV-2/FLU/RSV testing.  Fact Sheet for Patients: EntrepreneurPulse.com.au  Fact Sheet for Healthcare Providers: IncredibleEmployment.be  This test is not yet approved or cleared by the [place] FDA and has been authorized for detection and/or diagnosis of SARS-CoV-2 by FDA under an Emergency Use Authorization (EUA). This EUA will remain in effect (meaning this test can be used) for the duration of the COVID-19 declaration under Section 564(b)(1) of the Act, 21 U.S.C. section 360bbb-3(b)(1), unless the authorization is terminated or revoked.  Performed at [facility], [address]  [place], [place] [address]   MRSA Next Gen by PCR, Nasal     Status: None   Collection Time: 04/21/21  8:50 AM   Specimen: Nasal Mucosa; Nasal Swab  Result Value Ref Range Status   MRSA by PCR Next Gen NOT DETECTED NOT DETECTED Final    Comment: (NOTE) The GeneXpert MRSA Assay (FDA approved for NASAL specimens only), is one component of a comprehensive MRSA colonization surveillance program. It is not intended to diagnose MRSA infection nor to guide or monitor treatment for MRSA infections. Test performance is not FDA approved in patients less than 17 years old. Performed at [facility], [address] [place], [place] [address]     Coagulation Studies: No results for input(s): LABPROT, INR in the last 72 hours.  Urinalysis: No results for input(s): COLORURINE, LABSPEC, PHURINE, GLUCOSEU, HGBUR, BILIRUBINUR, KETONESUR, PROTEINUR, UROBILINOGEN, NITRITE, LEUKOCYTESUR in the last 72 hours.  Invalid input(s): APPERANCEUR    Imaging: DG Chest 2 View  Result Date: 04/26/2021 CLINICAL DATA:  Volume overload; shortness of breath; htn EXAM: CHEST - 2 VIEW COMPARISON:  the previous day's study FINDINGS: Large left and moderate right pleural effusions as before. Extensive consolidation/atelectasis throughout much of the left lung, and the right lung base. Stable tunneled right IJ hemodialysis catheter to the  cavoatrial junction. Heart size difficult to assess due to adjacent opacities. No pneumothorax. Visualized bones unremarkable. IMPRESSION: Stable bilateral effusions and parenchymal consolidation/atelectasis, left worse than right. Electronically Signed   By: Lucrezia Europe M.D.   On: 04/26/2021 08:13     Medications:    sodium chloride     sodium chloride     sodium chloride      aspirin  325 mg Oral Daily   calcium acetate  1,334 mg Oral TID WC   Chlorhexidine Gluconate Cloth  6 each Topical Q0600   epoetin (EPOGEN/PROCRIT) injection  10,000 Units Intravenous Q M,W,F-HD    guaiFENesin  1,200 mg Oral BID   heparin  5,000 Units Subcutaneous Q8H   ipratropium  0.5 mg Nebulization BID   levalbuterol  0.63 mg Nebulization BID   ondansetron (ZOFRAN) IV  4 mg Intravenous Once   pantoprazole  40 mg Oral Daily   sodium chloride flush  3 mL Intravenous Q12H     Assessment/ Plan:  [name] is a 36 y.o.  male past medical concerns including hypertension and end-stage renal disease on dialysis.  Patient presents to the emergency department from his outpatient dialysis center with complaints of shortness of breath.  Patient will be admitted under observation for Acute pulmonary edema (HCC) [J81.0] Volume overload [E87.70]  [facility]/MWF/right IJ PermCath  ESRD.  Received dialysis yesterday however terminated treatment 2 hours after initiation.  UF goal reduced to 2 L based on limited time on machine.  Next treatment scheduled for Friday if remains inpatient.  2. Anemia of chronic kidney disease  Lab Results  Component Value Date   HGB 7.1 (L) 04/26/2021  EPO prescribed with each dialysis treatment  3. Secondary Hyperparathyroidism:   Lab Results  Component Value Date   CALCIUM 8.7 (L) 04/26/2021   PHOS 7.3 (H) 04/26/2021  Calcium acetate prescribed with meals to manage elevated phosphorus levels.  Calcium remains within desired target   4.  Hypertension with chronic kidney disease.  Continue amlodipine, carvedilol, hydralazine, minoxidil. Receiving all meds during admission. BP elevated at 171/103   LOS: 9   2/2/20239:48 AM

## 2021-04-27 NOTE — Discharge Summary (Signed)
Physician Discharge Summary  Travis Long WYO:[id number] DOB: [birth year] DOA: 04/17/2021  PCP: Pcp, No  Admit date: 04/17/2021 Discharge date: 04/27/2021  Discharge disposition: Home        Recommendations for Outpatient Follow-Up:   Continue outpatient hemodialysis on Mondays, Wednesdays and Fridays Follow-up with Dr. Lanney Gins, pulmonologist in 1 week Follow-up with CT surgeon at [facility]   Discharge Diagnosis:   Principal Problem:   Volume overload Active Problems:   ESRD on hemodialysis ([place])   Hypertension   Anemia in ESRD (end-stage renal disease) ([place])   Pleural effusion   Hypertensive urgency    Discharge Condition: Stable.  Diet recommendation:  Diet Order             Diet renal 60/70-04-27-1198           Diet renal with fluid restriction Fluid restriction: 1200 mL Fluid; Room service appropriate? Yes; Fluid consistency: Thin  Diet effective now                     Code Status: Full Code     Hospital Course:     [name] is a 36 y.o. male with medical history significant for ESRD on hemodialysis on M, W and F, chronic diastolic CHF, anemia of chronic disease, hypertension, depression, recent hospitalization for symptomatic pleural effusion requiring left thoracentesis and left chest tube placement on 03/08/2021.  He presented to the hospital because of shortness of breath secondary to incomplete hemodialysis from dialysis catheter malfunction.   He was admitted to the hospital for volume overload secondary to inadequate hemodialysis because of dialysis catheter malfunction.  Right IJ tunneled dialysis catheter was inserted and he underwent hemodialysis.  He also had acute hypoxic respiratory failure.  Unfortunately, he could not be weaned off oxygen completely.  He was discharged on 2 L/min oxygen for chronic hypoxic respiratory failure.  He has a moderate complex left hydropneumothorax and bilateral pleural effusions.  Pulmonologist was  consulted to assist with management.  He had been evaluated by CT surgeon in the past but he was deemed not to be a candidate for surgery.  Pulmonologist recommended follow-up with CT surgeon at a tertiary hospital for further  management.  His condition has improved and he is deemed stable for discharge to home today.   Medical Consultants:   Nephrologist Pulmonologist   Discharge Exam:    Vitals:   04/26/21 2028 04/27/21 0527 04/27/21 0734 04/27/21 0759  BP: (!) 160/94 (!) 150/80  (!) 171/103  Pulse: 98 95  99  Resp: 19 19  18   Temp: 98.4 F (36.9 C)   97.9 F (36.6 C)  TempSrc:    Oral  SpO2: 96% 98% 99% 90%  Weight:      Height:         GEN: NAD SKIN: Warm and dry EYES: No pallor or icterus ENT: MMM CV: RRR PULM: CTA B ABD: soft, ND, NT, +BS CNS: AAO x 3, non focal EXT: No edema or tenderness   The results of significant diagnostics from this hospitalization (including imaging, microbiology, ancillary and laboratory) are listed below for reference.     Procedures and Diagnostic Studies:   DG Chest 2 View  Result Date: 04/18/2021 CLINICAL DATA:  Shortness of breath.  End-stage renal disease. EXAM: CHEST - 2 VIEW COMPARISON:  04/17/2021 FINDINGS: No critical change since yesterday. Central line tip in the SVC above the right atrium. Bilateral pleural fluid collections, larger on the left than the  right. Multiple air-fluid levels on the left appears similar. Associated pulmonary atelectasis/infiltrate appears the same. IMPRESSION: No change since yesterday. Bilateral effusions, larger on the left than the right, with multiple air-fluid levels on the left. Associated pulmonary volume loss/infiltrate. Electronically Signed   By: Nelson Chimes M.D.   On: 04/18/2021 10:21   DG Chest 2 View  Result Date: 04/17/2021 CLINICAL DATA:  Shortness of breath.  Dialysis. EXAM: CHEST - 2 VIEW COMPARISON:  March 15, 2021 FINDINGS: Dual lumen dialysis catheter with tip projecting  over the superior cavoatrial junction. The heart size and mediastinal contours are obscured. Large right pleural effusion with atelectasis versus infiltrate involving the left lung. Two focal gas fluid levels in the left hemithorax 1 of the mid lung level the other at the apex may reflect focal areas of aerated lung but abscess not excluded consider further evaluation with chest CT. Small right pleural effusion. Bilateral pulmonary edema. The visualized skeletal structures are unchanged. IMPRESSION: Large left pleural effusion with a combination of edema and atelectasis versus infiltrate in the left lung, additionally there are 2 focal gas fluid-levels in the left hemithorax 1 of the mid lung level the other in the apex which may reflect focal areas of aerated lung but abscess not excluded, consider further evaluation with chest CT. Small right pleural effusion with right lung atelectasis and edema. Electronically Signed   By: Dahlia Bailiff M.D.   On: 04/17/2021 11:19   CT Chest Wo Contrast  Result Date: 04/17/2021 CLINICAL DATA:  Pneumonia. Complication suspected. Abnormal chest radiograph. After 3 hours of dialysis treatment today common dialysis machine with stopping was told he needed dialysis access changed out. Hypertension. Patient has not felt well for 1 week. EXAM: CT CHEST WITHOUT CONTRAST TECHNIQUE: Multidetector CT imaging of the chest was performed following the standard protocol without IV contrast. RADIATION DOSE REDUCTION: This exam was performed according to the departmental dose-optimization program which includes automated exposure control, adjustment of the mA and/or kV according to patient size and/or use of iterative reconstruction technique. COMPARISON:  Chest radiographs 04/17/2021, AP chest 03/15/2021, 03/09/2021, 09/16/2020; CT chest 03/06/2021 FINDINGS: Right internal jugular dual-lumen central venous catheter with tips within the central superior vena cava. Cardiovascular: Heart size  is again mildly enlarged. Trace pericardial fluid is slightly increased from prior. No thoracic aortic aneurysm. Mediastinum/Nodes: No pathologically enlarged axillary lymph nodes by CT criteria. There are again multiple mildly enlarged mediastinal lymph nodes. The largest is again seen within the inferior right paratracheal region measuring up to 1.3 cm in short axis, not significantly changed from prior when measured in a similar manner. Again these are likely reactive. No definite hilar lymphadenopathy is seen, within the limitations of lack of IV contrast.The thyroid gland is grossly unremarkable. The esophagus follows a normal course of normal caliber. Lungs/Pleura: There is again opacification of the distal segmental left lower lobe airways. There is again high-grade consolidation within the posterior left lower lobe. Moderate bilateral pleural effusions are mildly increased from prior. Interval increase in air component of left-sided hydropneumothorax. This includes an air component measuring up to approximately 2.8 cm in craniocaudal dimension on the current CT, increased from 2.1 cm on 03/06/2021 CT (as measured on sagittal series 6, image 122). There is also interval increase in air within the more inferior posterolateral hydropneumothorax component on this supine CT with air newly seen within the non dependent regions of the left mid and lower lung (axial series 3, images 84 through 99). Previously a small  amount of air was seen within the posteromedial left lower lobe pleural space, and this is still mild but slightly increased from prior (axial series 3 images 63 through 84). Overall there is interval increase in fluid and air components of this left-sided hydropneumothorax. There again mild right peripheral paraseptal cystic emphysematous changes. Upper Abdomen: There is partial visualization of the kidneys which demonstrate moderate to high-grade atrophy consistent with known chronic kidney disease.  Moderate to high-grade bilateral gynecomastia is again noted. Musculoskeletal: There is diffuse increased bone density again seen, consistent with chronic renal failure. IMPRESSION:: IMPRESSION: 1. Compared to 03/06/2021, there is interval increase in the fluid and air components of a moderate left hydropneumothorax. Interval mild increase in moderate bilateral pleural effusions. There again appear to be multiple loculations within the hydropneumothorax, also increased from prior. 2. There is again high-grade consolidation of the posterior left lower lobe either pneumonia versus round atelectasis. 3. Note is made of prior 03/09/2021 basilar pleural pigtail catheter placement in the region of this hydropneumothorax on prior 03/09/2021 radiographs. Following removal of this pigtail catheter, the extrapleural air and fluid appears to have increased over time. Electronically Signed   By: Yvonne Kendall M.D.   On: 04/17/2021 14:17   PERIPHERAL VASCULAR CATHETERIZATION  Result Date: 04/18/2021 See surgical note for result.    Labs:   Basic Metabolic Panel: Recent Labs  Lab 04/21/21 0617 04/22/21 0544 04/23/21 0647 04/25/21 1300 04/26/21 1603  NA 130* 134* 131* 130* 133*  K 4.8 4.4 4.6 4.1 4.7  CL 87* 90* 89* 88* 86*  CO2 28 29 28 29  32  GLUCOSE 115* 98 117* 134* 99  BUN 51* 28* 46* 39* 57*  CREATININE 12.40* 8.07* 11.64* 10.32* 13.86*  CALCIUM 8.7* 9.3 8.9 8.9 8.7*  MG 1.7 1.7 1.8 2.0 2.1  PHOS 6.7* 6.0* 7.0* 6.0* 7.3*   GFR Estimated Creatinine Clearance: 7.6 mL/min (A) (by C-G formula based on SCr of 13.86 mg/dL (H)). Liver Function Tests: Recent Labs  Lab 04/21/21 0617 04/22/21 0544 04/23/21 0647 04/25/21 1300 04/26/21 1603  AST 13* 11* 13* 13* 17  ALT 6 6 7 7 12   ALKPHOS 51 55 53 58 62  BILITOT 0.4 0.4 0.3 0.2* 0.2*  PROT 8.0 8.3* 8.2* 8.4* 8.4*  ALBUMIN 3.2* 3.4* 3.4* 3.2* 3.3*   No results for input(s): LIPASE, AMYLASE in the last 168 hours. No results for input(s):  AMMONIA in the last 168 hours. Coagulation profile No results for input(s): INR, PROTIME in the last 168 hours.  CBC: Recent Labs  Lab 04/21/21 0617 04/22/21 0544 04/23/21 0647 04/25/21 1300 04/26/21 1603  WBC 10.7* 9.4 11.4* 8.3 10.7*  NEUTROABS 8.2* 6.5 8.6* 5.2 7.2  HGB 7.4* 8.0* 7.6* 7.3* 7.1*  HCT 22.7* 25.0* 23.6* 22.9* 21.2*  MCV 95.8 94.0 95.9 96.2 94.2  PLT 273 306 285 314 315   Cardiac Enzymes: No results for input(s): CKTOTAL, CKMB, CKMBINDEX, TROPONINI in the last 168 hours. BNP: Invalid input(s): POCBNP CBG: No results for input(s): GLUCAP in the last 168 hours. D-Dimer No results for input(s): DDIMER in the last 72 hours. Hgb A1c No results for input(s): HGBA1C in the last 72 hours. Lipid Profile No results for input(s): CHOL, HDL, LDLCALC, TRIG, CHOLHDL, LDLDIRECT in the last 72 hours. Thyroid function studies No results for input(s): TSH, T4TOTAL, T3FREE, THYROIDAB in the last 72 hours.  Invalid input(s): FREET3 Anemia work up No results for input(s): VITAMINB12, FOLATE, FERRITIN, TIBC, IRON, RETICCTPCT in the last 72 hours.  Microbiology Recent Results (from the past 240 hour(s))  MRSA Next Gen by PCR, Nasal     Status: None   Collection Time: 04/21/21  8:50 AM   Specimen: Nasal Mucosa; Nasal Swab  Result Value Ref Range Status   MRSA by PCR Next Gen NOT DETECTED NOT DETECTED Final    Comment: (NOTE) The GeneXpert MRSA Assay (FDA approved for NASAL specimens only), is one component of a comprehensive MRSA colonization surveillance program. It is not intended to diagnose MRSA infection nor to guide or monitor treatment for MRSA infections. Test performance is not FDA approved in patients less than 51 years old. Performed at [facility], [address] [place], [place] [address]      Discharge Instructions:   Discharge Instructions     Diet renal 60/70-04-27-1198   Complete by: As directed    Discharge instructions   Complete by:  As directed    Continue hemodialysis on Mondays, Wednesdays and Fridays. Follow up with pulmonologist who will help coordinate follow up with cardiothoracic surgeon at [facility]   Discharge wound care:   Complete by: As directed    Keep permcath covered and dry   Increase activity slowly   Complete by: As directed       Allergies as of 04/27/2021   No Known Allergies      Medication List     TAKE these medications    amLODipine 10 MG tablet Commonly known as: NORVASC Take 1 tablet (10 mg total) by mouth daily.   calcium acetate 667 MG capsule Commonly known as: PHOSLO Take 2 capsules (1,334 mg total) by mouth 3 (three) times daily with meals.   carvedilol 25 MG tablet Commonly known as: COREG Take 1 tablet (25 mg total) by mouth 2 (two) times daily with a meal.   dicyclomine 10 MG capsule Commonly known as: BENTYL Take 1 capsule (10 mg total) by mouth every 6 (six) hours as needed (nausea, emesis).   epoetin alfa 10000 UNIT/ML injection Commonly known as: EPOGEN Inject 0.4 mLs (4,000 Units total) into the vein every Monday, Wednesday, and Friday with hemodialysis.   hydrALAZINE 100 MG tablet Commonly known as: APRESOLINE Take 1 tablet (100 mg total) by mouth every 8 (eight) hours.   minoxidil 2.5 MG tablet Commonly known as: LONITEN Take 2 tablets (5 mg total) by mouth daily.   pantoprazole 40 MG tablet Commonly known as: PROTONIX Take 1 tablet (40 mg total) by mouth daily.               Durable Medical Equipment  (From admission, onward)           Start     Ordered   04/24/21 1205  For home use only DME oxygen  Once       Question Answer Comment  Length of Need 6 Months   Mode or (Route) Nasal cannula   Liters per Minute 3   Frequency Continuous (stationary and portable oxygen unit needed)   Oxygen delivery system Gas      04/24/21 1204              Discharge Care Instructions  (From admission, onward)            Start     Ordered   04/27/21 0000  Discharge wound care:       Comments: Keep permcath covered and dry   04/27/21 0932  Follow-up Information     [name], MD. Go on 06/12/2021.   Specialty: Family Medicine Why: New primary care appointment. 3:00. Contact information: [place] [id number] [phone]         Ottie Glazier, MD. Schedule an appointment as soon as possible for a visit in 1 week(s).   Specialty: Pulmonary Disease Why: Patient to call and make own follow up appt per office Contact information: [place] [id number] [phone]                   If you experience worsening of your admission symptoms, develop shortness of breath, life threatening emergency, suicidal or homicidal thoughts you must seek medical attention immediately by calling 911 or calling your MD immediately  if symptoms less severe.   You must read complete instructions/literature along with all the possible adverse reactions/side effects for all the medicines you take and that have been prescribed to you. Take any new medicines after you have completely understood and accept all the possible adverse reactions/side effects.    Please note   You were cared for by a hospitalist during your hospital stay. If you have any questions about your discharge medications or the care you received while you were in the hospital after you are discharged, you can call the unit and asked to speak with the hospitalist on call if the hospitalist that took care of you is not available. Once you are discharged, your primary care physician will handle any further medical issues. Please note that NO REFILLS for any discharge medications will be authorized once you are discharged, as it is imperative that you return to your primary care physician (or establish a relationship with a primary care physician if you do not have one)  for your aftercare needs so that they can reassess your need for medications and monitor your lab values.       Time coordinating discharge: Greater than 30 minutes  Signed:  Patrese Neal  [facility] 04/27/2021, 3:15 PM   Pager on [email or web address]. If 7PM-7AM, please contact night-coverage at [email or web address]

## 2021-04-27 NOTE — Progress Notes (Signed)
PULMONOLOGY         Date: 04/27/2021,   MRN# [id number] Travis Long Oct 10, 1985     AdmissionWeight: 79.4 kg                 CurrentWeight: 80.3 kg   Referring physician: Dr Theone Murdoch   CHIEF COMPLAINT:   Recurrent pleural effusion - bilateral worse on left   HISTORY OF PRESENT ILLNESS   This is a 36 yo male with hx of recurrent pleural effusion with hx of ESRD on HD MWF s/p R IJ HD access placed. PCCM consulted to evaluate recurrent pleural left pleural effusion.   I reviewed CT chest with findings of bronchiectatic lungs and overlying moderate pleural effusions bilaterally with ex-vacuo physiology on right hemithorax due to rounded atelectasis and what appears to be lung entrapment.  He had thoracic surgery evaluation at [facility] and it was felt by surgery that it would be more harm to patient if he was to undergo decortication and invasive thoracoscopic management.He should be able to recruit more lung function via diuresis or more aggressive HD due to findings of bilateral effusions with compressive atelectasis.    04/20/21- patient is stable for dc home on room air. He is asked to follow up with [facility] or [facility] with thoracic surgery due to advanced complex loculated left pleural effusion with lung entrapment and ex-vacuo physiology. Patient is thankful and agreeable to plan.   04/24/21- patient is stable on room air, he reports no new events overnight. He is improved and has better ventilation on left chest during auscultation. He had CXR with slight left sided improvement. He continues to have HD and reports right neck discomfort from catheter. We discussed outpatient follow up with [facility] thoracic surgery for additional options to treat chronic hydropneumothorax. He wishes to take least risky approach and shares he is young and has children does not want to do anything with elevated risk.  04/27/21 - Plan for dc home today , patient is stable.   PAST MEDICAL HISTORY    Past Medical History:  Diagnosis Date   ESRD on hemodialysis [facility])    Hypertension      SURGICAL HISTORY   Past Surgical History:  Procedure Laterality Date   A/V FISTULAGRAM Left 03/09/2021   Procedure: A/V Fistulagram;  Surgeon: Algernon Huxley, MD;  Location: [place] [facility];  Service: Cardiovascular;  Laterality: Left;   AV FISTULA PLACEMENT Left    DIALYSIS/PERMA CATHETER INSERTION N/A 04/18/2021   Procedure: DIALYSIS/PERMA CATHETER INSERTION;  Surgeon: Katha Cabal, MD;  Location: [place] [facility];  Service: Cardiovascular;  Laterality: N/A;   IR CATHETER TUBE CHANGE  03/09/2021     FAMILY HISTORY   Family History  Problem Relation Age of Onset   Hypertension Mother    Diabetes Mellitus II Mother      SOCIAL HISTORY   Social History   Tobacco Use   Smoking status: Former    Types: Cigarettes   Smokeless tobacco: Never  Substance Use Topics   Alcohol use: Not Currently   Drug use: Never     MEDICATIONS    Home Medication:    Current Medication:  Current Facility-Administered Medications:    0.9 %  sodium chloride infusion, 100 mL, Intravenous, PRN, Schnier, [name], MD   0.9 %  sodium chloride infusion, 100 mL, Intravenous, PRN, Schnier, [name], MD   0.9 %  sodium chloride infusion, 250 mL, Intravenous, PRN, Schnier, [name],  MD   alteplase (CATHFLO ACTIVASE) injection 2 mg, 2 mg, Intracatheter, Once PRN, Schnier, [name], MD   aspirin tablet 325 mg, 325 mg, Oral, Daily, Schnier, [name], MD, 325 mg at 04/27/21 1011   calcium acetate (PHOSLO) capsule 1,334 mg, 1,334 mg, Oral, TID WC, Schnier, [name], MD, 1,334 mg at 04/26/21 1215   Chlorhexidine Gluconate Cloth 2 % PADS 6 each, 6 each, Topical, Q0600, Schnier, [name], MD, 6 each at 04/19/21 0730   epoetin alfa (EPOGEN) injection 10,000 Units, 10,000 Units, Intravenous, Q M,W,F-HD, Colon Flattery, NP, 10,000 Units at 04/26/21 1716   guaiFENesin (MUCINEX) 12 hr tablet  1,200 mg, 1,200 mg, Oral, BID, Sheikh, Omair Latif, DO   heparin injection 1,000 Units, 1,000 Units, Dialysis, PRN, Delana Meyer, [name], MD, 3,300 Units at 04/24/21 1357   heparin injection 5,000 Units, 5,000 Units, Subcutaneous, Q8H, Schnier, [name], MD, 5,000 Units at 04/18/21 2308   HYDROcodone-acetaminophen (NORCO/VICODIN) 5-325 MG per tablet 1 tablet, 1 tablet, Oral, Q6H PRN, Max Sane, MD, 1 tablet at 04/26/21 0035   ipratropium (ATROVENT) nebulizer solution 0.5 mg, 0.5 mg, Nebulization, BID, Sheikh, Omair Latif, DO, 0.5 mg at 04/27/21 0734   levalbuterol (XOPENEX) nebulizer solution 0.63 mg, 0.63 mg, Nebulization, BID, Sheikh, Omair Latif, DO, 0.63 mg at 04/27/21 0734   lidocaine (PF) (XYLOCAINE) 1 % injection 5 mL, 5 mL, Intradermal, PRN, Schnier, [name], MD   lidocaine-prilocaine (EMLA) cream 1 application, 1 application, Topical, PRN, Schnier, [name], MD   ondansetron [facility]) injection 4 mg, 4 mg, Intravenous, Once, Foust, Katy L, NP   ondansetron (ZOFRAN) injection 4 mg, 4 mg, Intravenous, Q6H PRN, Sheikh, Omair Latif, DO, 4 mg at 04/25/21 1853   pantoprazole (PROTONIX) EC tablet 40 mg, 40 mg, Oral, Daily, Schnier, [name], MD, 40 mg at 04/26/21 0623   pentafluoroprop-tetrafluoroeth (GEBAUERS) aerosol 1 application, 1 application, Topical, PRN, Schnier, [name], MD   sodium chloride flush (NS) 0.9 % injection 3 mL, 3 mL, Intravenous, Q12H, Schnier, [name], MD, 3 mL at 04/27/21 1013   sodium chloride flush (NS) 0.9 % injection 3 mL, 3 mL, Intravenous, PRN, Schnier, [name], MD    ALLERGIES   Patient has no known allergies.     REVIEW OF SYSTEMS    Review of Systems:  Gen:  Denies  fever, sweats, chills weigh loss  HEENT: Denies blurred vision, double vision, ear pain, eye pain, hearing loss, nose bleeds, sore throat Cardiac:  No dizziness, chest pain or heaviness, chest tightness,edema Resp:   Denies cough or sputum porduction, shortness of  breath,wheezing, hemoptysis,  Gi: Denies swallowing difficulty, stomach pain, nausea or vomiting, diarrhea, constipation, bowel incontinence Gu:  Denies bladder incontinence, burning urine Ext:   Denies Joint pain, stiffness or swelling Skin: Denies  skin rash, easy bruising or bleeding or hives Endoc:  Denies polyuria, polydipsia , polyphagia or weight change Psych:   Denies depression, insomnia or hallucinations   Other:  All other systems negative   VS: BP (!) 171/103 (BP Location: Right Arm)    Pulse 99    Temp 97.9 F (36.6 C) (Oral)    Resp 18    Ht 5\' 9"  (1.753 m)    Wt 80.3 kg    SpO2 90%    BMI 26.14 kg/m      PHYSICAL EXAM    GENERAL:NAD, no fevers, chills, no weakness no fatigue HEAD: Normocephalic, atraumatic.  EYES: Pupils equal, round, reactive to light. Extraocular muscles intact. No  scleral icterus.  MOUTH: Moist mucosal membrane. Dentition intact. No abscess noted.  EAR, NOSE, THROAT: Clear without exudates. No external lesions.  NECK: Supple. No thyromegaly. No nodules. No JVD.  PULMONARY: decreased air entry bilaterally worse on left CARDIOVASCULAR: S1 and S2. Regular rate and rhythm. No murmurs, rubs, or gallops. No edema. Pedal pulses 2+ bilaterally.  GASTROINTESTINAL: Soft, nontender, nondistended. No masses. Positive bowel sounds. No hepatosplenomegaly.  MUSCULOSKELETAL: No swelling, clubbing, or edema. Range of motion full in all extremities.  NEUROLOGIC: Cranial nerves II through XII are intact. No gross focal neurological deficits. Sensation intact. Reflexes intact.  SKIN: No ulceration, lesions, rashes, or cyanosis. Skin warm and dry. Turgor intact.  PSYCHIATRIC: Mood, affect within normal limits. The patient is awake, alert and oriented x 3. Insight, judgment intact.       IMAGING    DG Chest 2 View  Result Date: 04/26/2021 CLINICAL DATA:  Volume overload; shortness of breath; htn EXAM: CHEST - 2 VIEW COMPARISON:  the previous day's study FINDINGS:  Large left and moderate right pleural effusions as before. Extensive consolidation/atelectasis throughout much of the left lung, and the right lung base. Stable tunneled right IJ hemodialysis catheter to the cavoatrial junction. Heart size difficult to assess due to adjacent opacities. No pneumothorax. Visualized bones unremarkable. IMPRESSION: Stable bilateral effusions and parenchymal consolidation/atelectasis, left worse than right. Electronically Signed   By: Lucrezia Europe M.D.   On: 04/26/2021 08:13   DG Chest 2 View  Result Date: 04/18/2021 CLINICAL DATA:  Shortness of breath.  End-stage renal disease. EXAM: CHEST - 2 VIEW COMPARISON:  04/17/2021 FINDINGS: No critical change since yesterday. Central line tip in the SVC above the right atrium. Bilateral pleural fluid collections, larger on the left than the right. Multiple air-fluid levels on the left appears similar. Associated pulmonary atelectasis/infiltrate appears the same. IMPRESSION: No change since yesterday. Bilateral effusions, larger on the left than the right, with multiple air-fluid levels on the left. Associated pulmonary volume loss/infiltrate. Electronically Signed   By: Nelson Chimes M.D.   On: 04/18/2021 10:21   DG Chest 2 View  Result Date: 04/17/2021 CLINICAL DATA:  Shortness of breath.  Dialysis. EXAM: CHEST - 2 VIEW COMPARISON:  March 15, 2021 FINDINGS: Dual lumen dialysis catheter with tip projecting over the superior cavoatrial junction. The heart size and mediastinal contours are obscured. Large right pleural effusion with atelectasis versus infiltrate involving the left lung. Two focal gas fluid levels in the left hemithorax 1 of the mid lung level the other at the apex may reflect focal areas of aerated lung but abscess not excluded consider further evaluation with chest CT. Small right pleural effusion. Bilateral pulmonary edema. The visualized skeletal structures are unchanged. IMPRESSION: Large left pleural effusion with a  combination of edema and atelectasis versus infiltrate in the left lung, additionally there are 2 focal gas fluid-levels in the left hemithorax 1 of the mid lung level the other in the apex which may reflect focal areas of aerated lung but abscess not excluded, consider further evaluation with chest CT. Small right pleural effusion with right lung atelectasis and edema. Electronically Signed   By: Dahlia Bailiff M.D.   On: 04/17/2021 11:19   CT Chest Wo Contrast  Result Date: 04/17/2021 CLINICAL DATA:  Pneumonia. Complication suspected. Abnormal chest radiograph. After 3 hours of dialysis treatment today common dialysis machine with stopping was told he needed dialysis access changed out. Hypertension. Patient has not felt well for 1 week. EXAM: CT CHEST  WITHOUT CONTRAST TECHNIQUE: Multidetector CT imaging of the chest was performed following the standard protocol without IV contrast. RADIATION DOSE REDUCTION: This exam was performed according to the departmental dose-optimization program which includes automated exposure control, adjustment of the mA and/or kV according to patient size and/or use of iterative reconstruction technique. COMPARISON:  Chest radiographs 04/17/2021, AP chest 03/15/2021, 03/09/2021, 09/16/2020; CT chest 03/06/2021 FINDINGS: Right internal jugular dual-lumen central venous catheter with tips within the central superior vena cava. Cardiovascular: Heart size is again mildly enlarged. Trace pericardial fluid is slightly increased from prior. No thoracic aortic aneurysm. Mediastinum/Nodes: No pathologically enlarged axillary lymph nodes by CT criteria. There are again multiple mildly enlarged mediastinal lymph nodes. The largest is again seen within the inferior right paratracheal region measuring up to 1.3 cm in short axis, not significantly changed from prior when measured in a similar manner. Again these are likely reactive. No definite hilar lymphadenopathy is seen, within the  limitations of lack of IV contrast.The thyroid gland is grossly unremarkable. The esophagus follows a normal course of normal caliber. Lungs/Pleura: There is again opacification of the distal segmental left lower lobe airways. There is again high-grade consolidation within the posterior left lower lobe. Moderate bilateral pleural effusions are mildly increased from prior. Interval increase in air component of left-sided hydropneumothorax. This includes an air component measuring up to approximately 2.8 cm in craniocaudal dimension on the current CT, increased from 2.1 cm on 03/06/2021 CT (as measured on sagittal series 6, image 122). There is also interval increase in air within the more inferior posterolateral hydropneumothorax component on this supine CT with air newly seen within the non dependent regions of the left mid and lower lung (axial series 3, images 84 through 99). Previously a small amount of air was seen within the posteromedial left lower lobe pleural space, and this is still mild but slightly increased from prior (axial series 3 images 63 through 84). Overall there is interval increase in fluid and air components of this left-sided hydropneumothorax. There again mild right peripheral paraseptal cystic emphysematous changes. Upper Abdomen: There is partial visualization of the kidneys which demonstrate moderate to high-grade atrophy consistent with known chronic kidney disease. Moderate to high-grade bilateral gynecomastia is again noted. Musculoskeletal: There is diffuse increased bone density again seen, consistent with chronic renal failure. IMPRESSION:: IMPRESSION: 1. Compared to 03/06/2021, there is interval increase in the fluid and air components of a moderate left hydropneumothorax. Interval mild increase in moderate bilateral pleural effusions. There again appear to be multiple loculations within the hydropneumothorax, also increased from prior. 2. There is again high-grade consolidation of  the posterior left lower lobe either pneumonia versus round atelectasis. 3. Note is made of prior 03/09/2021 basilar pleural pigtail catheter placement in the region of this hydropneumothorax on prior 03/09/2021 radiographs. Following removal of this pigtail catheter, the extrapleural air and fluid appears to have increased over time. Electronically Signed   By: Yvonne Kendall M.D.   On: 04/17/2021 14:17   PERIPHERAL VASCULAR CATHETERIZATION  Result Date: 04/18/2021 See surgical note for result.  DG Chest Port 1 View  Result Date: 04/25/2021 CLINICAL DATA:  Shortness of breath EXAM: PORTABLE CHEST 1 VIEW COMPARISON:  04/23/2021 FINDINGS: Dialysis catheter is again noted. Cardiac shadow is stable. Persistent bilateral effusions are noted. Left hydropneumothorax is again noted and stable. Patchy basilar opacities are noted left greater than right. IMPRESSION: Stable appearance of the chest when compared with the prior exam. Electronically Signed   By: Inez Catalina  M.D.   On: 04/25/2021 02:40   DG Chest Port 1 View  Result Date: 04/23/2021 CLINICAL DATA:  Shortness of breath. EXAM: PORTABLE CHEST 1 VIEW COMPARISON:  Portable chest 04/21/2021, chest CT 04/17/2021. FINDINGS: Right IJ dialysis catheter is stable, with tip at the superior cavoatrial junction. Complex and sizable encompassing left hydropneumothorax appears similar to the prior studies. There is associated atelectasis or consolidation in the partially aerated left lung, more so in the lower zone. On the right, moderate layering pleural effusion persists unchanged. There is hazy atelectasis or pneumonia in the right base alongside the effusion, aside from which right lung is otherwise clear. Cardiac size is stable but not optimally seen. There is mild central vascular distension. Stable mediastinum. IMPRESSION: Stable overall aeration. There is perihilar vascular congestion without appreciable edema. Sizable encompassing and complex left  hydropneumothorax shows no obvious interval change as well as underlying opacities in the compressed left lung. Layering right pleural fluid appears stable as well as hazy overlying atelectasis or consolidation. Electronically Signed   By: Telford Nab M.D.   On: 04/23/2021 05:10   DG Chest Port 1 View  Result Date: 04/21/2021 CLINICAL DATA:  Shortness of breath EXAM: PORTABLE CHEST 1 VIEW COMPARISON:  Previous studies including the examination of 04/18/2021 FINDINGS: Tip of right IJ dialysis catheter is seen at the junction of superior vena cava and right atrium. Bilateral pleural effusions are seen, more so on the left side. There is improvement in aeration of right lower lung fields. Air-fluid levels seen in the left lung are less evident, possibly due to semi upright position of the patient in the current study. IMPRESSION: There is interval improvement in aeration of right lower lung fields suggesting decrease in atelectasis/pneumonia. Bilateral pleural effusions, more so on the left side. There are patchy densities in the left mid and left lower lung fields suggesting atelectasis/pneumonia. Electronically Signed   By: Elmer Picker M.D.   On: 04/21/2021 17:31      ASSESSMENT/PLAN   Bilateral pleural effusions worse on left with lung entrapment         - patient is not on oxygen therapy          - he had thoracic surgery evaluation and is not candidate for surgery          -he improved with HD          -patient has been referred to [facility] for second opinion we discussed this at length and he agrees to follow up on outpatient.  He is stable and does have episodes of transient hypoxemia with exertion and will have O2 prescribed.     ESRD on HD  - patient does have fluid overload and would benefit from aggressive removal of fluid to help with pleural effusions   Major depression    - patient lost job and car and this is due to medical disease, recommend outpatient follow up with  psychiatry       Thank you for allowing me to participate in the care of this patient.    Patient/Family are satisfied with care plan and all questions have been answered.  This document was prepared using Dragon voice recognition software and may include unintentional dictation errors.     Ottie Glazier, M.D.  Division of [place]

## 2021-05-12 NOTE — Telephone Encounter (Signed)
Pt was discharged from hospital 2/2.

## 2021-06-07 ENCOUNTER — Other Ambulatory Visit (INDEPENDENT_AMBULATORY_CARE_PROVIDER_SITE_OTHER): Payer: Self-pay | Admitting: Vascular Surgery

## 2021-06-07 ENCOUNTER — Ambulatory Visit (INDEPENDENT_AMBULATORY_CARE_PROVIDER_SITE_OTHER): Payer: Medicare Other | Admitting: Nurse Practitioner

## 2021-06-07 ENCOUNTER — Encounter (INDEPENDENT_AMBULATORY_CARE_PROVIDER_SITE_OTHER): Payer: Self-pay | Admitting: Nurse Practitioner

## 2021-06-07 ENCOUNTER — Ambulatory Visit (INDEPENDENT_AMBULATORY_CARE_PROVIDER_SITE_OTHER): Payer: Medicare Other

## 2021-06-07 ENCOUNTER — Other Ambulatory Visit: Payer: Self-pay

## 2021-06-07 DIAGNOSIS — N186 End stage renal disease: Secondary | ICD-10-CM

## 2021-06-14 ENCOUNTER — Encounter (INDEPENDENT_AMBULATORY_CARE_PROVIDER_SITE_OTHER): Payer: Self-pay | Admitting: Nurse Practitioner

## 2021-06-15 ENCOUNTER — Telehealth (INDEPENDENT_AMBULATORY_CARE_PROVIDER_SITE_OTHER): Payer: Self-pay

## 2021-06-15 NOTE — Telephone Encounter (Signed)
I spoke with the patient's mother regarding having the patient return my call to be scheduled for a right arm fistulagram with Dr. Lucky Cowboy. ?

## 2021-06-16 NOTE — Telephone Encounter (Signed)
Spoke with the patient's mother and he is scheduled with Dr. Lucky Cowboy for a right arm fistulagram on 06/22/21 with a 6:45 am arrival time to the [facility]. Pre-procedure instructions were discussed and will be mailed. ?

## 2021-06-18 NOTE — H&P (View-Only) (Signed)
? ?Subjective:  ? ? Patient ID: Travis Long, male    DOB: [birth year], 36 y.o.   MRN: [id number] ?Chief Complaint  ?Patient presents with  ? Establish Care  ?  Referred by Dr Inocente Salles  HDA/Consult  ? ? ?Travis Long is a 36 year old male that presents today for evaluation of office access he notes that his right radiocephalic AV fistula is difficult to cannulate due to its tortuosity.  He is currently maintained with a PermCath with no issues.  He currently has a flow volume of 1936 with some stenosis noted in the mid area. ? ? ?Review of Systems  ?All other systems reviewed and are negative. ? ?   ?Objective:  ? Physical Exam ?Vitals reviewed.  ?HENT:  ?   Head: Normocephalic.  ?Cardiovascular:  ?   Rate and Rhythm: Normal rate.  ?   Pulses: Normal pulses.  ?Pulmonary:  ?   Effort: Pulmonary effort is normal.  ?Skin: ?   General: Skin is warm and dry.  ?Neurological:  ?   Mental Status: He is alert and oriented to person, place, and time.  ?Psychiatric:     ?   Mood and Affect: Mood normal.     ?   Behavior: Behavior normal.     ?   Thought Content: Thought content normal.     ?   Judgment: Judgment normal.  ? ? ?BP (!) 178/103 (BP Location: Right Arm)   Pulse 94   Resp 17   Ht 5\' 8"  (1.727 m)   Wt 186 lb 12.8 oz (84.7 kg)   BMI 28.40 kg/m?  ? ?Past Medical History:  ?Diagnosis Date  ? ESRD on hemodialysis ([place])   ? Hypertension   ? ? ?Social History  ? ?Socioeconomic History  ? Marital status: Single  ?  Spouse name: Not on file  ? Number of children: Not on file  ? Years of education: Not on file  ? Highest education level: Not on file  ?Occupational History  ? Not on file  ?Tobacco Use  ? Smoking status: Former  ?  Types: Cigarettes  ? Smokeless tobacco: Never  ?Substance and Sexual Activity  ? Alcohol use: Not Currently  ? Drug use: Never  ? Sexual activity: Not on file  ?Other Topics Concern  ? Not on file  ?Social History Narrative  ? Not on file  ? ?Social Determinants of Health  ? ?Financial Resource  Strain: Not on file  ?Food Insecurity: Not on file  ?Transportation Needs: Not on file  ?Physical Activity: Not on file  ?Stress: Not on file  ?Social Connections: Not on file  ?Intimate Partner Violence: Not on file  ? ? ?Past Surgical History:  ?Procedure Laterality Date  ? A/V FISTULAGRAM Left 03/09/2021  ? Procedure: A/V Fistulagram;  Surgeon: Algernon Huxley, MD;  Location: [facility];  Service: Cardiovascular;  Laterality: Left;  ? AV FISTULA PLACEMENT Left   ? DIALYSIS/PERMA CATHETER INSERTION N/A 04/18/2021  ? Procedure: DIALYSIS/PERMA CATHETER INSERTION;  Surgeon: Katha Cabal, MD;  Location: [facility];  Service: Cardiovascular;  Laterality: N/A;  ? IR CATHETER TUBE CHANGE  03/09/2021  ? ? ?Family History  ?Problem Relation Age of Onset  ? Hypertension Mother   ? Diabetes Mellitus II Mother   ? ? ?No Known Allergies ? ? ?  Latest Ref Rng & Units 04/26/2021  ?  4:03 PM 04/25/2021  ?  1:00 PM 04/23/2021  ?  6:47 AM  ?  CBC  ?WBC 4.0 - 10.5 K/uL 10.7   8.3   11.4    ?Hemoglobin 13.0 - 17.0 g/dL 7.1   7.3   7.6    ?Hematocrit 39.0 - 52.0 % 21.2   22.9   23.6    ?Platelets 150 - 400 K/uL 315   314   285    ? ? ? ? ?CMP  ?   ?Component Value Date/Time  ? NA 133 (L) 04/26/2021 1603  ? K 4.7 04/26/2021 1603  ? CL 86 (L) 04/26/2021 1603  ? CO2 32 04/26/2021 1603  ? GLUCOSE 99 04/26/2021 1603  ? BUN 57 (H) 04/26/2021 1603  ? CREATININE 13.86 (H) 04/26/2021 1603  ? CALCIUM 8.7 (L) 04/26/2021 1603  ? PROT 8.4 (H) 04/26/2021 1603  ? ALBUMIN 3.3 (L) 04/26/2021 1603  ? AST 17 04/26/2021 1603  ? ALT 12 04/26/2021 1603  ? ALKPHOS 62 04/26/2021 1603  ? BILITOT 0.2 (L) 04/26/2021 1603  ? GFRNONAA 4 (L) 04/26/2021 1603  ? ? ? ?No results found. ? ?   ?Assessment & Plan:  ? ?1. ESRD (end stage renal disease) ([place]) ?Recommend: ? ?The patient is experiencing increasing problems with their dialysis access. ? ?Patient should have a fistulagram with the intention for intervention.  The patient has a very tortuous  fistula making it difficult for cannulation.  I discussed with patient that fistulogram is necessary to evaluate for any possible jump graft. ? ?The risks, benefits and alternative therapies were reviewed in detail with the patient.  All questions were answered.  The patient agrees to proceed with angio/intervention.   ?  ?- VAS US DUPLEX DIALYSIS ACCESS (AVF,AVG) ? ? ?Current Outpatient Medications on File Prior to Visit  ?Medication Sig Dispense Refill  ? amLODipine (NORVASC) 10 MG tablet Take 1 tablet (10 mg total) by mouth daily. 30 tablet 0  ? calcium acetate (PHOSLO) 667 MG capsule Take 2 capsules (1,334 mg total) by mouth 3 (three) times daily with meals.    ? carvedilol (COREG) 25 MG tablet Take 1 tablet (25 mg total) by mouth 2 (two) times daily with a meal. 60 tablet 1  ? hydrALAZINE (APRESOLINE) 100 MG tablet Take 1 tablet (100 mg total) by mouth every 8 (eight) hours. 90 tablet 1  ? minoxidil (LONITEN) 2.5 MG tablet Take 2 tablets (5 mg total) by mouth daily. 30 tablet 0  ? pantoprazole (PROTONIX) 40 MG tablet Take 1 tablet (40 mg total) by mouth daily. 30 tablet 1  ? dicyclomine (BENTYL) 10 MG capsule Take 1 capsule (10 mg total) by mouth every 6 (six) hours as needed (nausea, emesis). (Patient not taking: Reported on 06/07/2021) 30 capsule 0  ? epoetin alfa (EPOGEN) 10000 UNIT/ML injection Inject 0.4 mLs (4,000 Units total) into the vein every Monday, Wednesday, and Friday with hemodialysis. (Patient not taking: Reported on 06/07/2021) 1 mL   ? ?No current facility-administered medications on file prior to visit.  ? ? ?There are no Patient Instructions on file for this visit. ?No follow-ups on file. ? ? ?Kris Hartmann, NP ? ? ?

## 2021-06-18 NOTE — Progress Notes (Signed)
? ?Subjective:  ? ? Patient ID: Travis Long, male    DOB: [birth year], 36 y.o.   MRN: [id number] ?Chief Complaint  ?Patient presents with  ? Establish Care  ?  Referred by Dr Inocente Salles  HDA/Consult  ? ? ?Travis Long is a 36 year old male that presents today for evaluation of office access he notes that his right radiocephalic AV fistula is difficult to cannulate due to its tortuosity.  He is currently maintained with a PermCath with no issues.  He currently has a flow volume of 1936 with some stenosis noted in the mid area. ? ? ?Review of Systems  ?All other systems reviewed and are negative. ? ?   ?Objective:  ? Physical Exam ?Vitals reviewed.  ?HENT:  ?   Head: Normocephalic.  ?Cardiovascular:  ?   Rate and Rhythm: Normal rate.  ?   Pulses: Normal pulses.  ?Pulmonary:  ?   Effort: Pulmonary effort is normal.  ?Skin: ?   General: Skin is warm and dry.  ?Neurological:  ?   Mental Status: He is alert and oriented to person, place, and time.  ?Psychiatric:     ?   Mood and Affect: Mood normal.     ?   Behavior: Behavior normal.     ?   Thought Content: Thought content normal.     ?   Judgment: Judgment normal.  ? ? ?BP (!) 178/103 (BP Location: Right Arm)   Pulse 94   Resp 17   Ht 5\' 8"  (1.727 m)   Wt 186 lb 12.8 oz (84.7 kg)   BMI 28.40 kg/m?  ? ?Past Medical History:  ?Diagnosis Date  ? ESRD on hemodialysis ([place])   ? Hypertension   ? ? ?Social History  ? ?Socioeconomic History  ? Marital status: Single  ?  Spouse name: Not on file  ? Number of children: Not on file  ? Years of education: Not on file  ? Highest education level: Not on file  ?Occupational History  ? Not on file  ?Tobacco Use  ? Smoking status: Former  ?  Types: Cigarettes  ? Smokeless tobacco: Never  ?Substance and Sexual Activity  ? Alcohol use: Not Currently  ? Drug use: Never  ? Sexual activity: Not on file  ?Other Topics Concern  ? Not on file  ?Social History Narrative  ? Not on file  ? ?Social Determinants of Health  ? ?Financial Resource  Strain: Not on file  ?Food Insecurity: Not on file  ?Transportation Needs: Not on file  ?Physical Activity: Not on file  ?Stress: Not on file  ?Social Connections: Not on file  ?Intimate Partner Violence: Not on file  ? ? ?Past Surgical History:  ?Procedure Laterality Date  ? A/V FISTULAGRAM Left 03/09/2021  ? Procedure: A/V Fistulagram;  Surgeon: Algernon Huxley, MD;  Location: [facility];  Service: Cardiovascular;  Laterality: Left;  ? AV FISTULA PLACEMENT Left   ? DIALYSIS/PERMA CATHETER INSERTION N/A 04/18/2021  ? Procedure: DIALYSIS/PERMA CATHETER INSERTION;  Surgeon: Katha Cabal, MD;  Location: [facility];  Service: Cardiovascular;  Laterality: N/A;  ? IR CATHETER TUBE CHANGE  03/09/2021  ? ? ?Family History  ?Problem Relation Age of Onset  ? Hypertension Mother   ? Diabetes Mellitus II Mother   ? ? ?No Known Allergies ? ? ?  Latest Ref Rng & Units 04/26/2021  ?  4:03 PM 04/25/2021  ?  1:00 PM 04/23/2021  ?  6:47 AM  ?  CBC  ?WBC 4.0 - 10.5 K/uL 10.7   8.3   11.4    ?Hemoglobin 13.0 - 17.0 g/dL 7.1   7.3   7.6    ?Hematocrit 39.0 - 52.0 % 21.2   22.9   23.6    ?Platelets 150 - 400 K/uL 315   314   285    ? ? ? ? ?CMP  ?   ?Component Value Date/Time  ? NA 133 (L) 04/26/2021 1603  ? K 4.7 04/26/2021 1603  ? CL 86 (L) 04/26/2021 1603  ? CO2 32 04/26/2021 1603  ? GLUCOSE 99 04/26/2021 1603  ? BUN 57 (H) 04/26/2021 1603  ? CREATININE 13.86 (H) 04/26/2021 1603  ? CALCIUM 8.7 (L) 04/26/2021 1603  ? PROT 8.4 (H) 04/26/2021 1603  ? ALBUMIN 3.3 (L) 04/26/2021 1603  ? AST 17 04/26/2021 1603  ? ALT 12 04/26/2021 1603  ? ALKPHOS 62 04/26/2021 1603  ? BILITOT 0.2 (L) 04/26/2021 1603  ? GFRNONAA 4 (L) 04/26/2021 1603  ? ? ? ?No results found. ? ?   ?Assessment & Plan:  ? ?1. ESRD (end stage renal disease) ([place]) ?Recommend: ? ?The patient is experiencing increasing problems with their dialysis access. ? ?Patient should have a fistulagram with the intention for intervention.  The patient has a very tortuous  fistula making it difficult for cannulation.  I discussed with patient that fistulogram is necessary to evaluate for any possible jump graft. ? ?The risks, benefits and alternative therapies were reviewed in detail with the patient.  All questions were answered.  The patient agrees to proceed with angio/intervention.   ?  ?- VAS US DUPLEX DIALYSIS ACCESS (AVF,AVG) ? ? ?Current Outpatient Medications on File Prior to Visit  ?Medication Sig Dispense Refill  ? amLODipine (NORVASC) 10 MG tablet Take 1 tablet (10 mg total) by mouth daily. 30 tablet 0  ? calcium acetate (PHOSLO) 667 MG capsule Take 2 capsules (1,334 mg total) by mouth 3 (three) times daily with meals.    ? carvedilol (COREG) 25 MG tablet Take 1 tablet (25 mg total) by mouth 2 (two) times daily with a meal. 60 tablet 1  ? hydrALAZINE (APRESOLINE) 100 MG tablet Take 1 tablet (100 mg total) by mouth every 8 (eight) hours. 90 tablet 1  ? minoxidil (LONITEN) 2.5 MG tablet Take 2 tablets (5 mg total) by mouth daily. 30 tablet 0  ? pantoprazole (PROTONIX) 40 MG tablet Take 1 tablet (40 mg total) by mouth daily. 30 tablet 1  ? dicyclomine (BENTYL) 10 MG capsule Take 1 capsule (10 mg total) by mouth every 6 (six) hours as needed (nausea, emesis). (Patient not taking: Reported on 06/07/2021) 30 capsule 0  ? epoetin alfa (EPOGEN) 10000 UNIT/ML injection Inject 0.4 mLs (4,000 Units total) into the vein every Monday, Wednesday, and Friday with hemodialysis. (Patient not taking: Reported on 06/07/2021) 1 mL   ? ?No current facility-administered medications on file prior to visit.  ? ? ?There are no Patient Instructions on file for this visit. ?No follow-ups on file. ? ? ?Kris Hartmann, NP ? ? ?

## 2021-06-21 ENCOUNTER — Telehealth (INDEPENDENT_AMBULATORY_CARE_PROVIDER_SITE_OTHER): Payer: Self-pay

## 2021-06-21 NOTE — Telephone Encounter (Signed)
Patient called to reschedule his right arm fistulagram with Dr. Lucky Cowboy on 06/22/21. Patient was offered 06/29/21 and declined, patient was offered 07/06/21 and accepted. Patient has been rescheduled to 07/06/21 with a 7:30 am arrival time to the [facility]. Pre-procedure instructions will be mailed. ?

## 2021-06-22 DIAGNOSIS — N186 End stage renal disease: Secondary | ICD-10-CM

## 2021-07-06 ENCOUNTER — Encounter: Payer: Self-pay | Admitting: Vascular Surgery

## 2021-07-06 ENCOUNTER — Other Ambulatory Visit: Payer: Self-pay

## 2021-07-06 ENCOUNTER — Encounter
Admission: RE | Disposition: A | Discharge status: Home / Self Care | Payer: Self-pay | Source: Home / Self Care | Attending: Vascular Surgery

## 2021-07-06 ENCOUNTER — Ambulatory Visit
Admission: RE | Admit: 2021-07-06 | Discharge: 2021-07-06 | Disposition: A | Discharge status: Home / Self Care | Payer: Medicare Other | Attending: Vascular Surgery | Admitting: Vascular Surgery

## 2021-07-06 DIAGNOSIS — Y841 Kidney dialysis as the cause of abnormal reaction of the patient, or of later complication, without mention of misadventure at the time of the procedure: Secondary | ICD-10-CM | POA: Insufficient documentation

## 2021-07-06 DIAGNOSIS — Z992 Dependence on renal dialysis: Secondary | ICD-10-CM

## 2021-07-06 DIAGNOSIS — N186 End stage renal disease: Secondary | ICD-10-CM | POA: Diagnosis not present

## 2021-07-06 DIAGNOSIS — T82898A Other specified complication of vascular prosthetic devices, implants and grafts, initial encounter: Secondary | ICD-10-CM | POA: Diagnosis present

## 2021-07-06 DIAGNOSIS — I120 Hypertensive chronic kidney disease with stage 5 chronic kidney disease or end stage renal disease: Secondary | ICD-10-CM | POA: Insufficient documentation

## 2021-07-06 DIAGNOSIS — T82858A Stenosis of vascular prosthetic devices, implants and grafts, initial encounter: Secondary | ICD-10-CM | POA: Diagnosis not present

## 2021-07-06 HISTORY — PX: A/V FISTULAGRAM: CATH118298

## 2021-07-06 LAB — POTASSIUM (ARMC VASCULAR LAB ONLY): Potassium (ARMC vascular lab): 5.2 mmol/L — ABNORMAL HIGH (ref 3.5–5.1)

## 2021-07-06 SURGERY — A/V FISTULAGRAM
Anesthesia: Moderate Sedation | Laterality: Left

## 2021-07-06 MED ORDER — FENTANYL CITRATE (PF) 100 MCG/2ML IJ SOLN
INTRAMUSCULAR | Status: DC | PRN
  Administered 2021-07-06: 50 ug via INTRAVENOUS

## 2021-07-06 MED ORDER — CEFAZOLIN SODIUM-DEXTROSE 1-4 GM/50ML-% IV SOLN: 1.0000 g | Freq: Once | INTRAVENOUS | Status: DC

## 2021-07-06 MED ORDER — FAMOTIDINE 20 MG PO TABS: 40.0000 mg | ORAL_TABLET | Freq: Once | ORAL | Status: DC | PRN

## 2021-07-06 MED ORDER — MIDAZOLAM HCL 2 MG/2ML IJ SOLN
INTRAMUSCULAR | Status: AC
  Filled 2021-07-06: qty 4

## 2021-07-06 MED ORDER — HYDROMORPHONE HCL 1 MG/ML IJ SOLN: 1.0000 mg | Freq: Once | INTRAMUSCULAR | Status: DC | PRN

## 2021-07-06 MED ORDER — METHYLPREDNISOLONE SODIUM SUCC 125 MG IJ SOLR: 125.0000 mg | Freq: Once | INTRAMUSCULAR | Status: DC | PRN

## 2021-07-06 MED ORDER — ONDANSETRON HCL 4 MG/2ML IJ SOLN: 4.0000 mg | Freq: Four times a day (QID) | INTRAMUSCULAR | Status: DC | PRN

## 2021-07-06 MED ORDER — HEPARIN SODIUM (PORCINE) 1000 UNIT/ML IJ SOLN
INTRAMUSCULAR | Status: AC
  Filled 2021-07-06: qty 10

## 2021-07-06 MED ORDER — DIPHENHYDRAMINE HCL 50 MG/ML IJ SOLN: 50.0000 mg | Freq: Once | INTRAMUSCULAR | Status: DC | PRN

## 2021-07-06 MED ORDER — IODIXANOL 320 MG/ML IV SOLN
INTRAVENOUS | Status: DC | PRN
  Administered 2021-07-06: 15 mL

## 2021-07-06 MED ORDER — FENTANYL CITRATE (PF) 100 MCG/2ML IJ SOLN
INTRAMUSCULAR | Status: AC
  Filled 2021-07-06: qty 2

## 2021-07-06 MED ORDER — SODIUM CHLORIDE 0.9 % IV SOLN: INTRAVENOUS | Status: DC

## 2021-07-06 MED ORDER — MIDAZOLAM HCL 2 MG/ML PO SYRP: 8.0000 mg | ORAL_SOLUTION | Freq: Once | ORAL | Status: DC | PRN

## 2021-07-06 MED ORDER — MIDAZOLAM HCL 2 MG/2ML IJ SOLN
INTRAMUSCULAR | Status: DC | PRN
  Administered 2021-07-06: 2 mg via INTRAVENOUS

## 2021-07-06 MED ORDER — CEFAZOLIN SODIUM-DEXTROSE 1-4 GM/50ML-% IV SOLN
INTRAVENOUS | Status: AC
  Filled 2021-07-06: qty 50

## 2021-07-06 SURGICAL SUPPLY — 7 items
CANNULA 5F STIFF (CANNULA) ×1 IMPLANT
COVER PROBE U/S 5X48 (MISCELLANEOUS) ×1 IMPLANT
DRAPE BRACHIAL (DRAPES) ×1 IMPLANT
PACK ANGIOGRAPHY (CUSTOM PROCEDURE TRAY) ×2 IMPLANT
SHEATH BRITE TIP 6FRX5.5 (SHEATH) ×1 IMPLANT
SUT MNCRL AB 4-0 PS2 18 (SUTURE) ×1 IMPLANT
TOWEL OR 17X26 4PK STRL BLUE (TOWEL DISPOSABLE) ×1 IMPLANT

## 2021-07-06 NOTE — Interval H&P Note (Signed)
History and Physical Interval Note: ? ?07/06/2021 ?8:12 AM ? ?Travis Long  has presented today for surgery, with the diagnosis of Right Fistulagram    End Stage Renal.  The various methods of treatment have been discussed with the patient and family. After consideration of risks, benefits and other options for treatment, the patient has consented to  Procedure(s): ?A/V Fistulagram (Left) as a surgical intervention.  The patient's history has been reviewed, patient examined, no change in status, stable for surgery.  I have reviewed the patient's chart and labs.  Questions were answered to the patient's satisfaction.   ? ? ?[name] ? ? ?

## 2021-07-06 NOTE — Progress Notes (Signed)
Recovery complete. Awaiting pt's ride ?

## 2021-07-06 NOTE — Op Note (Signed)
[facility] ? ? ? ?OPERATIVE NOTE ? ? ?PROCEDURE: ?1.   Left radiocephalic arteriovenous fistula cannulation under ultrasound guidance ?2.   Left arm fistulagram including central venogram ? ? ?PRE-OPERATIVE DIAGNOSIS: 1. ESRD ?2. Poorly functional markedly tortuous left radiocephalic AVF ? ?POST-OPERATIVE DIAGNOSIS: same as above  ? ?SURGEON: [name], MD ? ?ANESTHESIA: local with MCS ? ?ESTIMATED BLOOD LOSS: 3 cc ? ?FINDING(S): ?Forearm cephalic vein is markedly tortuous with some areas of mild to moderate narrowing but nothing high-grade.  There is dual outflow in the upper arm at the antecubital fossa from both the cephalic vein and basilic vein which are both widely patent.  These have no focal stenosis and drain into the central venous circulation which is also widely patent. ? ?SPECIMEN(S):  None ? ?CONTRAST: 15 cc ? ?FLUORO TIME: 0.2 minutes ? ?MODERATE CONSCIOUS SEDATION TIME: Approximately 15 minutes with 2 mg of Versed and 50 mcg of Fentanyl  ? ?INDICATIONS: ?Travis Long is a 36 y.o. male who presents with malfunctioning left radiocephalic arteriovenous fistula.  The patient is scheduled for left arm fistulagram.  The patient is aware the risks include but are not limited to: bleeding, infection, thrombosis of the cannulated access, and possible anaphylactic reaction to the contrast.  The patient is aware of the risks of the procedure and elects to proceed forward. ? ?DESCRIPTION: ?After full informed written consent was obtained, the patient was brought back to the angiography suite and placed supine upon the angiography table.  The patient was connected to monitoring equipment. Moderate conscious sedation was administered with a face to face encounter with the patient throughout the procedure with my supervision of the RN administering medicines and monitoring the patient's vital signs and mental status throughout from the start of the procedure until the patient was taken to  the recovery room. The left arm was prepped and draped in the standard fashion for a percutaneous access intervention.  Under ultrasound guidance, the left radiocephalic arteriovenous fistula was cannulated with a micropuncture needle under direct ultrasound guidance where it was patent and a permanent image was performed.  The microwire was advanced into the fistula and the needle was exchanged for the a microsheath.  I then upsized to a 6 Fr Sheath and imaging was performed.  Hand injections were completed to image the access including the central venous system. This demonstrated forearm cephalic vein is markedly tortuous with some areas of mild to moderate narrowing but nothing high-grade.  There is dual outflow in the upper arm at the antecubital fossa from both the cephalic vein and basilic vein which are both widely patent.  These have no focal stenosis and drain into the central venous circulation which is also widely patent.  Based on the images, this patient will need surgical revision most simply done likely with a jump graft to either the basilic vein or the cephalic vein for outflow in the upper arm. ? ? ?Based on the completion imaging, no further intervention is necessary.  The wire and balloon were removed from the sheath.  A 4-0 Monocryl purse-string suture was sewn around the sheath.  The sheath was removed while tying down the suture.  A sterile bandage was applied to the puncture site. ? ?COMPLICATIONS: None ? ?CONDITION: Stable ? ? ?[name] ? ?07/06/2021 8:29 AM ? ? ?This note was created with Dragon Medical transcription system. Any errors in dictation are purely unintentional.  ?

## 2021-07-07 ENCOUNTER — Telehealth (INDEPENDENT_AMBULATORY_CARE_PROVIDER_SITE_OTHER): Payer: Self-pay

## 2021-07-07 NOTE — Telephone Encounter (Signed)
Spoke with the patient's mother and he is scheduled with Dr. Lucky Cowboy for a left forearm jump graft revision of AVF on 07/20/21 at the [facility]. Pre-op phone call is on 07/11/21 between 1-5 pm, using the [phone] number that was given to pre-op to call. Pre-surgical instructions were discussed and will be mailed. ?

## 2021-07-11 ENCOUNTER — Inpatient Hospital Stay
Admission: RE | Admit: 2021-07-11 | Discharge: 2021-07-11 | Disposition: A | Discharge status: Home / Self Care | Payer: Medicare Other | Source: Ambulatory Visit

## 2021-07-11 ENCOUNTER — Other Ambulatory Visit (INDEPENDENT_AMBULATORY_CARE_PROVIDER_SITE_OTHER): Payer: Self-pay | Admitting: Nurse Practitioner

## 2021-07-11 DIAGNOSIS — N186 End stage renal disease: Secondary | ICD-10-CM

## 2021-07-11 NOTE — Progress Notes (Signed)
Patient does not want to continue his phone interview because he is trying to decide if he wants to have Peritoneal Dialysis versus Hemodialysis because he has to drive too far to do the dialysis.I will cancel this appointment and wait for Dr. Bunnie Domino office to confirm what is being done. ?

## 2021-07-11 NOTE — Patient Instructions (Signed)
Your procedure is scheduled on: Thursday 07/20/21 ?Report to the Registration Desk on the 1st floor of the [facility]. ?To find out your arrival time, please call [phone] between 1PM - 3PM on: Wednesday 07/19/21 ? ?REMEMBER: ?Instructions that are not followed completely may result in serious medical risk, up to and including death; or upon the discretion of your surgeon and anesthesiologist your surgery may need to be rescheduled. ? ?Do not eat or drink after midnight the night before surgery.  ?No gum chewing, lozengers or hard candies. ? ?TAKE THESE MEDICATIONS THE MORNING OF SURGERY WITH A SIP OF WATER: ?amLODipine (NORVASC) 10 MG tablet ?carvedilol (COREG) 25 MG tablet ?hydrALAZINE (APRESOLINE) 100 MG tablet ?minoxidil (LONITEN) 2.5 MG tablet ? ?pantoprazole (PROTONIX) 40 MG tablet (take one the night before and one on the morning of surgery - helps to prevent nausea after surgery.) ? ?One week prior to surgery: ?Stop Anti-inflammatories (NSAIDS) such as Advil, Aleve, Ibuprofen, Motrin, Naproxen, Naprosyn and Aspirin based products such as Excedrin, Goodys Powder, BC Powder. ? ?Stop ANY OVER THE COUNTER supplements until after surgery. ? ?You may however, continue to take Tylenol if needed for pain up until the day of surgery. ? ?No Alcohol for 24 hours before or after surgery. ? ?No Smoking including e-cigarettes for 24 hours prior to surgery.  ?No chewable tobacco products for at least 6 hours prior to surgery.  ?No nicotine patches on the day of surgery. ? ?Do not use any "recreational" drugs for at least a week prior to your surgery.  ?Please be advised that the combination of cocaine and anesthesia may have negative outcomes, up to and including death. ?If you test positive for cocaine, your surgery will be cancelled. ? ?On the morning of surgery brush your teeth with toothpaste and water, you may rinse your mouth with mouthwash if you wish. ?Do not swallow any toothpaste or mouthwash. ? ?Use CHG  wipes as directed on instruction sheet. ? ?Do not wear jewelry. ? ?Do not wear lotions, powders, or colognes.  ? ?Do not shave body from the neck down 48 hours prior to surgery just in case you cut yourself which could leave a site for infection.  ?Also, freshly shaved skin may become irritated if using the CHG soap. ? ?Contact lenses, hearing aids and dentures may not be worn into surgery. ? ?Do not bring valuables to the hospital. [facility] is not responsible for any missing/lost belongings or valuables.  ? ?Notify your doctor if there is any change in your medical condition (cold, fever, infection). ? ?Wear comfortable clothing (specific to your surgery type) to the hospital. ? ?If you are being discharged the day of surgery, you will not be allowed to drive home. ?You will need a responsible adult (18 years or older) to drive you home and stay with you that night.  ? ?If you are taking public transportation, you will need to have a responsible adult (18 years or older) with you. ?Please confirm with your physician that it is acceptable to use public transportation.  ? ?Please call the [facility] at [phone] if you have any questions about these instructions. ? ?Surgery Visitation Policy: ? ?Patients undergoing a surgery or procedure may have two family members or support persons with them as long as the person is not COVID-19 positive or experiencing its symptoms.  ? ?Inpatient Visitation:   ? ?Visiting hours are 7 a.m. to 8 p.m. ?Up to four visitors are allowed at one  time in a patient room, including children. The visitors may rotate out with other people during the day. One designated support person (adult) may remain overnight.  ?

## 2021-07-13 ENCOUNTER — Inpatient Hospital Stay: Admission: RE | Admit: 2021-07-13 | Payer: Medicare Other | Source: Ambulatory Visit

## 2021-07-17 ENCOUNTER — Inpatient Hospital Stay: Admission: RE | Admit: 2021-07-17 | Payer: Medicare Other | Source: Ambulatory Visit

## 2021-07-17 HISTORY — DX: Tobacco use: Z72.0

## 2021-07-17 HISTORY — DX: Pleural effusion, not elsewhere classified: J90

## 2021-07-17 HISTORY — DX: Dependence on supplemental oxygen: Z99.81

## 2021-07-17 HISTORY — DX: Dependence on renal dialysis: Z99.2

## 2021-07-17 HISTORY — DX: Dyspnea, unspecified: R06.00

## 2021-07-17 HISTORY — DX: Pneumonia, unspecified organism: J18.9

## 2021-07-17 HISTORY — DX: Acute respiratory failure with hypoxia: J96.01

## 2021-07-17 HISTORY — DX: Anemia, unspecified: D64.9

## 2021-07-17 HISTORY — DX: Encephalopathy, unspecified: G93.40

## 2021-07-17 HISTORY — DX: Acute pulmonary edema: J81.0

## 2021-07-18 ENCOUNTER — Inpatient Hospital Stay
Admission: RE | Admit: 2021-07-18 | Discharge: 2021-07-18 | Disposition: A | Discharge status: Home / Self Care | Payer: Medicare Other | Source: Ambulatory Visit

## 2021-07-18 NOTE — Pre-Procedure Instructions (Signed)
Called numbers available on chart for pre op phone interview, no answer.Travis Long

## 2021-07-19 ENCOUNTER — Telehealth (INDEPENDENT_AMBULATORY_CARE_PROVIDER_SITE_OTHER): Payer: Self-pay

## 2021-07-19 MED ORDER — CEFAZOLIN SODIUM-DEXTROSE 2-4 GM/100ML-% IV SOLN: 2.0000 g | INTRAVENOUS | Status: DC

## 2021-07-19 MED ORDER — CHLORHEXIDINE GLUCONATE CLOTH 2 % EX PADS
6.0000 | MEDICATED_PAD | Freq: Once | CUTANEOUS | Status: DC
Start: 2021-07-19 — End: 2021-07-20

## 2021-07-19 MED ORDER — CHLORHEXIDINE GLUCONATE CLOTH 2 % EX PADS: 6.0000 | MEDICATED_PAD | Freq: Once | CUTANEOUS | Status: DC

## 2021-07-19 NOTE — Telephone Encounter (Signed)
I received a call from pre-admit stating the patient when called for his phone call pre-op stated he did not want to do this surgery he wanted to have a PD cath surgery for dialysis at home. Patient is scheduled for a revision of a AV fistula with Dr. Lucky Cowboy on 07/20/21. Patient has declined to answer the phone after pre-admission attempted to contact again and patient is not answering the phone. Patient home number was called as well as the stepfather and mother's cell number. No answer at one and the other the voicemail box is full. ?

## 2021-07-20 ENCOUNTER — Encounter: Admission: RE | Payer: Self-pay | Source: Ambulatory Visit

## 2021-07-20 ENCOUNTER — Telehealth (INDEPENDENT_AMBULATORY_CARE_PROVIDER_SITE_OTHER): Payer: Self-pay

## 2021-07-20 ENCOUNTER — Ambulatory Visit: Admission: RE | Admit: 2021-07-20 | Payer: Medicare Other | Source: Ambulatory Visit | Admitting: Vascular Surgery

## 2021-07-20 SURGERY — REVISON OF ARTERIOVENOUS FISTULA
Anesthesia: General | Laterality: Left

## 2021-07-20 NOTE — Telephone Encounter (Signed)
Spoke with the patient's mother and per her, she states the patient decided he wanted a different way to dialysis and did not want to have a fistula placed. Patient's surgery has been canceled. ?

## 2021-07-21 ENCOUNTER — Ambulatory Visit (INDEPENDENT_AMBULATORY_CARE_PROVIDER_SITE_OTHER): Payer: Medicare Other | Admitting: Surgery

## 2021-07-21 ENCOUNTER — Encounter: Payer: Self-pay | Admitting: Surgery

## 2021-07-21 VITALS — BP 154/94 | HR 90 | Temp 98.6°F | Ht 68.0 in | Wt 185.6 lb

## 2021-07-21 DIAGNOSIS — Z992 Dependence on renal dialysis: Secondary | ICD-10-CM

## 2021-07-21 DIAGNOSIS — N186 End stage renal disease: Secondary | ICD-10-CM | POA: Diagnosis not present

## 2021-07-21 NOTE — Patient Instructions (Signed)
Our surgery scheduler Barbara will call you within 24-48 hours to get you scheduled. If you have not heard from her after 48 hours, please call our office. Have the blue sheet available when she calls to write down important information.  If you have any concerns or questions, please feel free to call our office.   Peritoneal Dialysis Catheter Placement  Peritoneal dialysis catheter placement is a surgery to insert a thin, flexible tube (catheter) into the abdomen. The catheter will be used for peritoneal dialysis, which is a process for filtering the blood. The catheter is small, soft, and easy to conceal. The catheter placement is usually done at least 2 weeks before peritoneal dialysis is started. During dialysis, wastes, salt, and extra water are removed from the blood. In peritoneal dialysis, these tasks are performed by transferring a fluid (dialysate) to and from the abdomen during each session. The fluid goes through the catheter to enter the abdomen at the start of each dialysis session, and it drains out of the body through the catheter at the end of each session. This procedure is done using one of the following techniques: Open technique. This is when the surgery is performed through one large incision. Laparoscopic technique. This is when smaller incisions are made and a tube with a light and camera (laparoscope) is inserted through one of the incisions to help perform the surgery. The camera sends images to a video screen in the operating room. This lets the surgeon see inside the abdomen during the procedure. Tell a health care provider about: Any allergies you have. All medicines you are taking, including vitamins, herbs, eye drops, creams, and over-the-counter medicines. Any problems you or family members have had with anesthetic medicines. Any blood disorders you have. Any surgeries you have had. Any history of smoking. Any medical conditions you have. Whether you are pregnant or  may be pregnant. What are the risks? Generally, this is a safe procedure. However, problems may occur, including: Infection. Too much bleeding. A collection of blood near the incision (hematoma). Damage to blood vessels, tissues, or organs in the abdomen area. Allergic reactions to medicines. Pain or cramping. Slow healing. Catheter problems after the surgery. The catheter may: Become blocked. Move out of place. Poke or wrap around intestines. Allow fluid to leak around it. Scarring. Skin damage. What happens before the procedure? Staying hydrated Follow instructions from your health care provider about hydration, which may include: Up to 2 hours before the procedure - you may continue to drink clear liquids, such as water, clear fruit juice, black coffee, and plain tea.  Eating and drinking restrictions Follow instructions from your health care provider about eating and drinking, which may include: 8 hours before the procedure - stop eating heavy meals or foods, such as meat, fried foods, or fatty foods. 6 hours before the procedure - stop eating light meals or foods, such as toast or cereal. 6 hours before the procedure - stop drinking milk or drinks that contain milk. 2 hours before the procedure - stop drinking clear liquids. Medicines Ask your health care provider about: Changing or stopping your regular medicines. This is especially important if you are taking diabetes medicines or blood thinners. Taking medicines such as aspirin and ibuprofen. These medicines can thin your blood. Do not take these medicines unless your health care provider tells you to take them. Taking over-the-counter medicines, vitamins, herbs, and supplements. Surgery safety Ask your health care provider: How your surgery site will be marked. What   steps will be taken to help prevent infection. These steps may include: Removing hair at the surgery site. Washing skin with a germ-killing soap. Taking  antibiotic medicine. General instructions You may have a CT scan or ultrasound of your abdomen. You may have a blood sample taken. Plan to have a responsible adult take you home from the hospital or clinic. Your health care provider will discuss the best site for the catheter to be placed. The site will be chosen: To help prevent the catheter from being flattened or damaged. To make it as comfortable as possible for you. What happens during the procedure? An IV will be inserted into one of your veins. You will be given one or both of the following: A medicine to help you relax (sedative). A medicine to numb the area (local anesthetic). If you are having an open surgery, one large incision will be made in the abdomen. If you are having laparoscopic surgery, small incisions will be made in the abdomen. A laparoscope and instruments will be put through the incisions. The catheter will be put in place. A short tunnel will be made under the skin to a location where the catheter exits the abdomen. Stitches (sutures) will be placed around the catheter to hold it in place. Your incisions will be closed with sutures or staples. The procedure may vary among health care providers and hospitals. What happens after the procedure? Your blood pressure, heart rate, breathing rate, and blood oxygen level will be monitored until you leave the hospital or clinic. You may have some pain. You will be given pain medicine as needed. You will be given instructions about how to care for your catheter and how it is used for the dialysis process. Summary Peritoneal dialysis catheter placement is a surgery to insert a thin, flexible tube (catheter) in your abdomen. This surgery must be done before you begin peritoneal dialysis. Before the procedure, your health care provider will discuss the best site for the catheter to be placed. The site will be chosen to help prevent the catheter from being flattened or damaged,  and to make it as comfortable as possible for you. After the procedure, you will be given instructions about how to care for your catheter and how it is used for the dialysis process. This information is not intended to replace advice given to you by your health care provider. Make sure you discuss any questions you have with your health care provider. Document Revised: 10/29/2019 Document Reviewed: 10/29/2019 Elsevier Patient Education  2023 Elsevier Inc.  

## 2021-07-21 NOTE — Progress Notes (Signed)
?07/21/2021 ? ?Reason for Visit:  End stage renal disease ? ?Requesting Provider:  Zonia Kief, PA-C ? ?History of Present Illness: ?Travis Long is a 36 y.o. male presenting for evaluation of peritoneal dialysis catheter placement.  Patient has a history of end-stage renal disease and had a left radiocephalic AV fistula placed on 10/14/2019 at [facility] for end-stage renal disease.  The patient has had issues with access to the fistula and he had a fistulogram on 03/09/2021 and on 07/06/2021 with Dr. Lucky Cowboy which showed very tortuous forearm cephalic vein portion of the fistula.  He currently has a tunneled dialysis catheter in the right IJ which was placed on 04/18/2021 and undergoes dialysis on a Monday/Wednesday/Friday schedule through this catheter.  He is seeking a different form of dialysis and presents today for evaluation for peritoneal dialysis.  He lives in [place] but comes to [place] for dialysis.  He is looking for an option where he can do this at home or wherever he tries to travel. ? ?Currently denies any abdominal pain, denies any abdominal surgeries in the past, and denies any bulging sensation at the umbilicus or either groin.  He does have a history of bilateral pleural effusions and has had chest tubes in the past.  Recently, these effusions have become more loculated and are not amenable for thoracentesis.  He has been evaluated by thoracic surgery but was deemed not a good candidate due to some history of medical noncompliance.  He does use oxygen at home and typically uses 3 L at night and also when he is doing activities.  He does not have a pulmonologist, and he reports that he is currently seeking a primary care provider. ? ?Past Medical History: ?Past Medical History:  ?Diagnosis Date  ? Acute pulmonary edema (HCC)   ? Acute respiratory failure with hypoxia ([place])   ? Anemia   ? CAP (community acquired pneumonia)   ? Dialysis patient [facility])   ? Dyspnea   ? Encephalopathy    ? ESRD on hemodialysis ([place])   ? Hypertension   ? Pleural effusion   ? Requires supplemental oxygen   ? Tobacco use   ?  ? ?Past Surgical History: ?Past Surgical History:  ?Procedure Laterality Date  ? A/V FISTULAGRAM Left 03/09/2021  ? Procedure: A/V Fistulagram;  Surgeon: Algernon Huxley, MD;  Location: [facility] [facility];  Service: Cardiovascular;  Laterality: Left;  ? A/V FISTULAGRAM Left 07/06/2021  ? Procedure: A/V Fistulagram;  Surgeon: Algernon Huxley, MD;  Location: [place] [facility];  Service: Cardiovascular;  Laterality: Left;  ? AV FISTULA PLACEMENT Left   ? DIALYSIS/PERMA CATHETER INSERTION N/A 04/18/2021  ? Procedure: DIALYSIS/PERMA CATHETER INSERTION;  Surgeon: Katha Cabal, MD;  Location: [place] [facility];  Service: Cardiovascular;  Laterality: N/A;  ? IR CATHETER TUBE CHANGE  03/09/2021  ? ? ?Home Medications: ?Prior to Admission medications   ?Medication Sig Start Date End Date Taking? Authorizing Provider  ?amLODipine (NORVASC) 10 MG tablet Take 1 tablet (10 mg total) by mouth daily. 12/02/20  Yes Sharen Hones, MD  ?calcium acetate (PHOSLO) 667 MG capsule Take 2 capsules (1,334 mg total) by mouth 3 (three) times daily with meals. 03/10/21  Yes Nicole Kindred A, DO  ?carvedilol (COREG) 25 MG tablet Take 1 tablet (25 mg total) by mouth 2 (two) times daily with a meal. 03/15/21  Yes Mercy Riding, MD  ?dicyclomine (BENTYL) 10 MG capsule Take 1 capsule (10 mg total) by mouth  every 6 (six) hours as needed (nausea, emesis). 03/15/21  Yes Mercy Riding, MD  ?epoetin alfa (EPOGEN) 10000 UNIT/ML injection Inject 0.4 mLs (4,000 Units total) into the vein every Monday, Wednesday, and Friday with hemodialysis. 03/13/21  Yes Nicole Kindred A, DO  ?hydrALAZINE (APRESOLINE) 100 MG tablet Take 1 tablet (100 mg total) by mouth every 8 (eight) hours. 03/15/21  Yes Mercy Riding, MD  ?minoxidil (LONITEN) 2.5 MG tablet Take 2 tablets (5 mg total) by mouth daily. 12/03/20  Yes Sharen Hones, MD  ?pantoprazole  (PROTONIX) 40 MG tablet Take 1 tablet (40 mg total) by mouth daily. 03/15/21  Yes Mercy Riding, MD  ? ? ?Allergies: ?No Known Allergies ? ?Social History: ? reports that he has quit smoking. His smoking use included cigarettes. He has never used smokeless tobacco. He reports that he does not currently use alcohol. He reports that he does not use drugs.  ? ?Family History: ?Family History  ?Problem Relation Age of Onset  ? Hypertension Mother   ? Diabetes Mellitus II Mother   ? ? ?Review of Systems: ?Review of Systems  ?Constitutional:  Negative for chills and fever.  ?HENT:  Negative for hearing loss.   ?Respiratory:  Positive for shortness of breath.   ?Cardiovascular:  Negative for chest pain.  ?Gastrointestinal:  Negative for abdominal pain, nausea and vomiting.  ?Genitourinary:  Negative for dysuria.  ?Musculoskeletal:  Negative for myalgias.  ?Skin:  Negative for rash.  ?Neurological:  Negative for dizziness.  ?Psychiatric/Behavioral:  Negative for depression.   ? ?Physical Exam ?BP (!) 154/94   Pulse 90   Temp 98.6 ?F (37 ?C) (Oral)   Ht 5\' 8"  (1.727 m)   Wt 185 lb 9.6 oz (84.2 kg)   SpO2 99%   BMI 28.22 kg/m?  ?CONSTITUTIONAL: No acute distress, well-nourished ?HEENT:  Normocephalic, atraumatic, extraocular motion intact. ?NECK: Trachea is midline, and there is no jugular venous distension.  ?RESPIRATORY: Diminished breath sounds bilaterally.  Normal respiratory effort while wearing nasal cannula without pathologic use of accessory muscles. ?CARDIOVASCULAR: Regular rhythm and rate. ?GI: The abdomen is soft, nondistended, nontender.  No evidence of any hernias.  ?MUSCULOSKELETAL:  Normal muscle strength and tone in all four extremities.  No peripheral edema or cyanosis. ?SKIN: Skin turgor is normal. There are no pathologic skin lesions.  ?NEUROLOGIC:  Motor and sensation is grossly normal.  Cranial nerves are grossly intact. ?PSYCH:  Alert and oriented to person, place and time. Affect is  normal. ? ?Laboratory Analysis: ?Labs from 04/26/2021: ?Sodium 133, potassium 4.7, chloride 86, CO2 32, BUN 57, creatinine 13.86.  Total bilirubin 0.2, AST 17, ALT 12, alkaline phosphatase 62.  WBC 10.7, hemoglobin 7.1, hematocrit 21.2, platelets 315. ? ?Imaging: ?Chest x-ray on 04/26/2021: ?IMPRESSION: ?Stable bilateral effusions and parenchymal consolidation/atelectasis, left worse than right. ? ?Assessment and Plan: ?This is a 36 y.o. male with end-stage renal disease currently using a tunneled right IJ dialysis catheter. ? ?- Discussed with the patient that a peritoneal dialysis catheter will be helpful with his goals of being more mobile and being able to travel without needing to find a dialysis center.  Discussed with him that there are pros and cons to each method of dialysis he is still interested in pursuing peritoneal dialysis.  Discussed with him the plan for a laparoscopic assisted peritoneal dialysis catheter placement and reviewed with him the surgery at length including the incisions, that this an outpatient procedure, the risks of bleeding, infection, injury to surrounding  structures, and he is willing to proceed. ?- My main concern is with regards to his pleural effusions and his oxygen need.  He does not have a pulmonologist and is currently seeking a primary care provider.  As such, I will ask for anesthesiology team to consult on him to see if he would be an appropriate candidate for general anesthesia as he will require endotracheal intubation for a laparoscopic approach.  If he is not deemed a good candidate for this, then unfortunately would not be able to place a peritoneal dialysis catheter he will have to continue with hemodialysis instead.  Patient understanding of this. ?- Tentatively, we will schedule him for surgery on 07/27/2021 pending anesthesia consultation. ? ?I spent 80 minutes dedicated to the care of this patient on the date of this encounter to include pre-visit review of records,  face-to-face time with the patient discussing diagnosis and management, and any post-visit coordination of care. ? ? ?Melvyn Neth, MD ?[facility] ? ?  ?

## 2021-07-21 NOTE — H&P (View-Only) (Signed)
?07/21/2021 ? ?Reason for Visit:  End stage renal disease ? ?Requesting Provider:  Zonia Kief, PA-C ? ?History of Present Illness: ?Travis Long is a 36 y.o. male presenting for evaluation of peritoneal dialysis catheter placement.  Patient has a history of end-stage renal disease and had a left radiocephalic AV fistula placed on 10/14/2019 at [facility] for end-stage renal disease.  The patient has had issues with access to the fistula and he had a fistulogram on 03/09/2021 and on 07/06/2021 with Dr. Lucky Cowboy which showed very tortuous forearm cephalic vein portion of the fistula.  He currently has a tunneled dialysis catheter in the right IJ which was placed on 04/18/2021 and undergoes dialysis on a Monday/Wednesday/Friday schedule through this catheter.  He is seeking a different form of dialysis and presents today for evaluation for peritoneal dialysis.  He lives in [place] but comes to [place] for dialysis.  He is looking for an option where he can do this at home or wherever he tries to travel. ? ?Currently denies any abdominal pain, denies any abdominal surgeries in the past, and denies any bulging sensation at the umbilicus or either groin.  He does have a history of bilateral pleural effusions and has had chest tubes in the past.  Recently, these effusions have become more loculated and are not amenable for thoracentesis.  He has been evaluated by thoracic surgery but was deemed not a good candidate due to some history of medical noncompliance.  He does use oxygen at home and typically uses 3 L at night and also when he is doing activities.  He does not have a pulmonologist, and he reports that he is currently seeking a primary care provider. ? ?Past Medical History: ?Past Medical History:  ?Diagnosis Date  ? Acute pulmonary edema (HCC)   ? Acute respiratory failure with hypoxia ([place])   ? Anemia   ? CAP (community acquired pneumonia)   ? Dialysis patient [facility])   ? Dyspnea   ? Encephalopathy    ? ESRD on hemodialysis ([place])   ? Hypertension   ? Pleural effusion   ? Requires supplemental oxygen   ? Tobacco use   ?  ? ?Past Surgical History: ?Past Surgical History:  ?Procedure Laterality Date  ? A/V FISTULAGRAM Left 03/09/2021  ? Procedure: A/V Fistulagram;  Surgeon: Algernon Huxley, MD;  Location: [place] [facility];  Service: Cardiovascular;  Laterality: Left;  ? A/V FISTULAGRAM Left 07/06/2021  ? Procedure: A/V Fistulagram;  Surgeon: Algernon Huxley, MD;  Location: [place] [facility];  Service: Cardiovascular;  Laterality: Left;  ? AV FISTULA PLACEMENT Left   ? DIALYSIS/PERMA CATHETER INSERTION N/A 04/18/2021  ? Procedure: DIALYSIS/PERMA CATHETER INSERTION;  Surgeon: Katha Cabal, MD;  Location: [place] [facility];  Service: Cardiovascular;  Laterality: N/A;  ? IR CATHETER TUBE CHANGE  03/09/2021  ? ? ?Home Medications: ?Prior to Admission medications   ?Medication Sig Start Date End Date Taking? Authorizing Provider  ?amLODipine (NORVASC) 10 MG tablet Take 1 tablet (10 mg total) by mouth daily. 12/02/20  Yes Sharen Hones, MD  ?calcium acetate (PHOSLO) 667 MG capsule Take 2 capsules (1,334 mg total) by mouth 3 (three) times daily with meals. 03/10/21  Yes Nicole Kindred A, DO  ?carvedilol (COREG) 25 MG tablet Take 1 tablet (25 mg total) by mouth 2 (two) times daily with a meal. 03/15/21  Yes Mercy Riding, MD  ?dicyclomine (BENTYL) 10 MG capsule Take 1 capsule (10 mg total) by mouth  every 6 (six) hours as needed (nausea, emesis). 03/15/21  Yes Mercy Riding, MD  ?epoetin alfa (EPOGEN) 10000 UNIT/ML injection Inject 0.4 mLs (4,000 Units total) into the vein every Monday, Wednesday, and Friday with hemodialysis. 03/13/21  Yes Nicole Kindred A, DO  ?hydrALAZINE (APRESOLINE) 100 MG tablet Take 1 tablet (100 mg total) by mouth every 8 (eight) hours. 03/15/21  Yes Mercy Riding, MD  ?minoxidil (LONITEN) 2.5 MG tablet Take 2 tablets (5 mg total) by mouth daily. 12/03/20  Yes Sharen Hones, MD  ?pantoprazole  (PROTONIX) 40 MG tablet Take 1 tablet (40 mg total) by mouth daily. 03/15/21  Yes Mercy Riding, MD  ? ? ?Allergies: ?No Known Allergies ? ?Social History: ? reports that he has quit smoking. His smoking use included cigarettes. He has never used smokeless tobacco. He reports that he does not currently use alcohol. He reports that he does not use drugs.  ? ?Family History: ?Family History  ?Problem Relation Age of Onset  ? Hypertension Mother   ? Diabetes Mellitus II Mother   ? ? ?Review of Systems: ?Review of Systems  ?Constitutional:  Negative for chills and fever.  ?HENT:  Negative for hearing loss.   ?Respiratory:  Positive for shortness of breath.   ?Cardiovascular:  Negative for chest pain.  ?Gastrointestinal:  Negative for abdominal pain, nausea and vomiting.  ?Genitourinary:  Negative for dysuria.  ?Musculoskeletal:  Negative for myalgias.  ?Skin:  Negative for rash.  ?Neurological:  Negative for dizziness.  ?Psychiatric/Behavioral:  Negative for depression.   ? ?Physical Exam ?BP (!) 154/94   Pulse 90   Temp 98.6 ?F (37 ?C) (Oral)   Ht 5\' 8"  (1.727 m)   Wt 185 lb 9.6 oz (84.2 kg)   SpO2 99%   BMI 28.22 kg/m?  ?CONSTITUTIONAL: No acute distress, well-nourished ?HEENT:  Normocephalic, atraumatic, extraocular motion intact. ?NECK: Trachea is midline, and there is no jugular venous distension.  ?RESPIRATORY: Diminished breath sounds bilaterally.  Normal respiratory effort while wearing nasal cannula without pathologic use of accessory muscles. ?CARDIOVASCULAR: Regular rhythm and rate. ?GI: The abdomen is soft, nondistended, nontender.  No evidence of any hernias.  ?MUSCULOSKELETAL:  Normal muscle strength and tone in all four extremities.  No peripheral edema or cyanosis. ?SKIN: Skin turgor is normal. There are no pathologic skin lesions.  ?NEUROLOGIC:  Motor and sensation is grossly normal.  Cranial nerves are grossly intact. ?PSYCH:  Alert and oriented to person, place and time. Affect is  normal. ? ?Laboratory Analysis: ?Labs from 04/26/2021: ?Sodium 133, potassium 4.7, chloride 86, CO2 32, BUN 57, creatinine 13.86.  Total bilirubin 0.2, AST 17, ALT 12, alkaline phosphatase 62.  WBC 10.7, hemoglobin 7.1, hematocrit 21.2, platelets 315. ? ?Imaging: ?Chest x-ray on 04/26/2021: ?IMPRESSION: ?Stable bilateral effusions and parenchymal consolidation/atelectasis, left worse than right. ? ?Assessment and Plan: ?This is a 36 y.o. male with end-stage renal disease currently using a tunneled right IJ dialysis catheter. ? ?- Discussed with the patient that a peritoneal dialysis catheter will be helpful with his goals of being more mobile and being able to travel without needing to find a dialysis center.  Discussed with him that there are pros and cons to each method of dialysis he is still interested in pursuing peritoneal dialysis.  Discussed with him the plan for a laparoscopic assisted peritoneal dialysis catheter placement and reviewed with him the surgery at length including the incisions, that this an outpatient procedure, the risks of bleeding, infection, injury to surrounding  structures, and he is willing to proceed. ?- My main concern is with regards to his pleural effusions and his oxygen need.  He does not have a pulmonologist and is currently seeking a primary care provider.  As such, I will ask for anesthesiology team to consult on him to see if he would be an appropriate candidate for general anesthesia as he will require endotracheal intubation for a laparoscopic approach.  If he is not deemed a good candidate for this, then unfortunately would not be able to place a peritoneal dialysis catheter he will have to continue with hemodialysis instead.  Patient understanding of this. ?- Tentatively, we will schedule him for surgery on 07/27/2021 pending anesthesia consultation. ? ?I spent 80 minutes dedicated to the care of this patient on the date of this encounter to include pre-visit review of records,  face-to-face time with the patient discussing diagnosis and management, and any post-visit coordination of care. ? ? ?Melvyn Neth, MD ?[facility] ? ?  ?

## 2021-07-24 ENCOUNTER — Telehealth: Payer: Self-pay | Admitting: Surgery

## 2021-07-24 NOTE — Telephone Encounter (Signed)
Outgoing call is made, left message for patient to call, please inform him of the following regarding scheduled surgery:  ? ?Pre-Admission date/time, COVID Testing date and Surgery date. ? ?Surgery Date: 07/27/21   ?Preadmission Testing Date: 07/25/21 (phone 1p-5p) ?Covid Testing Date: Not needed.  ? ?Also patient will need to call at [phone], between 1-3:00pm the day before surgery, to find out what time to arrive for surgery.   ? ?

## 2021-07-24 NOTE — Telephone Encounter (Signed)
Patient calls back, he is now aware of all dates regarding his surgery and verbalized understanding. ?

## 2021-07-25 ENCOUNTER — Encounter
Admission: RE | Admit: 2021-07-25 | Discharge: 2021-07-25 | Disposition: A | Discharge status: Home / Self Care | Payer: Medicare Other | Source: Ambulatory Visit | Attending: Surgery | Admitting: Surgery

## 2021-07-25 ENCOUNTER — Encounter: Payer: Self-pay | Admitting: Surgery

## 2021-07-25 HISTORY — DX: Gastro-esophageal reflux disease without esophagitis: K21.9

## 2021-07-25 HISTORY — DX: Other complications of anesthesia, initial encounter: T88.59XA

## 2021-07-25 NOTE — Patient Instructions (Signed)
Your procedure is scheduled on: 07/27/21 Report to [place]. To find out your arrival time please call [phone] between 1PM - 3PM on 07/26/21.  Remember: Instructions that are not followed completely may result in serious medical risk, up to and including death, or upon the discretion of your surgeon and anesthesiologist your surgery may need to be rescheduled.     _X__ 1. Do not eat any food or drink any liquids after midnight the night before your procedure.                 No gum chewing or hard candies.   __X__2.  On the morning of surgery brush your teeth with toothpaste and water, you                 may rinse your mouth with mouthwash if you wish.  Do not swallow any              toothpaste of mouthwash.     _X__ 3.  No Alcohol for 24 hours before or after surgery.   _X__ 4.  Do Not Smoke or use e-cigarettes For 24 Hours Prior to Your Surgery.                 Do not use any chewable tobacco products for at least 6 hours prior to                 surgery.  ____  5.  Bring all medications with you on the day of surgery if instructed.   __X__  6.  Notify your doctor if there is any change in your medical condition      (cold, fever, infections).     Do not wear jewelry, make-up, hairpins, clips or nail polish. Do not wear lotions, powders, or perfumes.  Do not shave body hair 48 hours prior to surgery. Men may shave face and neck. Do not bring valuables to the hospital.    [facility] is not responsible for any belongings or valuables.  Contacts, dentures/partials or body piercings may not be worn into surgery. Bring a case for your contacts, glasses or hearing aids, a denture cup will be supplied. Leave your suitcase in the car. After surgery it may be brought to your room. For patients admitted to the hospital, discharge time is determined by your treatment team.   Patients discharged the day of surgery will not be  allowed to drive home.   Please read over the following fact sheets that you were given:     __X__ Take these medicines the morning of surgery with A SIP OF WATER:    1. amLODipine (NORVASC) 10 MG tablet  2. carvedilol (COREG) 25 MG tablet  3. hydrALAZINE (APRESOLINE) 100 MG tablet  4. pantoprazole (PROTONIX) 40 MG tablet  5.  6.  ____ Fleet Enema (as directed)   ____ Use CHG Soap/SAGE wipes as directed  ____ Use inhalers on the day of surgery  ____ Stop metformin/Janumet/Farxiga 2 days prior to surgery    ____ Take 1/2 of usual insulin dose the night before surgery. No insulin the morning          of surgery.   ____ Stop Blood Thinners Coumadin/Plavix/Xarelto/Pleta/Pradaxa/Eliquis/Effient/Aspirin  on   Or contact your Surgeon, Cardiologist or Medical Doctor regarding  ability to stop your blood thinners  __X__ Stop Anti-inflammatories 7 days before surgery such as Advil, Ibuprofen, Motrin,  BC or  Goodies Powder, Naprosyn, Naproxen, Aleve, Aspirin    __X__ Stop all herbals and supplements, fish oil or vitamins  until after surgery.    ____ Bring C-Pap to the hospital.

## 2021-07-26 ENCOUNTER — Encounter: Payer: Self-pay | Admitting: Surgery

## 2021-07-26 NOTE — Progress Notes (Addendum)
?Perioperative Services ? ?Pre-Admission/Anesthesia Testing Clinical Review ? ?Date: 07/26/21 ? ?Patient Demographics:  ?Name: Travis Long ?DOB:   09/28/1985 ?MRN:   [id number] ? ?Planned Surgical Procedure(s):  ? ? Case: [id number] Date/Time: 07/27/21 1245  ? Procedure: LAPAROSCOPIC INSERTION CONTINUOUS AMBULATORY PERITONEAL DIALYSIS  (CAPD) CATHETER  ? Anesthesia type: General  ? Pre-op diagnosis: ESRD  ? Location: [facility] OR ROOM 04 / [facility] ORS FOR ANESTHESIA GROUP  ? Surgeons: Olean Ree, MD  ? ?NOTE: Available PAT nursing documentation and vital signs have been reviewed. Clinical nursing staff has updated patient's PMH/PSHx, current medication list, and drug allergies/intolerances to ensure comprehensive history available to assist in medical decision making as it pertains to the aforementioned surgical procedure and anticipated anesthetic course. Extensive review of available clinical information performed. [place] PMH and PSHx updated with any diagnoses/procedures that  may have been inadvertently omitted during his intake with the pre-admission testing department's nursing staff. ? ?Clinical Discussion:  ?Travis Long is a 36 y.o. male who is submitted for pre-surgical anesthesia review and clearance prior to him undergoing the above procedure. Patient is a Former Smoker (quit 09/2019). 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  ? ?Patient on hemodialysis on Monday, Wednesday, and Friday. Notes indicate that due to "behavioral issues" while at [facility],  he has been banned from receiving dialysis services near his home in [place], [place]. Patient has been banned by all [facility] owned/operated dialysis centers. Provider at [facility] petitioned to have patient reaccepted for dialysis within the [facility] system, however petition was denied. Of note, patient ultimately desires to undergo renal transplant. With that being said, he has been denied renal transplant at [facility] due to a documented history of medical non-compliance.  ? ?Patient with a complicated medical history requiring multiple hospitalizations:   ? ?ADMISSION (04/27/2020 - 05/05/2021) --> Patient admitted to at [facility] when he was found to have pneumonia. He was treated with IV antibiotics. Patient received dialysis services during his admission. During course of treatment, patient had disagreement with nephrologist and communicated violent threats. He ultimately signed out AMA on 05/05/2020.  ? ?ADMISSION (05/08/2020 - 05/18/2020) --> Patient presented to [facility] for increased shortness of breath. Patient unable to receive dialysis services in the [facility] system, thus he presented in volume overload. Patient found to be anemic during admission; Hgb levels as low as 6.1 gm/dL. Transfusion was recommended, however patient declined. Patient did agree to treatment with IV iron infusions and EPO injections. CT imaging revealed moderate to large BILATERAL pleural effusions that were stable to slightly increased on the LEFT. Mild dependent adjacent compressive atelectasis noted. Patient consented to BILATERAL thoracenteses; 2130 cc yield from the LEFT and 1030 cc from the RIGHT. Dialysis services resumed during admission. Patient was stabilized. Care management became involved due to issues with patient's inability to receive outpatient dialysis services. They were able to get patient accepted to the nephrology service in [place]. Patient discharged home in stable condition on 05/18/2020 with plans for outpatient follow up with Dr. Anthonette Legato,  MD in [place], [place].  ? ?ADMISSION (11/30/2020 - 12/02/2020) --> Patient presented to the ED at [facility] hypoxic and signs of  fluid overload. Oxygen saturations in the low 80s despite hemodialysis treatment earlier in the day removing approximately 4 L of fluid.  CXR revealed a significant LEFT pleural effusion as well as pulmonary edema.  Patient reportedly noncompliant with prescribed antihypertensives; blood pressure documented at 220/145 mmHg.  Patient underwent BILATERAL thoracenteses with 1.2 L being removed from each lung. He was dialyzed during his admission. Blood pressured normalized. Patient discharged home in stable condition on 12/02/2020 with plans for outpatient follow up with nephrology.  ? ?ADMISSION (03/06/2021 - 03/10/2021) --> Patient presented to the ED with complaints of worsening exertional dyspnea in the setting of known BILATERAL pleural effusions.  Patient reported that he had not been taking his antihypertensive medications because they were in an upstairs bedroom, and due to his shortness of breath, he was unable to reach them.  Patient missed scheduled dialysis treatment that day.  Presenting blood pressure 207/129 mmHg. Hospital course complicated by acute metabolic encephalopathy.  Patient wanted to sign out AMA and ultimately had to be placed under involuntary commitment compelling him to stay due to acute health issues. CXR revealed increased opacification of the LEFT hemithorax secondary to recurrent pleural effusion and associated atelectasis.  Patient underwent thoracentesis with a 750 cc fluid yield.  CT of the chest, following thoracentesis, revealed consolidation consistent with pneumonia.  There was loculated parapneumonic pleural fluid consistent with a developing empyema.  TTE performed on 03/09/2021 showed a normal left ventricular systolic function with severe LVH; LVEF 60-65%.  Diastolic Doppler parameters consistent with  pseudonormalization (G2DD); GLS -8.5%.  Patient underwent CT-guided chest tube placement (14 Fr) for empyema evacuation and tPA administration.  850 cc fluid removed during chest tube insertion.  Chest tube became dislodged and required repositioning and subsequent up sizing to 16 Fr. repeat imaging showed progression of hydropneumothorax.  The decision was made to transfer patient to [facility] for CVTS consultation. ? ?ADMISSION (03/10/2021 - 03/15/2021) --> patient admitted to [facility] for ongoing care.  Patient was seen in consult by Dr. Gilford Raid, MD (CVTS).  Etiology of effusion felt to be parapneumonic or CHF.  MD noted that lung appeared to be encased and a "thick peel" and most likely trapped by scar tissue.  Despite multiple thoracenteses, patient's lung failed to reexpand accounting for the a lateral pneumothorax seen on imaging.  CVTS did not feel that it was feasible to perform a LEFT thoracotomy to drain the residual fluid and decorticate the lung.  Per Dr. Cyndia Bent, "procedure would likely result in significant injury to the lung with prolonged air leak, and I do not think that the LEFT lung is going to reexpand.  Patient would likely end up with a chronic chest tube draining, persistent air leaks, and would be worse off than he is now".  Surgeon added that surgical intervention and these cases need to occur within the first few weeks to have any chance of reexpanding the lung.  Patient received ongoing management of acute issues and was ultimately discharged home in stable condition on 03/15/2021 with plans to follow-up with outpatient nephrology and pulmonology. ? ?ADMISSION (04/17/2021 - 04/27/2021) --> patient presented cone [facility] from the hemodialysis center for worsening shortness of breath.  Patient denied any chest pain.  He was significantly hypertensive at 194/130 mmHg.  CXR showed a large LEFT pleural effusion despite previous  interventions.  Patient presented dyspneic, but not hypoxic felt to be secondary to incomplete hemodialysis treatment. Partial treatment due to  malfunction of vascular assess. RIGHT IJ tunneled dialysis catheter placed and pati

## 2021-07-27 ENCOUNTER — Ambulatory Visit: Payer: Medicare Other | Admitting: Urgent Care

## 2021-07-27 ENCOUNTER — Other Ambulatory Visit: Payer: Self-pay

## 2021-07-27 ENCOUNTER — Ambulatory Visit
Admission: RE | Admit: 2021-07-27 | Discharge: 2021-07-27 | Disposition: A | Discharge status: Home / Self Care | Payer: Medicare Other | Attending: Surgery | Admitting: Surgery

## 2021-07-27 ENCOUNTER — Encounter
Admission: RE | Disposition: A | Discharge status: Home / Self Care | Payer: Self-pay | Source: Home / Self Care | Attending: Surgery

## 2021-07-27 ENCOUNTER — Encounter: Payer: Self-pay | Admitting: Surgery

## 2021-07-27 DIAGNOSIS — Z87891 Personal history of nicotine dependence: Secondary | ICD-10-CM | POA: Insufficient documentation

## 2021-07-27 DIAGNOSIS — I132 Hypertensive heart and chronic kidney disease with heart failure and with stage 5 chronic kidney disease, or end stage renal disease: Secondary | ICD-10-CM | POA: Diagnosis not present

## 2021-07-27 DIAGNOSIS — I509 Heart failure, unspecified: Secondary | ICD-10-CM | POA: Insufficient documentation

## 2021-07-27 DIAGNOSIS — I120 Hypertensive chronic kidney disease with stage 5 chronic kidney disease or end stage renal disease: Secondary | ICD-10-CM | POA: Diagnosis present

## 2021-07-27 DIAGNOSIS — Z992 Dependence on renal dialysis: Secondary | ICD-10-CM | POA: Diagnosis not present

## 2021-07-27 DIAGNOSIS — N186 End stage renal disease: Secondary | ICD-10-CM

## 2021-07-27 HISTORY — DX: Patient's noncompliance with other medical treatment and regimen due to unspecified reason: Z91.199

## 2021-07-27 HISTORY — DX: Pleural effusion, not elsewhere classified: J90

## 2021-07-27 HISTORY — DX: Anemia in chronic kidney disease: D63.1

## 2021-07-27 HISTORY — DX: Cannabis use, unspecified, uncomplicated: F12.90

## 2021-07-27 HISTORY — DX: Other specified health status: Z78.9

## 2021-07-27 HISTORY — PX: CAPD INSERTION: SHX5233

## 2021-07-27 HISTORY — DX: Metabolic encephalopathy: G93.41

## 2021-07-27 HISTORY — DX: Major depressive disorder, single episode, unspecified: F32.9

## 2021-07-27 HISTORY — DX: Other forms of dyspnea: R06.09

## 2021-07-27 HISTORY — DX: Other ill-defined heart diseases: I51.89

## 2021-07-27 LAB — POCT I-STAT, CHEM 8
BUN: 42 mg/dL — ABNORMAL HIGH (ref 6–20)
Calcium, Ion: 0.97 mmol/L — ABNORMAL LOW (ref 1.15–1.40)
Chloride: 93 mmol/L — ABNORMAL LOW (ref 98–111)
Creatinine, Ser: 11.8 mg/dL — ABNORMAL HIGH (ref 0.61–1.24)
Glucose, Bld: 92 mg/dL (ref 70–99)
HCT: 33 % — ABNORMAL LOW (ref 39.0–52.0)
Hemoglobin: 11.2 g/dL — ABNORMAL LOW (ref 13.0–17.0)
Potassium: 4.5 mmol/L (ref 3.5–5.1)
Sodium: 134 mmol/L — ABNORMAL LOW (ref 135–145)
TCO2: 30 mmol/L (ref 22–32)

## 2021-07-27 SURGERY — LAPAROSCOPIC INSERTION CONTINUOUS AMBULATORY PERITONEAL DIALYSIS  (CAPD) CATHETER
Anesthesia: General

## 2021-07-27 MED ORDER — GLYCOPYRROLATE 0.2 MG/ML IJ SOLN
INTRAMUSCULAR | Status: AC
  Filled 2021-07-27: qty 3

## 2021-07-27 MED ORDER — ORAL CARE MOUTH RINSE: 15.0000 mL | Freq: Once | OROMUCOSAL | Status: AC

## 2021-07-27 MED ORDER — ONDANSETRON HCL 4 MG/2ML IJ SOLN
INTRAMUSCULAR | Status: AC
  Filled 2021-07-27: qty 2

## 2021-07-27 MED ORDER — GLYCOPYRROLATE 0.2 MG/ML IJ SOLN
INTRAMUSCULAR | Status: DC | PRN
  Administered 2021-07-27: .8 mg via INTRAVENOUS

## 2021-07-27 MED ORDER — MIDAZOLAM HCL 2 MG/2ML IJ SOLN
INTRAMUSCULAR | Status: DC | PRN
  Administered 2021-07-27: 1.5 mg via INTRAVENOUS
  Administered 2021-07-27: .5 mg via INTRAVENOUS

## 2021-07-27 MED ORDER — ONDANSETRON HCL 4 MG/2ML IJ SOLN
INTRAMUSCULAR | Status: DC | PRN
  Administered 2021-07-27: 4 mg via INTRAVENOUS

## 2021-07-27 MED ORDER — FENTANYL CITRATE (PF) 100 MCG/2ML IJ SOLN
INTRAMUSCULAR | Status: DC | PRN
  Administered 2021-07-27 (×2): 25 ug via INTRAVENOUS
  Administered 2021-07-27: 50 ug via INTRAVENOUS

## 2021-07-27 MED ORDER — BUPIVACAINE-EPINEPHRINE (PF) 0.5% -1:200000 IJ SOLN
INTRAMUSCULAR | Status: AC
  Filled 2021-07-27: qty 30

## 2021-07-27 MED ORDER — FENTANYL CITRATE (PF) 100 MCG/2ML IJ SOLN: 25.0000 ug | INTRAMUSCULAR | Status: DC | PRN

## 2021-07-27 MED ORDER — ACETAMINOPHEN 500 MG PO TABS
ORAL_TABLET | ORAL | Status: AC
  Administered 2021-07-27: 1000 mg via ORAL
  Filled 2021-07-27: qty 2

## 2021-07-27 MED ORDER — PHENYLEPHRINE HCL-NACL 20-0.9 MG/250ML-% IV SOLN
INTRAVENOUS | Status: AC
  Filled 2021-07-27: qty 250

## 2021-07-27 MED ORDER — CHLORHEXIDINE GLUCONATE 0.12 % MT SOLN
OROMUCOSAL | Status: AC
  Administered 2021-07-27: 15 mL via OROMUCOSAL
  Filled 2021-07-27: qty 15

## 2021-07-27 MED ORDER — PHENYLEPHRINE HCL (PRESSORS) 10 MG/ML IV SOLN
INTRAVENOUS | Status: DC | PRN
  Administered 2021-07-27 (×3): 80 ug via INTRAVENOUS

## 2021-07-27 MED ORDER — NEOSTIGMINE METHYLSULFATE 10 MG/10ML IV SOLN
INTRAVENOUS | Status: AC
  Filled 2021-07-27: qty 1

## 2021-07-27 MED ORDER — DEXAMETHASONE SODIUM PHOSPHATE 10 MG/ML IJ SOLN
INTRAMUSCULAR | Status: DC | PRN
  Administered 2021-07-27: 5 mg via INTRAVENOUS

## 2021-07-27 MED ORDER — CHLORHEXIDINE GLUCONATE 0.12 % MT SOLN: 15.0000 mL | Freq: Once | OROMUCOSAL | Status: AC

## 2021-07-27 MED ORDER — OXYCODONE HCL 5 MG PO TABS: 5.0000 mg | ORAL_TABLET | ORAL | 0 refills | Status: DC | PRN

## 2021-07-27 MED ORDER — CHLORHEXIDINE GLUCONATE CLOTH 2 % EX PADS: 6.0000 | MEDICATED_PAD | Freq: Once | CUTANEOUS | Status: DC

## 2021-07-27 MED ORDER — PROPOFOL 10 MG/ML IV BOLUS
INTRAVENOUS | Status: AC
  Filled 2021-07-27: qty 20

## 2021-07-27 MED ORDER — BUPIVACAINE LIPOSOME 1.3 % IJ SUSP: 20.0000 mL | Freq: Once | INTRAMUSCULAR | Status: DC

## 2021-07-27 MED ORDER — FENTANYL CITRATE (PF) 100 MCG/2ML IJ SOLN
INTRAMUSCULAR | Status: AC
  Filled 2021-07-27: qty 2

## 2021-07-27 MED ORDER — ACETAMINOPHEN 500 MG PO TABS: 1000.0000 mg | ORAL_TABLET | Freq: Four times a day (QID) | ORAL | Status: AC | PRN

## 2021-07-27 MED ORDER — CEFAZOLIN SODIUM-DEXTROSE 2-4 GM/100ML-% IV SOLN
INTRAVENOUS | Status: AC
  Filled 2021-07-27: qty 100

## 2021-07-27 MED ORDER — LIDOCAINE HCL (CARDIAC) PF 100 MG/5ML IV SOSY
PREFILLED_SYRINGE | INTRAVENOUS | Status: DC | PRN
  Administered 2021-07-27: 80 mg via INTRAVENOUS

## 2021-07-27 MED ORDER — PHENYLEPHRINE HCL-NACL 20-0.9 MG/250ML-% IV SOLN
INTRAVENOUS | Status: DC | PRN
  Administered 2021-07-27: 50 ug/min via INTRAVENOUS

## 2021-07-27 MED ORDER — SODIUM CHLORIDE 0.9 % IV SOLN
INTRAVENOUS | Status: DC
Start: 2021-07-27 — End: 2021-07-27

## 2021-07-27 MED ORDER — MIDAZOLAM HCL 2 MG/2ML IJ SOLN
INTRAMUSCULAR | Status: AC
Start: 2021-07-27 — End: ?
  Filled 2021-07-27: qty 2

## 2021-07-27 MED ORDER — DEXMEDETOMIDINE (PRECEDEX) IN NS 20 MCG/5ML (4 MCG/ML) IV SYRINGE
PREFILLED_SYRINGE | INTRAVENOUS | Status: DC | PRN
  Administered 2021-07-27: 12 ug via INTRAVENOUS
  Administered 2021-07-27: 8 ug via INTRAVENOUS

## 2021-07-27 MED ORDER — ROCURONIUM BROMIDE 10 MG/ML (PF) SYRINGE
PREFILLED_SYRINGE | INTRAVENOUS | Status: AC
  Filled 2021-07-27: qty 10

## 2021-07-27 MED ORDER — OXYCODONE HCL 5 MG PO TABS
5.0000 mg | ORAL_TABLET | Freq: Once | ORAL | Status: AC | PRN
  Administered 2021-07-27: 5 mg via ORAL

## 2021-07-27 MED ORDER — ACETAMINOPHEN 500 MG PO TABS: 1000.0000 mg | ORAL_TABLET | ORAL | Status: AC

## 2021-07-27 MED ORDER — OXYCODONE HCL 5 MG PO TABS
ORAL_TABLET | ORAL | Status: AC
  Filled 2021-07-27: qty 1

## 2021-07-27 MED ORDER — OXYCODONE HCL 5 MG/5ML PO SOLN: 5.0000 mg | Freq: Once | ORAL | Status: AC | PRN

## 2021-07-27 MED ORDER — HEPARIN SODIUM (PORCINE) 5000 UNIT/ML IJ SOLN
INTRAMUSCULAR | Status: DC | PRN
  Administered 2021-07-27: 1000 mL

## 2021-07-27 MED ORDER — NEOSTIGMINE METHYLSULFATE 10 MG/10ML IV SOLN
INTRAVENOUS | Status: DC | PRN
  Administered 2021-07-27: 4 mg via INTRAVENOUS

## 2021-07-27 MED ORDER — ROCURONIUM BROMIDE 100 MG/10ML IV SOLN
INTRAVENOUS | Status: DC | PRN
  Administered 2021-07-27: 40 mg via INTRAVENOUS
  Administered 2021-07-27: 5 mg via INTRAVENOUS
  Administered 2021-07-27: 15 mg via INTRAVENOUS

## 2021-07-27 MED ORDER — BUPIVACAINE-EPINEPHRINE 0.5% -1:200000 IJ SOLN
INTRAMUSCULAR | Status: DC | PRN
  Administered 2021-07-27: 30 mL

## 2021-07-27 MED ORDER — LIDOCAINE HCL (PF) 2 % IJ SOLN
INTRAMUSCULAR | Status: AC
  Filled 2021-07-27: qty 5

## 2021-07-27 MED ORDER — PROPOFOL 10 MG/ML IV BOLUS
INTRAVENOUS | Status: DC | PRN
  Administered 2021-07-27: 160 mg via INTRAVENOUS
  Administered 2021-07-27: 40 mg via INTRAVENOUS

## 2021-07-27 MED ORDER — GLYCOPYRROLATE 0.2 MG/ML IJ SOLN
INTRAMUSCULAR | Status: AC
  Filled 2021-07-27: qty 1

## 2021-07-27 MED ORDER — GABAPENTIN 100 MG PO CAPS
ORAL_CAPSULE | ORAL | Status: AC
  Administered 2021-07-27: 200 mg via ORAL
  Filled 2021-07-27: qty 2

## 2021-07-27 MED ORDER — DEXAMETHASONE SODIUM PHOSPHATE 10 MG/ML IJ SOLN
INTRAMUSCULAR | Status: AC
  Filled 2021-07-27: qty 1

## 2021-07-27 MED ORDER — GABAPENTIN 100 MG PO CAPS: 200.0000 mg | ORAL_CAPSULE | ORAL | Status: AC

## 2021-07-27 MED ORDER — CEFAZOLIN SODIUM-DEXTROSE 2-4 GM/100ML-% IV SOLN
2.0000 g | INTRAVENOUS | Status: AC
  Administered 2021-07-27: 2 g via INTRAVENOUS

## 2021-07-27 MED ORDER — HEPARIN SODIUM (PORCINE) 5000 UNIT/ML IJ SOLN
INTRAMUSCULAR | Status: AC
  Filled 2021-07-27: qty 1

## 2021-07-27 MED ORDER — ONDANSETRON HCL 4 MG/2ML IJ SOLN: 4.0000 mg | Freq: Once | INTRAMUSCULAR | Status: DC | PRN

## 2021-07-27 SURGICAL SUPPLY — 48 items
ADAPTER CATH DIALYSIS 4X8 IT L (MISCELLANEOUS) ×2 IMPLANT
ADH SKN CLS APL DERMABOND .7 (GAUZE/BANDAGES/DRESSINGS) ×1
BIOPATCH WHT 1IN DISK W/4.0 H (GAUZE/BANDAGES/DRESSINGS) ×1 IMPLANT
BLADE SURG 11 STRL SS SAFETY (MISCELLANEOUS) ×2 IMPLANT
CATH EXTENDED DIALYSIS (CATHETERS) ×2 IMPLANT
DERMABOND ADVANCED (GAUZE/BANDAGES/DRESSINGS) ×1
DERMABOND ADVANCED .7 DNX12 (GAUZE/BANDAGES/DRESSINGS) ×1 IMPLANT
ELECT CAUTERY BLADE 6.4 (BLADE) ×2 IMPLANT
ELECT REM PT RETURN 9FT ADLT (ELECTROSURGICAL) ×2
ELECTRODE REM PT RTRN 9FT ADLT (ELECTROSURGICAL) ×1 IMPLANT
GAUZE SPONGE 4X4 12PLY STRL (GAUZE/BANDAGES/DRESSINGS) ×1 IMPLANT
GLOVE SURG SYN 7.0 (GLOVE) ×4 IMPLANT
GLOVE SURG SYN 7.0 PF PI (GLOVE) ×1 IMPLANT
GLOVE SURG SYN 7.5  E (GLOVE) ×2
GLOVE SURG SYN 7.5 E (GLOVE) ×2 IMPLANT
GLOVE SURG SYN 7.5 PF PI (GLOVE) ×1 IMPLANT
GOWN STRL REUS W/ TWL LRG LVL3 (GOWN DISPOSABLE) ×2 IMPLANT
GOWN STRL REUS W/TWL LRG LVL3 (GOWN DISPOSABLE) ×4
IV NS 1000ML (IV SOLUTION) ×2
IV NS 1000ML BAXH (IV SOLUTION) ×1 IMPLANT
KIT TURNOVER KIT A (KITS) ×2 IMPLANT
LABEL OR SOLS (LABEL) ×2 IMPLANT
MANIFOLD NEPTUNE II (INSTRUMENTS) ×2 IMPLANT
MINICAP W/POVIDONE IODINE SOL (MISCELLANEOUS) ×2 IMPLANT
NDL INSUFFLATION 14GA 120MM (NEEDLE) ×1 IMPLANT
NEEDLE HYPO 22GX1.5 SAFETY (NEEDLE) ×2 IMPLANT
NEEDLE INSUFFLATION 14GA 120MM (NEEDLE) ×2 IMPLANT
NS IRRIG 500ML POUR BTL (IV SOLUTION) ×2 IMPLANT
PACK LAP CHOLECYSTECTOMY (MISCELLANEOUS) ×2 IMPLANT
PENCIL ELECTRO HAND CTR (MISCELLANEOUS) ×2 IMPLANT
SET CYSTO W/LG BORE CLAMP LF (SET/KITS/TRAYS/PACK) ×2 IMPLANT
SET TRANSFER 6 W/TWIST CLAMP 5 (SET/KITS/TRAYS/PACK) ×2 IMPLANT
SET TUBE SMOKE EVAC HIGH FLOW (TUBING) ×2 IMPLANT
SLEEVE ADV FIXATION 5X100MM (TROCAR) ×4 IMPLANT
SPONGE DRAIN TRACH 4X4 STRL 2S (GAUZE/BANDAGES/DRESSINGS) ×2 IMPLANT
STYLET FALLER (MISCELLANEOUS) ×1 IMPLANT
STYLET FALLER MEDIONICS (MISCELLANEOUS) IMPLANT
SUT ETHILON 2 0 FS 18 (SUTURE) ×2 IMPLANT
SUT MNCRL 4-0 (SUTURE) ×2
SUT MNCRL 4-0 27XMFL (SUTURE) ×1
SUT VIC AB 3-0 SH 27 (SUTURE) ×2
SUT VIC AB 3-0 SH 27X BRD (SUTURE) ×1 IMPLANT
SUT VICRYL 0 AB UR-6 (SUTURE) ×2 IMPLANT
SUTURE MNCRL 4-0 27XMF (SUTURE) ×1 IMPLANT
SYS KII FIOS ACCESS ABD 5X100 (TROCAR) ×2
SYSTEM KII FIOS ACES ABD 5X100 (TROCAR) ×1 IMPLANT
TROCAR KII BLADELESS 5X150 (TROCAR) ×2 IMPLANT
WATER STERILE IRR 500ML POUR (IV SOLUTION) ×1 IMPLANT

## 2021-07-27 NOTE — Anesthesia Postprocedure Evaluation (Signed)
Anesthesia Post Note ? ?Patient: Travis Long ? ?Procedure(s) Performed: LAPAROSCOPIC INSERTION CONTINUOUS AMBULATORY PERITONEAL DIALYSIS  (CAPD) CATHETER ? ?Patient location during evaluation: PACU ?Anesthesia Type: General ?Level of consciousness: awake and alert ?Pain management: pain level controlled ?Vital Signs Assessment: post-procedure vital signs reviewed and stable ?Respiratory status: spontaneous breathing, nonlabored ventilation, respiratory function stable and patient connected to nasal cannula oxygen ?Cardiovascular status: blood pressure returned to baseline and stable ?Postop Assessment: no apparent nausea or vomiting ?Anesthetic complications: no ? ? ?No notable events documented. ? ? ?Last Vitals:  ?Vitals:  ? 07/27/21 1500 07/27/21 1515  ?BP: (!) 159/109 (!) 159/104  ?Pulse: 89 87  ?Resp: (!) 22 (!) 21  ?Temp: 36.8 ?C   ?SpO2: 100% 99%  ?  ?Last Pain:  ?Vitals:  ? 07/27/21 1500  ?TempSrc:   ?PainSc: 0-No pain  ? ? ?  ?  ?  ?  ?  ?  ? ?Arita Miss ? ? ? ? ?

## 2021-07-27 NOTE — Interval H&P Note (Signed)
History and Physical Interval Note: ? ?07/27/2021 ?11:49 AM ? ?Travis Long  has presented today for surgery, with the diagnosis of ESRD.  The various methods of treatment have been discussed with the patient and family. After consideration of risks, benefits and other options for treatment, the patient has consented to  Procedure(s): ?LAPAROSCOPIC INSERTION CONTINUOUS AMBULATORY PERITONEAL DIALYSIS  (CAPD) CATHETER (N/A) as a surgical intervention.  The patient's history has been reviewed, patient examined, no change in status, stable for surgery.  I have reviewed the patient's chart and labs.  Questions were answered to the patient's satisfaction.   ? ? ?Buford Gayler ? ? ?

## 2021-07-27 NOTE — Brief Op Note (Signed)
07/27/2021 ? ?2:38 PM ? ?PATIENT:  Travis Long  36 y.o. male ? ?PRE-OPERATIVE DIAGNOSIS:  ESRD ? ?POST-OPERATIVE DIAGNOSIS:  ESRD ? ?PROCEDURE:  Procedure(s): ?LAPAROSCOPIC INSERTION CONTINUOUS AMBULATORY PERITONEAL DIALYSIS  (CAPD) CATHETER (N/A) ? ?SURGEON:  Surgeon(s) and Role: ?   Olean Ree, MD - Primary ? ?ANESTHESIA:   general ? ?EBL:  2 mL  ? ?BLOOD ADMINISTERED:none ? ?DRAINS:  peritoneal dialysis catheter   ? ?LOCAL MEDICATIONS USED:  BUPIVICAINE  ? ?SPECIMEN:  No Specimen ? ?DISPOSITION OF SPECIMEN:  N/A ? ?COUNTS:  YES ? ?TOURNIQUET:  * No tourniquets in log * ? ?DICTATION: .Dragon Dictation ? ?PLAN OF CARE: Discharge to home after PACU ? ?PATIENT DISPOSITION:  PACU - hemodynamically stable. ?  ?Delay start of Pharmacological VTE agent (>24hrs) due to surgical blood loss or risk of bleeding: yes ? ?

## 2021-07-27 NOTE — Anesthesia Preprocedure Evaluation (Addendum)
Anesthesia Evaluation  ?Patient identified by MRN, date of birth, ID band ?Patient awake ? ?General Assessment Comment: ? ?Patient with chronic moderate pleural effusions, with recent admission in late January 2023 and management of it. Cardiothoracic surgery did not deem him a surgical candidate due to the chronicity of the effusions and they were doubtful that decortication would lead to meaningful improvement. He has  Chronic pigtail for drainage. He was discharged on 2L/min supplemental O2, which the patient claims he does not need to use often. ?At his follow up in April with thoracic surgery, his pleural effusion size was essentially unchanged from January. ? ?Dr Hampton Abbot (surgeon) reached out to me due to concerns about high anesthetic risk given the patient's comorbidities. Patient strongly requests peritoneal dialysis catheter rather than fistula/graft (which would have been simple to do a nerve block with minimal sedation). I inquired with Dr Hampton Abbot whether he could perform an open, rather than laparoscopic, placement of a PD catheter, or if it could be placed by Interventional Radiology. After consulting with another surgeon, he deemed that an open technique would lead to more postoperative issue, however they would try to use lower insufflation pressures and skip the omentopexy component to not add more anesthetic time. He also consulted with IR who said that they do not place PD catheters under fluoroscopic guidance here. ? ?Given that thoracic surgery does not deem any further procedures appropriate, it appears this patient is as stable as he is going to be at this point. He is not an ideal candidate for laparoscopic surgery given his bilateral moderate pleural effusions and restrictive lung pathology, however I have made all attempts to reduce his risk, as delineated above. ? ?Reviewed: ?Allergy & Precautions, NPO status , Patient's Chart, lab work & pertinent test  results ? ?History of Anesthesia Complications ?Negative for: history of anesthetic complications ? ?Airway ?Mallampati: II ? ?TM Distance: >3 FB ?Neck ROM: Full ? ? ? Dental ?no notable dental hx. ?(+) Teeth Intact ?  ?Pulmonary ?shortness of breath, with exertion and Long-Term Oxygen Therapy, neg sleep apnea, neg COPD, Patient abstained from smoking.Not current smoker, former smoker,  ?CT scan 06/2021: ?1. ?Thick walled, loculated left moderate-sized pleural effusion is  ?unchanged in size with resolution of previously seen air component. There  ?is unchanged rounded atelectasis in the left lower lobe.  ?2. ?Unchanged moderate sized right thick-walled pleural effusion with right  ?middle and lower lobe rounded atelectasis.  ? ? ?PFTs indicative of restrictive lung pathology 06/2021: ?FEV1/FVC Pre % 89.44   ?FVC Pre  1.16 L ?FEV1 Pre  1.03 L ? ? ?  ? ?+ decreased breath sounds ? ? ? ? ? Cardiovascular ?Exercise Tolerance: Good ?METShypertension, Pt. on medications ?+CHF  ?(-) CAD and (-) Past MI (-) dysrhythmias  ?Rhythm:Regular Rate:Normal ?- Systolic murmurs ?1. Left ventricular ejection fraction, by estimation, is 60 to 65%. The  ?left ventricle has normal function. The left ventricle has no regional  ?wall motion abnormalities. There is severe left ventricular hypertrophy.  ?Left ventricular diastolic parameters  ??are consistent with Grade II diastolic dysfunction (pseudonormalization).  ?The average left ventricular global longitudinal strain is -8.5 %. The  ?global longitudinal strain is abnormal.  ??2. Right ventricular systolic function is normal. The right ventricular  ?size is normal. Moderately increased right ventricular wall thickness.  ??3. Left atrial size was mildly dilated.  ??4. A small pericardial effusion is present.  ??5. The mitral valve is normal in structure. No evidence of mitral valve  ?  regurgitation. No evidence of mitral stenosis.  ??6. The aortic valve is normal in structure. Aortic  valve regurgitation is  ?not visualized. No aortic stenosis is present.  ??7. The inferior vena cava is normal in size with greater than 50%  ?respiratory variability, suggesting right atrial pressure of 3 mmHg.  ?  ?Neuro/Psych ?PSYCHIATRIC DISORDERS Anxiety Depression negative neurological ROS ?   ? GI/Hepatic ?GERD  Controlled,(+)  ?  ? (-) substance abuse ? ,   ?Endo/Other  ?neg diabetes ? Renal/GU ?ESRF and DialysisRenal diseaseLast dialyzed yesterday  ? ?  ?Musculoskeletal ? ? Abdominal ?  ?Peds ? Hematology ? ?(+) Blood dyscrasia, anemia ,   ?Anesthesia Other Findings ?Past Medical History: ?No date: Acute pulmonary edema (HCC) ?No date: Acute respiratory failure with hypoxia ([place]) ?No date: Anemia of chronic renal failure ?    Comment:  a.) receiving epoetin alfa (EPOGEN) injections ?No date: CAP (community acquired pneumonia) ?No date: Diastolic dysfunction ?    Comment:  a.) TTE 03/09/2021: EF 60-65%; sev LVH; GLS -8.5%; mild  ?             LA dilation; mild TR/PR; G2DD. ?No date: Dyspnea on exertion ?No date: ESRD on hemodialysis ([place]) ?    Comment:  a.) banned by all [facility] owned/operated dialysis  ?             centers due to "behavioral issues"; [facility] provider  ?             petitioned for him to be re-accepted, but was denied. b.) ?             currently on HD with a M-W-F schedule; plans are to  ?             transition to PD once catheter placed in 07/2021. c.) has ?             been seen by [facility] renal transplant service; rejected due  ?             to medical non-compliance. ?No date: GERD (gastroesophageal reflux disease) ?No date: Hypertension ?No date: Major depression ?No date: Marijuana use ?No date: Medically noncompliant ?    Comment:  a.) does not take antihypertensives as Rx. b.) misses  ?             scheduled HD treatments. c.) refuses medically necessary  ?             interventions as recommended by his providers (i.e. blood ?             transfusions). d.) involuntarily  committed in 02/2021 due ?             attempts to sign out AMA during acute illness; psychiatry ?             consulted. e.) rejected by [facility] for consideration of  ?             renal transplant due to past non-compliance. ?No date: Metabolic encephalopathy ?No date: On supplemental oxygen by nasal cannula ?    Comment:  a.) 1-3 L/[place] on a PRN basis; mostly used with exertion ?03/2021: Procedural agitation ?    Comment:  a.) post-procedural agitation following IJ catheter  ?             placement in 03/2021; occurred in setting of significant  ?             hypoxia, metabolic  encephalopathy, and procedural  ?             sedation; states, "I was tied down and it caused me to  ?             spazz out". ?No date: Recurrent pleural effusion ?    Comment:  a.) s/p multiple thoracenteses with (+) significant  ?             fluid yields: 2130 cc LEFT (05/12/2020), 1030 cc RIGHT  ?             (05/14/2020), 1200 cc LEFT 11/30/2020, 1200 cc RIGHT  ?             (12/01/2020), 750 cc LEFT (03/06/2021), 850 cc  ?             (03/08/2021). b.) 03/08/2021 -->  LEFT 14 Fr pigtail  ?             catheter to PleurEvac placed. c.) empyema developed  ?             requiring upsizing of chest tube on 03/09/2021; up-sized  ?             to 16 Fr. ?No date: Tobacco use ? ? Reproductive/Obstetrics ? ?  ? ? ? ? ? ? ? ? ? ? ? ? ? ?  ?  ? ? ? ? ? ? ?Anesthesia Physical ?Anesthesia Plan ? ?ASA: 4 ? ?Anesthesia Plan: General  ? ?Post-op Pain Management: Tylenol PO (pre-op)* and Gabapentin PO (pre-op)*  ? ?Induction: Intravenous ? ?PONV Risk Score and Plan: 3 and Ondansetron, Dexamethasone and Midazolam ? ?Airway Management Planned: Oral ETT ? ?Additional Equipment: None ? ?Intra-op Plan:  ? ?Post-operative Plan: Extubation in OR and Possible Post-op intubation/ventilation ? ?Informed Consent: I have reviewed the patients History and Physical, chart, labs and discussed the procedure including the risks, benefits and alternatives for the  proposed anesthesia with the patient or authorized representative who has indicated his/her understanding and acceptance.  ? ? ? ?Dental advisory given ? ?Plan Discussed with: CRNA and Surgeon ? ?Anesthesia Plan Comments

## 2021-07-27 NOTE — Discharge Instructions (Signed)

## 2021-07-27 NOTE — Anesthesia Procedure Notes (Signed)
Procedure Name: Intubation ?Date/Time: 07/27/2021 1:18 PM ?Performed by: Demetrius Charity, CRNA ?Pre-anesthesia Checklist: Patient identified, Patient being monitored, Timeout performed, Emergency Drugs available and Suction available ?Patient Re-evaluated:Patient Re-evaluated prior to induction ?Oxygen Delivery Method: Circle system utilized ?Preoxygenation: Pre-oxygenation with 100% oxygen ?Induction Type: IV induction ?Ventilation: Mask ventilation without difficulty and Oral airway inserted - appropriate to patient size ?Laryngoscope Size: McGraph and 4 ?Grade View: Grade I ?Tube type: Oral ?Tube size: 8.0 mm ?Number of attempts: 1 ?Airway Equipment and Method: Stylet ?Placement Confirmation: ETT inserted through vocal cords under direct vision, positive ETCO2 and breath sounds checked- equal and bilateral ?Secured at: 23 cm ?Tube secured with: Tape ?Dental Injury: Teeth and Oropharynx as per pre-operative assessment  ? ? ? ? ?

## 2021-07-27 NOTE — Transfer of Care (Signed)
Immediate Anesthesia Transfer of Care Note ? ?Patient: Travis Long ? ?Procedure(s) Performed: LAPAROSCOPIC INSERTION CONTINUOUS AMBULATORY PERITONEAL DIALYSIS  (CAPD) CATHETER ? ?Patient Location: PACU ? ?Anesthesia Type:General ? ?Level of Consciousness: awake and alert  ? ?Airway & Oxygen Therapy: Patient Spontanous Breathing and Patient connected to nasal cannula oxygen ? ?Post-op Assessment: Report given to RN and Post -op Vital signs reviewed and stable ? ?Post vital signs: Reviewed and stable ? ?Last Vitals:  ?Vitals Value Taken Time  ?BP 149/97 07/27/21 1445  ?Temp    ?Pulse 89 07/27/21 1447  ?Resp 24 07/27/21 1447  ?SpO2 100 % 07/27/21 1447  ?Vitals shown include unvalidated device data. ? ?Last Pain:  ?Vitals:  ? 07/27/21 1146  ?TempSrc: Temporal  ?PainSc: 0-No pain  ?   ? ?  ? ?Complications: No notable events documented. ?

## 2021-07-27 NOTE — Op Note (Signed)
?Procedure Date:  07/27/2021 ? ?Pre-operative Diagnosis:  ESRD on dialysis ? ?Post-operative Diagnosis:  ESRD on dialysis ? ?Procedure:  Laparoscopic peritoneal dialysis catheter placement ? ?Surgeon:  Melvyn Neth, MD ? ?Anesthesia:  General endotracheal ? ?Estimated Blood Loss:  10 ml ? ?Specimens:  None ? ?Complications:  None ? ?Indications for Procedure:  This is a 36 y.o. male who presents with ESRD requiring dialysis.  The patient has opted for peritoneal dialysis and presents for catheter placement today.  The risks of bleeding, abscess or infection, potential for an open procedure, catheter failure, and injury to surrounding structures were all discussed with the patient and he was willing to proceed. ? ?Description of Procedure: ?The patient was correctly identified in the preoperative area and brought into the operating room.  The patient was placed supine with VTE prophylaxis in place.  Appropriate time-outs were performed.  Anesthesia was induced and the patient was intubated.  Appropriate antibiotics were infused. ? ?The abdomen was prepped and draped in a sterile fashion.  A peritoneal dialysis catheter with extension tubing were brought into the field and measurements were made for appropriate placement and location of the catheter and components in the patient's left abdominal wall.  A 2.5 cm incision was made at the planned entry location overlying the left rectus muscle, and cautery was used to dissect down the subcutaneous tissue to reach the anterior rectus sheath.  The anterior sheath was incised.  An incision in the right upper quadrant was made and a Veress needle was introduced and pneumoperitoneum was established with appropriate opening pressures.  An additional incision was made in the right lower quadrant and Optiview technique was used to introduce a 5 mm laparoscopic port under visualization.  Then, an additional 5 mm port was placed at the location of the Veress needle.  There was  no bowel injury doing this.  The abdomen was inspected and there were no hernias or adhesions that would prevent placement of the catheter.  Then, a long 5 mm port was introduced through the rectus sheath incision and pushed to the posterior sheath and tunneled inferiorly for about 5-7 cm and then pushed through the peritoneum.  The dialysis catheter was passed through the port and a laparoscopic grasper was used to place the coiled portion of the catheter in the pelvis.  The catheter was placed so that the first cuff was at the edge of the anterior rectus sheath.  At this point, pneumoperitoneum was released.  The rectus sheath was closed around the catheter cuff, and the catheter was then sized with the extension tubing for appropriate tunneling and exit in the left abdomen.  The catheter and extension tubing were connected using a titanium connector, and then the catheter was tunneled to the left upper quadrant via an additional incision and then tunneled inferiorly to it's planned exit site, about 4 cm distal to the last cuff.  There were no complications doing this, and the catheter was maintained straight without kinks.  Then the connectors were placed and the catheter was tested for flow/drainage using heparinized saline.  It had good flow both in and out.  With the catheter in place, 30 ml of local anesthetic was infiltrated onto the rectus sheath, and all incisions.  The incisions were closed in layers using 3-0 Vicryl and 4-0 Monocryl.  The wounds were cleaned and sealed with DermaBond.  A Biopatch was placed at the catheter exit site and the catheter was dressed with 4x4 gauze and  porous tape. ? ?The patient was emerged from anesthesia and extubated and brought to the recovery room for further management. ? ?The patient tolerated the procedure well and all counts were correct at the end of the case. ? ? ?Melvyn Neth, MD  ?

## 2021-07-28 ENCOUNTER — Encounter: Payer: Self-pay | Admitting: Surgery

## 2021-08-01 ENCOUNTER — Encounter: Payer: Self-pay | Admitting: Surgery

## 2021-08-01 MED ORDER — SODIUM CHLORIDE 0.9 % IR SOLN: Status: DC | PRN

## 2021-08-10 ENCOUNTER — Encounter: Payer: Medicare Other | Admitting: Physician Assistant

## 2021-09-21 HISTORY — PX: THORACOTOMY: SUR1349

## 2021-09-21 HISTORY — PX: THORACOTOMY / DECORTICATION PARIETAL PLEURA: SUR1350

## 2021-11-17 ENCOUNTER — Ambulatory Visit: Payer: Medicare Other | Admitting: Surgery

## 2022-01-17 ENCOUNTER — Ambulatory Visit: Payer: Medicare Other | Admitting: Surgery

## 2022-01-24 ENCOUNTER — Encounter: Payer: Self-pay | Admitting: Surgery

## 2022-01-24 ENCOUNTER — Ambulatory Visit (INDEPENDENT_AMBULATORY_CARE_PROVIDER_SITE_OTHER): Payer: Medicare Other | Admitting: Surgery

## 2022-01-24 ENCOUNTER — Telehealth: Payer: Self-pay | Admitting: Surgery

## 2022-01-24 VITALS — BP 168/96 | HR 88 | Temp 98.0°F | Ht 68.0 in | Wt 166.0 lb

## 2022-01-24 DIAGNOSIS — Z4901 Encounter for fitting and adjustment of extracorporeal dialysis catheter: Secondary | ICD-10-CM

## 2022-01-24 DIAGNOSIS — T85611A Breakdown (mechanical) of intraperitoneal dialysis catheter, initial encounter: Secondary | ICD-10-CM

## 2022-01-24 NOTE — H&P (View-Only) (Signed)
01/24/2022  History of Present Illness: Travis Long is a 36 y.o. male s/p laparoscopic assisted insertion of peritoneal dialysis catheter on 07/27/21.  Afterwards, the patient had worsening pulmonary issues with his peritoneal dialysis and needed eventual left thoracotomy in June 2023.  The peritoneal dialysis was creating too much fluid in his abdomen that was worsening his effusion.  Due to this, he has transitioned again to hemodialysis and we have been asked to removed his PD catheter.  Has not had any issues with abdominal pain, and his HD temp cath is working well.  Past Medical History: Past Medical History:  Diagnosis Date   Acute pulmonary edema (HCC)    Acute respiratory failure with hypoxia (HCC)    Anemia of chronic renal failure    a.) receiving epoetin alfa (EPOGEN) injections   CAP (community acquired pneumonia)    Diastolic dysfunction    a.) TTE 03/09/2021: EF 60-65%; sev LVH; GLS -8.5%; mild LA dilation; mild TR/PR; G2DD.   Dyspnea on exertion    ESRD on hemodialysis [facility])    a.) banned by all [facility] owned/operated dialysis centers due to "behavioral issues"; [facility] provider petitioned for him to be re-accepted, but was denied. b.) currently on HD with a M-W-F schedule; plans are to transition to PD once catheter placed in 07/2021. c.) has been seen by [facility] renal transplant service; rejected due to medical non-compliance.   GERD (gastroesophageal reflux disease)    Hypertension    Major depression    Marijuana use    Medically noncompliant    a.) does not take antihypertensives as Rx. b.) misses scheduled HD treatments. c.) refuses medically necessary interventions as recommended by his providers (i.e. blood transfusions). d.) involuntarily committed in 02/2021 due attempts to sign out AMA during acute illness; psychiatry consulted. e.) rejected by [facility] for consideration of renal transplant due to past non-compliance.   Metabolic encephalopathy    On supplemental  oxygen by nasal cannula    a.) 1-3 L/[place] on a PRN basis; mostly used with exertion   Procedural agitation 03/2021   a.) post-procedural agitation following IJ catheter placement in 03/2021; occurred in setting of significant hypoxia, metabolic encephalopathy, and procedural sedation; states, "I was tied down and it caused me to spazz out".   Recurrent pleural effusion    a.) s/p multiple thoracenteses with (+) significant fluid yields: 2130 cc LEFT (05/12/2020), 1030 cc RIGHT (05/14/2020), 1200 cc LEFT 11/30/2020, 1200 cc RIGHT (12/01/2020), 750 cc LEFT (03/06/2021), 850 cc (03/08/2021). b.) 03/08/2021 -->  LEFT 14 Fr pigtail catheter to PleurEvac placed. c.) empyema developed requiring upsizing of chest tube on 03/09/2021; up-sized to 16 Fr.   Tobacco use      Past Surgical History: Past Surgical History:  Procedure Laterality Date   A/V FISTULAGRAM Left 03/09/2021   Procedure: A/V Fistulagram;  Surgeon: Algernon Huxley, MD;  Location: [place] [facility];  Service: Cardiovascular;  Laterality: Left;   A/V FISTULAGRAM Left 07/06/2021   Procedure: A/V Fistulagram;  Surgeon: Algernon Huxley, MD;  Location: [place] [facility];  Service: Cardiovascular;  Laterality: Left;   AV FISTULA PLACEMENT Left    CAPD INSERTION N/A 07/27/2021   Procedure: LAPAROSCOPIC INSERTION CONTINUOUS AMBULATORY PERITONEAL DIALYSIS  (CAPD) CATHETER;  Surgeon: Olean Ree, MD;  Location: [facility] ORS;  Service: General;  Laterality: N/A;   DIALYSIS/PERMA CATHETER INSERTION N/A 04/18/2021   Procedure: DIALYSIS/PERMA CATHETER INSERTION;  Surgeon: Katha Cabal, MD;  Location: [place] [facility];  Service: Cardiovascular;  Laterality: N/A;   IR CATHETER TUBE CHANGE  03/09/2021    Home Medications: Prior to Admission medications   Medication Sig Start Date End Date Taking? Authorizing Provider  acetaminophen (TYLENOL) 500 MG tablet Take 2 tablets (1,000 mg total) by mouth every 6 (six) hours as needed for mild pain. 07/27/21   Yes Metta Koranda, Jacqulyn Bath, MD  amLODipine (NORVASC) 10 MG tablet Take 1 tablet (10 mg total) by mouth daily. 12/02/20  Yes Sharen Hones, MD  calcium acetate (PHOSLO) 667 MG capsule Take 2 capsules (1,334 mg total) by mouth 3 (three) times daily with meals. 03/10/21  Yes Nicole Kindred A, DO  carvedilol (COREG) 25 MG tablet Take 1 tablet (25 mg total) by mouth 2 (two) times daily with a meal. 03/15/21  Yes Gonfa, Charlesetta Ivory, MD  dicyclomine (BENTYL) 10 MG capsule Take 1 capsule (10 mg total) by mouth every 6 (six) hours as needed (nausea, emesis). 03/15/21  Yes Mercy Riding, MD  hydrALAZINE (APRESOLINE) 100 MG tablet Take 1 tablet (100 mg total) by mouth every 8 (eight) hours. 03/15/21  Yes Mercy Riding, MD  minoxidil (LONITEN) 2.5 MG tablet Take 2 tablets (5 mg total) by mouth daily. 12/03/20  Yes Sharen Hones, MD  pantoprazole (PROTONIX) 40 MG tablet Take 1 tablet (40 mg total) by mouth daily. 03/15/21  Yes Mercy Riding, MD  OXYGEN Inhale 1 L into the lungs.    [provider]    Allergies: No Known Allergies  Review of Systems: Review of Systems  Constitutional:  Negative for chills and fever.  HENT:  Negative for hearing loss.   Respiratory:  Negative for shortness of breath.   Cardiovascular:  Negative for chest pain.  Gastrointestinal:  Negative for abdominal pain, nausea and vomiting.  Genitourinary:  Negative for dysuria.  Musculoskeletal:  Negative for myalgias.  Skin:  Negative for rash.  Neurological:  Negative for dizziness.  Psychiatric/Behavioral:  Negative for depression.     Physical Exam BP (!) 168/96   Pulse 88   Temp 98 F (36.7 C)   Ht 5\' 8"  (1.727 m)   Wt 166 lb (75.3 kg)   SpO2 95%   BMI 25.24 kg/m  CONSTITUTIONAL: No acute distress, well nourished. HEENT:  Normocephalic, atraumatic, extraocular motion intact. RESPIRATORY:  Lungs sounds are diminished bilaterally. Normal respiratory effort without pathologic use of accessory muscles.  Using 1L  [place]. CARDIOVASCULAR: Heart is regular without murmurs, gallops, or rubs. GI: The abdomen is soft, non-distended, non-tender.  PD catheter in place, without any evidence of infection.  Incisions well healed. NEUROLOGIC:  Motor and sensation is grossly normal.  Cranial nerves are grossly intact. PSYCH:  Alert and oriented to person, place and time. Affect is normal.  Assessment and Plan: This is a 36 y.o. male with complication from PD dialysis, now needing catheter removal.  --Discussed with the patient that we can remove the PD catheter.  Confirmed with him that his temp cath for HD is working and he's doing dialysis twice a week.  Discussed with him the surgery at length including the incisions, the possible use of laparoscopy to evaluate the internal portion of the catheter, the risks of bleeding, infection, injury to surrounding structures, post-op activity restrictions, and he's willing to proceed. --Will schedule him for 01/31/22.  I spent 40 minutes dedicated to the care of this patient on the date of this encounter to include pre-visit review of records, face-to-face time with the patient discussing diagnosis and management, and any  post-visit coordination of care.   Melvyn Neth, [facility]

## 2022-01-24 NOTE — Patient Instructions (Signed)
Our surgery scheduler will call you within 24-48 hours to schedule your surgery. Please have the Blue surgery sheet available when speaking with her.  

## 2022-01-24 NOTE — Progress Notes (Unsigned)
01/24/2022  History of Present Illness: Travis Long is a 36 y.o. male s/p laparoscopic assisted insertion of peritoneal dialysis catheter on 07/27/21.  Afterwards, the patient had worsening pulmonary issues with his peritoneal dialysis and needed eventual left thoracotomy in June 2023.  The peritoneal dialysis was creating too much fluid in his abdomen that was worsening his effusion.  Due to this, he has transitioned again to hemodialysis and we have been asked to removed his PD catheter.  Has not had any issues with abdominal pain, and his HD temp cath is working well.  Past Medical History: Past Medical History:  Diagnosis Date   Acute pulmonary edema (HCC)    Acute respiratory failure with hypoxia (HCC)    Anemia of chronic renal failure    a.) receiving epoetin alfa (EPOGEN) injections   CAP (community acquired pneumonia)    Diastolic dysfunction    a.) TTE 03/09/2021: EF 60-65%; sev LVH; GLS -8.5%; mild LA dilation; mild TR/PR; G2DD.   Dyspnea on exertion    ESRD on hemodialysis [facility])    a.) banned by all [facility] owned/operated dialysis centers due to "behavioral issues"; [facility] provider petitioned for him to be re-accepted, but was denied. b.) currently on HD with a M-W-F schedule; plans are to transition to PD once catheter placed in 07/2021. c.) has been seen by [facility] renal transplant service; rejected due to medical non-compliance.   GERD (gastroesophageal reflux disease)    Hypertension    Major depression    Marijuana use    Medically noncompliant    a.) does not take antihypertensives as Rx. b.) misses scheduled HD treatments. c.) refuses medically necessary interventions as recommended by his providers (i.e. blood transfusions). d.) involuntarily committed in 02/2021 due attempts to sign out AMA during acute illness; psychiatry consulted. e.) rejected by [facility] for consideration of renal transplant due to past non-compliance.   Metabolic encephalopathy    On supplemental  oxygen by nasal cannula    a.) 1-3 L/[place] on a PRN basis; mostly used with exertion   Procedural agitation 03/2021   a.) post-procedural agitation following IJ catheter placement in 03/2021; occurred in setting of significant hypoxia, metabolic encephalopathy, and procedural sedation; states, "I was tied down and it caused me to spazz out".   Recurrent pleural effusion    a.) s/p multiple thoracenteses with (+) significant fluid yields: 2130 cc LEFT (05/12/2020), 1030 cc RIGHT (05/14/2020), 1200 cc LEFT 11/30/2020, 1200 cc RIGHT (12/01/2020), 750 cc LEFT (03/06/2021), 850 cc (03/08/2021). b.) 03/08/2021 -->  LEFT 14 Fr pigtail catheter to PleurEvac placed. c.) empyema developed requiring upsizing of chest tube on 03/09/2021; up-sized to 16 Fr.   Tobacco use      Past Surgical History: Past Surgical History:  Procedure Laterality Date   A/V FISTULAGRAM Left 03/09/2021   Procedure: A/V Fistulagram;  Surgeon: Algernon Huxley, MD;  Location: [place] [facility];  Service: Cardiovascular;  Laterality: Left;   A/V FISTULAGRAM Left 07/06/2021   Procedure: A/V Fistulagram;  Surgeon: Algernon Huxley, MD;  Location: [place] [facility];  Service: Cardiovascular;  Laterality: Left;   AV FISTULA PLACEMENT Left    CAPD INSERTION N/A 07/27/2021   Procedure: LAPAROSCOPIC INSERTION CONTINUOUS AMBULATORY PERITONEAL DIALYSIS  (CAPD) CATHETER;  Surgeon: Olean Ree, MD;  Location: [facility] ORS;  Service: General;  Laterality: N/A;   DIALYSIS/PERMA CATHETER INSERTION N/A 04/18/2021   Procedure: DIALYSIS/PERMA CATHETER INSERTION;  Surgeon: Katha Cabal, MD;  Location: [place] [facility];  Service: Cardiovascular;  Laterality: N/A;   IR CATHETER TUBE CHANGE  03/09/2021    Home Medications: Prior to Admission medications   Medication Sig Start Date End Date Taking? Authorizing Provider  acetaminophen (TYLENOL) 500 MG tablet Take 2 tablets (1,000 mg total) by mouth every 6 (six) hours as needed for mild pain. 07/27/21   Yes Aldean Suddeth, Jacqulyn Bath, MD  amLODipine (NORVASC) 10 MG tablet Take 1 tablet (10 mg total) by mouth daily. 12/02/20  Yes Sharen Hones, MD  calcium acetate (PHOSLO) 667 MG capsule Take 2 capsules (1,334 mg total) by mouth 3 (three) times daily with meals. 03/10/21  Yes Nicole Kindred A, DO  carvedilol (COREG) 25 MG tablet Take 1 tablet (25 mg total) by mouth 2 (two) times daily with a meal. 03/15/21  Yes Gonfa, Charlesetta Ivory, MD  dicyclomine (BENTYL) 10 MG capsule Take 1 capsule (10 mg total) by mouth every 6 (six) hours as needed (nausea, emesis). 03/15/21  Yes Mercy Riding, MD  hydrALAZINE (APRESOLINE) 100 MG tablet Take 1 tablet (100 mg total) by mouth every 8 (eight) hours. 03/15/21  Yes Mercy Riding, MD  minoxidil (LONITEN) 2.5 MG tablet Take 2 tablets (5 mg total) by mouth daily. 12/03/20  Yes Sharen Hones, MD  pantoprazole (PROTONIX) 40 MG tablet Take 1 tablet (40 mg total) by mouth daily. 03/15/21  Yes Mercy Riding, MD  OXYGEN Inhale 1 L into the lungs.    [provider]    Allergies: No Known Allergies  Review of Systems: Review of Systems  Constitutional:  Negative for chills and fever.  HENT:  Negative for hearing loss.   Respiratory:  Negative for shortness of breath.   Cardiovascular:  Negative for chest pain.  Gastrointestinal:  Negative for abdominal pain, nausea and vomiting.  Genitourinary:  Negative for dysuria.  Musculoskeletal:  Negative for myalgias.  Skin:  Negative for rash.  Neurological:  Negative for dizziness.  Psychiatric/Behavioral:  Negative for depression.     Physical Exam BP (!) 168/96   Pulse 88   Temp 98 F (36.7 C)   Ht 5\' 8"  (1.727 m)   Wt 166 lb (75.3 kg)   SpO2 95%   BMI 25.24 kg/m  CONSTITUTIONAL: No acute distress, well nourished. HEENT:  Normocephalic, atraumatic, extraocular motion intact. RESPIRATORY:  Lungs sounds are diminished bilaterally. Normal respiratory effort without pathologic use of accessory muscles.  Using 1L  [place]. CARDIOVASCULAR: Heart is regular without murmurs, gallops, or rubs. GI: The abdomen is soft, non-distended, non-tender.  PD catheter in place, without any evidence of infection.  Incisions well healed. NEUROLOGIC:  Motor and sensation is grossly normal.  Cranial nerves are grossly intact. PSYCH:  Alert and oriented to person, place and time. Affect is normal.  Assessment and Plan: This is a 36 y.o. male with complication from PD dialysis, now needing catheter removal.  --Discussed with the patient that we can remove the PD catheter.  Confirmed with him that his temp cath for HD is working and he's doing dialysis twice a week.  Discussed with him the surgery at length including the incisions, the possible use of laparoscopy to evaluate the internal portion of the catheter, the risks of bleeding, infection, injury to surrounding structures, post-op activity restrictions, and he's willing to proceed. --Will schedule him for 01/31/22.  I spent 40 minutes dedicated to the care of this patient on the date of this encounter to include pre-visit review of records, face-to-face time with the patient discussing diagnosis and management, and any  post-visit coordination of care.   Melvyn Neth, [facility]

## 2022-01-24 NOTE — Telephone Encounter (Signed)
Left message for patient to call, please inform him of the following regarding scheduled surgery.   Pre-Admission date/time, and Surgery date at [facility].  Surgery Date: 01/31/22 Preadmission Testing Date: 01/25/22 (phone 1p-5p)  Also patient to call at [phone], between 1-3:00pm the day before surgery, to find out what time to arrive for surgery.

## 2022-01-25 ENCOUNTER — Inpatient Hospital Stay
Admission: RE | Admit: 2022-01-25 | Discharge: 2022-01-25 | Disposition: A | Discharge status: Home / Self Care | Payer: Medicare Other | Source: Ambulatory Visit

## 2022-01-25 HISTORY — DX: Unspecified diastolic (congestive) heart failure: I50.30

## 2022-01-25 NOTE — Telephone Encounter (Signed)
Left another message both numbers for patient to call.

## 2022-01-25 NOTE — Telephone Encounter (Signed)
Patient calls back, he expressed no interest in the information I was trying to give him regarding his surgery, was short with me.  I barely was able to get the information in and he hung up.

## 2022-01-26 ENCOUNTER — Encounter
Admission: RE | Admit: 2022-01-26 | Discharge: 2022-01-26 | Disposition: A | Discharge status: Home / Self Care | Payer: Medicare Other | Source: Ambulatory Visit | Attending: Surgery | Admitting: Surgery

## 2022-01-26 DIAGNOSIS — Z01812 Encounter for preprocedural laboratory examination: Secondary | ICD-10-CM

## 2022-01-26 DIAGNOSIS — Z01818 Encounter for other preprocedural examination: Secondary | ICD-10-CM

## 2022-01-26 NOTE — Pre-Procedure Instructions (Signed)
Called pt to do his anesthesia interview. Pt lives in [place] and does not have my chart so instructions were reviewed verbally over phone and also emailed to pt at [email or web address]

## 2022-01-26 NOTE — Patient Instructions (Addendum)
Your procedure is scheduled on:01-31-22 Wednesday Report to the Registration Desk on the 1st floor of the [facility].Then proceed to the 2nd floor Surgery Desk To find out your arrival time, please call [phone] between 1PM - 3PM on:01-30-22 Tuesday If your arrival time is 6:00 am, do not arrive prior to that time as the [facility] entrance doors do not open until 6:00 am.  REMEMBER: Instructions that are not followed completely may result in serious medical risk, up to and including death; or upon the discretion of your surgeon and anesthesiologist your surgery may need to be rescheduled.  Do not eat food after midnight the night before surgery.  No gum chewing, lozengers or hard candies.  You may however, drink CLEAR liquids up to 2 hours before you are scheduled to arrive for your surgery. Do not drink anything within 2 hours of your scheduled arrival time.  Clear liquids include: - water  - apple juice without pulp - gatorade (not RED colors) - black coffee or tea (Do NOT add milk or creamers to the coffee or tea) Do NOT drink anything that is not on this list.  TAKE THESE MEDICATIONS THE MORNING OF SURGERY WITH A SIP OF WATER: -amLODipine (NORVASC)  -carvedilol (COREG)  -hydrALAZINE (APRESOLINE)  -minoxidil (LONITEN)   One week prior to surgery: Stop Anti-inflammatories (NSAIDS) such as Advil, Aleve, Ibuprofen, Motrin, Naproxen, Naprosyn and Aspirin based products such as Excedrin, Goodys Powder, BC Powder.You may however, continue to take Tylenol if needed for pain up until the day of surgery.  Stop ANY OVER THE COUNTER supplements/vitamins NOW (01-26-22) until after surgery.  No Alcohol for 24 hours before or after surgery.  No Smoking including e-cigarettes for 24 hours prior to surgery.  No chewable tobacco products for at least 6 hours prior to surgery.  No nicotine patches on the day of surgery.  Do not use any "recreational" drugs for at least a week prior to  your surgery.  Please be advised that the combination of cocaine and anesthesia may have negative outcomes, up to and including death. If you test positive for cocaine, your surgery will be cancelled.  On the morning of surgery brush your teeth with toothpaste and water, you may rinse your mouth with mouthwash if you wish. Do not swallow any toothpaste or mouthwash.  Do not wear jewelry, make-up, hairpins, clips or nail polish.  Do not wear lotions, powders, or perfumes.   Do not shave body from the neck down 48 hours prior to surgery just in case you cut yourself which could leave a site for infection.  Also, freshly shaved skin may become irritated if using the CHG soap.  Contact lenses, hearing aids and dentures may not be worn into surgery.  Do not bring valuables to the hospital. [facility] is not responsible for any missing/lost belongings or valuables.   Notify your doctor if there is any change in your medical condition (cold, fever, infection).  Wear comfortable clothing (specific to your surgery type) to the hospital.  After surgery, you can help prevent lung complications by doing breathing exercises.  Take deep breaths and cough every 1-2 hours. Your doctor may order a device called an Incentive Spirometer to help you take deep breaths. When coughing or sneezing, hold a pillow firmly against your incision with both hands. This is called "splinting." Doing this helps protect your incision. It also decreases belly discomfort.  If you are being admitted to the hospital overnight, leave your suitcase  in the car. After surgery it may be brought to your room.  If you are being discharged the day of surgery, you will not be allowed to drive home. You will need a responsible adult (18 years or older) to drive you home and stay with you that night.   If you are taking public transportation, you will need to have a responsible adult (18 years or older) with you. Please confirm  with your physician that it is acceptable to use public transportation.   Please call the [facility] at [phone] if you have any questions about these instructions.  Surgery Visitation Policy:  Patients undergoing a surgery or procedure may have two family members or support persons with them as long as the person is not COVID-19 positive or experiencing its symptoms.

## 2022-01-29 ENCOUNTER — Encounter: Payer: Self-pay | Admitting: Urgent Care

## 2022-01-29 NOTE — Progress Notes (Signed)
Perioperative Services  Pre-Admission/Anesthesia Testing Clinical Review  Date: 01/30/22  Patient Demographics:  Name: Travis Long DOB:   04-Apr-1985 MRN:   [id number]  Planned Surgical Procedure(s):    Case: [id number] Date/Time: 01/31/22 0715   Procedure: LAPAROSCOPIC REMOVAL CONTINUOUS AMBULATORY PERITONEAL DIALYSIS  (CAPD) CATHETER; POSSIBLE LAPAROSCOPY   Anesthesia type: General   Pre-op diagnosis: PD catheter malfunction   Location: [facility] OR ROOM 02 / [place] [facility]S FOR ANESTHESIA GROUP   Surgeons: Olean Ree, MD   NOTE: Available PAT nursing documentation and vital signs have been reviewed. Clinical nursing staff has updated patient's PMH/PSHx, current medication list, and drug allergies/intolerances to ensure comprehensive history available to assist in medical decision making as it pertains to the aforementioned surgical procedure and anticipated anesthetic course. Extensive review of available clinical information performed. [place] PMH and PSHx updated with any diagnoses/procedures that  may have been inadvertently omitted during his intake with the pre-admission testing department's nursing staff.  Clinical Discussion:  [name] is a 36 y.o. male who is submitted for pre-surgical anesthesia review and clearance prior to him undergoing the above procedure. Patient is a Former Smoker (quit 09/2019). 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   Patient on hemodialysis on Monday, Wednesday, and Friday. Notes indicate that due to "behavioral issues" while at [facility],  he has been  banned from receiving dialysis services near his home in [place], [place]. Patient has been banned by all [facility] owned/operated dialysis centers. Provider at [facility] petitioned to have patient reaccepted for dialysis within the [facility] system, however petition was denied. Of note, patient ultimately desires to undergo renal transplant. With that being said, he has been denied renal transplant at [facility] due to a documented history of medical non-compliance. Patient is receiving his dialysis at [facility] here in [place], [place].   Patient with a complicated medical history requiring multiple hospitalizations:    ADMISSION (04/27/2020 - 05/05/2020) --> Patient admitted to at [facility] when he was found to have pneumonia. He was treated with IV antibiotics. Patient received dialysis services during his admission. During course of treatment, patient had disagreement with nephrologist and communicated violent threats. He ultimately signed out AMA on 05/05/2020.   ADMISSION (05/08/2020 - 05/18/2020) --> Patient presented to [facility] for increased shortness of breath. Patient unable to receive dialysis services in the [facility] system, thus he presented in volume overload. Patient found to be anemic during admission; Hgb levels as low as 6.1 gm/dL. Transfusion was recommended, however patient declined. Patient did agree to treatment with IV iron infusions and EPO injections. CT imaging revealed moderate to large BILATERAL pleural effusions that were stable to slightly increased on the LEFT. Mild dependent adjacent compressive atelectasis noted. Patient consented to BILATERAL thoracenteses; 2130 cc yield from the LEFT and 1030 cc from the RIGHT. Dialysis services resumed during admission. Patient was stabilized. Care management became involved due to issues with patient's inability to receive outpatient dialysis services. They were able to  get patient accepted to the nephrology service in [place]. Patient discharged home in stable condition on 05/18/2020 with plans for outpatient follow up with Dr. Anthonette Legato, MD in [place],  [place].   ADMISSION (11/30/2020 - 12/02/2020) --> Patient presented to the ED at [facility] hypoxic and signs of fluid overload. Oxygen saturations in the low 80s despite hemodialysis treatment earlier in the day removing approximately 4 L of fluid.  CXR revealed a significant LEFT pleural effusion as well as pulmonary edema.  Patient reportedly noncompliant with prescribed antihypertensives; blood pressure documented at 220/145 mmHg.  Patient underwent BILATERAL thoracenteses with 1.2 L being removed from each lung. He was dialyzed during his admission. Blood pressured normalized. Patient discharged home in stable condition on 12/02/2020 with plans for outpatient follow up with nephrology.   ADMISSION (03/06/2021 - 03/10/2021) --> Patient presented to the ED with complaints of worsening exertional dyspnea in the setting of known BILATERAL pleural effusions.  Patient reported that he had not been taking his antihypertensive medications because they were in an upstairs bedroom, and due to his shortness of breath, he was unable to reach them.  Patient missed scheduled dialysis treatment that day.  Presenting blood pressure 207/129 mmHg. Hospital course complicated by acute metabolic encephalopathy.  Patient wanted to sign out AMA and ultimately had to be placed under involuntary commitment compelling him to stay due to acute health issues. CXR revealed increased opacification of the LEFT hemithorax secondary to recurrent pleural effusion and associated atelectasis.  Patient underwent thoracentesis with a 750 cc fluid yield.  CT of the chest, following thoracentesis, revealed consolidation consistent with pneumonia.  There was loculated parapneumonic pleural fluid consistent with a  developing empyema.  TTE performed on 03/09/2021 showed a normal left ventricular systolic function with severe LVH; LVEF 60-65%.  Diastolic Doppler parameters consistent with pseudonormalization (G2DD); GLS -8.5%.  Patient underwent CT-guided chest tube placement (14 Fr) for empyema evacuation and tPA administration.  850 cc fluid removed during chest tube insertion.  Chest tube became dislodged and required repositioning and subsequent up sizing to 16 Fr. repeat imaging showed progression of hydropneumothorax.  The decision was made to transfer patient to [facility] for CVTS consultation.  ADMISSION (03/10/2021 - 03/15/2021) --> patient admitted to [facility] for ongoing care.  Patient was seen in consult by Dr. Gilford Raid, MD (CVTS).  Etiology of effusion felt to be parapneumonic or CHF.  MD noted that lung appeared to be encased and a "thick peel" and most likely trapped by scar tissue.  Despite multiple thoracenteses, patient's lung failed to reexpand accounting for the a lateral pneumothorax seen on imaging.  CVTS did not feel that it was feasible to perform a LEFT thoracotomy to drain the residual fluid and decorticate the lung.  Per Dr. Cyndia Bent, "procedure would likely result in significant injury to the lung with prolonged air leak, and I do not think that the LEFT lung is going to reexpand.  Patient would likely end up with a chronic chest tube draining, persistent air leaks, and would be worse off than he is now".  Surgeon added that surgical intervention and these cases need to occur within the first few weeks to have any chance of reexpanding the lung.  Patient received ongoing management of acute issues and was ultimately discharged home in stable condition on 03/15/2021 with plans to follow-up with outpatient nephrology and pulmonology.  ADMISSION (04/17/2021 - 04/27/2021) --> patient presented cone [facility] from the hemodialysis center for  worsening shortness of breath.  Patient denied any chest pain.  He was significantly hypertensive at 194/130 mmHg.  CXR showed a large LEFT  pleural effusion despite previous interventions.  Patient presented dyspneic, but not hypoxic felt to be secondary to incomplete hemodialysis treatment. Partial treatment due to malfunction of vascular assess. RIGHT IJ tunneled dialysis catheter placed and patient continued hemodialysis treatments during admission.  Of note, patient with postprocedural agitation in the setting of hypoxia, metabolic encephalopathy, and procedural sedation.  Patient seen in consult by the PCCM during admission.  MD noted increased hydropneumothorax since previous CT imaging performed in 02/2021.  MD reviewed consultation notes from CVTS at [facility]; opting for conservative management.  PCCM provider felt that patient needed a second opinion from [place] [facility].  Per Dr. Mortimer Fries, "there is nothing that PCCM can offer this patient at this time". Patient unable to be weaned from supplemental oxygen during admission; discharged home on 2L/[place].   ADMISSION (09/17/2021 - 10/01/2021) --> patient presented for care at [facility] with acute on chronic hypoxic respiratory failure. Patient with acute pulmonary edema. NIPPV (BiPAP) required. Patient underwent a LEFT thoracentesis with a 330 cc yield with little no improvement. He required urgent hemodialysis. During his admission, patient found to be significantly anemic despite outpatient EPO injection. Hemoglobin as low at 6.3 g/d, requiring transfusion of 2 units of PRBCs. CVTS was consulted. Recommendations from [facility] and [facility] providers reviewed that indicated patient was "not an appropriate surgical candidate due to comorbidities, concern for postoperative medical noncompliance, and oxygen requirements when active".  Given patient's clinical condition, decision was made to move forward with LEFT thoracotomy with lung decortication on 09/21/2021. LEFT  lung found to be "trapped" during the procedure; tolerated procedure well overall. Extubated in ICU to 6L/[place] the following day and eventually moved out to floor with chest tube in place secondary to apical pneumothorax. Imaging of the chest revealed bilateral pulmonary infiltrates concerning for pneumonia, however notes indicated that patient frequently refused PO/IV antibiotics during his admission. Chest tube to water seal on 09/25/2021 and eventually discontinued on 09/27/2021. Patient improved clinically and was ultimately discharged home on 10/01/2021 in stable condition. Of note, patient discharged with course of oral levofloxacin.   Since being discharged from the hospital, blood pressured currently being maintained on oral CCB (amlodipine), beta-blocker (carvedilol), and vasodilator (hydralazine + minoxidil) therapies.  He has required 2 additional units of PRBCs since being discharged (10/10/2021 and 10/27/2021). Continue on EPO injections with nephrology. Last imaging of the chest, abdomen, and pelvis was performed on 10/25/2021 revealing sizable BILATERAL loculated hydropneumothoraces at the lung bases (R >L).  Additionally, there was a small loculated hydropneumothorax present at the LEFT apex.  Pleural collections reported to contain more gas since last imaging.  Imaging demonstrated diffuse pleural thickening on the LEFT.  There were persistent consolidative changes and the RIGHT lower lobe with small areas of consolidation/atelectasis along the periphery contralaterally. Patient with increased oxygen demand.  He is currently using 3-6 L/[place] supplemental oxygen as needed with exertion.  He reports that he does not require oxygen at rest.  Quentin Mulling had PD catheter placed back in 07/2021.  Catheter reported to be causing too much fluid to collect in his abdomen, thus worsening his respiratory status/pleural effusions.  Patient has returned to hemodialysis at this point.  He presented to general  surgery for consultation regarding removal of his PD catheter.  Recent clinical history reviewed by general surgery.  The decision has been made to move forward with [place]  (CAPD) CATHETER AND POSSIBLE LAPAROSCOPY on 01/31/2022 with Dr. Ardath Sax, MD. In  review of his medication reconciliation, it is noted the patient is not currently taking any type of anticoagulation or antiplatelet therapies that need to be held during his perioperative course.  Patient denies previous perioperative complications with anesthesia in the past. In review of the available records, it is noted that patient underwent a general anesthetic course at [facility] in 08/2021 without documented complications.      01/24/2022    9:11 AM 07/27/2021    3:27 PM 07/27/2021    3:15 PM  Vitals with BMI  Height 5\' 8"     Weight 166 lbs    BMI 76.16    Systolic 073 710 626  Diastolic 96 948 546  Pulse 88 85 87    Providers/Specialists:   NOTE: Primary physician provider listed below. Patient may have been seen by APP or partner within same practice.   PROVIDER ROLE / SPECIALTY LAST OV / Blair Hailey, MD General Surgeon (Surgeon) 01/24/2022  Karlene Einstein, PA-C Primary Care Provider 10/09/2021  Ottie Glazier, MD Pulmonary Medicine (inpatient consult only) 04/27/2021  Flora Lipps, MD Pulmonary Medicine (inpatient consult only) 04/17/2021  Wallene Huh, MD Pulmonary Medicine (inpatient consult only) 03/16/2021  Anthonette Legato, MD Nephrology ???  Gilford Raid, MD Cardiothoracic Surgery [facility]) 03/11/2021  Owens Shark, MD Cardiothoracic Surgery ([facility]) 06/27/2021  Adonis Housekeeper. MD Cardiothoracic Surgery ([facility]) 09/21/2021   Allergies:  Patient has no known allergies.  Current Home Medications:   No current facility-administered medications for this encounter.    acetaminophen (TYLENOL) 500 MG tablet   amLODipine  (NORVASC) 10 MG tablet   calcium acetate (PHOSLO) 667 MG capsule   carvedilol (COREG) 25 MG tablet   dicyclomine (BENTYL) 10 MG capsule   hydrALAZINE (APRESOLINE) 100 MG tablet   minoxidil (LONITEN) 2.5 MG tablet   OXYGEN   History:   Past Medical History:  Diagnosis Date   Acute pulmonary edema (HCC)    Acute respiratory failure with hypoxia (HCC)    Anemia of chronic renal failure    a.) receiving epoetin alfa (EPOGEN) injections   CAP (community acquired pneumonia)    Diverticulosis    Dyspnea    Dyspnea on exertion    Encephalopathy    ESRD on hemodialysis ([place])    a.) banned by all [facility] owned/operated dialysis centers due to "behavioral issues"; [facility] provider petitioned for him to be re-accepted, but was denied. b.) currently on HD with a M-W-F schedule; plans are to transition to PD once catheter placed in 07/2021. c.) has been seen by [facility] renal transplant service; rejected due to medical non-compliance.   GERD (gastroesophageal reflux disease)    Gynecomastia    Heart failure with preserved ejection fraction ([place])    a.) TTE 03/09/2021: EF 60-65%; sev LVH; GLS -8.5%; mild LA dilation; mild TR/PR; G2DD.   Hiatal hernia    Hypertension    Major depression    Marijuana use    Medically noncompliant    a.) does not take antihypertensives as Rx. b.) misses scheduled HD Tx; c.) refuses medically necessary interventions as recommended by his providers (i.e. blood transfusions, medications). d.) involuntarily committed in 02/2021 due attempts to sign out AMA during acute illness; psychiatry consulted. e.) rejected by [facility] for consideration of renal transplant due to past non-compliance   Metabolic encephalopathy    On supplemental oxygen by nasal cannula    a.) 4.5-6 L/[place] on a PRN basis; mostly used with exertion   Procedural agitation 03/2021  a.) post-procedural agitation following IJ catheter placement in 03/2021; occurred in setting of significant hypoxia, metabolic  encephalopathy, and procedural sedation; states, "I was tied down and it caused me to spazz out".   Recurrent pleural effusion    a.) s/p mult thoracenteses with (+) sig fluid yields: 2130 cc LEFT (05/12/2020), 1030 cc RIGHT (05/14/2020), 1200 cc LEFT 11/30/2020, 1200 cc RIGHT (12/01/2020), 750 cc LEFT (03/06/2021), 850 cc (03/08/2021), 330 cc LEFT (09/17/2021); b.) 03/08/2021 ->  LEFT 14 Fr pigtail cath to PleurEvac; c.) empyema dev req tube upsizing on 03/09/2021;  d.) s/p LEFT thoracotomy and decortication 09/21/2021   Splenomegaly    Tobacco use    Past Surgical History:  Procedure Laterality Date   A/V FISTULAGRAM Left 03/09/2021   Procedure: A/V Fistulagram;  Surgeon: Algernon Huxley, MD;  Location: [place] CV [facility];  Service: Cardiovascular;  Laterality: Left;   A/V FISTULAGRAM Left 07/06/2021   Procedure: A/V Fistulagram;  Surgeon: Algernon Huxley, MD;  Location: [place] CV [facility];  Service: Cardiovascular;  Laterality: Left;   AV FISTULA PLACEMENT Left    CAPD INSERTION N/A 07/27/2021   Procedure: LAPAROSCOPIC INSERTION CONTINUOUS AMBULATORY PERITONEAL DIALYSIS  (CAPD) CATHETER;  Surgeon: Olean Ree, MD;  Location: [facility] ORS;  Service: General;  Laterality: N/A;   DIALYSIS/PERMA CATHETER INSERTION N/A 04/18/2021   Procedure: DIALYSIS/PERMA CATHETER INSERTION;  Surgeon: Katha Cabal, MD;  Location: [place] [facility];  Service: Cardiovascular;  Laterality: N/A;   IR CATHETER TUBE CHANGE  03/09/2021   THORACOTOMY / DECORTICATION PARIETAL PLEURA Left 09/21/2021   Procedure: THORACOTOMY / DECORTICATION PARIETAL PLEURA; CRYO ABLATION; Location: [facility]; Surgeon: Adonis Housekeeper, MD   Family History  Problem Relation Age of Onset   Hypertension Mother    Diabetes Mellitus II Mother    Social History   Tobacco Use   Smoking status: Former    Types: Cigarettes    Quit date: 09/2019    Years since quitting: 2.3    Passive exposure: Past   Smokeless tobacco: Never   Vaping Use   Vaping Use: Never used  Substance Use Topics   Alcohol use: Not Currently   Drug use: Never    Pertinent Clinical Results:  LABS: Labs reviewed: Acceptable for surgery.  Lab Results  Component Value Date   WBC 10.7 (H) 04/26/2021   HGB 11.2 (L) 07/27/2021   HCT 33.0 (L) 07/27/2021   MCV 94.2 04/26/2021   PLT 315 04/26/2021   Lab Results  Component Value Date   NA 134 (L) 07/27/2021   K 4.5 07/27/2021   CO2 32 04/26/2021   GLUCOSE 92 07/27/2021   BUN 42 (H) 07/27/2021   CREATININE 11.80 (H) 07/27/2021   CALCIUM 8.7 (L) 04/26/2021   GFRNONAA 4 (L) 04/26/2021    ECG: Date: 10/25/2021 Time ECG obtained: 0744 AM Rate: 91 bpm Rhythm: SR with 1st degree AVB; non-specific IVCD Axis (leads I and aVF): Right axis deviation Intervals: PR 248 ms. QRS 158 ms. QTc 473 ms. ST segment and T wave changes: No evidence of acute ST segment elevation or depression. Comparison:previous inferolateral ST and T wave changes have resolved.  NOTE: Tracing obtained at [facility]; unable for review. Above based on cardiologist's interpretation.    IMAGING / PROCEDURES: CT CHEST, ABDOMEN, PELVIS WITH IV CONTRAST performed on 10/25/2021 Redemonstration of sizable bilateral loculated hydropneumothoraces at the lung bases, right greater than left. Small loculated hydropneumothorax also present at the left apex. While the overall size of the loculated pleural  collections appears relatively unchanged compared to the prior CT chest 10/14/2021, they contain more gas.  Similar diffuse pleural thickening present on the left.  Similar consolidative changes right lower lobe.  Similar small scattered areas of consolidation/atelectasis along the periphery of the left lung.  Similar mediastinal adenopathy.  Suspect fatty infiltration.  Similar mild splenomegaly.  Severely atrophic kidneys are noted no hydronephrosis.  There is a dialysis catheter in place.  Moderate hiatal hernia.  Colonic  diverticulosis.  Gynecomastia.   PULMONARY FUNCTION TESTING performed on 06/27/2021   Ref Range & Units 06/27/2021  FVC Pre L 1.16   FEV1 Pre L 1.03   FEV1/FVC Pre % 89.44   FEF25-75% Pre L/s 1.41   PEF Pre L/s 4.33   FVC_LLN  3.34   FVC_Z-SCORE  -5.71   FVC_%PRED % 27 %   FVC_Z-SCOREGRAPH  -5.71   FEV1_LLN  2.71   FEV1_Z-SCORE  -4.74   FEV1_%PRED % 29 %   FEV1_Z-SCOREGRAPH  -4.74   FEV1/FVC_LLN  73   FEF25-75%_LLN  2.00   FEF25-75%_Z-SCORE  -2.40   FEF25-75%_%PRED % 39 %   FEF25-75%_Z-SCOREGRAPH  -2.40   PEF_LLN  6.55   PEF_Z-SCORE  -3.10   PEF_%PRED % 48 %   PEF_Z-SCOREGRAPH  -3.10   Resulting Agency  [facility]  Specimen Collected: 06/27/21 10:39 Last Resulted: 06/28/21 14:06  Received From: [facility]  Result Received: 07/05/21 15:40   DIAGNOSTIC RADIOGRAPHS OF CHEST 2 VIEWS performed on 04/27/2021 Heart size difficult to assess due to adjacent opacities. Large LEFT and moderate RIGHT pleural effusions as before.  Extensive consolidation/atelectasis throughout much of the LEFT lung, and the RIGHT lung base.  Stable tunneled RIGHT IJ hemodialysis catheter to the cavoatrial junction.  CT CHEST WITHOUT CONTRAST performed on 04/17/2021 Compared to 03/06/2021, there is interval increase in the fluid and air components of a moderate LEFT hydropneumothorax.  Interval mild increase in moderate BILATERAL pleural effusions. There again appear to be multiple loculations within the hydropneumothorax, also increased from prior. There is again high-grade consolidation of the posterior LEFT lower lobe either pneumonia versus round atelectasis. Note is made of prior 03/09/2021 basilar pleural pigtail catheter placement in the region of this hydropneumothorax on prior 03/09/2021 radiographs. Following removal of this pigtail catheter, the extrapleural air and fluid appears to have increased over time.   Impression and Plan:  Mannie Ohlin has been  referred for pre-anesthesia review and clearance prior to him undergoing the planned anesthetic and procedural courses. Available labs, pertinent testing, and imaging results were personally reviewed by me. Interdisciplinary input has solicited and reviewed. Consensus from all specialties involved is that patient is at an overall HIGH risk for this procedure given his current medical state. This has been reviewed at length with patient by his surgeon. Additionally, attending anesthesiologist will further reiterate risks on the day of his procedure to ensure that patient has a firm understanding of the significant risks associated with proceeding with the planned surgical course. Risks include, but are not limited to, exacerbation of current pulmonary condition requiring prolonged mechanical ventilation, CVA, MI, and even death.   There is concern related to postoperative compliance, oxygen requirements, and follow up. Patient has only recently established with a PCP; seen post-discharge x 1 back on 10/09/2021. He does not he see anyone from PCCM as on outpatient. Patient has been encouraged to establish care with pulmonary medicine for ongoing care and management of his complex medical conditions. Again, this patient has been  deemed to be HIGH risk for this procedure. He is aware of this and still wishes to proceed. Patient is on disability and is living with his mother. Mother will assist in his postoperative recovery.   With that being said, based on clinical review performed today (01/30/22), barring any significant acute changes in the patient's overall condition, it is anticipated that he will be able to proceed with the planned surgical intervention. Patient has been asked to communicate and acute changes in clinical condition to surgeon's office, as significant changes may necessitate his procedure being postponed and/or cancelled. Patient will meet with anesthesia team (MD and/or CRNA) on the day of his  procedure for preoperative evaluation/assessment. Questions regarding anesthetic course will be fielded at that time. Pre-surgical instructions were reviewed with the patient during his PAT appointment and questions were fielded by PAT clinical staff. Patient was advised that if any questions or concerns arise prior to his procedure then he should return a call to PAT and/or his surgeon's office to discuss.  Honor Loh, MSN, APRN, FNP-C, CEN [facility]  Peri-operative Services Nurse Practitioner Phone: [phone] Fax: [phone] 01/30/22 9:54 AM  NOTE: This note has been prepared using Dragon dictation software. Despite my best ability to proofread, there is always the potential that unintentional transcriptional errors may still occur from this process.

## 2022-01-30 ENCOUNTER — Encounter: Payer: Self-pay | Admitting: Surgery

## 2022-01-30 MED ORDER — CHLORHEXIDINE GLUCONATE CLOTH 2 % EX PADS: 6.0000 | MEDICATED_PAD | Freq: Once | CUTANEOUS | Status: DC

## 2022-01-30 MED ORDER — ORAL CARE MOUTH RINSE: 15.0000 mL | Freq: Once | OROMUCOSAL | Status: AC

## 2022-01-30 MED ORDER — GABAPENTIN 100 MG PO CAPS: 200.0000 mg | ORAL_CAPSULE | ORAL | Status: AC

## 2022-01-30 MED ORDER — ACETAMINOPHEN 500 MG PO TABS: 1000.0000 mg | ORAL_TABLET | ORAL | Status: AC

## 2022-01-30 MED ORDER — SODIUM CHLORIDE 0.9 % IV SOLN: INTRAVENOUS | Status: DC

## 2022-01-30 MED ORDER — CEFAZOLIN SODIUM-DEXTROSE 2-4 GM/100ML-% IV SOLN
2.0000 g | INTRAVENOUS | Status: AC
  Administered 2022-01-31: 2 g via INTRAVENOUS

## 2022-01-30 MED ORDER — FAMOTIDINE 20 MG PO TABS: 20.0000 mg | ORAL_TABLET | Freq: Once | ORAL | Status: AC

## 2022-01-30 MED ORDER — CHLORHEXIDINE GLUCONATE 0.12 % MT SOLN: 15.0000 mL | Freq: Once | OROMUCOSAL | Status: AC

## 2022-01-30 NOTE — Progress Notes (Incomplete)
Perioperative Services  Pre-Admission/Anesthesia Testing Clinical Review  Date: 01/29/22  Patient Demographics:  Name: Travis Long DOB:   04-29-1985 MRN:   [id number]  Planned Surgical Procedure(s):    Case: [id number] Date/Time: 01/31/22 0715   Procedure: LAPAROSCOPIC REMOVAL CONTINUOUS AMBULATORY PERITONEAL DIALYSIS  (CAPD) CATHETER   Anesthesia type: General   Pre-op diagnosis: PD catheter malfunction   Location: [facility] OR ROOM 02 / [place] ORS FOR ANESTHESIA GROUP   Surgeons: Olean Ree, MD   NOTE: Available PAT nursing documentation and vital signs have been reviewed. Clinical nursing staff has updated patient's PMH/PSHx, current medication list, and drug allergies/intolerances to ensure comprehensive history available to assist in medical decision making as it pertains to the aforementioned surgical procedure and anticipated anesthetic course. Extensive review of available clinical information performed. [place] PMH and PSHx updated with any diagnoses/procedures that  may have been inadvertently omitted during his intake with the pre-admission testing department's nursing staff.  Clinical Discussion:  Travis Long is a 35 y.o. male who is submitted for pre-surgical anesthesia review and clearance prior to him undergoing the above procedure. Patient is a Former Smoker (quit 09/2019). 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   Patient on hemodialysis on Monday, Wednesday, and Friday. Notes indicate that due to "behavioral issues" while at [facility],  he has been banned from receiving dialysis  services near his home in [place], [place]. Patient has been banned by all [facility] owned/operated dialysis centers. Provider at [facility] petitioned to have patient reaccepted for dialysis within the [facility] system, however petition was denied. Of note, patient ultimately desires to undergo renal transplant. With that being said, he has been denied renal transplant at [facility] due to a documented history of medical non-compliance. Patient is receiving his dialysis at [facility] here in [place], [place].   Patient with a complicated medical history requiring multiple hospitalizations:    ADMISSION (04/27/2020 - 05/05/2020) --> Patient admitted to at [facility] when he was found to have pneumonia. He was treated with IV antibiotics. Patient received dialysis services during his admission. During course of treatment, patient had disagreement with nephrologist and communicated violent threats. He ultimately signed out AMA on 05/05/2020.   ADMISSION (05/08/2020 - 05/18/2020) --> Patient presented to [facility] for increased shortness of breath. Patient unable to receive dialysis services in the [facility] system, thus he presented in volume overload. Patient found to be anemic during admission; Hgb levels as low as 6.1 gm/dL. Transfusion was recommended, however patient declined. Patient did agree to treatment with IV iron infusions and EPO injections. CT imaging revealed moderate to large BILATERAL pleural effusions that were stable to slightly increased on the LEFT. Mild dependent adjacent compressive atelectasis noted. Patient consented to BILATERAL thoracenteses; 2130 cc yield from the LEFT and 1030 cc from the RIGHT. Dialysis services resumed during admission. Patient was stabilized. Care management became involved due to issues with patient's inability to receive outpatient dialysis services. They were able to get patient accepted to the  nephrology service in [place]. Patient discharged home in stable condition on 05/18/2020 with plans for outpatient follow up with Dr. Anthonette Legato, MD in [place], [place] (  11/30/2020 - 12/02/2020) --> Patient presented to the ED at [facility] hypoxic and signs of fluid overload. Oxygen saturations in the low 80s despite hemodialysis treatment earlier in the day removing approximately 4 L of fluid.  CXR revealed a significant LEFT pleural effusion as well as pulmonary edema.  Patient reportedly noncompliant with prescribed antihypertensives; blood pressure documented at 220/145 mmHg.  Patient underwent BILATERAL thoracenteses with 1.2 L being removed from each lung. He was dialyzed during his admission. Blood pressured normalized. Patient discharged home in stable condition on 12/02/2020 with plans for outpatient follow up with nephrology.   ADMISSION (03/06/2021 - 03/10/2021) --> Patient presented to the ED with complaints of worsening exertional dyspnea in the setting of known BILATERAL pleural effusions.  Patient reported that he had not been taking his antihypertensive medications because they were in an upstairs bedroom, and due to his shortness of breath, he was unable to reach them.  Patient missed scheduled dialysis treatment that day.  Presenting blood pressure 207/129 mmHg. Hospital course complicated by acute metabolic encephalopathy.  Patient wanted to sign out AMA and ultimately had to be placed under involuntary commitment compelling him to stay due to acute health issues. CXR revealed increased opacification of the LEFT hemithorax secondary to recurrent pleural effusion and associated atelectasis.  Patient underwent thoracentesis with a 750 cc fluid yield.  CT of the chest, following thoracentesis, revealed consolidation consistent with pneumonia.  There was loculated parapneumonic pleural fluid consistent with a developing empyema.  TTE performed  on 03/09/2021 showed a normal left ventricular systolic function with severe LVH; LVEF 60-65%.  Diastolic Doppler parameters consistent with pseudonormalization (G2DD); GLS -8.5%.  Patient underwent CT-guided chest tube placement (14 Fr) for empyema evacuation and tPA administration.  850 cc fluid removed during chest tube insertion.  Chest tube became dislodged and required repositioning and subsequent up sizing to 16 Fr. repeat imaging showed progression of hydropneumothorax.  The decision was made to transfer patient to [facility] for CVTS consultation.  ADMISSION (03/10/2021 - 03/15/2021) --> patient admitted to [facility] for ongoing care.  Patient was seen in consult by Dr. Gilford Raid, MD (CVTS).  Etiology of effusion felt to be parapneumonic or CHF.  MD noted that lung appeared to be encased and a "thick peel" and most likely trapped by scar tissue.  Despite multiple thoracenteses, patient's lung failed to reexpand accounting for the a lateral pneumothorax seen on imaging.  CVTS did not feel that it was feasible to perform a LEFT thoracotomy to drain the residual fluid and decorticate the lung.  Per Dr. Cyndia Bent, "procedure would likely result in significant injury to the lung with prolonged air leak, and I do not think that the LEFT lung is going to reexpand.  Patient would likely end up with a chronic chest tube draining, persistent air leaks, and would be worse off than he is now".  Surgeon added that surgical intervention and these cases need to occur within the first few weeks to have any chance of reexpanding the lung.  Patient received ongoing management of acute issues and was ultimately discharged home in stable condition on 03/15/2021 with plans to follow-up with outpatient nephrology and pulmonology.  ADMISSION (04/17/2021 - 04/27/2021) --> patient presented [facility] from the hemodialysis center for worsening shortness of breath.  Patient  denied any chest pain.  He was significantly hypertensive at 194/130 mmHg.  CXR showed a large LEFT pleural effusion despite previous  interventions.  Patient presented dyspneic, but not hypoxic felt to be secondary to incomplete hemodialysis treatment. Partial treatment due to malfunction of vascular assess. RIGHT IJ tunneled dialysis catheter placed and patient continued hemodialysis treatments during admission.  Of note, patient with postprocedural agitation in the setting of hypoxia, metabolic encephalopathy, and procedural sedation.  Patient seen in consult by the PCCM during admission.  MD noted increased hydropneumothorax since previous CT imaging performed in 02/2021.  MD reviewed consultation notes from CVTS at [facility]; opting for conservative management.  PCCM provider felt that patient needed a second opinion from [place] at [facility]  Per Dr. Mortimer Fries, "there is nothing that PCCM can offer this patient at this time". Patient unable to be weaned from supplemental oxygen during admission; discharged home on 2L/[place].   ADMISSION (09/17/2021 - 10/01/2021) --> patient presented for care at [facility] with acute on chronic hypoxic respiratory failure. Patient with acute pulmonary edema. NIPPV (BiPAP) required. Patient underwent a LEFT thoracentesis with a 330 cc yield with little no no improvement. He required urgent hemodialysis. During his admission, patient found to be significantly anemic despite outpatient EPO injection. Hemoglobin as low at 6.3 g/d, requiring transfusion of 2 units of PRBCs. CVTS was consulted. Recommendations from [facility] and [facility] providers reviewed that indicated that patient was not an appropriate surgical candidate due to comorbidities, concern for postoperative medical noncompliance, and oxygen requirements when active".  Given clinical condition, the decision was made to move forward with LEFT thoracotomy with lung decortication on 09/21/2021. LEFT lung found to be "trapped" during the  procedure; tolerated procedure well overall. Extubated in ICU to 6L/[place] the following day and eventually moved out to floor with chest ube in place secondary to apical pneumothorax. Imaging of the chest revealed bilateral pulmonary infitrates concerns for pneumonia, however notes indicated that patient frequently refused PO/IV antibiotics during his admission. Chest tube to water seal on 09/25/2021 and eventually discontinued on 09/27/2021. Patient improved clinically and was ultimately dichared home on 10/01/2021 in stable condition. Of note, patient dsicharged with course of oral levofloxacin.   Since time of discharge, blood pressure being maintained on oral CCB (amlodipine), beta-blocker (carvedilol), and vasodilator (hydralazine + minoxadil) therapies.  Patient with increased oxygen demand following decortication procedure.  Patient currently on between 4.5 and 6 L/[place] as needed, which he notes that he generally uses with exertion.  He does not require supplemental oxygen at rest per his report.  Most recent imaging of the chest performed on 10/25/2021 showed sizable BILATERAL loculated hydropneumothoraces in the lung bases (R >L), in addition to a small loculated hydropneumothorax at the LEFT apex.  Pleural collections noted to "contain more gas" since previous imaging.  There were continued consolidative changes in the RIGHT lower lobe, and small scattered areas of consolidation/atelectasis along the periphery of the left lung.  Kenan Moodie has expressed desire to discontinue hemodialysis in order to pursue peritoneal dialysis at home.  Initially dialysis center referred patient to vascular for graft revision or jump graft. Patient was seen in consult, however he later decided that he wanted to pursue peritoneal dialysis. He has been seen in consult by general surgery Hampton Abbot, MD) to discuss surgical options. In efforts to ensure the safest and most effective surgical course, multidisciplinary review of  the case has been performed by patient's specialty providers. Dr. Hampton Abbot has agreed to perform a LAPAROSCOPIC INSERTION CONTINUOUS AMBULATORY PERITONEAL DIALYSIS (CAPD) CATHETER on 07/27/2021.  In review of his medication reconciliation, it is noted the patient  is not currently taking any type of anticoagulation or antiplatelet therapies that need to be held during his perioperative course.  Patient denies previous perioperative complications with anesthesia in the past. In review of the available records, it is noted that patient underwent a regional anesthetic course at [facility] in 09/2019 without documented complications.      01/24/2022    9:11 AM 07/27/2021    3:27 PM 07/27/2021    3:15 PM  Vitals with BMI  Height 5\' 8"     Weight 166 lbs    BMI 16.10    Systolic 960 454 098  Diastolic 96 119 147  Pulse 88 85 87    Providers/Specialists:   NOTE: Primary physician provider listed below. Patient may have been seen by APP or partner within same practice.   PROVIDER ROLE / SPECIALTY LAST OV / Blair Hailey, MD General Surgeon (Surgeon) 07/21/2021  No established PCP Primary Care Provider N/A  [name], MD Vascular Surgery 06/07/2021  Ottie Glazier, MD Pulmonary Medicine (inpatient consult only) 04/27/2021  Flora Lipps, MD Pulmonary Medicine (inpatient consult only) 04/17/2021  Wallene Huh, MD Pulmonary Medicine (inpatient consult only) 03/16/2021  Anthonette Legato, MD Nephrology 04/27/2021  Gilford Raid, MD Cardiothoracic Surgery [facility]) 03/11/2021  Owens Shark, MD Cardiothoracic Surgery ([facility]) 06/27/2021   Allergies:  Patient has no known allergies.  Current Home Medications:   No current facility-administered medications for this encounter.   Marland Kitchen acetaminophen (TYLENOL) 500 MG tablet  . amLODipine (NORVASC) 10 MG tablet  . calcium acetate (PHOSLO) 667 MG capsule  . carvedilol (COREG) 25 MG tablet  .  dicyclomine (BENTYL) 10 MG capsule  . hydrALAZINE (APRESOLINE) 100 MG tablet  . minoxidil (LONITEN) 2.5 MG tablet  . OXYGEN   History:   Past Medical History:  Diagnosis Date  . Acute pulmonary edema (HCC)   . Acute respiratory failure with hypoxia ([place])   . Anemia of chronic renal failure    a.) receiving epoetin alfa (EPOGEN) injections  . CAP (community acquired pneumonia)   . Dyspnea   . Dyspnea on exertion   . Encephalopathy   . ESRD on hemodialysis [facility])    a.) banned by all [facility] owned/operated dialysis centers due to "behavioral issues"; [facility] provider petitioned for him to be re-accepted, but was denied. b.) currently on HD with a M-W-F schedule; plans are to transition to PD once catheter placed in 07/2021. c.) has been seen by [facility] renal transplant service; rejected due to medical non-compliance.  Marland Kitchen GERD (gastroesophageal reflux disease)   . Heart failure with preserved ejection fraction ([place])    a.) TTE 03/09/2021: EF 60-65%; sev LVH; GLS -8.5%; mild LA dilation; mild TR/PR; G2DD.  Marland Kitchen Hypertension   . Major depression   . Marijuana use   . Medically noncompliant    a.) does not take antihypertensives as Rx. b.) misses scheduled HD Tx; c.) refuses medically necessary interventions as recommended by his providers (i.e. blood transfusions, medications). d.) involuntarily committed in 02/2021 due attempts to sign out AMA during acute illness; psychiatry consulted. e.) rejected by [facility] for consideration of renal transplant due to past non-compliance  . Metabolic encephalopathy   . On supplemental oxygen by nasal cannula    a.) 4.5-6 L/[place] on a PRN basis; mostly used with exertion  . Procedural agitation 03/2021   a.) post-procedural agitation following IJ catheter placement in 03/2021; occurred in setting of significant hypoxia, metabolic encephalopathy, and  procedural sedation; states, "I was tied down and it caused me to spazz out".  . Recurrent pleural effusion    a.)  s/p mult thoracenteses with (+) sig fluid yields: 2130 cc LEFT (05/12/2020), 1030 cc RIGHT (05/14/2020), 1200 cc LEFT 11/30/2020, 1200 cc RIGHT (12/01/2020), 750 cc LEFT (03/06/2021), 850 cc (03/08/2021), 330 cc LEFT (09/17/2021); b.) 03/08/2021 ->  LEFT 14 Fr pigtail cath to PleurEvac; c.) empyema dev req tube upsizing on 03/09/2021;  d.) s/p LEFT thoracotomy and decortication 09/21/2021  . Tobacco use    Past Surgical History:  Procedure Laterality Date  . A/V FISTULAGRAM Left 03/09/2021   Procedure: A/V Fistulagram;  Surgeon: Algernon Huxley, MD;  Location: [place] CV LAB;  Service: Cardiovascular;  Laterality: Left;  . A/V FISTULAGRAM Left 07/06/2021   Procedure: A/V Fistulagram;  Surgeon: Algernon Huxley, MD;  Location: [place] CV LAB;  Service: Cardiovascular;  Laterality: Left;  . AV FISTULA PLACEMENT Left   . CAPD INSERTION N/A 07/27/2021   Procedure: LAPAROSCOPIC INSERTION CONTINUOUS AMBULATORY PERITONEAL DIALYSIS  (CAPD) CATHETER;  Surgeon: Olean Ree, MD;  Location: [facility] ORS;  Service: General;  Laterality: N/A;  . DIALYSIS/PERMA CATHETER INSERTION N/A 04/18/2021   Procedure: DIALYSIS/PERMA CATHETER INSERTION;  Surgeon: Katha Cabal, MD;  Location: [place] CV LAB;  Service: Cardiovascular;  Laterality: N/A;  . IR CATHETER TUBE CHANGE  03/09/2021  . THORACOTOMY Left 09/21/2021   decortication, cryo ablation-done at [facility]   Family History  Problem Relation Age of Onset  . Hypertension Mother   . Diabetes Mellitus II Mother    Social History   Tobacco Use  . Smoking status: Former    Types: Cigarettes    Quit date: 09/2019    Years since quitting: 2.3    Passive exposure: Past  . Smokeless tobacco: Never  Vaping Use  . Vaping Use: Never used  Substance Use Topics  . Alcohol use: Not Currently  . Drug use: Never    Pertinent Clinical Results:  LABS: Labs reviewed: Acceptable for surgery.  Lab Results  Component Value Date   WBC 10.7 (H)  04/26/2021   HGB 11.2 (L) 07/27/2021   HCT 33.0 (L) 07/27/2021   MCV 94.2 04/26/2021   PLT 315 04/26/2021   Lab Results  Component Value Date   NA 134 (L) 07/27/2021   K 4.5 07/27/2021   CO2 32 04/26/2021   GLUCOSE 92 07/27/2021   BUN 42 (H) 07/27/2021   CREATININE 11.80 (H) 07/27/2021   CALCIUM 8.7 (L) 04/26/2021   GFRNONAA 4 (L) 04/26/2021    ECG: Date: 04/17/2021 Time ECG obtained: 1032 AM Rate: 91 bpm Rhythm: normal sinus Axis (leads I and aVF): Right axis deviation Intervals: PR 146 ms. QRS 82 ms. QTc 457 ms. ST segment and T wave changes: No evidence of acute ST segment elevation or depression. LVH with repolarization abnormality.  Comparison: Similar to previous tracing obtained on 03/11/2021   IMAGING / PROCEDURES: CT CHEST WITHOUT CONTRAST WITH 3D MIPS PROTOCOL performed on 06/27/2021 Central airways are patent.  There is a moderate sized loculated LEFT pleural effusion with overlying pleural thickening. The effusion is unchanged in size compared to prior CT 04/17/2021, although the previously seen locules of gas within the collection have resolved. There is associated unchanged rounded atelectasis in the LEFT lower lobe.  There is additional unchanged moderate size RIGHT pleural effusion also with adjacent pleural thickening and with associated RIGHT lower and middle lobe rounded atelectasis which is unchanged from prior  exam.  No new findings.    PULMONARY FUNCTION TESTING performed on 06/27/2021   Ref Range & Units 06/27/2021  FVC Pre L 1.16   FEV1 Pre L 1.03   FEV1/FVC Pre % 89.44   FEF25-75% Pre L/s 1.41   PEF Pre L/s 4.33   FVC_LLN  3.34   FVC_Z-SCORE  -5.71   FVC_%PRED % 27 %   FVC_Z-SCOREGRAPH  -5.71   FEV1_LLN  2.71   FEV1_Z-SCORE  -4.74   FEV1_%PRED % 29 %   FEV1_Z-SCOREGRAPH  -4.74   FEV1/FVC_LLN  73   FEF25-75%_LLN  2.00   FEF25-75%_Z-SCORE  -2.40   FEF25-75%_%PRED % 39 %   FEF25-75%_Z-SCOREGRAPH  -2.40   PEF_LLN  6.55   PEF_Z-SCORE  -3.10    PEF_%PRED % 48 %   PEF_Z-SCOREGRAPH  -3.10   Resulting Agency  [facility]  Specimen Collected: 06/27/21 10:39 Last Resulted: 06/28/21 14:06  Received From: [place]  Result Received: 07/05/21 15:40   DIAGNOSTIC RADIOGRAPHS OF CHEST 2 VIEWS performed on 04/27/2021 Heart size difficult to assess due to adjacent opacities. Large LEFT and moderate RIGHT pleural effusions as before.  Extensive consolidation/atelectasis throughout much of the LEFT lung, and the RIGHT lung base.  Stable tunneled RIGHT IJ hemodialysis catheter to the cavoatrial junction.  CT CHEST WITHOUT CONTRAST performed on 04/17/2021 Compared to 03/06/2021, there is interval increase in the fluid and air components of a moderate LEFT hydropneumothorax.  Interval mild increase in moderate BILATERAL pleural effusions. There again appear to be multiple loculations within the hydropneumothorax, also increased from prior. There is again high-grade consolidation of the posterior LEFT lower lobe either pneumonia versus round atelectasis. Note is made of prior 03/09/2021 basilar pleural pigtail catheter placement in the region of this hydropneumothorax on prior 03/09/2021 radiographs. Following removal of this pigtail catheter, the extrapleural air and fluid appears to have increased over time.   Impression and Plan:  Petros Ahart has been referred for pre-anesthesia review and clearance prior to him undergoing the planned anesthetic and procedural courses. Available labs, pertinent testing, and imaging results were personally reviewed by me. Interdisciplinary input has solicited and reviewed. Consensus from all specialties involved is that patient is at an overall HIGH risk for this procedure given his current medical state. This has been reviewed at length with patient by his surgeon. Additionally, attending anesthesiologist plans to further reiterate risks on the day of his procedure to ensure that  patient has a firm understanding of the significant risks associated with proceeding with the planned surgical course. Risks include, but are not limited to, exacerbation of current pulmonary condition requiring prolonged mechanical ventilation, CVA, MI, and even death.   There is concern related to postoperative compliance, oxygen requirements, and follow up. Patient is not established with a PCP, nor does he see anyone from PCCM as on outpatient. Patient has been encouraged to establish care with a primary care provider and with pulmonary medicine for ongoing care and management of his complex medical conditions. Again, this patient has been deemed to be HIGH risk for this procedure. He is aware of this and still wishes to proceed. Patient is on disability and is living with his mother. Mother will assist in his postoperative recovery.   With that being said, based on clinical review performed today (01/29/22), barring any significant acute changes in the patient's overall condition, it is anticipated that he will be able to proceed with the planned surgical intervention. Patient has been asked to  communicate and acute changes in clinical condition to surgeon's office, as significant changes may necessitate his procedure being postponed and/or cancelled. Patient will meet with anesthesia team (MD and/or CRNA) on the day of his procedure for preoperative evaluation/assessment. Questions regarding anesthetic course will be fielded at that time. Pre-surgical instructions were reviewed with the patient during his PAT appointment and questions were fielded by PAT clinical staff. Patient was advised that if any questions or concerns arise prior to his procedure then he should return a call to PAT and/or his surgeon's office to discuss.  Honor Loh, MSN, APRN, FNP-C, CEN [facility]  Peri-operative Services Nurse Practitioner Phone: [phone] Fax: [phone] 01/29/22 5:20 PM  NOTE:  This note has been prepared using Dragon dictation software. Despite my best ability to proofread, there is always the potential that unintentional transcriptional errors may still occur from this process.

## 2022-01-31 ENCOUNTER — Ambulatory Visit: Payer: Medicare Other | Admitting: Urgent Care

## 2022-01-31 ENCOUNTER — Encounter
Admission: RE | Disposition: A | Discharge status: Home / Self Care | Payer: Self-pay | Source: Home / Self Care | Attending: Pulmonary Disease

## 2022-01-31 ENCOUNTER — Inpatient Hospital Stay: Payer: Medicare Other

## 2022-01-31 ENCOUNTER — Other Ambulatory Visit: Payer: Self-pay

## 2022-01-31 ENCOUNTER — Inpatient Hospital Stay
Admission: RE | Admit: 2022-01-31 | Discharge: 2022-02-02 | DRG: 907 | Disposition: A | Discharge status: Home / Self Care | Payer: Medicare Other | Attending: Pulmonary Disease | Admitting: Pulmonary Disease

## 2022-01-31 ENCOUNTER — Encounter: Payer: Self-pay | Admitting: Surgery

## 2022-01-31 DIAGNOSIS — N62 Hypertrophy of breast: Secondary | ICD-10-CM | POA: Diagnosis present

## 2022-01-31 DIAGNOSIS — Z4902 Encounter for fitting and adjustment of peritoneal dialysis catheter: Secondary | ICD-10-CM

## 2022-01-31 DIAGNOSIS — Z833 Family history of diabetes mellitus: Secondary | ICD-10-CM | POA: Diagnosis not present

## 2022-01-31 DIAGNOSIS — Z91199 Patient's noncompliance with other medical treatment and regimen due to unspecified reason: Secondary | ICD-10-CM | POA: Diagnosis not present

## 2022-01-31 DIAGNOSIS — D631 Anemia in chronic kidney disease: Secondary | ICD-10-CM | POA: Diagnosis present

## 2022-01-31 DIAGNOSIS — Z91158 Patient's noncompliance with renal dialysis for other reason: Secondary | ICD-10-CM | POA: Diagnosis not present

## 2022-01-31 DIAGNOSIS — Z992 Dependence on renal dialysis: Secondary | ICD-10-CM | POA: Diagnosis not present

## 2022-01-31 DIAGNOSIS — K219 Gastro-esophageal reflux disease without esophagitis: Secondary | ICD-10-CM | POA: Diagnosis present

## 2022-01-31 DIAGNOSIS — J811 Chronic pulmonary edema: Secondary | ICD-10-CM | POA: Diagnosis present

## 2022-01-31 DIAGNOSIS — J95821 Acute postprocedural respiratory failure: Secondary | ICD-10-CM | POA: Diagnosis not present

## 2022-01-31 DIAGNOSIS — Z9981 Dependence on supplemental oxygen: Secondary | ICD-10-CM

## 2022-01-31 DIAGNOSIS — Z9889 Other specified postprocedural states: Principal | ICD-10-CM

## 2022-01-31 DIAGNOSIS — F329 Major depressive disorder, single episode, unspecified: Secondary | ICD-10-CM | POA: Diagnosis present

## 2022-01-31 DIAGNOSIS — Z8249 Family history of ischemic heart disease and other diseases of the circulatory system: Secondary | ICD-10-CM | POA: Diagnosis not present

## 2022-01-31 DIAGNOSIS — T85611A Breakdown (mechanical) of intraperitoneal dialysis catheter, initial encounter: Secondary | ICD-10-CM | POA: Diagnosis present

## 2022-01-31 DIAGNOSIS — I132 Hypertensive heart and chronic kidney disease with heart failure and with stage 5 chronic kidney disease, or end stage renal disease: Secondary | ICD-10-CM | POA: Diagnosis present

## 2022-01-31 DIAGNOSIS — Y838 Other surgical procedures as the cause of abnormal reaction of the patient, or of later complication, without mention of misadventure at the time of the procedure: Secondary | ICD-10-CM | POA: Diagnosis present

## 2022-01-31 DIAGNOSIS — T85898A Other specified complication of other internal prosthetic devices, implants and grafts, initial encounter: Secondary | ICD-10-CM | POA: Diagnosis present

## 2022-01-31 DIAGNOSIS — J9611 Chronic respiratory failure with hypoxia: Secondary | ICD-10-CM | POA: Diagnosis present

## 2022-01-31 DIAGNOSIS — E8770 Fluid overload, unspecified: Secondary | ICD-10-CM | POA: Diagnosis not present

## 2022-01-31 DIAGNOSIS — Z01812 Encounter for preprocedural laboratory examination: Secondary | ICD-10-CM

## 2022-01-31 DIAGNOSIS — J810 Acute pulmonary edema: Secondary | ICD-10-CM | POA: Diagnosis not present

## 2022-01-31 DIAGNOSIS — N2581 Secondary hyperparathyroidism of renal origin: Secondary | ICD-10-CM | POA: Diagnosis present

## 2022-01-31 DIAGNOSIS — Z79899 Other long term (current) drug therapy: Secondary | ICD-10-CM

## 2022-01-31 DIAGNOSIS — I5032 Chronic diastolic (congestive) heart failure: Secondary | ICD-10-CM | POA: Diagnosis present

## 2022-01-31 DIAGNOSIS — K449 Diaphragmatic hernia without obstruction or gangrene: Secondary | ICD-10-CM | POA: Diagnosis present

## 2022-01-31 DIAGNOSIS — J90 Pleural effusion, not elsewhere classified: Secondary | ICD-10-CM | POA: Diagnosis present

## 2022-01-31 DIAGNOSIS — T503X5A Adverse effect of electrolytic, caloric and water-balance agents, initial encounter: Secondary | ICD-10-CM | POA: Diagnosis present

## 2022-01-31 DIAGNOSIS — N186 End stage renal disease: Secondary | ICD-10-CM | POA: Diagnosis present

## 2022-01-31 DIAGNOSIS — Z87891 Personal history of nicotine dependence: Secondary | ICD-10-CM

## 2022-01-31 HISTORY — DX: Diaphragmatic hernia without obstruction or gangrene: K44.9

## 2022-01-31 HISTORY — DX: Diverticulosis of intestine, part unspecified, without perforation or abscess without bleeding: K57.90

## 2022-01-31 HISTORY — PX: CAPD REMOVAL: SHX5234

## 2022-01-31 HISTORY — DX: Hypertrophy of breast: N62

## 2022-01-31 HISTORY — DX: Splenomegaly, not elsewhere classified: R16.1

## 2022-01-31 LAB — BLOOD GAS, ARTERIAL
Acid-Base Excess: 10.1 mmol/L — ABNORMAL HIGH (ref 0.0–2.0)
Bicarbonate: 34.3 mmol/L — ABNORMAL HIGH (ref 20.0–28.0)
FIO2: 35 %
MECHVT: 410 mL
Mechanical Rate: 28
O2 Saturation: 100 %
PEEP: 5 cmH2O
Patient temperature: 37
pCO2 arterial: 43 mmHg (ref 32–48)
pH, Arterial: 7.51 — ABNORMAL HIGH (ref 7.35–7.45)
pO2, Arterial: 129 mmHg — ABNORMAL HIGH (ref 83–108)

## 2022-01-31 LAB — POCT I-STAT, CHEM 8
BUN: 40 mg/dL — ABNORMAL HIGH (ref 6–20)
Calcium, Ion: 1.03 mmol/L — ABNORMAL LOW (ref 1.15–1.40)
Chloride: 91 mmol/L — ABNORMAL LOW (ref 98–111)
Creatinine, Ser: 13.2 mg/dL — ABNORMAL HIGH (ref 0.61–1.24)
Glucose, Bld: 117 mg/dL — ABNORMAL HIGH (ref 70–99)
HCT: 30 % — ABNORMAL LOW (ref 39.0–52.0)
Hemoglobin: 10.2 g/dL — ABNORMAL LOW (ref 13.0–17.0)
Potassium: 3.5 mmol/L (ref 3.5–5.1)
Sodium: 137 mmol/L (ref 135–145)
TCO2: 33 mmol/L — ABNORMAL HIGH (ref 22–32)

## 2022-01-31 LAB — CBC
HCT: 22.2 % — ABNORMAL LOW (ref 39.0–52.0)
Hemoglobin: 7.1 g/dL — ABNORMAL LOW (ref 13.0–17.0)
MCH: 30.5 pg (ref 26.0–34.0)
MCHC: 32 g/dL (ref 30.0–36.0)
MCV: 95.3 fL (ref 80.0–100.0)
Platelets: 207 10*3/uL (ref 150–400)
RBC: 2.33 MIL/uL — ABNORMAL LOW (ref 4.22–5.81)
RDW: 13.3 % (ref 11.5–15.5)
WBC: 10.2 10*3/uL (ref 4.0–10.5)
nRBC: 0 % (ref 0.0–0.2)

## 2022-01-31 LAB — GLUCOSE, CAPILLARY
Glucose-Capillary: 101 mg/dL — ABNORMAL HIGH (ref 70–99)
Glucose-Capillary: 71 mg/dL (ref 70–99)
Glucose-Capillary: 79 mg/dL (ref 70–99)
Glucose-Capillary: 78 mg/dL (ref 70–99)
Glucose-Capillary: 78 mg/dL (ref 70–99)

## 2022-01-31 LAB — BRAIN NATRIURETIC PEPTIDE: B Natriuretic Peptide: 302.2 pg/mL — ABNORMAL HIGH (ref 0.0–100.0)

## 2022-01-31 LAB — HEPATITIS B SURFACE ANTIGEN: Hepatitis B Surface Ag: NONREACTIVE

## 2022-01-31 LAB — MRSA NEXT GEN BY PCR, NASAL: MRSA by PCR Next Gen: NOT DETECTED

## 2022-01-31 LAB — HEPATITIS B CORE ANTIBODY, TOTAL: Hep B Core Total Ab: NONREACTIVE

## 2022-01-31 SURGERY — LAPAROSCOPIC REMOVAL CONTINUOUS AMBULATORY PERITONEAL DIALYSIS  (CAPD) CATHETER
Anesthesia: General

## 2022-01-31 MED ORDER — PROPOFOL 1000 MG/100ML IV EMUL
INTRAVENOUS | Status: AC
  Filled 2022-01-31: qty 100

## 2022-01-31 MED ORDER — ALBUTEROL SULFATE HFA 108 (90 BASE) MCG/ACT IN AERS
INHALATION_SPRAY | RESPIRATORY_TRACT | Status: DC | PRN
  Administered 2022-01-31 (×2): 6 via RESPIRATORY_TRACT

## 2022-01-31 MED ORDER — FENTANYL CITRATE (PF) 100 MCG/2ML IJ SOLN
INTRAMUSCULAR | Status: AC
  Filled 2022-01-31: qty 2

## 2022-01-31 MED ORDER — CHLORHEXIDINE GLUCONATE CLOTH 2 % EX PADS
6.0000 | MEDICATED_PAD | Freq: Every day | CUTANEOUS | Status: DC
  Administered 2022-01-31: 6 via TOPICAL

## 2022-01-31 MED ORDER — PROPOFOL 10 MG/ML IV BOLUS
INTRAVENOUS | Status: AC
  Filled 2022-01-31: qty 40

## 2022-01-31 MED ORDER — LIDOCAINE HCL (PF) 1 % IJ SOLN: 5.0000 mL | INTRAMUSCULAR | Status: DC | PRN

## 2022-01-31 MED ORDER — LIDOCAINE HCL (CARDIAC) PF 100 MG/5ML IV SOSY
PREFILLED_SYRINGE | INTRAVENOUS | Status: DC | PRN
  Administered 2022-01-31: 100 mg via INTRAVENOUS

## 2022-01-31 MED ORDER — CHLORHEXIDINE GLUCONATE 0.12 % MT SOLN
OROMUCOSAL | Status: AC
  Administered 2022-01-31: 15 mL via OROMUCOSAL
  Filled 2022-01-31: qty 15

## 2022-01-31 MED ORDER — ROCURONIUM BROMIDE 100 MG/10ML IV SOLN
INTRAVENOUS | Status: DC | PRN
  Administered 2022-01-31 (×2): 50 mg via INTRAVENOUS

## 2022-01-31 MED ORDER — BUPIVACAINE-EPINEPHRINE (PF) 0.5% -1:200000 IJ SOLN
INTRAMUSCULAR | Status: AC
  Filled 2022-01-31: qty 30

## 2022-01-31 MED ORDER — FENTANYL CITRATE (PF) 100 MCG/2ML IJ SOLN
INTRAMUSCULAR | Status: DC | PRN
  Administered 2022-01-31 (×2): 50 ug via INTRAVENOUS

## 2022-01-31 MED ORDER — FAMOTIDINE 20 MG PO TABS
10.0000 mg | ORAL_TABLET | Freq: Every day | ORAL | Status: DC
  Administered 2022-01-31: 10 mg
  Filled 2022-01-31: qty 1

## 2022-01-31 MED ORDER — POLYETHYLENE GLYCOL 3350 17 G PO PACK: 17.0000 g | PACK | Freq: Every day | ORAL | Status: DC

## 2022-01-31 MED ORDER — FENTANYL CITRATE (PF) 100 MCG/2ML IJ SOLN: 25.0000 ug | INTRAMUSCULAR | Status: DC | PRN

## 2022-01-31 MED ORDER — ROCURONIUM BROMIDE 10 MG/ML (PF) SYRINGE
PREFILLED_SYRINGE | INTRAVENOUS | Status: AC
  Filled 2022-01-31: qty 10

## 2022-01-31 MED ORDER — EPOETIN ALFA 10000 UNIT/ML IJ SOLN
10000.0000 [IU] | INTRAMUSCULAR | Status: DC
  Administered 2022-01-31: 10000 [IU] via INTRAVENOUS
  Filled 2022-01-31: qty 1

## 2022-01-31 MED ORDER — PANTOPRAZOLE SODIUM 40 MG IV SOLR: 40.0000 mg | Freq: Every day | INTRAVENOUS | Status: DC

## 2022-01-31 MED ORDER — FENTANYL 2500MCG IN NS 250ML (10MCG/ML) PREMIX INFUSION
INTRAVENOUS | Status: AC
  Administered 2022-01-31: 50 ug/h via INTRAVENOUS
  Filled 2022-01-31: qty 250

## 2022-01-31 MED ORDER — FAMOTIDINE 20 MG PO TABS
ORAL_TABLET | ORAL | Status: AC
  Administered 2022-01-31: 20 mg via ORAL
  Filled 2022-01-31: qty 1

## 2022-01-31 MED ORDER — ACETAMINOPHEN 500 MG PO TABS: 1000.0000 mg | ORAL_TABLET | Freq: Four times a day (QID) | ORAL | Status: DC | PRN

## 2022-01-31 MED ORDER — LIDOCAINE HCL URETHRAL/MUCOSAL 2 % EX GEL
CUTANEOUS | Status: AC
  Filled 2022-01-31: qty 6

## 2022-01-31 MED ORDER — MIDAZOLAM HCL 2 MG/2ML IJ SOLN
1.0000 mg | INTRAMUSCULAR | Status: DC | PRN
  Administered 2022-01-31: 2 mg via INTRAVENOUS
  Filled 2022-01-31: qty 2

## 2022-01-31 MED ORDER — PROPOFOL 1000 MG/100ML IV EMUL
0.0000 ug/kg/min | INTRAVENOUS | Status: DC
  Administered 2022-01-31: 30 ug/kg/min via INTRAVENOUS
  Administered 2022-02-01: 40 ug/kg/min via INTRAVENOUS
  Filled 2022-01-31 (×3): qty 100

## 2022-01-31 MED ORDER — PROPOFOL 500 MG/50ML IV EMUL
INTRAVENOUS | Status: DC | PRN
  Administered 2022-01-31: 125 ug/kg/min via INTRAVENOUS

## 2022-01-31 MED ORDER — PENTAFLUOROPROP-TETRAFLUOROETH EX AERO: 1.0000 | INHALATION_SPRAY | CUTANEOUS | Status: DC | PRN

## 2022-01-31 MED ORDER — BUPIVACAINE-EPINEPHRINE 0.5% -1:200000 IJ SOLN
INTRAMUSCULAR | Status: DC | PRN
  Administered 2022-01-31: 30 mL

## 2022-01-31 MED ORDER — OXYCODONE HCL 5 MG PO TABS: 5.0000 mg | ORAL_TABLET | Freq: Once | ORAL | Status: DC | PRN

## 2022-01-31 MED ORDER — ONDANSETRON HCL 4 MG/2ML IJ SOLN
INTRAMUSCULAR | Status: DC | PRN
  Administered 2022-01-31: 4 mg via INTRAVENOUS

## 2022-01-31 MED ORDER — PROMETHAZINE HCL 25 MG/ML IJ SOLN: 6.2500 mg | INTRAMUSCULAR | Status: DC | PRN

## 2022-01-31 MED ORDER — DOCUSATE SODIUM 50 MG/5ML PO LIQD
100.0000 mg | Freq: Two times a day (BID) | ORAL | Status: DC
  Administered 2022-01-31: 100 mg
  Filled 2022-01-31: qty 10

## 2022-01-31 MED ORDER — SUGAMMADEX SODIUM 200 MG/2ML IV SOLN
INTRAVENOUS | Status: DC | PRN
  Administered 2022-01-31 (×2): 150.6 mg via INTRAVENOUS

## 2022-01-31 MED ORDER — GABAPENTIN 100 MG PO CAPS
ORAL_CAPSULE | ORAL | Status: AC
  Administered 2022-01-31: 200 mg via ORAL
  Filled 2022-01-31: qty 2

## 2022-01-31 MED ORDER — PROPOFOL 1000 MG/100ML IV EMUL
INTRAVENOUS | Status: AC
  Administered 2022-01-31: 20 ug/kg/min via INTRAVENOUS
  Filled 2022-01-31: qty 100

## 2022-01-31 MED ORDER — ONDANSETRON HCL 4 MG/2ML IJ SOLN
INTRAMUSCULAR | Status: AC
  Filled 2022-01-31: qty 2

## 2022-01-31 MED ORDER — MIDAZOLAM HCL 2 MG/2ML IJ SOLN
INTRAMUSCULAR | Status: DC | PRN
  Administered 2022-01-31: 1 mg via INTRAVENOUS

## 2022-01-31 MED ORDER — ALTEPLASE 2 MG IJ SOLR: 2.0000 mg | Freq: Once | INTRAMUSCULAR | Status: DC | PRN

## 2022-01-31 MED ORDER — DEXAMETHASONE SODIUM PHOSPHATE 10 MG/ML IJ SOLN
INTRAMUSCULAR | Status: DC | PRN
  Administered 2022-01-31: 4 mg via INTRAVENOUS

## 2022-01-31 MED ORDER — 0.9 % SODIUM CHLORIDE (POUR BTL) OPTIME
TOPICAL | Status: DC | PRN
  Administered 2022-01-31: 250 mL

## 2022-01-31 MED ORDER — MIDAZOLAM HCL 2 MG/2ML IJ SOLN
INTRAMUSCULAR | Status: AC
  Filled 2022-01-31: qty 2

## 2022-01-31 MED ORDER — SUCCINYLCHOLINE CHLORIDE 200 MG/10ML IV SOSY
PREFILLED_SYRINGE | INTRAVENOUS | Status: DC | PRN
  Administered 2022-01-31: 20 mg via INTRAVENOUS

## 2022-01-31 MED ORDER — DROPERIDOL 2.5 MG/ML IJ SOLN: 0.6250 mg | Freq: Once | INTRAMUSCULAR | Status: DC | PRN

## 2022-01-31 MED ORDER — CEFAZOLIN SODIUM-DEXTROSE 2-4 GM/100ML-% IV SOLN
INTRAVENOUS | Status: AC
  Filled 2022-01-31: qty 100

## 2022-01-31 MED ORDER — ORAL CARE MOUTH RINSE: 15.0000 mL | OROMUCOSAL | Status: DC | PRN

## 2022-01-31 MED ORDER — PROPOFOL 10 MG/ML IV BOLUS
INTRAVENOUS | Status: DC | PRN
  Administered 2022-01-31: 150 mg via INTRAVENOUS
  Administered 2022-01-31: 50 mg via INTRAVENOUS

## 2022-01-31 MED ORDER — ACETAMINOPHEN 500 MG PO TABS
ORAL_TABLET | ORAL | Status: AC
  Administered 2022-01-31: 1000 mg via ORAL
  Filled 2022-01-31: qty 2

## 2022-01-31 MED ORDER — HEPARIN SODIUM (PORCINE) 1000 UNIT/ML DIALYSIS: 1000.0000 [IU] | INTRAMUSCULAR | Status: DC | PRN

## 2022-01-31 MED ORDER — OXYCODONE HCL 5 MG PO TABS: 5.0000 mg | ORAL_TABLET | Freq: Four times a day (QID) | ORAL | 0 refills | Status: AC | PRN

## 2022-01-31 MED ORDER — OXYCODONE HCL 5 MG/5ML PO SOLN: 5.0000 mg | Freq: Once | ORAL | Status: DC | PRN

## 2022-01-31 MED ORDER — ORAL CARE MOUTH RINSE
15.0000 mL | OROMUCOSAL | Status: DC
  Administered 2022-01-31 – 2022-02-01 (×13): 15 mL via OROMUCOSAL

## 2022-01-31 MED ORDER — ANTICOAGULANT SODIUM CITRATE 4% (200MG/5ML) IV SOLN: 5.0000 mL | Status: DC | PRN

## 2022-01-31 MED ORDER — DEXAMETHASONE SODIUM PHOSPHATE 10 MG/ML IJ SOLN
INTRAMUSCULAR | Status: AC
  Filled 2022-01-31: qty 1

## 2022-01-31 MED ORDER — KETAMINE HCL 50 MG/5ML IJ SOSY
PREFILLED_SYRINGE | INTRAMUSCULAR | Status: AC
  Filled 2022-01-31: qty 5

## 2022-01-31 MED ORDER — FENTANYL BOLUS VIA INFUSION
50.0000 ug | INTRAVENOUS | Status: DC | PRN
  Administered 2022-01-31: 50 ug via INTRAVENOUS

## 2022-01-31 MED ORDER — FAMOTIDINE 20 MG PO TABS: 20.0000 mg | ORAL_TABLET | Freq: Two times a day (BID) | ORAL | Status: DC

## 2022-01-31 MED ORDER — FENTANYL 2500MCG IN NS 250ML (10MCG/ML) PREMIX INFUSION: 50.0000 ug/h | INTRAVENOUS | Status: DC

## 2022-01-31 MED ORDER — ALBUTEROL SULFATE HFA 108 (90 BASE) MCG/ACT IN AERS
INHALATION_SPRAY | RESPIRATORY_TRACT | Status: AC
  Filled 2022-01-31: qty 6.7

## 2022-01-31 MED ORDER — LIDOCAINE-PRILOCAINE 2.5-2.5 % EX CREA: 1.0000 | TOPICAL_CREAM | CUTANEOUS | Status: DC | PRN

## 2022-01-31 MED ORDER — KETAMINE HCL 10 MG/ML IJ SOLN
INTRAMUSCULAR | Status: DC | PRN
  Administered 2022-01-31: 50 mg via INTRAVENOUS

## 2022-01-31 MED ORDER — ACETAMINOPHEN 10 MG/ML IV SOLN: 1000.0000 mg | Freq: Once | INTRAVENOUS | Status: DC | PRN

## 2022-01-31 SURGICAL SUPPLY — 31 items
CHLORAPREP W/TINT 26 (MISCELLANEOUS) ×1 IMPLANT
DERMABOND ADVANCED .7 DNX12 (GAUZE/BANDAGES/DRESSINGS) ×1 IMPLANT
ELECT CAUTERY BLADE TIP 2.5 (TIP) ×1
ELECT REM PT RETURN 9FT ADLT (ELECTROSURGICAL) ×1
ELECTRODE CAUTERY BLDE TIP 2.5 (TIP) ×1 IMPLANT
ELECTRODE REM PT RTRN 9FT ADLT (ELECTROSURGICAL) ×1 IMPLANT
GLOVE SURG SYN 7.0 (GLOVE) ×1 IMPLANT
GLOVE SURG SYN 7.0 PF PI (GLOVE) ×1 IMPLANT
GLOVE SURG SYN 7.5  E (GLOVE) ×1
GLOVE SURG SYN 7.5 E (GLOVE) ×1 IMPLANT
GLOVE SURG SYN 7.5 PF PI (GLOVE) ×1 IMPLANT
GOWN STRL REUS W/ TWL LRG LVL3 (GOWN DISPOSABLE) ×2 IMPLANT
GOWN STRL REUS W/TWL LRG LVL3 (GOWN DISPOSABLE) ×2
KIT TURNOVER KIT A (KITS) ×1 IMPLANT
LABEL OR SOLS (LABEL) ×1 IMPLANT
MANIFOLD NEPTUNE II (INSTRUMENTS) ×1 IMPLANT
NEEDLE HYPO 22GX1.5 SAFETY (NEEDLE) ×1 IMPLANT
NS IRRIG 500ML POUR BTL (IV SOLUTION) ×1 IMPLANT
PACK LAP CHOLECYSTECTOMY (MISCELLANEOUS) ×1 IMPLANT
SET TUBE SMOKE EVAC HIGH FLOW (TUBING) ×1 IMPLANT
SLEEVE ADV FIXATION 5X100MM (TROCAR) IMPLANT
SUT MNCRL 4-0 (SUTURE) ×1
SUT MNCRL 4-0 27XMFL (SUTURE) ×1
SUT VIC AB 3-0 SH 27 (SUTURE) ×1
SUT VIC AB 3-0 SH 27X BRD (SUTURE) IMPLANT
SUT VICRYL 0 AB UR-6 (SUTURE) ×1 IMPLANT
SUTURE MNCRL 4-0 27XMF (SUTURE) ×1 IMPLANT
SYS KII FIOS ACCESS ABD 5X100 (TROCAR) ×1
SYSTEM KII FIOS ACES ABD 5X100 (TROCAR) ×1 IMPLANT
TRAP FLUID SMOKE EVACUATOR (MISCELLANEOUS) ×1 IMPLANT
TROCAR BALLN GELPORT 12X130M (ENDOMECHANICALS) ×1 IMPLANT

## 2022-01-31 NOTE — Progress Notes (Signed)
[facility]  ROUNDING NOTE   Subjective:   Travis Long is a 36 year old male with past medical conditions including hypertension, GERD, anemia, recurrent pleural effusions status post left thoracotomy with decortication, CHF, chronic hypoxic respiratory requiring supplemental oxygen, and end-stage renal disease on hemodialysis.  Patient presents for scheduled procedure, PD catheter removal.  After successful procedure, patient extubated and reintubated due to respiratory failure.  Patient has been admitted for Post-operative state [Z98.890]  Patient is known to our practice and receives outpatient dialysis treatments at [facility] on a MWF schedule, supervised by Dr. Holley Raring.  Patient did receive full treatment on Monday prior to procedure.  Patient is seen and evaluated at bedside in ICU.  Patient currently ventilated with light sedation, fentanyl.  Patient is arousable and able to answer questions appropriately with pen and paper.  Complains of discomfort from breathing tube.  According to chart review, patient was extubated postprocedure and experienced decreased tidal volumes, causing reintubation.  Primary team requesting dialysis treatment to manage pulmonary fluid, prior to reextubation.  Labs this morning unremarkable for renal patient.  Chest x-ray shows bilateral pleural effusions with mild pulmonary edema.  Discussed plan with patient, patient initially refusing dialysis. It was explained that ETT would not be removed until dialysis was completed, he was then agreeable.   Objective:  Vital signs in last 24 hours:  Temp:  [97.1 F (36.2 C)-97.9 F (36.6 C)] 97.1 F (36.2 C) (11/08 1303) Pulse Rate:  [65-93] 82 (11/08 1400) Resp:  [18-28] 20 (11/08 1400) BP: (101-166)/(62-97) 125/92 (11/08 1400) SpO2:  [100 %] 100 % (11/08 1400) FiO2 (%):  [35 %-40 %] 35 % (11/08 1155) Weight:  [75.3 kg-77.3 kg] 77.3 kg (11/08 1017)  Weight change:  Filed Weights   01/31/22  0627 01/31/22 1017  Weight: 75.3 kg 77.3 kg    Intake/Output: No intake/output data recorded.   Intake/Output this shift:  Total I/O In: 332.6 [I.V.:332.6] Out: 2 [Blood:2]  Physical Exam: General: NAD  Head: Normocephalic, atraumatic.   Eyes: Anicteric  Lungs:  Diffuse rhonchi Intubated on vent  Heart: Regular rate and rhythm  Abdomen:  Soft, nontender  Extremities: No peripheral edema.  Neurologic: Nonfocal, moving all four extremities  Skin: No lesions  Access: Right PermCath    Basic Metabolic Panel: Recent Labs  Lab 01/31/22 0637  NA 137  K 3.5  CL 91*  GLUCOSE 117*  BUN 40*  CREATININE 13.20*    Liver Function Tests: No results for input(s): "AST", "ALT", "ALKPHOS", "BILITOT", "PROT", "ALBUMIN" in the last 168 hours. No results for input(s): "LIPASE", "AMYLASE" in the last 168 hours. No results for input(s): "AMMONIA" in the last 168 hours.  CBC: Recent Labs  Lab 01/31/22 0637 01/31/22 1120  WBC  --  10.2  HGB 10.2* 7.1*  HCT 30.0* 22.2*  MCV  --  95.3  PLT  --  207    Cardiac Enzymes: No results for input(s): "CKTOTAL", "CKMB", "CKMBINDEX", "TROPONINI" in the last 168 hours.  BNP: Invalid input(s): "POCBNP"  CBG: Recent Labs  Lab 01/31/22 1021 01/31/22 1322  GLUCAP 101* 47    Microbiology: Results for orders placed or performed during the hospital encounter of 01/31/22  MRSA Next Gen by PCR, Nasal     Status: None   Collection Time: 01/31/22 10:19 AM   Specimen: Nasal Mucosa; Nasal Swab  Result Value Ref Range Status   MRSA by PCR Next Gen NOT DETECTED NOT DETECTED Final    Comment: (NOTE) The  GeneXpert MRSA Assay (FDA approved for NASAL specimens only), is one component of a comprehensive MRSA colonization surveillance program. It is not intended to diagnose MRSA infection nor to guide or monitor treatment for MRSA infections. Test performance is not FDA approved in patients less than 61 years old. Performed at [facility], [address] [place], [place] [address]     Coagulation Studies: No results for input(s): "LABPROT", "INR" in the last 72 hours.  Urinalysis: No results for input(s): "COLORURINE", "LABSPEC", "PHURINE", "GLUCOSEU", "HGBUR", "BILIRUBINUR", "KETONESUR", "PROTEINUR", "UROBILINOGEN", "NITRITE", "LEUKOCYTESUR" in the last 72 hours.  Invalid input(s): "APPERANCEUR"    Imaging: DG Chest Port 1 View  Result Date: 01/31/2022 CLINICAL DATA:  Intubated EXAM: PORTABLE CHEST 1 VIEW COMPARISON:  04/26/2021 FINDINGS: Endotracheal tube tip 6 cm above the carina. Right-sided central line tip at the SVC RA junction. Bilateral pleural effusions. Mild pulmonary edema. Atelectasis of the lower lungs. IMPRESSION: Endotracheal tube tip 6 cm above the carina. Bilateral pleural effusions. Mild pulmonary edema. Lower lung atelectasis. Electronically Signed   By: Nelson Chimes M.D.   On: 01/31/2022 11:08     Medications:    anticoagulant sodium citrate     fentaNYL infusion INTRAVENOUS 50 mcg/hr (01/31/22 1041)   propofol (DIPRIVAN) infusion 35 mcg/kg/min (01/31/22 1441)    Chlorhexidine Gluconate Cloth  6 each Topical Daily   docusate  100 mg Per Tube BID   famotidine  10 mg Per Tube QHS   mouth rinse  15 mL Mouth Rinse Q2H   polyethylene glycol  17 g Per Tube Daily   alteplase, anticoagulant sodium citrate, fentaNYL, heparin, lidocaine (PF), lidocaine-prilocaine, midazolam, mouth rinse, pentafluoroprop-tetrafluoroeth  Assessment/ Plan:  [name] is a 36 y.o.  male with past medical conditions including hypertension, GERD, anemia, recurrent pleural effusions status post left thoracotomy with decortication, CHF, chronic hypoxic respiratory requiring supplemental oxygen, and end-stage renal disease on hemodialysis.  Patient presents for scheduled procedure, PD catheter removal.  After successful procedure, patient extubated and reintubated due to respiratory failure.  Patient has  been admitted for Post-operative state [Z98.890]   End-stage renal disease with fluid overload on hemodialysis.  Will maintain outpatient schedule if possible.  Chest x-ray shows bilateral pleural effusions with pulmonary edema.  Failed extubation postoperatively, patient reintubated. Last dialysis treatment received on Monday.  We will plan dialysis treatment today, UF goal 1.5 to 2 L as tolerated.  Next treatment scheduled for Friday.  2. Anemia of chronic kidney disease Lab Results  Component Value Date   HGB 7.1 (L) 01/31/2022    Hemoglobin below desired target, 7.1.  Will defer to primary team for need of blood transfusion.  We will offer EPO with dialysis treatment.  3.Secondary Hyperparathyroidism: with outpatient labs: PTH 1658, phosphorus 6.9, calcium 8.6 on 01/01/22.    Lab Results  Component Value Date   CALCIUM 8.7 (L) 04/26/2021   CAION 1.03 (L) 01/31/2022   PHOS 7.3 (H) 04/26/2021  Calcium acetate prescribed with meals outpatient. Calcium within desired target.  We will continue to monitor.  4.  Hypertension with chronic kidney disease.  Home regimen includes amlodipine, carvedilol, hydralazine, and minoxidil.  All currently held.   LOS: 0   11/8/20232:54 PM

## 2022-01-31 NOTE — Progress Notes (Signed)
PT did 3.5 hrs of HD, tolerated tx well with no signs of distress  UF = 2000  Cvc heplocked and capped   01/31/22 1843  Vitals  Temp 97.6 F (36.4 C)  Temp Source Axillary  BP Location Right Arm  BP Method Automatic  Patient Position (if appropriate) Lying  Pulse Rate Source Monitor  Oxygen Therapy  O2 Device Ventilator  Post Treatment  Dialyzer Clearance Heavily streaked  Duration of HD Treatment -hour(s) 3.5 hour(s)  Hemodialysis Intake (mL) 0 mL  Liters Processed 74  Fluid Removed (mL) 2000 mL  Tolerated HD Treatment Yes  Post-Hemodialysis Comments Tolerated tx well, no signs of distress

## 2022-01-31 NOTE — Anesthesia Procedure Notes (Signed)
Procedure Name: Intubation Date/Time: 01/31/2022 7:51 AM  Performed by: Jacqualin Combes, CRNAPre-anesthesia Checklist: Patient identified, Emergency Drugs available, Suction available and Patient being monitored Patient Re-evaluated:Patient Re-evaluated prior to induction Oxygen Delivery Method: Circle system utilized Preoxygenation: Pre-oxygenation with 100% oxygen Induction Type: IV induction Ventilation: Mask ventilation without difficulty Tube type: Oral Number of attempts: 1 Airway Equipment and Method: Stylet and Oral airway Placement Confirmation: ETT inserted through vocal cords under direct vision, positive ETCO2 and breath sounds checked- equal and bilateral Tube secured with: Tape Dental Injury: Teeth and Oropharynx as per pre-operative assessment

## 2022-01-31 NOTE — H&P (Signed)
NAME:  Travis Long, MRN:  [id number], DOB:  April 11, 1985, LOS: 0 ADMISSION DATE:  01/31/2022, CONSULTATION DATE:  01/31/2022 REFERRING MD:  Dr. Hampton Abbot, CHIEF COMPLAINT:  Post-op Respiratory Failure   Brief Pt Description / Synopsis:  36 y.o. Male with PMH significant for ESRD on HD, chronic hypoxic respiratory failure (requires 1-3L supplemental O2) and recurrent pleural effusions s/p Left Thoracotomy with decortication, who underwent elective PD catheter removal on 01/31/22.  In PACU developed post-op Respiratory Failure requiring reintubation due to pulmonary edema & bilateral pleural effusions.  History of Present Illness:  Travis Long is a 36 year old male with a past medical history significant for end-stage renal disease on HD (M,W,F), chronic hypoxic respiratory requiring 1 to 3 L supplemental O2, recurrent pleural effusions s/p Left Thoracotomy with decortication, HFpEF, hypertension, GERD, anemia of chronic disease, and issues with medical noncompliance, who presented to 01/31/2022 for elective removal of peritoneal dialysis catheter.    Per chart review, the patient has previously been on hemodialysis, however there was issues with compliance and behavior, which ultimately resulted in him being banned from receiving HD near his home in the [place], [place] area.  Subsequently was receiving HD at [facility] here in [place], [place].  Therefore on 07/27/2021 he had peritoneal dialysis catheter placed laparoscopically and has made attempts at peritoneal dialysis.  Peritoneal dialysis course has been complicated by worsening of pleural effusions due to peritoneal dialysis fluid, and has since transitioned back to hemodialysis.  Thus decision was made to removal PD catheter.  PD catheter was removed without incident.  However in PACU, he developed post-op respiratory failure requiring reintubation.  PCCM is consulted for vent management.  Please see "Significant Hospital Events" section below for full  detailed hospital course.    Pertinent  Medical History   Past Medical History:  Diagnosis Date   Acute pulmonary edema (HCC)    Acute respiratory failure with hypoxia (HCC)    Anemia of chronic renal failure    a.) receiving epoetin alfa (EPOGEN) injections   CAP (community acquired pneumonia)    Diverticulosis    Dyspnea    Dyspnea on exertion    Encephalopathy    ESRD on hemodialysis ([place])    a.) banned by all [facility] owned/operated dialysis centers due to "behavioral issues"; [facility] provider petitioned for him to be re-accepted, but was denied. b.) currently on HD with a M-W-F schedule; plans are to transition to PD once catheter placed in 07/2021. c.) has been seen by [facility] renal transplant service; rejected due to medical non-compliance.   GERD (gastroesophageal reflux disease)    Gynecomastia    Heart failure with preserved ejection fraction ([place])    a.) TTE 03/09/2021: EF 60-65%; sev LVH; GLS -8.5%; mild LA dilation; mild TR/PR; G2DD.   Hiatal hernia    Hypertension    Major depression    Marijuana use    Medically noncompliant    a.) does not take antihypertensives as Rx. b.) misses scheduled HD Tx; c.) refuses medically necessary interventions as recommended by his providers (i.e. blood transfusions, medications). d.) involuntarily committed in 02/2021 due attempts to sign out AMA during acute illness; psychiatry consulted. e.) rejected by [facility] for consideration of renal transplant due to past non-compliance   Metabolic encephalopathy    On supplemental oxygen by nasal cannula    a.) 3-6 L/[place] on a PRN basis; mostly used with exertion   Procedural agitation 03/2021   a.) post-procedural agitation following IJ catheter placement in 03/2021; occurred  in setting of significant hypoxia, metabolic encephalopathy, and procedural sedation; states, "I was tied down and it caused me to spazz out".   Recurrent pleural effusion    a.) s/p mult thoracenteses with (+) sig fluid  yields: 2130 cc LEFT (05/12/2020), 1030 cc RIGHT (05/14/2020), 1200 cc LEFT 11/30/2020, 1200 cc RIGHT (12/01/2020), 750 cc LEFT (03/06/2021), 850 cc (03/08/2021), 330 cc LEFT (09/17/2021); b.) 03/08/2021 ->  LEFT 14 Fr pigtail cath to PleurEvac; c.) empyema dev req tube upsizing on 03/09/2021;  d.) s/p LEFT thoracotomy and decortication 09/21/2021   Splenomegaly    Tobacco use      Micro Data:  N/A  Antimicrobials:  N/A  Significant Hospital Events: Including procedures, antibiotic start and stop dates in addition to other pertinent events   11/8: Underwent elective PD catheter removal.  Developed Post-op respiratory failure requiring reintubation.  PCCM consulted for vent management.  Nephrology consulted.   Interim History / Subjective:  -Patient underwent elective PD catheter removal, procedure was uncomplicated -In PACU developed postop respiratory failure requiring reintubation -Transferred to ICU and PCCM consulted for vent management -Examined in ICU, patient is awake and alert, following commands, trying to write on clipboard to communicate -Placed and PSV, despite 20/5, patient with low tidal volumes ranging from 120 to 150 cc -Noted to have coarse crackles throughout with pink frothy sputum, suspect volume overload/pulmonary edema -Chest x-ray currently pending -Hemodynamically stable, no vasopressors -Consulting nephrology for dialysis  Objective   Blood pressure (!) 141/80, pulse 80, temperature 97.6 F (36.4 C), temperature source Axillary, resp. rate (!) 28, height _0  (1.727 m), weight 77.3 kg, SpO2 100 %.    Vent Mode: PRVC FiO2 (%):  [40 %] 40 % Set Rate:  [28 bmp] 28 bmp Vt Set:  [410 mL] 410 mL PEEP:  [5 cmH20] 5 cmH20 Plateau Pressure:  [33 cmH20] 33 cmH20   Intake/Output Summary (Last 24 hours) at 01/31/2022 1036 Last data filed at 01/31/2022 0818 Gross per 24 hour  Intake --  Output 2 ml  Net -2 ml   Filed Weights   01/31/22 0627 01/31/22 1017   Weight: 75.3 kg 77.3 kg    Examination: General: Acute on chronically ill-appearing male, laying in bed, intubated and awake, no acute distress HENT: Atraumatic, normocephalic, neck supple, positive JVD Lungs: Coarse crackles throughout, overbreathing the vent, even, mild tachypnea Cardiovascular: Regular rate and rhythm, S1-S2, no murmurs, rubs, gallops Abdomen: Soft, nontender, nondistended, no guarding or rebound tenderness, bowel sounds positive x4, abdominal incisions from PD cath removal well approximated with Dermabond, clean dry and intact Extremities: Normal bulk and tone, no deformities, 1+ edema Neuro: Awake and alert, difficult to assess orientation due to intubation, moves all extremities to command, no focal deficits, pupils PERRLA GU: Deferred, unclear if makes any urine with ESRD on HD  Resolved Hospital Problem list     Assessment & Plan:   Post-Op Respiratory Failure, suspect due to pulmonary edema/volume overload & Bilateral Pleural Effusions PMHx: Chronic Hypoxic Respiratory Failure requiring 1-2L of supplemental O2, Recurrent pleural effusions, s/p Left Thoracotomy in June 2023 -Full vent support, implement lung protective strategies -Plateau pressures less than 30 cm H20 -Wean FiO2 & PEEP as tolerated to maintain O2 sats >92% -Follow intermittent Chest X-ray & ABG as needed -Spontaneous Breathing Trials when respiratory parameters met and mental status permits -Implement VAP Bundle -Prn Bronchodilators -Volume removal with HD -Consider Thoracentesis  Acute on Chronic HFpEF PMHx: Hypertension Echocardiogram 03/09/21:  LVEF 60-65%, Grade II DD, RV  systolic function normal, small pericardial effusion -Continuous cardiac monitoring -Maintain MAP >65 -Vasopressors as needed to maintain MAP goal ~ not requiring pressors -Check BNP -Volume removal with HD  ESRD on HD (M,W,F) PD catheter removed 01/31/22 by General Surgery -Monitor I&O's / urinary  output -Follow BMP -Ensure adequate renal perfusion -Avoid nephrotoxic agents as able -Replace electrolytes as indicated -Consult nephrology, appreciate input ~HD as per nephrology  Anemia of chronic Disease -Monitor for S/Sx of bleeding -Trend CBC -SCD's for VTE Prophylaxis (can resume chemical VTE ppx 24 hrs following procedure) -Transfuse for Hgb <7  Sedation needs in setting of mechanical ventilation PMHx: Encephalopathy with anesthesia, depression, marijuana use -Maintain a RASS goal of 0 to -1 -Fentanyl and Propofol as needed to maintain RASS goal -Avoid sedating medications as able -Daily wake up assessment     Best Practice (right click and "Reselect all SmartList Selections" daily)   Diet/type: NPO DVT prophylaxis: SCD GI prophylaxis: H2B Lines: N/A Foley:  N/A Code Status:  full code Last date of multidisciplinary goals of care discussion [01/31/2022]  Labs   CBC: Recent Labs  Lab 01/31/22 0637  HGB 10.2*  HCT 30.0*    Basic Metabolic Panel: Recent Labs  Lab 01/31/22 0637  NA 137  K 3.5  CL 91*  GLUCOSE 117*  BUN 40*  CREATININE 13.20*   GFR: Estimated Creatinine Clearance: 7.6 mL/min (A) (by C-G formula based on SCr of 13.2 mg/dL (H)). No results for input(s): "PROCALCITON", "WBC", "LATICACIDVEN" in the last 168 hours.  Liver Function Tests: No results for input(s): "AST", "ALT", "ALKPHOS", "BILITOT", "PROT", "ALBUMIN" in the last 168 hours. No results for input(s): "LIPASE", "AMYLASE" in the last 168 hours. No results for input(s): "AMMONIA" in the last 168 hours.  ABG    Component Value Date/Time   HCO3 31.2 (H) 11/30/2020 1002   TCO2 33 (H) 01/31/2022 0637   ACIDBASEDEF 4.7 (H) 09/16/2020 0609   O2SAT 79.2 11/30/2020 1002     Coagulation Profile: No results for input(s): "INR", "PROTIME" in the last 168 hours.  Cardiac Enzymes: No results for input(s): "CKTOTAL", "CKMB", "CKMBINDEX", "TROPONINI" in the last 168  hours.  HbA1C: No results found for: "HGBA1C"  CBG: Recent Labs  Lab 01/31/22 1021  GLUCAP 101*    Review of Systems:   Unable to assess due to intubation   Past Medical History:  He,  has a past medical history of Acute pulmonary edema ([place]), Acute respiratory failure with hypoxia ([place]), Anemia of chronic renal failure, CAP (community acquired pneumonia), Diverticulosis, Dyspnea, Dyspnea on exertion, Encephalopathy, ESRD on hemodialysis ([place]), GERD (gastroesophageal reflux disease), Gynecomastia, Heart failure with preserved ejection fraction ([place]), Hiatal hernia, Hypertension, Major depression, Marijuana use, Medically noncompliant, Metabolic encephalopathy, On supplemental oxygen by nasal cannula, Procedural agitation (03/2021), Recurrent pleural effusion, Splenomegaly, and Tobacco use.   Surgical History:   Past Surgical History:  Procedure Laterality Date   A/V FISTULAGRAM Left 03/09/2021   Procedure: A/V Fistulagram;  Surgeon: Algernon Huxley, MD;  Location: [place] [facility];  Service: Cardiovascular;  Laterality: Left;   A/V FISTULAGRAM Left 07/06/2021   Procedure: A/V Fistulagram;  Surgeon: Algernon Huxley, MD;  Location: [place] [facility];  Service: Cardiovascular;  Laterality: Left;   AV FISTULA PLACEMENT Left    CAPD INSERTION N/A 07/27/2021   Procedure: LAPAROSCOPIC INSERTION CONTINUOUS AMBULATORY PERITONEAL DIALYSIS  (CAPD) CATHETER;  Surgeon: Olean Ree, MD;  Location: [facility];  Service: General;  Laterality: N/A;   DIALYSIS/PERMA CATHETER INSERTION  N/A 04/18/2021   Procedure: DIALYSIS/PERMA CATHETER INSERTION;  Surgeon: Katha Cabal, MD;  Location: [place] [facility];  Service: Cardiovascular;  Laterality: N/A;   IR CATHETER TUBE CHANGE  03/09/2021   THORACOTOMY / DECORTICATION PARIETAL PLEURA Left 09/21/2021   Procedure: THORACOTOMY / [place]; CRYO ABLATION; Location: [place]; Surgeon: Adonis Housekeeper, MD     Social History:    reports that he quit smoking about 2 years ago. His smoking use included cigarettes. He has been exposed to tobacco smoke. He has never used smokeless tobacco. He reports that he does not currently use alcohol. He reports that he does not use drugs.   Family History:  His family history includes Diabetes Mellitus II in his mother; Hypertension in his mother.   Allergies No Known Allergies   Home Medications  Prior to Admission medications   Medication Sig Start Date End Date Taking? Authorizing Provider  acetaminophen (TYLENOL) 500 MG tablet Take 2 tablets (1,000 mg total) by mouth every 6 (six) hours as needed for mild pain. 07/27/21  Yes Piscoya, Jacqulyn Bath, MD  acetaminophen (TYLENOL) 500 MG tablet Take 2 tablets (1,000 mg total) by mouth every 6 (six) hours as needed for mild pain. 01/31/22  Yes Piscoya, Jacqulyn Bath, MD  amLODipine (NORVASC) 10 MG tablet Take 1 tablet (10 mg total) by mouth daily. Patient taking differently: Take 10 mg by mouth every morning. 12/02/20  Yes Sharen Hones, MD  calcium acetate (PHOSLO) 667 MG capsule Take 2 capsules (1,334 mg total) by mouth 3 (three) times daily with meals. 03/10/21  Yes Nicole Kindred A, DO  carvedilol (COREG) 25 MG tablet Take 1 tablet (25 mg total) by mouth 2 (two) times daily with a meal. 03/15/21  Yes Gonfa, Charlesetta Ivory, MD  dicyclomine (BENTYL) 10 MG capsule Take 1 capsule (10 mg total) by mouth every 6 (six) hours as needed (nausea, emesis). 03/15/21  Yes Mercy Riding, MD  hydrALAZINE (APRESOLINE) 100 MG tablet Take 1 tablet (100 mg total) by mouth every 8 (eight) hours. 03/15/21  Yes Mercy Riding, MD  oxyCODONE (OXY IR/ROXICODONE) 5 MG immediate release tablet Take 1 tablet (5 mg total) by mouth every 6 (six) hours as needed for severe pain. 01/31/22  Yes Piscoya, Jacqulyn Bath, MD  OXYGEN Inhale 3 L into the lungs continuous.   Yes [provider]  minoxidil (LONITEN) 2.5 MG tablet Take 2 tablets (5 mg total) by mouth daily. Patient not taking:  Reported on 01/31/2022 12/03/20   Sharen Hones, MD     Critical care time: 50 minutes     Darel Hong, AGACNP-BC [facility] Prefer epic messenger for cross cover needs If after hours, please call E-link

## 2022-01-31 NOTE — Transfer of Care (Signed)
Immediate Anesthesia Transfer of Care Note  Patient: Travis Long  Procedure(s) Performed: LAPAROSCOPIC REMOVAL CONTINUOUS AMBULATORY PERITONEAL DIALYSIS  (CAPD) CATHETER  Patient Location: PACU and ICU  Anesthesia Type:General  Level of Consciousness: sedated  Airway & Oxygen Therapy: Patient remains intubated per anesthesia plan  Post-op Assessment: Report given to RN and Post -op Vital signs reviewed and stable  Post vital signs: stable  Last Vitals:  Vitals Value Taken Time  BP 141/80 01/31/22 1017  Temp 36.4 C 01/31/22 1017  Pulse 80 01/31/22 1027  Resp 15 01/31/22 1027  SpO2 100 % 01/31/22 1027  Vitals shown include unvalidated device data.  Last Pain:  Vitals:   01/31/22 1017  TempSrc: Axillary  PainSc:          Complications: No notable events documented.

## 2022-01-31 NOTE — Anesthesia Preprocedure Evaluation (Addendum)
Anesthesia Evaluation  Patient identified by MRN, date of birth, ID band Patient awake    Reviewed: Allergy & Precautions, NPO status , Patient's Chart, lab work & pertinent test results  Airway Mallampati: III  TM Distance: >3 FB Neck ROM: full    Dental no notable dental hx.    Pulmonary shortness of breath, with exertion and at rest, former smoker Recurrent pleural effusion     Comment:  a.) s/p LEFT thoracotomy and decortication 09/21/2021   Pulmonary exam normal        Cardiovascular Exercise Tolerance: Poor hypertension, + DOE  Normal cardiovascular exam  TTE 03/09/2021: EF 60-65%; sev LVH; GLS -8.5%; mild               LA dilation; mild TR/PR; G2DD   Neuro/Psych negative neurological ROS  negative psych ROS   GI/Hepatic Neg liver ROS, hiatal hernia,GERD  Controlled,,  Endo/Other  negative endocrine ROS    Renal/GU DialysisRenal diseaseLast HD monday     Musculoskeletal   Abdominal Normal abdominal exam  (+)   Peds  Hematology  (+) Blood dyscrasia, anemia   Anesthesia Other Findings PAT note reviewed in detail with detailed list of hospitalizations and test results. Pt states he is in normal state of health with normal about of oxygen requirements.   Past Medical History: No date: Acute pulmonary edema (HCC) No date: Acute respiratory failure with hypoxia (HCC) No date: Anemia of chronic renal failure     Comment:  a.) receiving epoetin alfa (EPOGEN) injections No date: CAP (community acquired pneumonia) No date: Diverticulosis No date: Dyspnea No date: Dyspnea on exertion No date: Encephalopathy No date: ESRD on hemodialysis ([place])     Comment:  a.) banned by all [facility] owned/operated dialysis               centers due to "behavioral issues"; [facility] provider               petitioned for him to be re-accepted, but was denied. b.)              currently on HD with a M-W-F schedule; plans are to                transition to PD once catheter placed in 07/2021. c.) has              been seen by [facility] renal transplant service; rejected due               to medical non-compliance. No date: GERD (gastroesophageal reflux disease) No date: Gynecomastia No date: Heart failure with preserved ejection fraction ([place])     Comment:  a.) TTE 03/09/2021: EF 60-65%; sev LVH; GLS -8.5%; mild               LA dilation; mild TR/PR; G2DD. No date: Hiatal hernia No date: Hypertension No date: Major depression No date: Marijuana use No date: Medically noncompliant     Comment:  a.) does not take antihypertensives as Rx. b.) misses               scheduled HD Tx; c.) refuses medically necessary               interventions as recommended by his providers (i.e. blood              transfusions, medications). d.) involuntarily committed               in 02/2021 due attempts  to sign out AMA during acute               illness; psychiatry consulted. e.) rejected by [facility] for               consideration of renal transplant due to past               non-compliance No date: Metabolic encephalopathy No date: On supplemental oxygen by nasal cannula     Comment:  a.) 3-6 L/[place] on a PRN basis; mostly used with exertion 03/2021: Procedural agitation     Comment:  a.) post-procedural agitation following IJ catheter               placement in 03/2021; occurred in setting of significant               hypoxia, metabolic encephalopathy, and procedural               sedation; states, "I was tied down and it caused me to               spazz out". No date: Recurrent pleural effusion     Comment:  a.) s/p mult thoracenteses with (+) sig fluid yields:               2130 cc LEFT (05/12/2020), 1030 cc RIGHT (05/14/2020),               1200 cc LEFT 11/30/2020, 1200 cc RIGHT (12/01/2020), 750               cc LEFT (03/06/2021), 850 cc (03/08/2021), 330 cc LEFT               (09/17/2021); b.) 03/08/2021 ->  LEFT 14 Fr pigtail cath                to PleurEvac; c.) empyema dev req tube upsizing on               03/09/2021;  d.) s/p LEFT thoracotomy and decortication               09/21/2021 No date: Splenomegaly No date: Tobacco use  Past Surgical History: 03/09/2021: A/V FISTULAGRAM; Left     Comment:  Procedure: A/V Fistulagram;  Surgeon: Algernon Huxley, MD;               Location: [place] [facility];  Service: Cardiovascular;              Laterality: Left; 07/06/2021: A/V FISTULAGRAM; Left     Comment:  Procedure: A/V Fistulagram;  Surgeon: Algernon Huxley, MD;               Location: [place] [facility];  Service: Cardiovascular;              Laterality: Left; No date: AV FISTULA PLACEMENT; Left 07/27/2021: CAPD INSERTION; N/A     Comment:  Procedure: LAPAROSCOPIC INSERTION CONTINUOUS AMBULATORY               PERITONEAL DIALYSIS  (CAPD) CATHETER;  Surgeon: Olean Ree, MD;  Location: [facility];  Service: General;                Laterality: N/A; 04/18/2021: DIALYSIS/PERMA CATHETER INSERTION; N/A     Comment:  Procedure: DIALYSIS/PERMA CATHETER INSERTION;  Surgeon:  Schnier, [name], MD;  Location: [place] [facility];               Service: Cardiovascular;  Laterality: N/A; 03/09/2021: IR CATHETER TUBE CHANGE 09/21/2021: THORACOTOMY / [place]; Left     Comment:  Procedure: THORACOTOMY / DECORTICATION PARIETAL PLEURA;               CRYO ABLATION; Location: [facility]; Surgeon: Adonis Housekeeper, MD  BMI    Body Mass Index: 25.24 kg/m      Reproductive/Obstetrics negative OB ROS                              Anesthesia Physical Anesthesia Plan  ASA: 3  Anesthesia Plan: General ETT   Post-op Pain Management: Tylenol PO (pre-op)* and Gabapentin PO (pre-op)*   Induction: Intravenous  PONV Risk Score and Plan: 2 and Ondansetron, Dexamethasone and Midazolam  Airway Management Planned: Oral ETT  Additional  Equipment:   Intra-op Plan:   Post-operative Plan: Extubation in OR  Informed Consent: I have reviewed the patients History and Physical, chart, labs and discussed the procedure including the risks, benefits and alternatives for the proposed anesthesia with the patient or authorized representative who has indicated his/her understanding and acceptance.     Dental Advisory Given  Plan Discussed with: Anesthesiologist, CRNA and Surgeon  Anesthesia Plan Comments:          Anesthesia Quick Evaluation

## 2022-01-31 NOTE — Interval H&P Note (Signed)
History and Physical Interval Note:  01/31/2022 7:13 AM  Travis Long  has presented today for surgery, with the diagnosis of PD catheter malfunction.  The various methods of treatment have been discussed with the patient and family. After consideration of risks, benefits and other options for treatment, the patient has consented to  Procedure(s): LAPAROSCOPIC REMOVAL CONTINUOUS AMBULATORY PERITONEAL DIALYSIS  (CAPD) CATHETER (N/A) as a surgical intervention.  The patient's history has been reviewed, patient examined, no change in status, stable for surgery.  I have reviewed the patient's chart and labs.  Questions were answered to the patient's satisfaction.     Travis Long

## 2022-01-31 NOTE — Op Note (Addendum)
  Procedure Date:  01/31/2022  Pre-operative Diagnosis:  Peritoneal dialysis catheter complication  Post-operative Diagnosis:  Peritoneal dialysis catheter complication  Procedure:  Excision of peritoneal dialysis catheter  Surgeon:  Melvyn Neth, MD  Anesthesia:  General endotracheal  Estimated Blood Loss:  5 ml  Specimens:  None  Complications:  None  Indications for Procedure:  This is a 36 y.o. male with a history of peritoneal dialysis catheter placement in the past.  He has had complications with worsening pleural effusions due to peritoneal dialysis fluid, and he has transitioned back to hemodialysis.  Now the peritoneal dialysis catheter can be removed.  The risks of bleeding, abscess or infection, potential for laparoscopy, and injury to surrounding structures were all discussed with the patient and he was willing to proceed.  Description of Procedure: The patient was correctly identified in the preoperative area and brought into the operating room.  The patient was placed supine with VTE prophylaxis in place.  Appropriate time-outs were performed.  Anesthesia was induced and the patient was intubated.  Appropriate antibiotics were infused.  The abdomen was prepped and draped in a sterile fashion.  The patient's prior insertion site and tunneling incisions were reopened using scalpel, and cautery was used to dissect down the subcutaneous tissue to the catheter itself.  At the insertion site, the capsule surrounding the catheter was incised and excised, exposing the catheter and titanium connector.  At the tunneling site, the capsule surrounding the catheter was also incised and excised, and cautery was used to free up both the proximal and distal cuffs off the subcutaneous tissue.  Then, the catheter was cut in half and the distal portion was removed through the skin exit site.  The proximal portion was then pulled back through the tunnel.  Then, the rectus sheath was mobilized  off the most proximal cuff and the catheter was pulled out without complication.  Then, the rectus sheath was closed using 0 Vicryl sutures.  Local anesthetic was infiltrated onto the fascia, subcutaneous tissue and skin of all incisions.  The two incisions were then closed in layers using 3-0 Vicryl and 4-0 Monocryl.  The wounds were cleaned and sealed with DermaBond.  The prior skin exit site was cleaned and a BandAid was applied for dressing.   The patient was emerged from anesthesia and extubated but was having pulmonary issues and was not waking up well or taking good tidal volumes, with a high end tidal CO2.  He was re-intubated and will be admitted to the ICU for further management.  This was discussed with ICU team and with the patient's family.  The patient otherwise tolerated the procedure well and all counts were correct at the end of the case.   Melvyn Neth, MD

## 2022-02-01 ENCOUNTER — Inpatient Hospital Stay: Payer: Medicare Other

## 2022-02-01 LAB — RENAL FUNCTION PANEL
Albumin: 2.8 g/dL — ABNORMAL LOW (ref 3.5–5.0)
Anion gap: 12 (ref 5–15)
BUN: 27 mg/dL — ABNORMAL HIGH (ref 6–20)
CO2: 28 mmol/L (ref 22–32)
Calcium: 8.8 mg/dL — ABNORMAL LOW (ref 8.9–10.3)
Chloride: 96 mmol/L — ABNORMAL LOW (ref 98–111)
Creatinine, Ser: 8.07 mg/dL — ABNORMAL HIGH (ref 0.61–1.24)
GFR, Estimated: 8 mL/min — ABNORMAL LOW (ref >60)
Glucose, Bld: 73 mg/dL (ref 70–99)
Phosphorus: 4 mg/dL (ref 2.5–4.6)
Potassium: 3.3 mmol/L — ABNORMAL LOW (ref 3.5–5.1)
Sodium: 136 mmol/L (ref 135–145)

## 2022-02-01 LAB — CBC
HCT: 23.1 % — ABNORMAL LOW (ref 39.0–52.0)
Hemoglobin: 7.3 g/dL — ABNORMAL LOW (ref 13.0–17.0)
MCH: 30.3 pg (ref 26.0–34.0)
MCHC: 31.6 g/dL (ref 30.0–36.0)
MCV: 95.9 fL (ref 80.0–100.0)
Platelets: 197 10*3/uL (ref 150–400)
RBC: 2.41 MIL/uL — ABNORMAL LOW (ref 4.22–5.81)
RDW: 13.6 % (ref 11.5–15.5)
WBC: 6.3 10*3/uL (ref 4.0–10.5)
nRBC: 0 % (ref 0.0–0.2)

## 2022-02-01 LAB — GLUCOSE, CAPILLARY
Glucose-Capillary: 71 mg/dL (ref 70–99)
Glucose-Capillary: 70 mg/dL (ref 70–99)
Glucose-Capillary: 89 mg/dL (ref 70–99)

## 2022-02-01 LAB — HEPATITIS B SURFACE ANTIBODY, QUANTITATIVE: Hep B S AB Quant (Post): 33.3 m[IU]/mL (ref >9.9)

## 2022-02-01 LAB — MAGNESIUM: Magnesium: 1.8 mg/dL (ref 1.7–2.4)

## 2022-02-01 LAB — TRIGLYCERIDES: Triglycerides: 188 mg/dL — ABNORMAL HIGH (ref <150)

## 2022-02-01 MED ORDER — CARVEDILOL 12.5 MG PO TABS
25.0000 mg | ORAL_TABLET | Freq: Two times a day (BID) | ORAL | Status: DC
  Administered 2022-02-02: 25 mg via ORAL
  Filled 2022-02-01: qty 2

## 2022-02-01 MED ORDER — LABETALOL HCL 5 MG/ML IV SOLN
10.0000 mg | INTRAVENOUS | Status: DC | PRN
  Administered 2022-02-01: 10 mg via INTRAVENOUS
  Filled 2022-02-01 (×2): qty 4

## 2022-02-01 MED ORDER — MORPHINE SULFATE (PF) 2 MG/ML IV SOLN
1.0000 mg | INTRAVENOUS | Status: DC | PRN
  Administered 2022-02-01: 2 mg via INTRAVENOUS
  Filled 2022-02-01: qty 1

## 2022-02-01 MED ORDER — AMLODIPINE BESYLATE 10 MG PO TABS
10.0000 mg | ORAL_TABLET | ORAL | Status: DC
  Administered 2022-02-02: 10 mg via ORAL
  Filled 2022-02-01: qty 1

## 2022-02-01 MED ORDER — DICYCLOMINE HCL 10 MG PO CAPS: 10.0000 mg | ORAL_CAPSULE | Freq: Four times a day (QID) | ORAL | Status: DC | PRN

## 2022-02-01 MED ORDER — AMLODIPINE BESYLATE 10 MG PO TABS: 10.0000 mg | ORAL_TABLET | ORAL | Status: DC

## 2022-02-01 MED ORDER — CALCIUM ACETATE (PHOS BINDER) 667 MG PO CAPS: 1334.0000 mg | ORAL_CAPSULE | Freq: Three times a day (TID) | ORAL | Status: DC

## 2022-02-01 MED ORDER — OXYCODONE-ACETAMINOPHEN 5-325 MG PO TABS
1.0000 | ORAL_TABLET | ORAL | Status: DC | PRN
  Administered 2022-02-01 – 2022-02-02 (×2): 2 via ORAL
  Filled 2022-02-01: qty 2
  Filled 2022-02-01 (×2): qty 1

## 2022-02-01 MED ORDER — CARVEDILOL 12.5 MG PO TABS
25.0000 mg | ORAL_TABLET | Freq: Two times a day (BID) | ORAL | Status: DC
  Administered 2022-02-01: 25 mg
  Filled 2022-02-01: qty 2

## 2022-02-01 MED ORDER — CALCIUM ACETATE (PHOS BINDER) 667 MG PO CAPS
1334.0000 mg | ORAL_CAPSULE | Freq: Three times a day (TID) | ORAL | Status: DC
  Administered 2022-02-01 – 2022-02-02 (×2): 1334 mg via ORAL
  Filled 2022-02-01 (×4): qty 2

## 2022-02-01 MED ORDER — HEPARIN SODIUM (PORCINE) 1000 UNIT/ML IJ SOLN
INTRAMUSCULAR | Status: AC
  Filled 2022-02-01: qty 10

## 2022-02-01 MED ORDER — HYDRALAZINE HCL 50 MG PO TABS
100.0000 mg | ORAL_TABLET | Freq: Three times a day (TID) | ORAL | Status: DC
  Administered 2022-02-01 – 2022-02-02 (×2): 100 mg via ORAL
  Filled 2022-02-01 (×2): qty 2

## 2022-02-01 NOTE — Progress Notes (Signed)
02/01/2022  Subjective: Patient is 1 Day Post-Op s/p open PD catheter removal.  Had complication of respiratory failure requiring re-intubation and admission to ICU.  Overnight, no acute events.  He's awake and able to interact and write messages on a clipboard.    Vital signs: Temp:  [97.1 F (36.2 C)-98.4 F (36.9 C)] 97.9 F (36.6 C) (11/09 0400) Pulse Rate:  [65-85] 69 (11/09 0600) Resp:  [17-29] 22 (11/09 0600) BP: (101-141)/(62-92) 114/87 (11/09 0600) SpO2:  [100 %] 100 % (11/09 0600) FiO2 (%):  [30 %-40 %] 30 % (11/09 0400) Weight:  [77.3 kg] 77.3 kg (11/08 1017)   Intake/Output: 11/08 0701 - 11/09 0700 In: 756.2 [I.V.:706.2; NG/GT:50] Out: 2002 [Blood:2] Last BM Date :  (PTA)  Physical Exam: Constitutional:  No acute distress, sedated Pulm:  on vent, currently PRVC rate 22, TV 410, PEEP 5 Abdomen:  soft, non-distended, non-tender.  Incisions clean, dry, intact.  Labs:  Recent Labs    01/31/22 1120 02/01/22 0457  WBC 10.2 6.3  HGB 7.1* 7.3*  HCT 22.2* 23.1*  PLT 207 197   Recent Labs    01/31/22 0637 02/01/22 0457  NA 137 136  K 3.5 3.3*  CL 91* 96*  CO2  --  28  GLUCOSE 117* 73  BUN 40* 27*  CREATININE 13.20* 8.07*  CALCIUM  --  8.8*   No results for input(s): "LABPROT", "INR" in the last 72 hours.  Imaging: DG Abd 1 View  Result Date: 01/31/2022 CLINICAL DATA:  OG tube placement EXAM: ABDOMEN - 1 VIEW COMPARISON:  None Available. FINDINGS: Nonobstructive bowel gas pattern. Enteric tube terminates in the proximal gastric body. Small bilateral pleural effusions. IMPRESSION: Enteric tube terminates in the proximal gastric body. Electronically Signed   By: Julian Hy M.D.   On: 01/31/2022 19:15   DG Chest Port 1 View  Result Date: 01/31/2022 CLINICAL DATA:  Intubated EXAM: PORTABLE CHEST 1 VIEW COMPARISON:  04/26/2021 FINDINGS: Endotracheal tube tip 6 cm above the carina. Right-sided central line tip at the SVC RA junction. Bilateral pleural  effusions. Mild pulmonary edema. Atelectasis of the lower lungs. IMPRESSION: Endotracheal tube tip 6 cm above the carina. Bilateral pleural effusions. Mild pulmonary edema. Lower lung atelectasis. Electronically Signed   By: Nelson Chimes M.D.   On: 01/31/2022 11:08    Assessment/Plan: This is a 36 y.o. male s/p open PD catheter removal.  --Discussed with ICU team, plans for SBT trials today and possible extubation. --If unable to extubate, ok to start tube feeds via OG tube.   Melvyn Neth, [facility]

## 2022-02-01 NOTE — Progress Notes (Signed)
Patient pulled off dressing on hemodialysis catheter because "it was itchy". Nurse entered patient's room and found catheter exposed without dressing in place. Tegaderm placed and stat chest x-ray performed to confirm placement. Patient refused nurse's attempt to re-dress catheter with sterile supplies. He states he's "going home in the morning and doesn't even want dialysis tomorrow". Patient refusing all nursing care at this time; he "doesn't want to be touched". Patient educated by nurse and NP who is at bedside.

## 2022-02-01 NOTE — Progress Notes (Signed)
[facility]  ROUNDING NOTE   Subjective:   Travis Long is a 36 year old male with past medical conditions including hypertension, GERD, anemia, recurrent pleural effusions status post left thoracotomy with decortication, CHF, chronic hypoxic respiratory requiring supplemental oxygen, and end-stage renal disease on hemodialysis.  Patient presents for scheduled procedure, PD catheter removal.  After successful procedure, patient extubated and reintubated due to respiratory failure.  Patient has been admitted for Post-operative state [Z98.890]  Patient is known to our practice and receives outpatient dialysis treatments at [facility] on a MWF schedule, supervised by Dr. Holley Raring.    Patient seen and evaluated at bedside in ICU Remains intubated on vent at 28% FiO2 Frustrated No sedation, lost IV access   Objective:  Vital signs in last 24 hours:  Temp:  [97.1 F (36.2 C)-98.4 F (36.9 C)] 98.1 F (36.7 C) (11/09 0737) Pulse Rate:  [65-85] 77 (11/09 0800) Resp:  [17-29] 27 (11/09 0800) BP: (101-144)/(62-109) 144/109 (11/09 0800) SpO2:  [100 %] 100 % (11/09 0800) FiO2 (%):  [28 %-40 %] 28 % (11/09 0749) Weight:  [77.3 kg] 77.3 kg (11/08 1017)  Weight change: 2.003 kg Filed Weights   01/31/22 0627 01/31/22 1017  Weight: 75.3 kg 77.3 kg    Intake/Output: I/O last 3 completed shifts: In: 756.2 [I.V.:706.2; NG/GT:50] Out: 2002 [Other:2000; Blood:2]   Intake/Output this shift:  No intake/output data recorded.  Physical Exam: General: NAD  Head: Normocephalic, atraumatic.   Eyes: Anicteric  Lungs:  Intubated on vent  Heart: Regular rate and rhythm  Abdomen:  Soft, nontender  Extremities: No peripheral edema.  Neurologic: Alert, moving all four extremities  Skin: No lesions  Access: Right PermCath    Basic Metabolic Panel: Recent Labs  Lab 01/31/22 0637 02/01/22 0457  NA 137 136  K 3.5 3.3*  CL 91* 96*  CO2  --  28  GLUCOSE 117* 73  BUN 40*  27*  CREATININE 13.20* 8.07*  CALCIUM  --  8.8*  MG  --  1.8  PHOS  --  4.0     Liver Function Tests: Recent Labs  Lab 02/01/22 0457  ALBUMIN 2.8*   No results for input(s): "LIPASE", "AMYLASE" in the last 168 hours. No results for input(s): "AMMONIA" in the last 168 hours.  CBC: Recent Labs  Lab 01/31/22 0637 01/31/22 1120 02/01/22 0457  WBC  --  10.2 6.3  HGB 10.2* 7.1* 7.3*  HCT 30.0* 22.2* 23.1*  MCV  --  95.3 95.9  PLT  --  207 197     Cardiac Enzymes: No results for input(s): "CKTOTAL", "CKMB", "CKMBINDEX", "TROPONINI" in the last 168 hours.  BNP: Invalid input(s): "POCBNP"  CBG: Recent Labs  Lab 01/31/22 1619 01/31/22 2015 01/31/22 2329 02/01/22 0351 02/01/22 0757  GLUCAP 79 78 78 71 70     Microbiology: Results for orders placed or performed during the hospital encounter of 01/31/22  MRSA Next Gen by PCR, Nasal     Status: None   Collection Time: 01/31/22 10:19 AM   Specimen: Nasal Mucosa; Nasal Swab  Result Value Ref Range Status   MRSA by PCR Next Gen NOT DETECTED NOT DETECTED Final    Comment: (NOTE) The GeneXpert MRSA Assay (FDA approved for NASAL specimens only), is one component of a comprehensive MRSA colonization surveillance program. It is not intended to diagnose MRSA infection nor to guide or monitor treatment for MRSA infections. Test performance is not FDA approved in patients less than 34 years old. Performed  at [facility], [address] [place], [place] [address]     Coagulation Studies: No results for input(s): "LABPROT", "INR" in the last 72 hours.  Urinalysis: No results for input(s): "COLORURINE", "LABSPEC", "PHURINE", "GLUCOSEU", "HGBUR", "BILIRUBINUR", "KETONESUR", "PROTEINUR", "UROBILINOGEN", "NITRITE", "LEUKOCYTESUR" in the last 72 hours.  Invalid input(s): "APPERANCEUR"    Imaging: DG Chest Port 1 View  Result Date: 02/01/2022 CLINICAL DATA:  Respiratory failure, hypoxia EXAM: PORTABLE CHEST 1  VIEW COMPARISON:  Previous studies including the examination of 01/31/2022 FINDINGS: Transverse diameter of heart is increased. Tip of endotracheal tube is 6.7 cm above the carina. Enteric tube is noted traversing the esophagus. Tip of dialysis catheter is seen in superior vena cava close to the right atrium. Moderate bilateral pleural effusions are seen with no significant interval change. Linear densities are seen in left parahilar region and both lower lung fields suggesting atelectasis/pneumonia. There is no pneumothorax. IMPRESSION: Moderate bilateral pleural effusions with no significant interval change. Part of the increased density in both lower lung fields may be due to underlying atelectasis/pneumonia. There is new linear density in left parahilar region which may be due to loculated effusion or atelectasis. Electronically Signed   By: Elmer Picker M.D.   On: 02/01/2022 08:20   DG Abd 1 View  Result Date: 01/31/2022 CLINICAL DATA:  OG tube placement EXAM: ABDOMEN - 1 VIEW COMPARISON:  None Available. FINDINGS: Nonobstructive bowel gas pattern. Enteric tube terminates in the proximal gastric body. Small bilateral pleural effusions. IMPRESSION: Enteric tube terminates in the proximal gastric body. Electronically Signed   By: Julian Hy M.D.   On: 01/31/2022 19:15   DG Chest Port 1 View  Result Date: 01/31/2022 CLINICAL DATA:  Intubated EXAM: PORTABLE CHEST 1 VIEW COMPARISON:  04/26/2021 FINDINGS: Endotracheal tube tip 6 cm above the carina. Right-sided central line tip at the SVC RA junction. Bilateral pleural effusions. Mild pulmonary edema. Atelectasis of the lower lungs. IMPRESSION: Endotracheal tube tip 6 cm above the carina. Bilateral pleural effusions. Mild pulmonary edema. Lower lung atelectasis. Electronically Signed   By: Nelson Chimes M.D.   On: 01/31/2022 11:08     Medications:    anticoagulant sodium citrate     fentaNYL infusion INTRAVENOUS 25 mcg/hr (02/01/22 0739)    propofol (DIPRIVAN) infusion 20 mcg/kg/min (02/01/22 0737)    Chlorhexidine Gluconate Cloth  6 each Topical Daily   docusate  100 mg Per Tube BID   epoetin (EPOGEN/PROCRIT) injection  10,000 Units Intravenous Q M,W,F-HD   famotidine  10 mg Per Tube QHS   heparin sodium (porcine)       mouth rinse  15 mL Mouth Rinse Q2H   polyethylene glycol  17 g Per Tube Daily   alteplase, anticoagulant sodium citrate, fentaNYL, heparin, heparin sodium (porcine), lidocaine (PF), lidocaine-prilocaine, midazolam, mouth rinse, pentafluoroprop-tetrafluoroeth  Assessment/ Plan:  Mr. Johntay Doolen is a 36 y.o.  male with past medical conditions including hypertension, GERD, anemia, recurrent pleural effusions status post left thoracotomy with decortication, CHF, chronic hypoxic respiratory requiring supplemental oxygen, and end-stage renal disease on hemodialysis.  Patient presents for scheduled procedure, PD catheter removal.  After successful procedure, patient extubated and reintubated due to respiratory failure.  Patient has been admitted for Post-operative state [Z98.890]   End-stage renal disease with fluid overload on hemodialysis.  Will maintain outpatient schedule if possible.  Chest x-ray shows bilateral pleural effusions with pulmonary edema.  Failed extubation postoperatively, patient reintubated.  Received dialysis yesterday, UF 2L achieved. Critical care team requested  additional treatment today for fluid removal. Will perform UF only treatment, goal 2-2.5L as tolerated. Scheduled treatment for Friday to maintain outpatient schedule.  2. Anemia of chronic kidney disease Lab Results  Component Value Date   HGB 7.3 (L) 02/01/2022    Hemoglobin below desired target, but slightly improved 7.3.  Will defer to primary team for need of blood transfusion.  Continue EPO with dialysis treatments.   3.Secondary Hyperparathyroidism: with outpatient labs: PTH 1658, phosphorus 6.9, calcium 8.6 on 01/01/22.     Lab Results  Component Value Date   CALCIUM 8.8 (L) 02/01/2022   CAION 1.03 (L) 01/31/2022   PHOS 4.0 02/01/2022  Calcium acetate prescribed with meals outpatient. Will continue to monitor bone minerals during this admission.   4.  Hypertension with chronic kidney disease.  Home regimen includes amlodipine, carvedilol, hydralazine, and minoxidil.  All currently held.   LOS: 1   11/9/202310:07 AM

## 2022-02-01 NOTE — Progress Notes (Signed)
NAME:  Travis Long, MRN:  [id number], DOB:  1985/07/18, LOS: 1 ADMISSION DATE:  01/31/2022, CONSULTATION DATE:  01/31/2022 REFERRING MD:  Dr. Hampton Abbot, CHIEF COMPLAINT:  Post-op Respiratory Failure   Brief Pt Description / Synopsis:  36 y.o. Male with PMH significant for ESRD on HD, chronic hypoxic respiratory failure (requires 1-3L supplemental O2) and recurrent pleural effusions s/p Left Thoracotomy with decortication, who underwent elective PD catheter removal on 01/31/22.  In PACU developed post-op Respiratory Failure requiring reintubation due to pulmonary edema & bilateral pleural effusions.  History of Present Illness:  Travis Long is a 36 year old male with a past medical history significant for end-stage renal disease on HD (M,W,F), chronic hypoxic respiratory requiring 1 to 3 L supplemental O2, recurrent pleural effusions s/p Left Thoracotomy with decortication, HFpEF, hypertension, GERD, anemia of chronic disease, and issues with medical noncompliance, who presented to 01/31/2022 for elective removal of peritoneal dialysis catheter.    Per chart review, the patient has previously been on hemodialysis, however there was issues with compliance and behavior, which ultimately resulted in him being banned from receiving HD near his home in the [place], [place] area.  Subsequently was receiving HD at [facility] here in [place], [place].  Therefore on 07/27/2021 he had peritoneal dialysis catheter placed laparoscopically and has made attempts at peritoneal dialysis.  Peritoneal dialysis course has been complicated by worsening of pleural effusions due to peritoneal dialysis fluid, and has since transitioned back to hemodialysis.  Thus decision was made to removal PD catheter.  02/01/22- patient is for HD today with plan for extubation after dialysis.    Pertinent  Medical History   Past Medical History:  Diagnosis Date   Acute pulmonary edema (HCC)    Acute respiratory failure with hypoxia (HCC)     Anemia of chronic renal failure    a.) receiving epoetin alfa (EPOGEN) injections   CAP (community acquired pneumonia)    Diverticulosis    Dyspnea    Dyspnea on exertion    Encephalopathy    ESRD on hemodialysis ([place])    a.) banned by all [facility] owned/operated dialysis centers due to "behavioral issues"; [facility] provider petitioned for him to be re-accepted, but was denied. b.) currently on HD with a M-W-F schedule; plans are to transition to PD once catheter placed in 07/2021. c.) has been seen by [facility] renal transplant service; rejected due to medical non-compliance.   GERD (gastroesophageal reflux disease)    Gynecomastia    Heart failure with preserved ejection fraction ([place])    a.) TTE 03/09/2021: EF 60-65%; sev LVH; GLS -8.5%; mild LA dilation; mild TR/PR; G2DD.   Hiatal hernia    Hypertension    Major depression    Marijuana use    Medically noncompliant    a.) does not take antihypertensives as Rx. b.) misses scheduled HD Tx; c.) refuses medically necessary interventions as recommended by his providers (i.e. blood transfusions, medications). d.) involuntarily committed in 02/2021 due attempts to sign out AMA during acute illness; psychiatry consulted. e.) rejected by [facility] for consideration of renal transplant due to past non-compliance   Metabolic encephalopathy    On supplemental oxygen by nasal cannula    a.) 3-6 L/ on a PRN basis; mostly used with exertion   Procedural agitation 03/2021   a.) post-procedural agitation following IJ catheter placement in 03/2021; occurred in setting of significant hypoxia, metabolic encephalopathy, and procedural sedation; states, "I was tied down and it caused me to spazz out".   Recurrent  pleural effusion    a.) s/p mult thoracenteses with (+) sig fluid yields: 2130 cc LEFT (05/12/2020), 1030 cc RIGHT (05/14/2020), 1200 cc LEFT 11/30/2020, 1200 cc RIGHT (12/01/2020), 750 cc LEFT (03/06/2021), 850 cc (03/08/2021), 330 cc LEFT (09/17/2021);  b.) 03/08/2021 ->  LEFT 14 Fr pigtail cath to PleurEvac; c.) empyema dev req tube upsizing on 03/09/2021;  d.) s/p LEFT thoracotomy and decortication 09/21/2021   Splenomegaly    Tobacco use      Micro Data:  N/A  Antimicrobials:  N/A  Significant Hospital Events: Including procedures, antibiotic start and stop dates in addition to other pertinent events   11/8: Underwent elective PD catheter removal.  Developed Post-op respiratory failure requiring reintubation.  PCCM consulted for vent management.  Nephrology consulted.    Objective   Blood pressure (!) 144/109, pulse 77, temperature 98.1 F (36.7 C), resp. rate (!) 27, height _0  (1.727 m), weight 77.3 kg, SpO2 100 %.    Vent Mode: PRVC FiO2 (%):  [28 %-35 %] 28 % Set Rate:  [22 bmp-28 bmp] 22 bmp Vt Set:  [410 mL] 410 mL PEEP:  [5 cmH20] 5 cmH20 Plateau Pressure:  [35 cmH20] 35 cmH20   Intake/Output Summary (Last 24 hours) at 02/01/2022 1030 Last data filed at 02/01/2022 0600 Gross per 24 hour  Intake 456.17 ml  Output 2000 ml  Net -1543.83 ml    Filed Weights   01/31/22 0627 01/31/22 1017  Weight: 75.3 kg 77.3 kg    Examination: General: Acute on chronically ill-appearing male, laying in bed, intubated and awake, no acute distress HENT: Atraumatic, normocephalic, neck supple, positive JVD Lungs: Coarse crackles throughout, overbreathing the vent, even, mild tachypnea Cardiovascular: Regular rate and rhythm, S1-S2, no murmurs, rubs, gallops Abdomen: Soft, nontender, nondistended, no guarding or rebound tenderness, bowel sounds positive x4, abdominal incisions from PD cath removal well approximated with Dermabond, clean dry and intact Extremities: Normal bulk and tone, no deformities, 1+ edema Neuro: Awake and alert, difficult to assess orientation due to intubation, moves all extremities to command, no focal deficits, pupils PERRLA GU: Deferred, unclear if makes any urine with ESRD on HD  Resolved Hospital  Problem list     Assessment & Plan:   Post-Op Respiratory Failure, suspect due to pulmonary edema/volume overload & Bilateral Pleural Effusions PMHx: Chronic Hypoxic Respiratory Failure requiring 1-2L of supplemental O2, Recurrent pleural effusions, s/p Left Thoracotomy in June 2023 -Full vent support, implement lung protective strategies -Plateau pressures less than 30 cm H20 -Wean FiO2 & PEEP as tolerated to maintain O2 sats >92% -Follow intermittent Chest X-ray & ABG as needed -Spontaneous Breathing Trials when respiratory parameters met and mental status permits -Implement VAP Bundle -Prn Bronchodilators -Volume removal with HD -Consider Thoracentesis  Acute on Chronic HFpEF PMHx: Hypertension Echocardiogram 03/09/21:  LVEF 60-65%, Grade II DD, RV systolic function normal, small pericardial effusion -Continuous cardiac monitoring -Maintain MAP >65 -Vasopressors as needed to maintain MAP goal ~ not requiring pressors -Check BNP -Volume removal with HD  ESRD on HD (M,W,F) PD catheter removed 01/31/22 by General Surgery -Monitor I&O's / urinary output -Follow BMP -Ensure adequate renal perfusion -Avoid nephrotoxic agents as able -Replace electrolytes as indicated -Consult nephrology, appreciate input ~HD as per nephrology  Anemia of chronic Disease -Monitor for S/Sx of bleeding -Trend CBC -SCD's for VTE Prophylaxis (can resume chemical VTE ppx 24 hrs following procedure) -Transfuse for Hgb <7  Sedation needs in setting of mechanical ventilation PMHx: Encephalopathy with anesthesia, depression, marijuana use -Maintain  a RASS goal of 0 to -1 -Fentanyl and Propofol as needed to maintain RASS goal -Avoid sedating medications as able -Daily wake up assessment     Best Practice (right click and "Reselect all SmartList Selections" daily)   Diet/type: NPO DVT prophylaxis: SCD GI prophylaxis: H2B Lines: N/A Foley:  N/A Code Status:  full code Last date of  multidisciplinary goals of care discussion [01/31/2022]  Labs   CBC: Recent Labs  Lab 01/31/22 0637 01/31/22 1120 02/01/22 0457  WBC  --  10.2 6.3  HGB 10.2* 7.1* 7.3*  HCT 30.0* 22.2* 23.1*  MCV  --  95.3 95.9  PLT  --  207 197     Basic Metabolic Panel: Recent Labs  Lab 01/31/22 0637 02/01/22 0457  NA 137 136  K 3.5 3.3*  CL 91* 96*  CO2  --  28  GLUCOSE 117* 73  BUN 40* 27*  CREATININE 13.20* 8.07*  CALCIUM  --  8.8*  MG  --  1.8  PHOS  --  4.0    GFR: Estimated Creatinine Clearance: 12.5 mL/min (A) (by C-G formula based on SCr of 8.07 mg/dL (H)). Recent Labs  Lab 01/31/22 1120 02/01/22 0457  WBC 10.2 6.3    Liver Function Tests: Recent Labs  Lab 02/01/22 0457  ALBUMIN 2.8*   No results for input(s): "LIPASE", "AMYLASE" in the last 168 hours. No results for input(s): "AMMONIA" in the last 168 hours.  ABG    Component Value Date/Time   PHART 7.51 (H) 01/31/2022 1158   PCO2ART 43 01/31/2022 1158   PO2ART 129 (H) 01/31/2022 1158   HCO3 34.3 (H) 01/31/2022 1158   TCO2 33 (H) 01/31/2022 0637   ACIDBASEDEF 4.7 (H) 09/16/2020 0609   O2SAT 100 01/31/2022 1158     Coagulation Profile: No results for input(s): "INR", "PROTIME" in the last 168 hours.  Cardiac Enzymes: No results for input(s): "CKTOTAL", "CKMB", "CKMBINDEX", "TROPONINI" in the last 168 hours.  HbA1C: No results found for: "HGBA1C"  CBG: Recent Labs  Lab 01/31/22 1619 01/31/22 2015 01/31/22 2329 02/01/22 0351 02/01/22 0757  GLUCAP 79 78 78 71 70     Review of Systems:   Unable to assess due to intubation   Past Medical History:  He,  has a past medical history of Acute pulmonary edema ([place]), Acute respiratory failure with hypoxia ([place]), Anemia of chronic renal failure, CAP (community acquired pneumonia), Diverticulosis, Dyspnea, Dyspnea on exertion, Encephalopathy, ESRD on hemodialysis ([place]), GERD (gastroesophageal reflux disease), Gynecomastia, Heart failure with  preserved ejection fraction ([place]), Hiatal hernia, Hypertension, Major depression, Marijuana use, Medically noncompliant, Metabolic encephalopathy, On supplemental oxygen by nasal cannula, Procedural agitation (03/2021), Recurrent pleural effusion, Splenomegaly, and Tobacco use.   Surgical History:   Past Surgical History:  Procedure Laterality Date   A/V FISTULAGRAM Left 03/09/2021   Procedure: A/V Fistulagram;  Surgeon: Algernon Huxley, MD;  Location: [place] [facility];  Service: Cardiovascular;  Laterality: Left;   A/V FISTULAGRAM Left 07/06/2021   Procedure: A/V Fistulagram;  Surgeon: Algernon Huxley, MD;  Location: [place];  Service: Cardiovascular;  Laterality: Left;   AV FISTULA PLACEMENT Left    CAPD INSERTION N/A 07/27/2021   Procedure: LAPAROSCOPIC INSERTION CONTINUOUS AMBULATORY PERITONEAL DIALYSIS  (CAPD) CATHETER;  Surgeon: Olean Ree, MD;  Location: [facility];  Service: General;  Laterality: N/A;   CAPD REMOVAL N/A 01/31/2022   Procedure: LAPAROSCOPIC REMOVAL CONTINUOUS AMBULATORY PERITONEAL DIALYSIS  (CAPD) CATHETER;  Surgeon: Olean Ree, MD;  Location:  [facility];  Service: General;  Laterality: N/A;   DIALYSIS/PERMA CATHETER INSERTION N/A 04/18/2021   Procedure: DIALYSIS/PERMA CATHETER INSERTION;  Surgeon: Katha Cabal, MD;  Location: [facility];  Service: Cardiovascular;  Laterality: N/A;   IR CATHETER TUBE CHANGE  03/09/2021   THORACOTOMY / DECORTICATION PARIETAL PLEURA Left 09/21/2021   Procedure: THORACOTOMY / [place]; CRYO ABLATION; Location: [place]; Surgeon: Adonis Housekeeper, MD     Social History:   reports that he quit smoking about 2 years ago. His smoking use included cigarettes. He has been exposed to tobacco smoke. He has never used smokeless tobacco. He reports that he does not currently use alcohol. He reports that he does not use drugs.   Family History:  His family history includes Diabetes Mellitus II in  his mother; Hypertension in his mother.   Allergies No Known Allergies   Home Medications  Prior to Admission medications   Medication Sig Start Date End Date Taking? Authorizing Provider  acetaminophen (TYLENOL) 500 MG tablet Take 2 tablets (1,000 mg total) by mouth every 6 (six) hours as needed for mild pain. 07/27/21  Yes Piscoya, Jacqulyn Bath, MD  acetaminophen (TYLENOL) 500 MG tablet Take 2 tablets (1,000 mg total) by mouth every 6 (six) hours as needed for mild pain. 01/31/22  Yes Piscoya, Jacqulyn Bath, MD  amLODipine (NORVASC) 10 MG tablet Take 1 tablet (10 mg total) by mouth daily. Patient taking differently: Take 10 mg by mouth every morning. 12/02/20  Yes Sharen Hones, MD  calcium acetate (PHOSLO) 667 MG capsule Take 2 capsules (1,334 mg total) by mouth 3 (three) times daily with meals. 03/10/21  Yes Nicole Kindred A, DO  carvedilol (COREG) 25 MG tablet Take 1 tablet (25 mg total) by mouth 2 (two) times daily with a meal. 03/15/21  Yes Gonfa, Charlesetta Ivory, MD  dicyclomine (BENTYL) 10 MG capsule Take 1 capsule (10 mg total) by mouth every 6 (six) hours as needed (nausea, emesis). 03/15/21  Yes Mercy Riding, MD  hydrALAZINE (APRESOLINE) 100 MG tablet Take 1 tablet (100 mg total) by mouth every 8 (eight) hours. 03/15/21  Yes Mercy Riding, MD  oxyCODONE (OXY IR/ROXICODONE) 5 MG immediate release tablet Take 1 tablet (5 mg total) by mouth every 6 (six) hours as needed for severe pain. 01/31/22  Yes Piscoya, Jacqulyn Bath, MD  OXYGEN Inhale 3 L into the lungs continuous.   Yes [provider]  minoxidil (LONITEN) 2.5 MG tablet Take 2 tablets (5 mg total) by mouth daily. Patient not taking: Reported on 01/31/2022 12/03/20   Sharen Hones, MD     Critical care provider statement:   Total critical care time: 33 minutes   Performed by: Lanney Gins MD   Critical care time was exclusive of separately billable procedures and treating other patients.   Critical care was necessary to treat or prevent imminent or  life-threatening deterioration.   Critical care was time spent personally by me on the following activities: development of treatment plan with patient and/or surrogate as well as nursing, discussions with consultants, evaluation of patient's response to treatment, examination of patient, obtaining history from patient or surrogate, ordering and performing treatments and interventions, ordering and review of laboratory studies, ordering and review of radiographic studies, pulse oximetry and re-evaluation of patient's condition.    Ottie Glazier, M.D.  Pulmonary & Critical Care Medicine

## 2022-02-01 NOTE — Progress Notes (Signed)
   02/01/22 1340  Vitals  Temp 97.6 F (36.4 C)  Temp Source Oral  BP (!) 179/115  MAP (mmHg) 131  BP Location Right Arm  BP Method Automatic  Patient Position (if appropriate) Lying  Pulse Rate (!) 111  Pulse Rate Source Monitor  ECG Heart Rate (!) 111  Resp (!) 28  Oxygen Therapy  SpO2 96 %  O2 Device Ventilator  Patient Activity (if Appropriate) In bed  Pulse Oximetry Type Continuous  During Treatment Monitoring  Blood Flow Rate (mL/min) 200 mL/min  HD Safety Checks Performed Yes  Intra-Hemodialysis Comments Tx completed  Post Treatment  Dialyzer Clearance Lightly streaked  Duration of HD Treatment -hour(s) 3 hour(s)  Hemodialysis Intake (mL) 0 mL  Liters Processed 54  Fluid Removed (mL) 2500 mL  Tolerated HD Treatment Yes  Post-Hemodialysis Comments tx completed. no complications  Hemodialysis Catheter Right Subclavian  No placement date or time found.   Placed prior to admission: Yes  Orientation: Right  Access Location: Subclavian  Site Condition No complications  Blue Lumen Status Heparin locked  Red Lumen Status Heparin locked  Purple Lumen Status N/A  Catheter fill solution Heparin 1000 units/ml  Catheter fill volume (Arterial) 1.6 cc  Catheter fill volume (Venous) 1.7  Dressing Type Transparent  Dressing Status Antimicrobial disc in place  Interventions Dressing changed;New dressing  Drainage Description None  Dressing Change Due 02/08/22  Post treatment catheter status Capped and Clamped

## 2022-02-01 NOTE — Progress Notes (Addendum)
[id number] Patient demanding that his ETT be removed. Does not remember the events of yesterday. Writes to nurse constantly- asking about yesterday's events. Why is he here etc. Explained to patient numerous times what happened and why he is here. Sedation deceased to half of original dose. 0800 Patient still asking same questions.Request his cell phone. Unable to find patients cell phone. Re-explained his admission to ICU. 0830 Threw pen at nurse. Keeps asking who made the mistake in the OR-That is why he is here. Some screwed up. Explained why he was here. Dr. Lanney Gins in to see patient. Patient made more effort to take deep breaths. Decision to extubate per Dr. Lanney Gins.  0845 IV medications( Fentanyl and Propofol) stopped. Unable to flush IV. After further investigation patients only IV was bent and un-useable. Charge nurse attempted IV placement x 2 but unsuccessful.IV per IV team. 1200 Dialysis in process- patient still demanding to write to everyone. 1400 Dialysis over. 1413 Extubated to 3 L [place] 1435 B/P still elevated. Treated with prn Labetalol.Patient refused to speak to nurse. Patient showed nurse on his tablet " Why do you have to be so rude and tell me that I am going to die? I am going to complain about you!" Explained to patient that I was telling him the truth. We could not extubate until he was ready and the MD gave the order. Patient ignored the nurse. [place] bedside swallow evaluation. Diet ordered. 1746 Medicated for pain and blood pressure. 1800 Enjoying dinner.

## 2022-02-01 NOTE — Progress Notes (Signed)
Patient removed tegaderm dressing on perm-cath, concern that the patient may have dislodged his perm-cath.  - STAT CXR to confirm placement showed the perm-cath in a satisfactory position - nursing attempted to re-dress the perm-cath to keep the site clean and prevent infection, patient refused. Patient educated on the importance of preventing an infection from his dialysis catheter. Refusal for care documented by nursing.    Domingo Pulse [place], AGACNP-BC Acute Care Nurse Practitioner [facility]   [phone] / [phone] Please see Amion for pager details.

## 2022-02-01 NOTE — Anesthesia Postprocedure Evaluation (Signed)
Anesthesia Post Note  Patient: Travis Long  Procedure(s) Performed: LAPAROSCOPIC REMOVAL CONTINUOUS AMBULATORY PERITONEAL DIALYSIS  (CAPD) CATHETER  Patient location during evaluation: ICU Anesthesia Type: General Level of consciousness: awake and patient remains intubated per anesthesia plan Pain management: pain level controlled Vital Signs Assessment: post-procedure vital signs reviewed and stable Respiratory status: patient remains intubated per anesthesia plan Cardiovascular status: stable Postop Assessment: no apparent nausea or vomiting Anesthetic complications: no   No notable events documented.   Last Vitals:  Vitals:   02/01/22 0749 02/01/22 0800  BP:  (!) 144/109  Pulse:  77  Resp:  (!) 27  Temp:    SpO2: 100% 100%    Last Pain:  Vitals:   02/01/22 0400  TempSrc: Axillary  PainSc: 0-No pain                 Estill Batten

## 2022-02-01 NOTE — Progress Notes (Signed)
Pt. Extubated to 3lnc,hr 113,rr 20,sat 100. No resp. Distress noted at this time.

## 2022-02-02 LAB — CBC
HCT: 26.1 % — ABNORMAL LOW (ref 39.0–52.0)
Hemoglobin: 8.3 g/dL — ABNORMAL LOW (ref 13.0–17.0)
MCH: 30 pg (ref 26.0–34.0)
MCHC: 31.8 g/dL (ref 30.0–36.0)
MCV: 94.2 fL (ref 80.0–100.0)
Platelets: 221 10*3/uL (ref 150–400)
RBC: 2.77 MIL/uL — ABNORMAL LOW (ref 4.22–5.81)
RDW: 13.5 % (ref 11.5–15.5)
WBC: 7.2 10*3/uL (ref 4.0–10.5)
nRBC: 0 % (ref 0.0–0.2)

## 2022-02-02 LAB — MAGNESIUM: Magnesium: 2.2 mg/dL (ref 1.7–2.4)

## 2022-02-02 LAB — GLUCOSE, CAPILLARY: Glucose-Capillary: 76 mg/dL (ref 70–99)

## 2022-02-02 LAB — RENAL FUNCTION PANEL
Albumin: 3.1 g/dL — ABNORMAL LOW (ref 3.5–5.0)
Anion gap: 12 (ref 5–15)
BUN: 43 mg/dL — ABNORMAL HIGH (ref 6–20)
CO2: 29 mmol/L (ref 22–32)
Calcium: 8.4 mg/dL — ABNORMAL LOW (ref 8.9–10.3)
Chloride: 94 mmol/L — ABNORMAL LOW (ref 98–111)
Creatinine, Ser: 10.51 mg/dL — ABNORMAL HIGH (ref 0.61–1.24)
GFR, Estimated: 6 mL/min — ABNORMAL LOW (ref >60)
Glucose, Bld: 125 mg/dL — ABNORMAL HIGH (ref 70–99)
Phosphorus: 7 mg/dL — ABNORMAL HIGH (ref 2.5–4.6)
Potassium: 3.8 mmol/L (ref 3.5–5.1)
Sodium: 135 mmol/L (ref 135–145)

## 2022-02-02 MED ORDER — HYDROXYZINE HCL 10 MG/5ML PO SYRP
10.0000 mg | ORAL_SOLUTION | Freq: Three times a day (TID) | ORAL | Status: DC | PRN
  Administered 2022-02-02: 10 mg via ORAL
  Filled 2022-02-02 (×4): qty 5

## 2022-02-02 MED ORDER — EPOETIN ALFA 10000 UNIT/ML IJ SOLN
INTRAMUSCULAR | Status: AC
  Administered 2022-02-02: 10000 [IU] via INTRAVENOUS
  Filled 2022-02-02: qty 1

## 2022-02-02 MED ORDER — NAPHAZOLINE-GLYCERIN 0.012-0.25 % OP SOLN
1.0000 [drp] | Freq: Four times a day (QID) | OPHTHALMIC | Status: DC | PRN
  Administered 2022-02-02: 2 [drp] via OPHTHALMIC
  Filled 2022-02-02: qty 15

## 2022-02-02 MED ORDER — PHENOL 1.4 % MT LIQD
1.0000 | OROMUCOSAL | Status: DC | PRN
  Administered 2022-02-02: 1 via OROMUCOSAL
  Filled 2022-02-02: qty 177

## 2022-02-02 NOTE — Discharge Summary (Signed)
Physician Discharge Summary  Patient ID: Travis Long MRN: [id number] DOB/AGE: 30-Jul-1985 36 y.o.  Admit date: 01/31/2022 Discharge date: 02/02/2022   Discharge Diagnoses:  End Stage Renal Disease on HD  Bilateral Pleural Effusions with Pulmonary Edema  Anemia of Chronic Kidney Disease  Secondary Hyperparathyroidism  Hypertension with Chronic Kidney Disease                                                                     DISCHARGE PLAN BY DIAGNOSIS    End Stage Renal Disease on HD  Acute respiratory failure secondary to bilateral pleural effusions with pulmonary edema  Plan: Continue hemodialysis regimen with [facility] on MWF Continue calcium acetate  Follow renal diet with fluid restrictions of 1200 ml per day  Continue as needed O2 _0  for dyspnea and/or hypoxia   Hypertension with Chronic Kidney Disease  Plan: Continue outpatient amlodipine, carvedilol, hydralazine, and minoxidil   Anemia of Chronic Kidney Disease  Plan: - Hemoglobin stable at discharge hgb 8.3/hct 26.1   Excision of Peritoneal Dialysis Catheter  - Follow-up with General Surgery in 2 weeks      DISCHARGE SUMMARY   Travis Long is a 36 y.o. y/o male with a PMH of ESRD on HD, chronic hypoxic respiratory failure (requires 1-3L supplemental O2) and recurrent pleural effusions s/p Left Thoracotomy with decortication, who underwent elective PD catheter removal on 01/31/22.  In PACU developed post-op Respiratory Failure requiring reintubation due to pulmonary edema & bilateral pleural effusions.  Pt successfully extubated 02/01/22.   Per chart review, the patient has previously been on hemodialysis, however there was issues with compliance and behavior, which ultimately resulted in him being banned from receiving HD near his home in the [place], [place] area.  Subsequently was receiving HD at [facility] here in [place], [place].  Therefore on 07/27/2021 he had peritoneal dialysis catheter placed  laparoscopically and has made attempts at peritoneal dialysis.  Peritoneal dialysis course has been complicated by worsening of pleural effusions due to peritoneal dialysis fluid, and has since transitioned back to hemodialysis. Thus decision was made to removal PD catheter.   MICRO DATA  MRSA PCR 11/8>>negative   ANTIBIOTICS Cefazolin 01/31/22 x1 dose   CONSULTS Intensivist  Nephrology   TUBES / LINES Right subclavian hemodialysis catheter   Discharge Exam: General: Well developed/well nourished male, resting in bed, in NAD. Neuro: A&O x 3, non-focal.  HEENT: [place]/AT. PERRL, sclerae anicteric. Cardiovascular: RRR, no M/R/G.  Lungs: Respirations even and unlabored.  Mild rhonchi bilateral upper lobes/clear throughout all other lobes, No W/R/R.  Abdomen: BS x 4, soft, NT/ND, abdominal incisions from PD cath removal well approximated  Musculoskeletal: No gross deformities, no edema.  Skin: Abdominal incisions from PD cath removal well approximated   Vitals:   02/02/22 1034 02/02/22 1042 02/02/22 1103 02/02/22 1125  BP: 117/71 131/75 103/71 113/69  Pulse: 71 81 72 69  Resp: (!) 22 (!) 23 19 (!) 23  Temp: 97.6 F (36.4 C)     TempSrc: Oral     SpO2: 100% 96% 100% 100%  Weight:      Height:         Discharge Labs  BMET Recent Labs  Lab 01/31/22 0637 02/01/22 0457 02/02/22 0446  NA 137  136 135  K 3.5 3.3* 3.8  CL 91* 96* 94*  CO2  --  28 29  GLUCOSE 117* 73 125*  BUN 40* 27* 43*  CREATININE 13.20* 8.07* 10.51*  CALCIUM  --  8.8* 8.4*  MG  --  1.8 2.2  PHOS  --  4.0 7.0*    CBC Recent Labs  Lab 01/31/22 1120 02/01/22 0457 02/02/22 0446  HGB 7.1* 7.3* 8.3*  HCT 22.2* 23.1* 26.1*  WBC 10.2 6.3 7.2  PLT 207 197 221    Anti-Coagulation No results for input(s): "INR" in the last 168 hours.  Discharge Instructions     Call MD for:  difficulty breathing, headache or visual disturbances   Complete by: As directed    Call MD for:  persistant nausea and  vomiting   Complete by: As directed    Call MD for:  redness, tenderness, or signs of infection (pain, swelling, redness, odor or green/yellow discharge around incision site)   Complete by: As directed    Call MD for:  severe uncontrolled pain   Complete by: As directed    Call MD for:  temperature >100.4   Complete by: As directed    Diet - low sodium heart healthy   Complete by: As directed    Discharge instructions   Complete by: As directed    1.  Patient may shower, but do not scrub wounds heavily and dab dry only. 2.  Do not submerge wounds in pool/tub until fully healed. 3.  Do not apply ointments or hydrogen peroxide to the wounds. 4.  May apply ice packs to the wounds for comfort. 5.  Please change BandAid once daily until the wound is fully healed/closed.   Driving Restrictions   Complete by: As directed    Do not drive while taking narcotics for pain control.  Prior to driving, make sure you are able to rotate right and left to look at blindspots without significant pain or discomfort.   Increase activity slowly   Complete by: As directed    Lifting restrictions   Complete by: As directed    No heavy lifting or pushing of more than 10-15 lbs for 4 weeks.         Follow-up Information     Tylene Fantasia, PA-C Follow up in 2 week(s).   Specialty: Physician Assistant Why: Appointment scheduled for Wednesday Feb 14, 2022 at 02:30 pm with Dr. Wynona Canes information: [place] [id number] [phone]                  Allergies as of 02/02/2022   No Known Allergies      Medication List     TAKE these medications    acetaminophen 500 MG tablet Commonly known as: TYLENOL Take 2 tablets (1,000 mg total) by mouth every 6 (six) hours as needed for mild pain.   amLODipine 10 MG tablet Commonly known as: NORVASC Take 1 tablet (10 mg total) by mouth daily. What changed: when to take this   calcium acetate 667 MG  capsule Commonly known as: PHOSLO Take 2 capsules (1,334 mg total) by mouth 3 (three) times daily with meals.   carvedilol 25 MG tablet Commonly known as: COREG Take 1 tablet (25 mg total) by mouth 2 (two) times daily with a meal.   dicyclomine 10 MG capsule Commonly known as: BENTYL Take 1 capsule (10 mg total) by mouth every 6 (six) hours as needed (nausea, emesis).  hydrALAZINE 100 MG tablet Commonly known as: APRESOLINE Take 1 tablet (100 mg total) by mouth every 8 (eight) hours.   minoxidil 2.5 MG tablet Commonly known as: LONITEN Take 2 tablets (5 mg total) by mouth daily.   oxyCODONE 5 MG immediate release tablet Commonly known as: Oxy IR/ROXICODONE Take 1 tablet (5 mg total) by mouth every 6 (six) hours as needed for severe pain.   OXYGEN Inhale 3 L into the lungs continuous.         Disposition: Home   Discharged Condition: Travis Long has met maximum benefit of inpatient care and is medically stable and cleared for discharge.  Patient is pending follow up as above   Donell Beers, [place] Pager [phone] (please enter 7 digits) PCCM Consult Pager [phone] (please enter 7 digits)

## 2022-02-02 NOTE — Progress Notes (Signed)
[facility]  ROUNDING NOTE   Subjective:   Travis Long is a 36 year old male with past medical conditions including hypertension, GERD, anemia, recurrent pleural effusions status post left thoracotomy with decortication, CHF, chronic hypoxic respiratory requiring supplemental oxygen, and end-stage renal disease on hemodialysis.  Patient presents for scheduled procedure, PD catheter removal.  After successful procedure, patient extubated and reintubated due to respiratory failure.  Patient has been admitted for Post-operative state [Z98.890]  Patient is known to our practice and receives outpatient dialysis treatments at [facility] on a MWF schedule, supervised by Dr. Holley Raring.    Patient now extubated. Feeling better. Due for hemodialysis treatment today.   Objective:  Vital signs in last 24 hours:  Temp:  [97.6 F (36.4 C)-98.7 F (37.1 C)] 97.6 F (36.4 C) (11/10 1034) Pulse Rate:  [50-126] 88 (11/10 0900) Resp:  [18-38] 22 (11/10 1034) BP: (113-191)/(51-126) 117/71 (11/10 1034) SpO2:  [88 %-100 %] 100 % (11/10 1034) FiO2 (%):  [28 %] 28 % (11/09 1400) Weight:  [73.8 kg] 73.8 kg (11/09 1347)  Weight change: -1 kg Filed Weights   01/31/22 1017 02/01/22 1000 02/01/22 1347  Weight: 77.3 kg 76.3 kg 73.8 kg    Intake/Output: I/O last 3 completed shifts: In: 1489.2 [P.O.:1120; I.V.:319.2; NG/GT:50] Out: 2500 [Other:2500]   Intake/Output this shift:  No intake/output data recorded.  Physical Exam: General: NAD  Head: Normocephalic, atraumatic.   Eyes: Anicteric  Lungs:  Intubated on vent  Heart: Regular rate and rhythm  Abdomen:  Soft, nontender  Extremities: No peripheral edema.  Neurologic: Alert, moving all four extremities  Skin: No lesions  Access: Right PermCath    Basic Metabolic Panel: Recent Labs  Lab 01/31/22 0637 02/01/22 0457 02/02/22 0446  NA 137 136 135  K 3.5 3.3* 3.8  CL 91* 96* 94*  CO2  --  28 29  GLUCOSE 117* 73 125*   BUN 40* 27* 43*  CREATININE 13.20* 8.07* 10.51*  CALCIUM  --  8.8* 8.4*  MG  --  1.8 2.2  PHOS  --  4.0 7.0*     Liver Function Tests: Recent Labs  Lab 02/01/22 0457 02/02/22 0446  ALBUMIN 2.8* 3.1*    No results for input(s): "LIPASE", "AMYLASE" in the last 168 hours. No results for input(s): "AMMONIA" in the last 168 hours.  CBC: Recent Labs  Lab 01/31/22 0637 01/31/22 1120 02/01/22 0457 02/02/22 0446  WBC  --  10.2 6.3 7.2  HGB 10.2* 7.1* 7.3* 8.3*  HCT 30.0* 22.2* 23.1* 26.1*  MCV  --  95.3 95.9 94.2  PLT  --  207 197 221     Cardiac Enzymes: No results for input(s): "CKTOTAL", "CKMB", "CKMBINDEX", "TROPONINI" in the last 168 hours.  BNP: Invalid input(s): "POCBNP"  CBG: Recent Labs  Lab 01/31/22 2329 02/01/22 0351 02/01/22 0757 02/01/22 1229 02/01/22 1641  GLUCAP 78 71 70 89 76     Microbiology: Results for orders placed or performed during the hospital encounter of 01/31/22  MRSA Next Gen by PCR, Nasal     Status: None   Collection Time: 01/31/22 10:19 AM   Specimen: Nasal Mucosa; Nasal Swab  Result Value Ref Range Status   MRSA by PCR Next Gen NOT DETECTED NOT DETECTED Final    Comment: (NOTE) The GeneXpert MRSA Assay (FDA approved for NASAL specimens only), is one component of a comprehensive MRSA colonization surveillance program. It is not intended to diagnose MRSA infection nor to guide or monitor treatment for  MRSA infections. Test performance is not FDA approved in patients less than 55 years old. Performed at [facility], [address] [place], [place] [address]     Coagulation Studies: No results for input(s): "LABPROT", "INR" in the last 72 hours.  Urinalysis: No results for input(s): "COLORURINE", "LABSPEC", "PHURINE", "GLUCOSEU", "HGBUR", "BILIRUBINUR", "KETONESUR", "PROTEINUR", "UROBILINOGEN", "NITRITE", "LEUKOCYTESUR" in the last 72 hours.  Invalid input(s): "APPERANCEUR"    Imaging: DG Chest Port 1  View  Result Date: 02/01/2022 CLINICAL DATA:  Central line complication. Vas-Cath possibly malposition. EXAM: PORTABLE CHEST 1 VIEW COMPARISON:  Radiograph earlier today. FINDINGS: Tip of the right-sided dialysis catheter overlies the atrial caval junction, unchanged from prior exam. Interval extubation and removal of enteric tubes. Stable moderate bilateral pleural effusions. Stable heart size. Bilateral perihilar opacities typical of edema. No pneumothorax. IMPRESSION: 1. Right-sided dialysis catheter tip projects over the atrial caval junction, unchanged from prior exam. 2. Unchanged moderate bilateral pleural effusions and pulmonary edema. Electronically Signed   By: Keith Rake M.D.   On: 02/01/2022 22:14   DG Chest Port 1 View  Result Date: 02/01/2022 CLINICAL DATA:  Respiratory failure, hypoxia EXAM: PORTABLE CHEST 1 VIEW COMPARISON:  Previous studies including the examination of 01/31/2022 FINDINGS: Transverse diameter of heart is increased. Tip of endotracheal tube is 6.7 cm above the carina. Enteric tube is noted traversing the esophagus. Tip of dialysis catheter is seen in superior vena cava close to the right atrium. Moderate bilateral pleural effusions are seen with no significant interval change. Linear densities are seen in left parahilar region and both lower lung fields suggesting atelectasis/pneumonia. There is no pneumothorax. IMPRESSION: Moderate bilateral pleural effusions with no significant interval change. Part of the increased density in both lower lung fields may be due to underlying atelectasis/pneumonia. There is new linear density in left parahilar region which may be due to loculated effusion or atelectasis. Electronically Signed   By: Elmer Picker M.D.   On: 02/01/2022 08:20   DG Abd 1 View  Result Date: 01/31/2022 CLINICAL DATA:  OG tube placement EXAM: ABDOMEN - 1 VIEW COMPARISON:  None Available. FINDINGS: Nonobstructive bowel gas pattern. Enteric tube  terminates in the proximal gastric body. Small bilateral pleural effusions. IMPRESSION: Enteric tube terminates in the proximal gastric body. Electronically Signed   By: Julian Hy M.D.   On: 01/31/2022 19:15   DG Chest Port 1 View  Result Date: 01/31/2022 CLINICAL DATA:  Intubated EXAM: PORTABLE CHEST 1 VIEW COMPARISON:  04/26/2021 FINDINGS: Endotracheal tube tip 6 cm above the carina. Right-sided central line tip at the SVC RA junction. Bilateral pleural effusions. Mild pulmonary edema. Atelectasis of the lower lungs. IMPRESSION: Endotracheal tube tip 6 cm above the carina. Bilateral pleural effusions. Mild pulmonary edema. Lower lung atelectasis. Electronically Signed   By: Nelson Chimes M.D.   On: 01/31/2022 11:08     Medications:    anticoagulant sodium citrate      amLODipine  10 mg Oral [id number]   calcium acetate  1,334 mg Oral TID WC   carvedilol  25 mg Oral BID WC   Chlorhexidine Gluconate Cloth  6 each Topical Daily   epoetin (EPOGEN/PROCRIT) injection  10,000 Units Intravenous Q M,W,F-HD   hydrALAZINE  100 mg Oral Q8H   alteplase, anticoagulant sodium citrate, dicyclomine, heparin, hydrOXYzine, labetalol, lidocaine (PF), lidocaine-prilocaine, morphine injection, naphazoline-glycerin, oxyCODONE-acetaminophen, pentafluoroprop-tetrafluoroeth, phenol  Assessment/ Plan:  Mr. Cruise Baumgardner is a 36 y.o.  male with past medical conditions including hypertension, GERD, anemia, recurrent  pleural effusions status post left thoracotomy with decortication, CHF, chronic hypoxic respiratory requiring supplemental oxygen, and end-stage renal disease on hemodialysis.  Patient presents for scheduled procedure, PD catheter removal.  After successful procedure, patient extubated and reintubated due to respiratory failure.  Patient has been admitted for Post-operative state [Z98.890]   End-stage renal disease with fluid overload on hemodialysis.  Will maintain outpatient schedule if possible.   Chest x-ray shows bilateral pleural effusions with pulmonary edema.  Failed extubation postoperatively, patient reintubated.  We are planning for hemodialysis treatment today.  Orders have been prepared.  2. Anemia of chronic kidney disease Lab Results  Component Value Date   HGB 8.3 (L) 02/02/2022    Hemoglobin improved to 8.3 today.  Continue Epogen with dialysis treatments.  3.Secondary Hyperparathyroidism: with outpatient labs: PTH 1658, phosphorus 6.9, calcium 8.6 on 01/01/22.    Lab Results  Component Value Date   CALCIUM 8.4 (L) 02/02/2022   CAION 1.03 (L) 01/31/2022   PHOS 7.0 (H) 02/02/2022  Phosphorus still a bit high at 7.0.  Hopefully this will come down with ongoing dialysis treatments.  Continue current dosage of calcium acetate.  4.  Hypertension with chronic kidney disease.  Home regimen includes amlodipine, carvedilol, hydralazine, and minoxidil.  Patient currently restarted on amlodipine, carvedilol, and hydralazine.  Blood pressure currently 117/71.   LOS: 2 Malva Diesing 11/10/202310:39 AM

## 2022-02-02 NOTE — Progress Notes (Signed)
Post hd treatment

## 2022-02-02 NOTE — Progress Notes (Signed)
Pt states "3.5 hours are too long for dialysis". He will end HD at 2 hours. Sherlyn Hay, NP notified.

## 2022-02-02 NOTE — Progress Notes (Signed)
Pre hd rn assessment 

## 2022-02-02 NOTE — Progress Notes (Signed)
Pt completed 1.75 hours of HD treatment. Nausea, emesis and dizzy. Sherlyn Hay, NP present and aware. Report to primary RN. Start: 1042 End: 1325 635ml fluid removed 43.3L BVP Epogen 10000u w/ HD 73kg post hd bed weight

## 2022-02-02 NOTE — Progress Notes (Signed)
UF goal decreased to 1.5L per Sherlyn Hay, NP. Pt alert, itching, does not want medication, vss, safety maintained, continue to monitor.

## 2022-02-02 NOTE — Progress Notes (Signed)
Pt refused HD treatment today per ICU RN.

## 2022-02-02 NOTE — Progress Notes (Signed)
Tx paused d/t RO filter failure. Pt alert, no c/o, vss.

## 2022-02-02 NOTE — Progress Notes (Signed)
Asked to evaluate the patient's PermCath placement.  X-ray done last night shows PermCath is in expected location.  Physical examination shows that the cuff is not exposed and is in the proper location.  Currently no intervention necessary at this time.  Please inform us if there are issues we will be glad to reevaluate.  Kris Hartmann, NP  [facility]

## 2022-02-02 NOTE — Progress Notes (Addendum)
0800 Patient awake and alert. Pleasant. Breakfast ordered 0900 Patient very argumentitive. Ask" Why are people saying I refused dialysis. I didn't say that I do what I am supposed to do."  0930 Ate breakfast without issues. 2217 Complains of itching. Given prn. 46 Down to dialysis suite via bed. 1430 Back from dialysis. 1500 Discharge teaching done.Questions answered. Follow up appointments  and restrictions high lighted in yellow. 1630 Discharge home with mother.

## 2022-02-02 NOTE — Progress Notes (Addendum)
Pt uf off per patient request d/t nausea. Pt alert, vss, emesis, safety maintained. Sherlyn Hay, NP present and aware.

## 2022-02-02 NOTE — Progress Notes (Signed)
NAME:  Travis Long, MRN:  [id number], DOB:  24-Feb-1986, LOS: 2 ADMISSION DATE:  01/31/2022, CONSULTATION DATE:  01/31/2022 REFERRING MD:  Dr. Hampton Abbot, CHIEF COMPLAINT:  Post-op Respiratory Failure   Brief Pt Description / Synopsis:  36 y.o. Male with PMH significant for ESRD on HD, chronic hypoxic respiratory failure (requires 1-3L supplemental O2) and recurrent pleural effusions s/p Left Thoracotomy with decortication, who underwent elective PD catheter removal on 01/31/22.  In PACU developed post-op Respiratory Failure requiring reintubation due to pulmonary edema & bilateral pleural effusions.  History of Present Illness:  Travis Long is a 36 year old male with a past medical history significant for end-stage renal disease on HD (M,W,F), chronic hypoxic respiratory requiring 1 to 3 L supplemental O2, recurrent pleural effusions s/p Left Thoracotomy with decortication, HFpEF, hypertension, GERD, anemia of chronic disease, and issues with medical noncompliance, who presented to 01/31/2022 for elective removal of peritoneal dialysis catheter.    Per chart review, the patient has previously been on hemodialysis, however there was issues with compliance and behavior, which ultimately resulted in him being banned from receiving HD near his home in the [place], [place] area.  Subsequently was receiving HD at [facility] here in [place], [place].  Therefore on 07/27/2021 he had peritoneal dialysis catheter placed laparoscopically and has made attempts at peritoneal dialysis.  Peritoneal dialysis course has been complicated by worsening of pleural effusions due to peritoneal dialysis fluid, and has since transitioned back to hemodialysis.  Thus decision was made to removal PD catheter.  02/01/22- patient is for HD today with plan for extubation after dialysis.  02/02/22 - patient extubated doing well, discussed dc plan with nephrology, patient has hx of noncompliance and will be dcd home post HD today.     Pertinent  Medical History   Past Medical History:  Diagnosis Date   Acute pulmonary edema (HCC)    Acute respiratory failure with hypoxia (HCC)    Anemia of chronic renal failure    a.) receiving epoetin alfa (EPOGEN) injections   CAP (community acquired pneumonia)    Diverticulosis    Dyspnea    Dyspnea on exertion    Encephalopathy    ESRD on hemodialysis ([place])    a.) banned by all [facility] owned/operated dialysis centers due to "behavioral issues"; [facility] provider petitioned for him to be re-accepted, but was denied. b.) currently on HD with a M-W-F schedule; plans are to transition to PD once catheter placed in 07/2021. c.) has been seen by [facility] renal transplant service; rejected due to medical non-compliance.   GERD (gastroesophageal reflux disease)    Gynecomastia    Heart failure with preserved ejection fraction ([place])    a.) TTE 03/09/2021: EF 60-65%; sev LVH; GLS -8.5%; mild LA dilation; mild TR/PR; G2DD.   Hiatal hernia    Hypertension    Major depression    Marijuana use    Medically noncompliant    a.) does not take antihypertensives as Rx. b.) misses scheduled HD Tx; c.) refuses medically necessary interventions as recommended by his providers (i.e. blood transfusions, medications). d.) involuntarily committed in 02/2021 due attempts to sign out AMA during acute illness; psychiatry consulted. e.) rejected by [facility] for consideration of renal transplant due to past non-compliance   Metabolic encephalopathy    On supplemental oxygen by nasal cannula    a.) 3-6 L/[place] on a PRN basis; mostly used with exertion   Procedural agitation 03/2021   a.) post-procedural agitation following IJ catheter placement in 03/2021; occurred  in setting of significant hypoxia, metabolic encephalopathy, and procedural sedation; states, "I was tied down and it caused me to spazz out".   Recurrent pleural effusion    a.) s/p mult thoracenteses with (+) sig fluid yields: 2130 cc LEFT  (05/12/2020), 1030 cc RIGHT (05/14/2020), 1200 cc LEFT 11/30/2020, 1200 cc RIGHT (12/01/2020), 750 cc LEFT (03/06/2021), 850 cc (03/08/2021), 330 cc LEFT (09/17/2021); b.) 03/08/2021 ->  LEFT 14 Fr pigtail cath to PleurEvac; c.) empyema dev req tube upsizing on 03/09/2021;  d.) s/p LEFT thoracotomy and decortication 09/21/2021   Splenomegaly    Tobacco use      Micro Data:  N/A  Antimicrobials:  N/A  Significant Hospital Events: Including procedures, antibiotic start and stop dates in addition to other pertinent events   11/8: Underwent elective PD catheter removal.  Developed Post-op respiratory failure requiring reintubation.  PCCM consulted for vent management.  Nephrology consulted.    Objective   Blood pressure 131/75, pulse 81, temperature 97.6 F (36.4 C), temperature source Oral, resp. rate (!) 23, height _0  (1.727 m), weight 73.8 kg, SpO2 96 %.    FiO2 (%):  [28 %] 28 %   Intake/Output Summary (Last 24 hours) at 02/02/2022 1048 Last data filed at 02/02/2022 0257 Gross per 24 hour  Intake 1176.42 ml  Output 2500 ml  Net -1323.58 ml    Filed Weights   01/31/22 1017 02/01/22 1000 02/01/22 1347  Weight: 77.3 kg 76.3 kg 73.8 kg    Examination: General: Acute on chronically ill-appearing male, laying in bed, intubated and awake, no acute distress HENT: Atraumatic, normocephalic, neck supple, positive JVD Lungs: Coarse crackles throughout, overbreathing the vent, even, mild tachypnea Cardiovascular: Regular rate and rhythm, S1-S2, no murmurs, rubs, gallops Abdomen: Soft, nontender, nondistended, no guarding or rebound tenderness, bowel sounds positive x4, abdominal incisions from PD cath removal well approximated with Dermabond, clean dry and intact Extremities: Normal bulk and tone, no deformities, 1+ edema Neuro: Awake and alert, difficult to assess orientation due to intubation, moves all extremities to command, no focal deficits, pupils PERRLA GU: Deferred,  unclear if makes any urine with ESRD on HD  Resolved Hospital Problem list     Assessment & Plan:   Post-Op Respiratory Failure, suspect due to pulmonary edema/volume overload & Bilateral Pleural Effusions PMHx: Chronic Hypoxic Respiratory Failure requiring 1-2L of supplemental O2, Recurrent pleural effusions, s/p Left Thoracotomy in June 2023 -Full vent support, implement lung protective strategies -Plateau pressures less than 30 cm H20 -Wean FiO2 & PEEP as tolerated to maintain O2 sats >92% -Follow intermittent Chest X-ray & ABG as needed -Spontaneous Breathing Trials when respiratory parameters met and mental status permits -Implement VAP Bundle -Prn Bronchodilators -Volume removal with HD -Consider Thoracentesis  Acute on Chronic HFpEF PMHx: Hypertension Echocardiogram 03/09/21:  LVEF 60-65%, Grade II DD, RV systolic function normal, small pericardial effusion -Continuous cardiac monitoring -Maintain MAP >65 -Vasopressors as needed to maintain MAP goal ~ not requiring pressors -Check BNP -Volume removal with HD  ESRD on HD (M,W,F) PD catheter removed 01/31/22 by General Surgery -Monitor I&O's / urinary output -Follow BMP -Ensure adequate renal perfusion -Avoid nephrotoxic agents as able -Replace electrolytes as indicated -Consult nephrology, appreciate input ~HD as per nephrology  Anemia of chronic Disease -Monitor for S/Sx of bleeding -Trend CBC -SCD's for VTE Prophylaxis (can resume chemical VTE ppx 24 hrs following procedure) -Transfuse for Hgb <7  Sedation needs in setting of mechanical ventilation PMHx: Encephalopathy with anesthesia, depression, marijuana use -Maintain a  RASS goal of 0 to -1 -Fentanyl and Propofol as needed to maintain RASS goal -Avoid sedating medications as able -Daily wake up assessment     Best Practice (right click and "Reselect all SmartList Selections" daily)   Diet/type: NPO DVT prophylaxis: SCD GI prophylaxis: H2B Lines:  N/A Foley:  N/A Code Status:  full code Last date of multidisciplinary goals of care discussion [01/31/2022]  Labs   CBC: Recent Labs  Lab 01/31/22 0637 01/31/22 1120 02/01/22 0457 02/02/22 0446  WBC  --  10.2 6.3 7.2  HGB 10.2* 7.1* 7.3* 8.3*  HCT 30.0* 22.2* 23.1* 26.1*  MCV  --  95.3 95.9 94.2  PLT  --  207 197 221     Basic Metabolic Panel: Recent Labs  Lab 01/31/22 0637 02/01/22 0457 02/02/22 0446  NA 137 136 135  K 3.5 3.3* 3.8  CL 91* 96* 94*  CO2  --  28 29  GLUCOSE 117* 73 125*  BUN 40* 27* 43*  CREATININE 13.20* 8.07* 10.51*  CALCIUM  --  8.8* 8.4*  MG  --  1.8 2.2  PHOS  --  4.0 7.0*    GFR: Estimated Creatinine Clearance: 9.6 mL/min (A) (by C-G formula based on SCr of 10.51 mg/dL (H)). Recent Labs  Lab 01/31/22 1120 02/01/22 0457 02/02/22 0446  WBC 10.2 6.3 7.2     Liver Function Tests: Recent Labs  Lab 02/01/22 0457 02/02/22 0446  ALBUMIN 2.8* 3.1*    No results for input(s): "LIPASE", "AMYLASE" in the last 168 hours. No results for input(s): "AMMONIA" in the last 168 hours.  ABG    Component Value Date/Time   PHART 7.51 (H) 01/31/2022 1158   PCO2ART 43 01/31/2022 1158   PO2ART 129 (H) 01/31/2022 1158   HCO3 34.3 (H) 01/31/2022 1158   TCO2 33 (H) 01/31/2022 0637   ACIDBASEDEF 4.7 (H) 09/16/2020 0609   O2SAT 100 01/31/2022 1158     Coagulation Profile: No results for input(s): "INR", "PROTIME" in the last 168 hours.  Cardiac Enzymes: No results for input(s): "CKTOTAL", "CKMB", "CKMBINDEX", "TROPONINI" in the last 168 hours.  HbA1C: No results found for: "HGBA1C"  CBG: Recent Labs  Lab 01/31/22 2329 02/01/22 0351 02/01/22 0757 02/01/22 1229 02/01/22 1641  GLUCAP 78 71 70 89 76     Review of Systems:   Unable to assess due to intubation   Past Medical History:  He,  has a past medical history of Acute pulmonary edema ([place]), Acute respiratory failure with hypoxia ([place]), Anemia of chronic renal failure, CAP  (community acquired pneumonia), Diverticulosis, Dyspnea, Dyspnea on exertion, Encephalopathy, ESRD on hemodialysis ([place]), GERD (gastroesophageal reflux disease), Gynecomastia, Heart failure with preserved ejection fraction ([place]), Hiatal hernia, Hypertension, Major depression, Marijuana use, Medically noncompliant, Metabolic encephalopathy, On supplemental oxygen by nasal cannula, Procedural agitation (03/2021), Recurrent pleural effusion, Splenomegaly, and Tobacco use.   Surgical History:   Past Surgical History:  Procedure Laterality Date   A/V FISTULAGRAM Left 03/09/2021   Procedure: A/V Fistulagram;  Surgeon: Algernon Huxley, MD;  Location: [facility];  Service: Cardiovascular;  Laterality: Left;   A/V FISTULAGRAM Left 07/06/2021   Procedure: A/V Fistulagram;  Surgeon: Algernon Huxley, MD;  Location: [facility];  Service: Cardiovascular;  Laterality: Left;   AV FISTULA PLACEMENT Left    CAPD INSERTION N/A 07/27/2021   Procedure: LAPAROSCOPIC INSERTION CONTINUOUS AMBULATORY PERITONEAL DIALYSIS  (CAPD) CATHETER;  Surgeon: Olean Ree, MD;  Location: [facility];  Service: General;  Laterality:  N/A;   CAPD REMOVAL N/A 01/31/2022   Procedure: LAPAROSCOPIC REMOVAL CONTINUOUS AMBULATORY PERITONEAL DIALYSIS  (CAPD) CATHETER;  Surgeon: Olean Ree, MD;  Location: [facility];  Service: General;  Laterality: N/A;   DIALYSIS/PERMA CATHETER INSERTION N/A 04/18/2021   Procedure: DIALYSIS/PERMA CATHETER INSERTION;  Surgeon: Katha Cabal, MD;  Location: [facility];  Service: Cardiovascular;  Laterality: N/A;   IR CATHETER TUBE CHANGE  03/09/2021   THORACOTOMY / DECORTICATION PARIETAL PLEURA Left 09/21/2021   Procedure: THORACOTOMY / [place]; CRYO ABLATION; Location: [facility]; Surgeon: Adonis Housekeeper, MD     Social History:   reports that he quit smoking about 2 years ago. His smoking use included cigarettes. He has been exposed to tobacco smoke. He has  never used smokeless tobacco. He reports that he does not currently use alcohol. He reports that he does not use drugs.   Family History:  His family history includes Diabetes Mellitus II in his mother; Hypertension in his mother.   Allergies No Known Allergies   Home Medications  Prior to Admission medications   Medication Sig Start Date End Date Taking? Authorizing Provider  acetaminophen (TYLENOL) 500 MG tablet Take 2 tablets (1,000 mg total) by mouth every 6 (six) hours as needed for mild pain. 07/27/21  Yes Piscoya, Jacqulyn Bath, MD  acetaminophen (TYLENOL) 500 MG tablet Take 2 tablets (1,000 mg total) by mouth every 6 (six) hours as needed for mild pain. 01/31/22  Yes Piscoya, Jacqulyn Bath, MD  amLODipine (NORVASC) 10 MG tablet Take 1 tablet (10 mg total) by mouth daily. Patient taking differently: Take 10 mg by mouth every morning. 12/02/20  Yes Sharen Hones, MD  calcium acetate (PHOSLO) 667 MG capsule Take 2 capsules (1,334 mg total) by mouth 3 (three) times daily with meals. 03/10/21  Yes Nicole Kindred A, DO  carvedilol (COREG) 25 MG tablet Take 1 tablet (25 mg total) by mouth 2 (two) times daily with a meal. 03/15/21  Yes Gonfa, Charlesetta Ivory, MD  dicyclomine (BENTYL) 10 MG capsule Take 1 capsule (10 mg total) by mouth every 6 (six) hours as needed (nausea, emesis). 03/15/21  Yes Mercy Riding, MD  hydrALAZINE (APRESOLINE) 100 MG tablet Take 1 tablet (100 mg total) by mouth every 8 (eight) hours. 03/15/21  Yes Mercy Riding, MD  oxyCODONE (OXY IR/ROXICODONE) 5 MG immediate release tablet Take 1 tablet (5 mg total) by mouth every 6 (six) hours as needed for severe pain. 01/31/22  Yes Piscoya, Jacqulyn Bath, MD  OXYGEN Inhale 3 L into the lungs continuous.   Yes [provider]  minoxidil (LONITEN) 2.5 MG tablet Take 2 tablets (5 mg total) by mouth daily. Patient not taking: Reported on 01/31/2022 12/03/20   Sharen Hones, MD     Critical care provider statement:   Total critical care time: 33 minutes    Performed by: Lanney Gins MD   Critical care time was exclusive of separately billable procedures and treating other patients.   Critical care was necessary to treat or prevent imminent or life-threatening deterioration.   Critical care was time spent personally by me on the following activities: development of treatment plan with patient and/or surrogate as well as nursing, discussions with consultants, evaluation of patient's response to treatment, examination of patient, obtaining history from patient or surrogate, ordering and performing treatments and interventions, ordering and review of laboratory studies, ordering and review of radiographic studies, pulse oximetry and re-evaluation of patient's condition.    Ottie Glazier, M.D.  Pulmonary &  Critical Care Medicine

## 2022-02-02 NOTE — Progress Notes (Signed)
HD start at 1042. Pt emesis approximately 338ml. Alert, vss and no c/o. Sherlyn Hay, NP notified. Safety maintained, continue to monitor.

## 2022-02-14 ENCOUNTER — Encounter: Payer: Medicare Other | Admitting: Surgery

## 2022-06-25 DEATH — deceased

## 2023-04-01 IMAGING — CR DG CHEST 2V
2 series · 2 of 2 positions shown · non-contrast
Comparison: the previous day's study

CLINICAL DATA: Volume overload; shortness of breath; htn

EXAM:
CHEST - 2 VIEW

[chest lat]
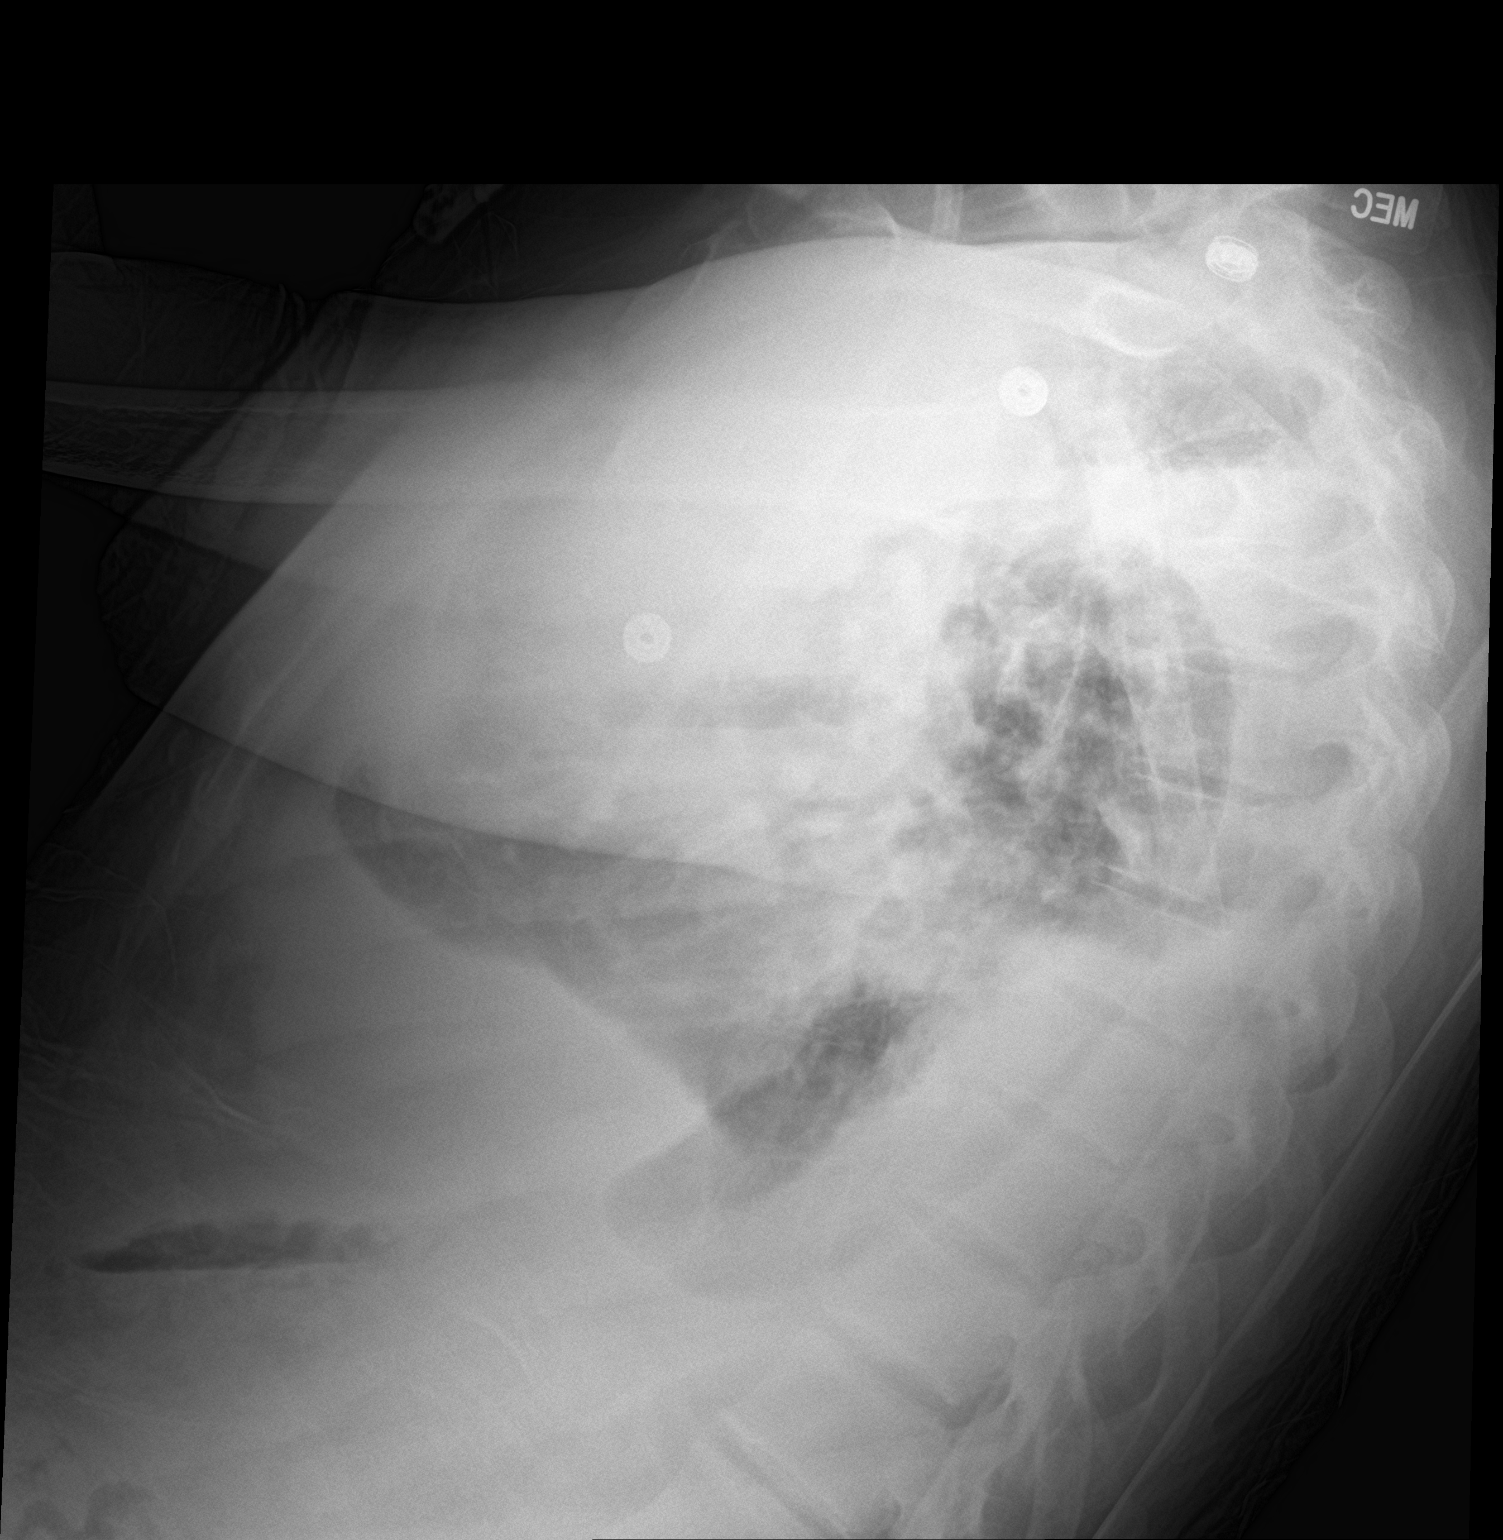

[chest ap]
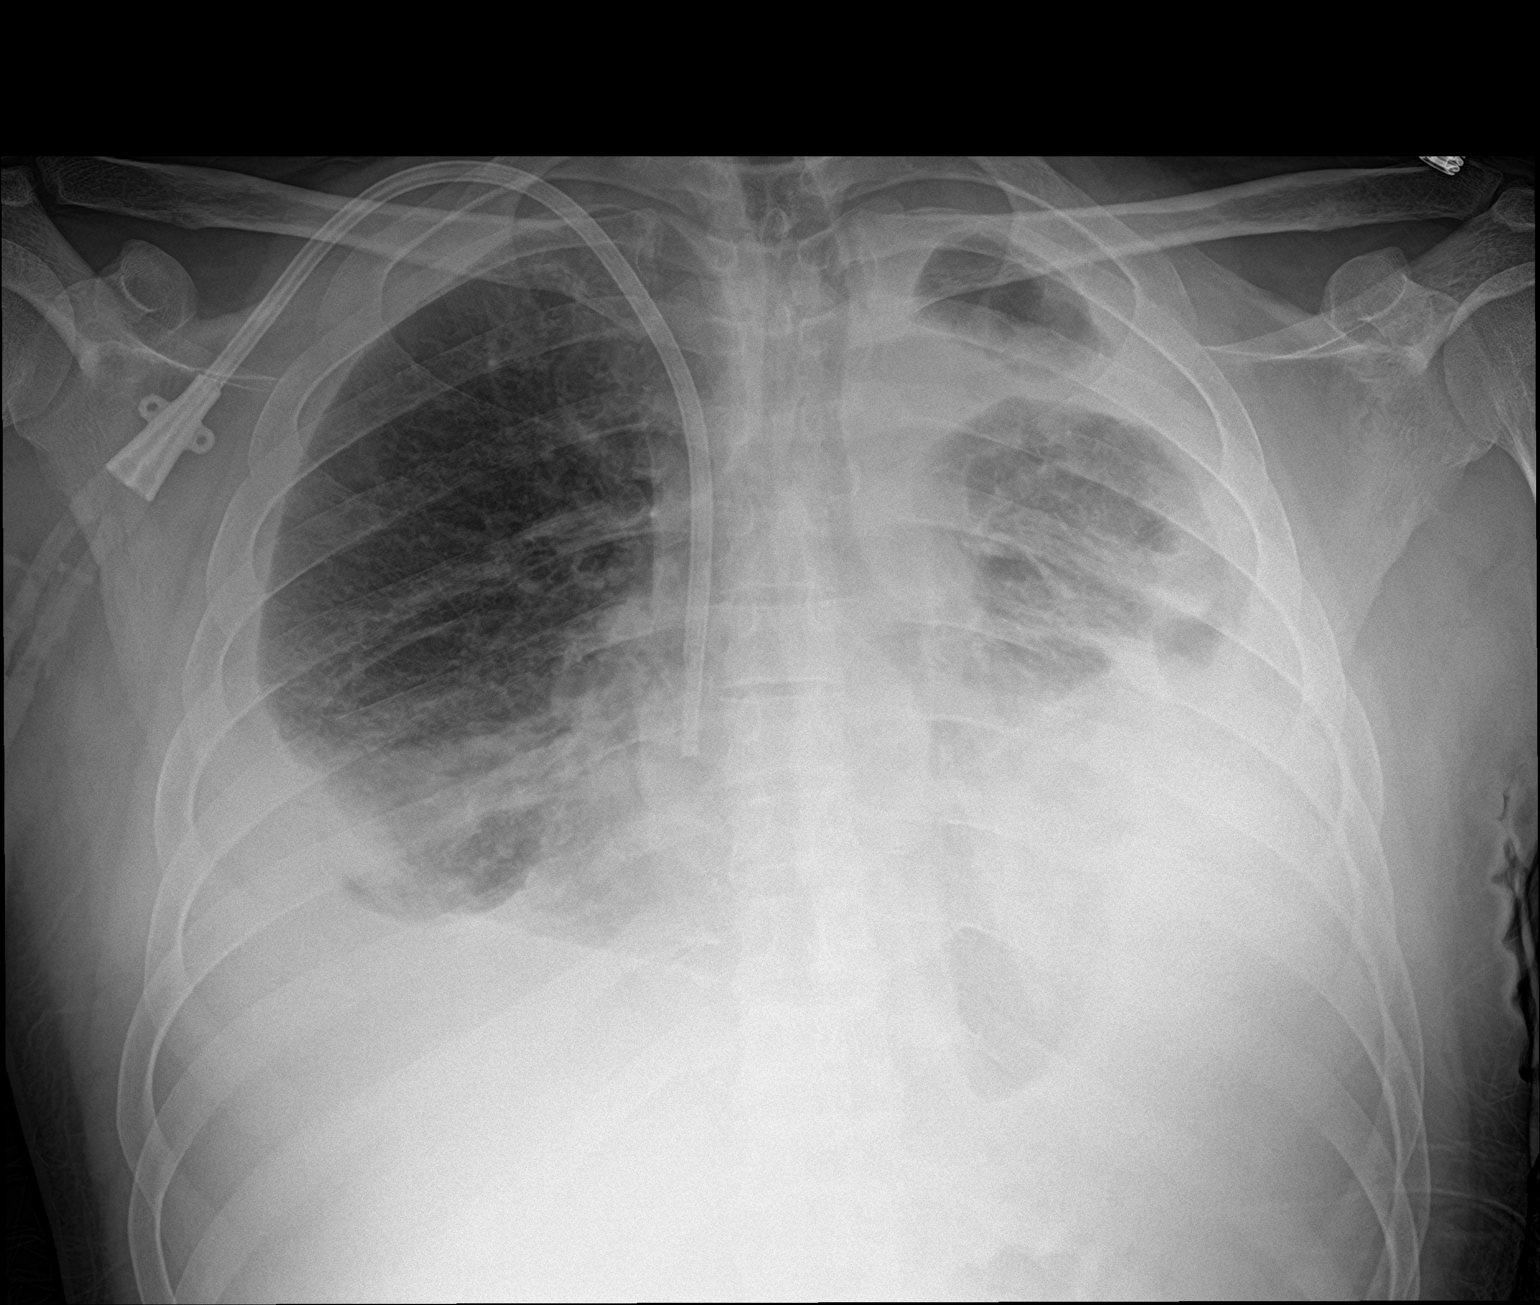

[2 of 2 positions shown; findings below may reference images not displayed]

FINDINGS: Large left and moderate right pleural effusions as before. Extensive
consolidation/atelectasis throughout much of the left lung, and the
right lung base. Stable tunneled right IJ hemodialysis catheter to
the cavoatrial junction.

Heart size difficult to assess due to adjacent opacities.

No pneumothorax.

Visualized bones unremarkable.
IMPRESSION: Stable bilateral effusions and parenchymal
consolidation/atelectasis, left worse than right.
